# Patient Record
Sex: Female | Born: 1950 | Race: White | Hispanic: No | Marital: Single | Smoking: Former smoker
Health system: Southern US, Community
[De-identification: ages and names within clinical notes are randomized; demographics above are authoritative.]

## PROBLEM LIST (undated history)

## (undated) DIAGNOSIS — R413 Other amnesia: Secondary | ICD-10-CM

## (undated) DIAGNOSIS — I671 Cerebral aneurysm, nonruptured: Secondary | ICD-10-CM

## (undated) DIAGNOSIS — I639 Cerebral infarction, unspecified: Secondary | ICD-10-CM

## (undated) DIAGNOSIS — R443 Hallucinations, unspecified: Secondary | ICD-10-CM

## (undated) DIAGNOSIS — N289 Disorder of kidney and ureter, unspecified: Secondary | ICD-10-CM

## (undated) DIAGNOSIS — E119 Type 2 diabetes mellitus without complications: Secondary | ICD-10-CM

## (undated) DIAGNOSIS — N185 Chronic kidney disease, stage 5: Secondary | ICD-10-CM

## (undated) DIAGNOSIS — N390 Urinary tract infection, site not specified: Secondary | ICD-10-CM

## (undated) HISTORY — PX: CHOLECYSTECTOMY: SHX55

## (undated) HISTORY — PX: SKIN GRAFT: SHX250

---

## 2019-07-20 ENCOUNTER — Emergency Department (HOSPITAL_COMMUNITY): Payer: Medicare Other

## 2019-07-20 ENCOUNTER — Observation Stay (HOSPITAL_COMMUNITY): Payer: Medicare Other

## 2019-07-20 ENCOUNTER — Encounter (HOSPITAL_COMMUNITY): Payer: Self-pay | Admitting: Emergency Medicine

## 2019-07-20 ENCOUNTER — Inpatient Hospital Stay (HOSPITAL_COMMUNITY)
Admission: EM | Admit: 2019-07-20 | Discharge: 2019-07-29 | DRG: 092 | Disposition: A | Discharge status: Skilled Nursing Facility | Payer: Medicare Other | Attending: Internal Medicine | Admitting: Internal Medicine

## 2019-07-20 ENCOUNTER — Other Ambulatory Visit: Payer: Self-pay

## 2019-07-20 DIAGNOSIS — S82401A Unspecified fracture of shaft of right fibula, initial encounter for closed fracture: Secondary | ICD-10-CM

## 2019-07-20 DIAGNOSIS — Z91018 Allergy to other foods: Secondary | ICD-10-CM

## 2019-07-20 DIAGNOSIS — E875 Hyperkalemia: Secondary | ICD-10-CM | POA: Diagnosis present

## 2019-07-20 DIAGNOSIS — W1830XA Fall on same level, unspecified, initial encounter: Secondary | ICD-10-CM | POA: Diagnosis present

## 2019-07-20 DIAGNOSIS — Z8673 Personal history of transient ischemic attack (TIA), and cerebral infarction without residual deficits: Secondary | ICD-10-CM

## 2019-07-20 DIAGNOSIS — S82492A Other fracture of shaft of left fibula, initial encounter for closed fracture: Secondary | ICD-10-CM

## 2019-07-20 DIAGNOSIS — W19XXXA Unspecified fall, initial encounter: Secondary | ICD-10-CM | POA: Diagnosis present

## 2019-07-20 DIAGNOSIS — F0390 Unspecified dementia without behavioral disturbance: Secondary | ICD-10-CM | POA: Diagnosis present

## 2019-07-20 DIAGNOSIS — S82402A Unspecified fracture of shaft of left fibula, initial encounter for closed fracture: Secondary | ICD-10-CM

## 2019-07-20 DIAGNOSIS — Z87891 Personal history of nicotine dependence: Secondary | ICD-10-CM

## 2019-07-20 DIAGNOSIS — Z993 Dependence on wheelchair: Secondary | ICD-10-CM

## 2019-07-20 DIAGNOSIS — N184 Chronic kidney disease, stage 4 (severe): Secondary | ICD-10-CM | POA: Diagnosis present

## 2019-07-20 DIAGNOSIS — E1122 Type 2 diabetes mellitus with diabetic chronic kidney disease: Secondary | ICD-10-CM | POA: Diagnosis present

## 2019-07-20 DIAGNOSIS — E1165 Type 2 diabetes mellitus with hyperglycemia: Secondary | ICD-10-CM | POA: Diagnosis present

## 2019-07-20 DIAGNOSIS — G92 Toxic encephalopathy: Principal | ICD-10-CM | POA: Diagnosis present

## 2019-07-20 DIAGNOSIS — R0902 Hypoxemia: Secondary | ICD-10-CM

## 2019-07-20 DIAGNOSIS — Z794 Long term (current) use of insulin: Secondary | ICD-10-CM

## 2019-07-20 DIAGNOSIS — N189 Chronic kidney disease, unspecified: Secondary | ICD-10-CM

## 2019-07-20 DIAGNOSIS — S82491A Other fracture of shaft of right fibula, initial encounter for closed fracture: Secondary | ICD-10-CM

## 2019-07-20 DIAGNOSIS — E119 Type 2 diabetes mellitus without complications: Secondary | ICD-10-CM

## 2019-07-20 DIAGNOSIS — S82831A Other fracture of upper and lower end of right fibula, initial encounter for closed fracture: Secondary | ICD-10-CM | POA: Diagnosis present

## 2019-07-20 DIAGNOSIS — I639 Cerebral infarction, unspecified: Secondary | ICD-10-CM

## 2019-07-20 DIAGNOSIS — I6930 Unspecified sequelae of cerebral infarction: Secondary | ICD-10-CM

## 2019-07-20 DIAGNOSIS — N183 Chronic kidney disease, stage 3 unspecified: Secondary | ICD-10-CM

## 2019-07-20 DIAGNOSIS — Z91012 Allergy to eggs: Secondary | ICD-10-CM

## 2019-07-20 DIAGNOSIS — N179 Acute kidney failure, unspecified: Secondary | ICD-10-CM | POA: Diagnosis present

## 2019-07-20 DIAGNOSIS — S93409A Sprain of unspecified ligament of unspecified ankle, initial encounter: Secondary | ICD-10-CM

## 2019-07-20 DIAGNOSIS — Z20828 Contact with and (suspected) exposure to other viral communicable diseases: Secondary | ICD-10-CM | POA: Diagnosis present

## 2019-07-20 DIAGNOSIS — Z91011 Allergy to milk products: Secondary | ICD-10-CM

## 2019-07-20 DIAGNOSIS — Z66 Do not resuscitate: Secondary | ICD-10-CM | POA: Diagnosis not present

## 2019-07-20 DIAGNOSIS — Z7902 Long term (current) use of antithrombotics/antiplatelets: Secondary | ICD-10-CM

## 2019-07-20 DIAGNOSIS — Z6836 Body mass index (BMI) 36.0-36.9, adult: Secondary | ICD-10-CM

## 2019-07-20 DIAGNOSIS — I671 Cerebral aneurysm, nonruptured: Secondary | ICD-10-CM

## 2019-07-20 DIAGNOSIS — E46 Unspecified protein-calorie malnutrition: Secondary | ICD-10-CM | POA: Diagnosis present

## 2019-07-20 DIAGNOSIS — Z880 Allergy status to penicillin: Secondary | ICD-10-CM

## 2019-07-20 DIAGNOSIS — Y92014 Private driveway to single-family (private) house as the place of occurrence of the external cause: Secondary | ICD-10-CM

## 2019-07-20 DIAGNOSIS — D631 Anemia in chronic kidney disease: Secondary | ICD-10-CM | POA: Diagnosis present

## 2019-07-20 DIAGNOSIS — Z833 Family history of diabetes mellitus: Secondary | ICD-10-CM

## 2019-07-20 DIAGNOSIS — S82435A Nondisplaced oblique fracture of shaft of left fibula, initial encounter for closed fracture: Secondary | ICD-10-CM | POA: Diagnosis present

## 2019-07-20 DIAGNOSIS — S92102A Unspecified fracture of left talus, initial encounter for closed fracture: Secondary | ICD-10-CM | POA: Diagnosis present

## 2019-07-20 DIAGNOSIS — Z515 Encounter for palliative care: Secondary | ICD-10-CM

## 2019-07-20 DIAGNOSIS — I69311 Memory deficit following cerebral infarction: Secondary | ICD-10-CM

## 2019-07-20 DIAGNOSIS — Z7982 Long term (current) use of aspirin: Secondary | ICD-10-CM

## 2019-07-20 DIAGNOSIS — G9340 Encephalopathy, unspecified: Secondary | ICD-10-CM | POA: Diagnosis present

## 2019-07-20 DIAGNOSIS — Z8249 Family history of ischemic heart disease and other diseases of the circulatory system: Secondary | ICD-10-CM

## 2019-07-20 DIAGNOSIS — R4182 Altered mental status, unspecified: Secondary | ICD-10-CM

## 2019-07-20 DIAGNOSIS — R443 Hallucinations, unspecified: Secondary | ICD-10-CM

## 2019-07-20 DIAGNOSIS — Z7189 Other specified counseling: Secondary | ICD-10-CM

## 2019-07-20 HISTORY — DX: Urinary tract infection, site not specified: N39.0

## 2019-07-20 HISTORY — DX: Cerebral infarction, unspecified: I63.9

## 2019-07-20 HISTORY — DX: Disorder of kidney and ureter, unspecified: N28.9

## 2019-07-20 HISTORY — DX: Type 2 diabetes mellitus without complications: E11.9

## 2019-07-20 HISTORY — DX: Hallucinations, unspecified: R44.3

## 2019-07-20 HISTORY — DX: Cerebral aneurysm, nonruptured: I67.1

## 2019-07-20 HISTORY — DX: Other amnesia: R41.3

## 2019-07-20 LAB — CBC WITH DIFFERENTIAL/PLATELET
Abs Immature Granulocytes: 0.09 10*3/uL — ABNORMAL HIGH (ref 0.00–0.07)
Basophils Absolute: 0.1 10*3/uL (ref 0.0–0.1)
Basophils Relative: 0 %
Eosinophils Absolute: 0.6 10*3/uL — ABNORMAL HIGH (ref 0.0–0.5)
Eosinophils Relative: 5 %
HCT: 33.7 % — ABNORMAL LOW (ref 36.0–46.0)
Hemoglobin: 10.5 g/dL — ABNORMAL LOW (ref 12.0–15.0)
Immature Granulocytes: 1 %
Lymphocytes Relative: 24 %
Lymphs Abs: 3.2 10*3/uL (ref 0.7–4.0)
MCH: 27.7 pg (ref 26.0–34.0)
MCHC: 31.2 g/dL (ref 30.0–36.0)
MCV: 88.9 fL (ref 80.0–100.0)
Monocytes Absolute: 1.1 10*3/uL — ABNORMAL HIGH (ref 0.1–1.0)
Monocytes Relative: 8 %
Neutro Abs: 8.3 10*3/uL — ABNORMAL HIGH (ref 1.7–7.7)
Neutrophils Relative %: 62 %
Platelets: 191 10*3/uL (ref 150–400)
RBC: 3.79 MIL/uL — ABNORMAL LOW (ref 3.87–5.11)
RDW: 14.1 % (ref 11.5–15.5)
WBC: 13.4 10*3/uL — ABNORMAL HIGH (ref 4.0–10.5)
nRBC: 0 % (ref 0.0–0.2)

## 2019-07-20 LAB — URINALYSIS, ROUTINE W REFLEX MICROSCOPIC
Bacteria, UA: NONE SEEN
Bilirubin Urine: NEGATIVE
Glucose, UA: NEGATIVE mg/dL
Ketones, ur: NEGATIVE mg/dL
Leukocytes,Ua: NEGATIVE
Nitrite: NEGATIVE
Protein, ur: NEGATIVE mg/dL
Specific Gravity, Urine: 1.009 (ref 1.005–1.030)
pH: 6 (ref 5.0–8.0)

## 2019-07-20 LAB — BRAIN NATRIURETIC PEPTIDE: B Natriuretic Peptide: 109 pg/mL — ABNORMAL HIGH (ref 0.0–100.0)

## 2019-07-20 LAB — COMPREHENSIVE METABOLIC PANEL
ALT: 19 U/L (ref 0–44)
AST: 20 U/L (ref 15–41)
Albumin: 3.3 g/dL — ABNORMAL LOW (ref 3.5–5.0)
Alkaline Phosphatase: 82 U/L (ref 38–126)
Anion gap: 10 (ref 5–15)
BUN: 63 mg/dL — ABNORMAL HIGH (ref 8–23)
CO2: 26 mmol/L (ref 22–32)
Calcium: 8.2 mg/dL — ABNORMAL LOW (ref 8.9–10.3)
Chloride: 104 mmol/L (ref 98–111)
Creatinine, Ser: 3.26 mg/dL — ABNORMAL HIGH (ref 0.44–1.00)
GFR calc Af Amer: 16 mL/min — ABNORMAL LOW (ref >60)
GFR calc non Af Amer: 14 mL/min — ABNORMAL LOW (ref >60)
Glucose, Bld: 192 mg/dL — ABNORMAL HIGH (ref 70–99)
Potassium: 6 mmol/L — ABNORMAL HIGH (ref 3.5–5.1)
Sodium: 140 mmol/L (ref 135–145)
Total Bilirubin: 0.6 mg/dL (ref 0.3–1.2)
Total Protein: 6.4 g/dL — ABNORMAL LOW (ref 6.5–8.1)

## 2019-07-20 LAB — CK: Total CK: 271 U/L — ABNORMAL HIGH (ref 38–234)

## 2019-07-20 LAB — POTASSIUM: Potassium: 5.7 mmol/L — ABNORMAL HIGH (ref 3.5–5.1)

## 2019-07-20 LAB — TROPONIN I (HIGH SENSITIVITY)
Troponin I (High Sensitivity): 4 ng/L (ref <18)
Troponin I (High Sensitivity): 4 ng/L (ref <18)

## 2019-07-20 LAB — SARS CORONAVIRUS 2 BY RT PCR (HOSPITAL ORDER, PERFORMED IN ~~LOC~~ HOSPITAL LAB): SARS Coronavirus 2: NEGATIVE

## 2019-07-20 LAB — CBG MONITORING, ED: Glucose-Capillary: 131 mg/dL — ABNORMAL HIGH (ref 70–99)

## 2019-07-20 MED ORDER — CRANBERRY-VITAMIN C-VITAMIN E 4200-20-3 MG-MG-UNIT PO CAPS: 1.0000 | ORAL_CAPSULE | Freq: Every day | ORAL | Status: DC | PRN

## 2019-07-20 MED ORDER — ACETAMINOPHEN 650 MG RE SUPP: 650.0000 mg | Freq: Four times a day (QID) | RECTAL | Status: DC | PRN

## 2019-07-20 MED ORDER — LORATADINE 10 MG PO TABS
10.0000 mg | ORAL_TABLET | Freq: Every morning | ORAL | Status: DC
  Administered 2019-07-21 – 2019-07-28 (×6): 10 mg via ORAL
  Filled 2019-07-20 (×9): qty 1

## 2019-07-20 MED ORDER — ASPIRIN EC 81 MG PO TBEC
81.0000 mg | DELAYED_RELEASE_TABLET | Freq: Every morning | ORAL | Status: DC
  Administered 2019-07-21 – 2019-07-24 (×3): 81 mg via ORAL
  Filled 2019-07-20 (×5): qty 1

## 2019-07-20 MED ORDER — POLYETHYLENE GLYCOL 3350 17 G PO PACK: 17.0000 g | PACK | Freq: Every day | ORAL | Status: DC | PRN

## 2019-07-20 MED ORDER — DEXTROSE 50 % IV SOLN
INTRAVENOUS | Status: AC
  Administered 2019-07-20: 19:00:00 via INTRAVENOUS
  Filled 2019-07-20: qty 50

## 2019-07-20 MED ORDER — DOXYCYCLINE HYCLATE 100 MG PO TABS
100.0000 mg | ORAL_TABLET | Freq: Every morning | ORAL | Status: DC
  Administered 2019-07-21 – 2019-07-28 (×6): 100 mg via ORAL
  Filled 2019-07-20 (×9): qty 1

## 2019-07-20 MED ORDER — ENOXAPARIN SODIUM 60 MG/0.6ML ~~LOC~~ SOLN
50.0000 mg | SUBCUTANEOUS | Status: DC
  Administered 2019-07-21: 50 mg via SUBCUTANEOUS
  Filled 2019-07-20: qty 0.6

## 2019-07-20 MED ORDER — METOPROLOL SUCCINATE ER 50 MG PO TB24
200.0000 mg | ORAL_TABLET | Freq: Every morning | ORAL | Status: DC
  Administered 2019-07-21 – 2019-07-28 (×6): 200 mg via ORAL
  Filled 2019-07-20 (×10): qty 4

## 2019-07-20 MED ORDER — CLONIDINE HCL 0.2 MG/24HR TD PTWK
0.2000 mg | MEDICATED_PATCH | TRANSDERMAL | Status: DC
  Filled 2019-07-20 (×2): qty 1

## 2019-07-20 MED ORDER — MORPHINE SULFATE (PF) 2 MG/ML IV SOLN
2.0000 mg | Freq: Four times a day (QID) | INTRAVENOUS | Status: DC | PRN
  Administered 2019-07-21 – 2019-07-22 (×2): 2 mg via INTRAVENOUS
  Filled 2019-07-20 (×2): qty 1

## 2019-07-20 MED ORDER — DEXTROSE 50 % IV SOLN
50.0000 mL | Freq: Once | INTRAVENOUS | Status: AC
  Administered 2019-07-20: 18:00:00 50 mL via INTRAVENOUS
  Administered 2019-07-20: 19:00:00 via INTRAVENOUS

## 2019-07-20 MED ORDER — INSULIN GLARGINE 100 UNIT/ML ~~LOC~~ SOLN
25.0000 [IU] | Freq: Every day | SUBCUTANEOUS | Status: DC
  Administered 2019-07-21: 25 [IU] via SUBCUTANEOUS
  Filled 2019-07-20 (×4): qty 0.25

## 2019-07-20 MED ORDER — CLOPIDOGREL BISULFATE 75 MG PO TABS
75.0000 mg | ORAL_TABLET | Freq: Every morning | ORAL | Status: DC
  Administered 2019-07-21 – 2019-07-24 (×3): 75 mg via ORAL
  Filled 2019-07-20 (×5): qty 1

## 2019-07-20 MED ORDER — INSULIN ASPART 100 UNIT/ML ~~LOC~~ SOLN
0.0000 [IU] | Freq: Three times a day (TID) | SUBCUTANEOUS | Status: DC
  Administered 2019-07-22 (×2): 2 [IU] via SUBCUTANEOUS
  Administered 2019-07-22: 17:00:00 5 [IU] via SUBCUTANEOUS
  Administered 2019-07-24: 10:00:00 3 [IU] via SUBCUTANEOUS
  Administered 2019-07-25: 12:00:00 11 [IU] via SUBCUTANEOUS
  Administered 2019-07-25 – 2019-07-26 (×2): 5 [IU] via SUBCUTANEOUS
  Administered 2019-07-26: 08:00:00 8 [IU] via SUBCUTANEOUS
  Administered 2019-07-26: 13:00:00 5 [IU] via SUBCUTANEOUS
  Administered 2019-07-27: 12:00:00 3 [IU] via SUBCUTANEOUS
  Administered 2019-07-27: 08:00:00 5 [IU] via SUBCUTANEOUS
  Administered 2019-07-27 – 2019-07-28 (×3): 3 [IU] via SUBCUTANEOUS
  Administered 2019-07-29 (×2): 2 [IU] via SUBCUTANEOUS

## 2019-07-20 MED ORDER — SODIUM ZIRCONIUM CYCLOSILICATE 5 G PO PACK
10.0000 g | PACK | Freq: Once | ORAL | Status: AC
  Administered 2019-07-20: 10 g via ORAL
  Filled 2019-07-20: qty 2

## 2019-07-20 MED ORDER — INSULIN ASPART 100 UNIT/ML ~~LOC~~ SOLN
10.0000 [IU] | Freq: Once | SUBCUTANEOUS | Status: AC
  Administered 2019-07-20: 19:00:00 10 [IU] via SUBCUTANEOUS
  Filled 2019-07-20: qty 1

## 2019-07-20 MED ORDER — SODIUM CHLORIDE 0.9 % IV SOLN
INTRAVENOUS | Status: AC
  Administered 2019-07-20: 19:00:00 via INTRAVENOUS

## 2019-07-20 MED ORDER — ATORVASTATIN CALCIUM 40 MG PO TABS
40.0000 mg | ORAL_TABLET | Freq: Every day | ORAL | Status: DC
  Administered 2019-07-21 – 2019-07-28 (×6): 40 mg via ORAL
  Filled 2019-07-20 (×8): qty 1

## 2019-07-20 MED ORDER — PANTOPRAZOLE SODIUM 40 MG PO TBEC
40.0000 mg | DELAYED_RELEASE_TABLET | Freq: Every day | ORAL | Status: DC
  Administered 2019-07-21 – 2019-07-28 (×6): 40 mg via ORAL
  Filled 2019-07-20 (×9): qty 1

## 2019-07-20 MED ORDER — ONDANSETRON HCL 4 MG/2ML IJ SOLN
4.0000 mg | Freq: Four times a day (QID) | INTRAMUSCULAR | Status: DC | PRN
  Administered 2019-07-25 – 2019-07-27 (×2): 4 mg via INTRAVENOUS
  Filled 2019-07-20 (×2): qty 2

## 2019-07-20 MED ORDER — ISOSORBIDE MONONITRATE ER 60 MG PO TB24
30.0000 mg | ORAL_TABLET | Freq: Every morning | ORAL | Status: DC
  Administered 2019-07-21 – 2019-07-28 (×6): 30 mg via ORAL
  Filled 2019-07-20 (×9): qty 1

## 2019-07-20 MED ORDER — LINAGLIPTIN 5 MG PO TABS
5.0000 mg | ORAL_TABLET | Freq: Every morning | ORAL | Status: DC
  Administered 2019-07-21 – 2019-07-28 (×6): 5 mg via ORAL
  Filled 2019-07-20 (×10): qty 1

## 2019-07-20 MED ORDER — ACETAMINOPHEN 325 MG PO TABS
650.0000 mg | ORAL_TABLET | Freq: Four times a day (QID) | ORAL | Status: DC | PRN
  Administered 2019-07-26: 650 mg via ORAL
  Filled 2019-07-20 (×2): qty 2

## 2019-07-20 MED ORDER — CALCIUM GLUCONATE-NACL 1-0.675 GM/50ML-% IV SOLN
1.0000 g | Freq: Once | INTRAVENOUS | Status: AC
  Administered 2019-07-20: 19:00:00 1000 mg via INTRAVENOUS
  Filled 2019-07-20: qty 50

## 2019-07-20 MED ORDER — ONDANSETRON HCL 4 MG PO TABS: 4.0000 mg | ORAL_TABLET | Freq: Four times a day (QID) | ORAL | Status: DC | PRN

## 2019-07-20 NOTE — ED Provider Notes (Signed)
[facility] Provider Note   CSN: [id number] Arrival date & time: 07/20/19  1318     History   Chief Complaint Chief Complaint  Patient presents with  . Fall    HPI Victoria Winters is a 68 y.o. female.     The history is provided by the patient and a relative. No language interpreter was used.  Fall This is a new problem. The current episode started yesterday. The problem occurs constantly. The problem has been gradually worsening. Pertinent negatives include no headaches. Nothing aggravates the symptoms. Nothing relieves the symptoms. She has tried nothing for the symptoms. The treatment provided no relief.  Pt's sister is caregiver.  Pt has had a stroke and has short term memory loss. Pt fell yesterday.  Pt has been more confused,  Talking less since fall,   Past Medical History:  Diagnosis Date  . Brain aneurysm   . Diabetes mellitus without complication ([place])   . Hallucinations    visual and auditory  . Memory loss   . Recurrent UTI   . Renal disorder   . Stroke [facility])     There are no active problems to display for this patient.   Past Surgical History:  Procedure Laterality Date  . CHOLECYSTECTOMY    . SKIN GRAFT       OB History    Gravida  2   Para  2   Term  2   Preterm      AB      Living        SAB      TAB      Ectopic      Multiple      Live Births               Home Medications    Prior to Admission medications   Not on File    Family History Family History  Problem Relation Age of Onset  . Heart disease Mother   . Diabetes Mellitus II Mother   . Heart disease Father   . Diabetes Father   . Heart disease Brother   . Cancer Other     Social History Social History   Tobacco Use  . Smoking status: Former Research scientist (life sciences)  . Smokeless tobacco: Never Used  Substance Use Topics  . Alcohol use: Yes    Frequency: Never    Comment: rarely  . Drug use: Never     Allergies   Penicillins, Chicken  allergy, Eggs or egg-derived products, Milk-related compounds, Other, and Wheat bran   Review of Systems Review of Systems  Musculoskeletal: Positive for joint swelling and myalgias.  Neurological: Negative for headaches.  All other systems reviewed and are negative.    Physical Exam Updated Vital Signs BP (!) 112/51 (BP Location: Right Arm)   Pulse 66   Temp 98 F (36.7 C) (Oral)   Resp 18   SpO2 90%   Physical Exam Vitals signs and nursing note reviewed.  HENT:     Head: Normocephalic.     Right Ear: Tympanic membrane normal.     Left Ear: Tympanic membrane normal.     Mouth/Throat:     Mouth: Mucous membranes are moist.  Eyes:     Pupils: Pupils are equal, round, and reactive to light.  Cardiovascular:     Rate and Rhythm: Normal rate.     Pulses: Normal pulses.  Pulmonary:     Effort: Pulmonary effort is normal.  Abdominal:  General: Abdomen is flat.  Musculoskeletal:        General: Swelling and tenderness present.     Comments: Tender bilat knees, pain with movement   Skin:    General: Skin is warm.  Neurological:     General: No focal deficit present.     Mental Status: She is alert.  Psychiatric:        Mood and Affect: Mood normal.      ED Treatments / Results  Labs (all labs ordered are listed, but only abnormal results are displayed) Labs Reviewed  CBC WITH DIFFERENTIAL/PLATELET - Abnormal; Notable for the following components:      Result Value   WBC 13.4 (*)    RBC 3.79 (*)    Hemoglobin 10.5 (*)    HCT 33.7 (*)    Neutro Abs 8.3 (*)    Monocytes Absolute 1.1 (*)    Eosinophils Absolute 0.6 (*)    Abs Immature Granulocytes 0.09 (*)    All other components within normal limits  COMPREHENSIVE METABOLIC PANEL - Abnormal; Notable for the following components:   Potassium 6.0 (*)    Glucose, Bld 192 (*)    BUN 63 (*)    Creatinine, Ser 3.26 (*)    Calcium 8.2 (*)    Total Protein 6.4 (*)    Albumin 3.3 (*)    GFR calc non Af Amer 14  (*)    GFR calc Af Amer 16 (*)    All other components within normal limits  URINALYSIS, ROUTINE W REFLEX MICROSCOPIC  TROPONIN I (HIGH SENSITIVITY)    EKG None  Radiology No results found.  Procedures Procedures (including critical care time)  Medications Ordered in ED Medications - No data to display   Initial Impression / Assessment and Plan / ED Course  I have reviewed the triage vital signs and the nursing notes.  Pertinent labs & imaging results that were available during my care of the patient were reviewed by me and considered in my medical decision making (see chart for details).        MDM  I will xray pt knee and obtain ct scan.  Labs ordered. Pt has bilat fibula fractures.  BUN, Creat and potassium are elevated.   Ct shows chronic changes, no changes Right fibula neck fracture  Left fibula shaft fracture. I counseled sister on results.  She reports pt seems even more spacey  Sister reports she can not lift pt at home.  I spoke to Dr. Denton Brick who will see for admissionn  Final Clinical Impressions(s) / ED Diagnoses   Final diagnoses:  Hyperkalemia  AKI (acute kidney injury) ([place])  Other closed fracture of shaft of right fibula, initial encounter  Other closed fracture of shaft of left fibula, initial encounter  Altered mental status, unspecified altered mental status type    ED Discharge Orders    None       Fransico Meadow, [place] 07/20/19 1749    Lucrezia Starch, MD 07/21/19 [phone]

## 2019-07-20 NOTE — H&P (Addendum)
History and Physical    Victoria Winters [id number] DOB: 10-Jan-1951 DOA: 07/20/2019  PCP: [facility]   Patient coming from: Home  I have personally briefly reviewed patient's old medical records in [place]  Chief Complaint: Fall, altered mental status  HPI: Victoria Winters is a 68 y.o. female with medical history significant for CVA with residual deficits, diabetes mellitus, brain aneurysms, hallucinations, frequent UTIs, CKD 4-5.  History is obtained with the help of sister-Victoria Winters who is healthcare power of attorney.  The patient is awake and alert, has short-term memory loss due to her stroke and is unable to give me history, but answers simple questions.  Patient lives with her sister. Over the past 4 days, patient sister reports increased shakiness, patient sometimes not responding to questions, not as lucid as previously or as talkative.  Patient also reported blurred pain with urination 4 days ago.   Yesterday while patient was using her walker and trying to go up the ramp into the house, patient became tremulous and fell.  Since fall patient has not been able to ambulate.  At baseline patient ambulates with walker and sometimes wheelchair. No vomiting, no loose stools, patient admitted good p.o. intake, no difficulty breathing, no cough no chest pain, no fever no chills..  Sister reports wound to patient's buttock area.  Patient sister also reports patient has an appointment (1st) to see a nephrologist but because of [employer], her  appointment date and change several times and is now in September.  ED Course: 89 to 90% on room air.  Creatinine 3.26, potassium 6.  WBC 13.4.  Right knee xray-slight impacted fracture fibular neck, left knee x-ray proximal shaft fibular fracture..  Head CT shows remote infarct with volume loss and chronic white matter changes.  EKG showed sinus rhythm.  Hospitalist was called to admit for falls and hyperkalemia.  Review of  Systems: As per HPI all other systems reviewed and negative.  Past Medical History:  Diagnosis Date   Brain aneurysm    Diabetes mellitus without complication ([place])    Hallucinations    visual and auditory   Memory loss    Recurrent UTI    Renal disorder    Stroke [facility])     Past Surgical History:  Procedure Laterality Date   CHOLECYSTECTOMY     SKIN GRAFT       reports that she has quit smoking. She has never used smokeless tobacco. She reports current alcohol use. She reports that she does not use drugs.  Allergies  Allergen Reactions   Penicillins Anaphylaxis    Did it involve swelling of the face/tongue/throat, SOB, or low BP? Yes Did it involve sudden or severe rash/hives, skin peeling, or any reaction on the inside of your mouth or nose? Unknown Did you need to seek medical attention at a hospital or doctor's office? Unknown When did it last happen?Within past 10 years If all above answers are "NO", may proceed with cephalosporin use.    Chicken Allergy    Eggs Or Egg-Derived Products Swelling   Milk-Related Compounds    Other     Black beans, cats, dogs, grass clippings   Wheat Bran     Family History  Problem Relation Age of Onset   Heart disease Mother    Diabetes Mellitus II Mother    Heart disease Father    Diabetes Father    Heart disease Brother    Cancer Other     Prior  to Admission medications   Medication Sig Start Date End Date Taking? Authorizing Provider  acetaminophen-codeine (TYLENOL #4) 300-60 MG tablet Take 1 tablet by mouth every 4 (four) hours as needed for moderate pain.   Yes [provider]  aspirin EC 81 MG tablet Take 81 mg by mouth every morning.   Yes [provider]  atorvastatin (LIPITOR) 40 MG tablet Take 40 mg by mouth at bedtime.   Yes [provider]  cloNIDine (CATAPRES - DOSED IN MG/24 HR) 0.2 mg/24hr patch Place 0.2 mg onto the skin every Sunday.   Yes [provider]  clopidogrel (PLAVIX) 75 MG tablet Take 75 mg by mouth every morning.   Yes [provider]  Cranberry-Vitamin C-Vitamin E (CRANBERRY PLUS VITAMIN C) 4200-20-3 MG-MG-UNIT CAPS Take 1 capsule by mouth daily as needed (urinary tract health).   Yes [provider]  doxycycline (VIBRA-TABS) 100 MG tablet Take 100 mg by mouth every morning.   Yes [provider]  escitalopram (LEXAPRO) 10 MG tablet Take 10 mg by mouth every morning.   Yes [provider]  furosemide (LASIX) 40 MG tablet Take 40 mg by mouth every morning.   Yes [provider]  hydrALAZINE (APRESOLINE) 25 MG tablet Take 25 mg by mouth 3 (three) times daily.   Yes [provider]  insulin glargine (LANTUS) 100 UNIT/ML injection Inject 30 Units into the skin at bedtime.   Yes [provider]  insulin lispro (HUMALOG) 100 UNIT/ML injection Inject 1-17 Units into the skin 3 (three) times daily before meals. Sliding scale starting at; 180-220= 4 units 221-260= 6 units 261-300= 8 units 301-340=10units 341-380=12units   Yes [provider]  isosorbide mononitrate (IMDUR) 30 MG 24 hr tablet Take 30 mg by mouth every morning.   Yes [provider]  linagliptin (TRADJENTA) 5 MG TABS tablet Take 5 mg by mouth every morning.   Yes [provider]  loratadine (CLARITIN) 10 MG tablet Take 10 mg by mouth every morning.   Yes [provider]  Melatonin 1 MG TABS Take 1 mg by mouth at bedtime.   Yes [provider]  metoprolol (TOPROL-XL) 200 MG 24 hr tablet Take 200 mg by mouth every morning.   Yes [provider]  Multiple Vitamin (MULTIVITAMIN WITH MINERALS) TABS tablet Take 1 tablet by mouth daily.   Yes [provider]  omeprazole (PRILOSEC) 20 MG capsule Take 20 mg by mouth every morning.   Yes [provider]  pregabalin (LYRICA) 75 MG capsule Take 75 mg by mouth 2 (two) times daily.   Yes [provider]    Physical Exam: Vitals:   07/20/19 1330 07/20/19 1331  BP:  (!) 112/51  Pulse:  66  Resp:  18  Temp:  98 F (36.7 C)  TempSrc:  Oral  SpO2: (!) 89% 90%    Constitutional: NAD, calm, comfortable Vitals:   07/20/19 1330 07/20/19 1331  BP:  (!) 112/51  Pulse:  66  Resp:  18  Temp:  98 F (36.7 C)  TempSrc:  Oral  SpO2: (!) 89% 90%   Eyes: PERRL, lids and conjunctivae normal ENMT: Mucous membranes are moist. Posterior pharynx clear of any exudate or lesions.  Neck: normal, supple, no masses, no thyromegaly Respiratory: clear to auscultation bilaterally, no wheezing, no crackles. Normal respiratory effort. No accessory muscle use.  Cardiovascular: Regular rate and rhythm, no murmurs / rubs / gallops. No extremity edema. 2+ pedal pulses.  Abdomen: Protuberant, but nontender, per sister this is her baseline, no masses palpated. No hepatosplenomegaly. Bowel sounds positive.  On my limited examination, minimal erythema on right and left buttock. Musculoskeletal: no clubbing / cyanosis. No joint deformity upper and lower extremities. Good ROM, no contractures. Normal muscle tone.  Skin: Abrasion to right knee, no rashes, lesions, ulcers. No induration Neurologic: CN 2-12 grossly intact. Strength 5/5 lateral upper extremity, lower extremity testing limited by bilateral fibular fractures.  But able to move her foot slightly bilaterally Psychiatric: Normal judgment and insight. Alert and oriented x 3. Normal mood.   Labs on Admission: I have personally reviewed following labs and imaging studies  CBC: Recent Labs  Lab 07/20/19 1353  WBC 13.4*  NEUTROABS 8.3*  HGB 10.5*  HCT 33.7*  MCV 88.9  PLT 99991111   Basic Metabolic Panel: Recent Labs  Lab 07/20/19 1353  NA 140  K 6.0*  CL 104  CO2 26  GLUCOSE 192*  BUN 63*  CREATININE 3.26*  CALCIUM 8.2*   Liver Function Tests: Recent Labs  Lab 07/20/19 1353  AST 20  ALT 19  ALKPHOS 82  BILITOT 0.6  PROT  6.4*  ALBUMIN 3.3*   Urine analysis:    Component Value Date/Time   COLORURINE YELLOW 07/20/2019 1353   APPEARANCEUR HAZY (A) 07/20/2019 1353   LABSPEC 1.009 07/20/2019 1353   PHURINE 6.0 07/20/2019 1353   GLUCOSEU NEGATIVE 07/20/2019 1353   HGBUR MODERATE (A) 07/20/2019 1353   BILIRUBINUR NEGATIVE 07/20/2019 1353   KETONESUR NEGATIVE 07/20/2019 1353   PROTEINUR NEGATIVE 07/20/2019 1353   NITRITE NEGATIVE 07/20/2019 1353   LEUKOCYTESUR NEGATIVE 07/20/2019 1353    Radiological Exams on Admission: Ct Head Wo Contrast  Result Date: 07/20/2019 CLINICAL DATA:  Ataxia, fall, history of strokes EXAM: CT HEAD WITHOUT CONTRAST TECHNIQUE: Contiguous axial images were obtained from the base of the skull through the vertex without intravenous contrast. COMPARISON:  None. FINDINGS: Brain: Focus of gliosis in the left cerebellar hemisphere likely reflecting remote infarct no evidence of acute infarction, hemorrhage, hydrocephalus, extra-axial collection or mass lesion/mass effect. Symmetric prominence of the ventricles, cisterns and sulci compatible with parenchymal volume loss. Patchy areas of white matter hypoattenuation are most compatible with chronic microvascular angiopathy. Vascular: Atherosclerotic calcification of the carotid siphons. Skull: Hyperostosis frontalis interna. No calvarial fracture or suspicious osseous lesion. No scalp swelling or hematoma. Sinuses/Orbits: Mural thickening in the right frontal sinus and anterior right ethmoid air cells. Orbital structures are unremarkable aside from prior lens extractions. Other: None. IMPRESSION: No acute intracranial abnormality. Region of gliosis in the left cerebellar hemisphere, likely remote infarct. Volume loss and chronic white matter changes. Electronically Signed   By: Lovena Le M.D.   On: 07/20/2019 16:31   Dg Knee Complete 4 Views Left  Result Date: 07/20/2019 CLINICAL DATA:  Fall with knee pain EXAM: LEFT KNEE - COMPLETE 4+ VIEW  COMPARISON:  None. FINDINGS: Mild degenerative change of the medial joint space. Mild patellofemoral degenerative change. Small knee effusion. Acute minimally displaced fracture involving the proximal shaft of the fibula. IMPRESSION: 1. Acute minimally displaced fracture proximal shaft of fibula 2. Mild degenerative changes of the knee.  Small knee effusion Electronically Signed   By: Donavan Foil M.D.   On: 07/20/2019 15:23   Dg Knee Complete 4 Views Right  Result Date: 07/20/2019 CLINICAL DATA:  Fall EXAM: RIGHT KNEE - COMPLETE 4+ VIEW COMPARISON:  None. FINDINGS: Possible acute, subtle slightly impacted fracture at the fibular neck. No  dislocation. Mild patellofemoral and lateral joint space degenerative change. Trace knee effusion IMPRESSION: 1. Findings questionable for slightly impacted fracture at the fibular neck 2. Mild degenerative changes with trace knee effusion Electronically Signed   By: Donavan Foil M.D.   On: 07/20/2019 15:22    EKG: Independently reviewed.  Sinus rhythm, rate 67, QTc 453.  T wave abnormalities lead III, V2 and V3.  Assessment/Plan Active Problems:   Fall    Mechanical fall- at baseline ambulates with walker or wheelchair, residual CVA deficits.  Head CT negative for acute abnormality. -PT evaluation -Obtain pelvic x-ray-no acute fracture or dislocation.  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  Sister reports recently told patient's renal function is between 4 and 5.  Home medications Lasix 40 mg daily.  No GI blood loss history, appears euvolemic.  Denies NSAID use.  Has appointment with nephrology as outpatient. - Trial of IV fluids N/s 100cc/hr x 15hrs - Hold home Lasix - Obtain CK  - BMP a.m. - Ca gluconate x1 - NovoLog 10 units, D50 X 1 - Lokelma 10 mg x 1  Fibular fractures- s/p mechanical fall.  Left Knee xray- Acute minimally displaced proximal fibula fracture of the shaft, Right Knee xray-  questionable slightly impacted fracture at the fibular neck.  At baseline ambulates with walker or wheelchair. - Morphine 2mg  PRN - Ortho recommendations- recommends getting bilateral tibia-fibula x-rays as he cannot tell much from the knee x-rays.  Imaging, there is no acute osseous abnormality on the right.  But the left shows a nondisplaced oblique fracture of the proximal fibular diaphysis.  Called back by Dr. Lyla Glassing after imaging results- no surgical intervention. -Please consult Ortho in a.m. for discharge recommendations as per weightbearing.  Metabolic encephalopathy- Sister reports patient not as lucid, or verbal.  On my evaluation patient answers simple questions appropriately.  Head CT without acute abnormality.  Reported dysuria, but UA is not suggestive of infection.  Leukocytosis of 13.4.  She does have a mild hypoxia O2 sats 89 to 94% on room air, without respiratory symptoms. -Portable chest x-ray-cardiomegaly with vascular congestion -Check BNP -PRN morphine -Will hold off on home psychoactive medications-Lexapro and Lyrica.  Diabetes mellitus-random glucose 192. -Resume home Lantus at reduced dose 25 units daily -Resume home Tradjenta - SSI  Hx of CVA, brain aneurysm, history of hallucinations- history of brain aneurysm not amenable to surgery.  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  Intermittent visual and auditory hallucinations. -Resume home statins, aspirin and Plavix.  Hypertension-blood pressure systolic [id number] - [id number].. -Resume home Imdur, metoprolol, clonidine patch 0.2 mg. -Hold hydralazine, lasix for now.  HIV as part of routine health screening   DVT prophylaxis: SCDs Code Status: Full Family Communication: Sister Victoria Winters at bedside who is healthcare power of attorney. Disposition Plan: Per rounding team Consults called: None Admission status: Observation, telemetry   Hecker MD [facility]  07/20/2019, 9:19 PM

## 2019-07-20 NOTE — ED Triage Notes (Signed)
Reports patient has had some mental status changes over the past couple of days. Fell yesterday, injuring her legs. Abrasion to her R knee. Patient having difficulty standing, unable to ambulate. C/O bilat leg pain. Difficulty getting in w/c and getting out of bed.

## 2019-07-21 ENCOUNTER — Inpatient Hospital Stay (HOSPITAL_COMMUNITY)
Admit: 2019-07-21 | Discharge: 2019-07-21 | Disposition: A | Discharge status: Home / Self Care | Payer: Medicare Other | Attending: Internal Medicine | Admitting: Internal Medicine

## 2019-07-21 ENCOUNTER — Observation Stay (HOSPITAL_COMMUNITY): Payer: Medicare Other

## 2019-07-21 ENCOUNTER — Other Ambulatory Visit: Payer: Self-pay

## 2019-07-21 DIAGNOSIS — S82831A Other fracture of upper and lower end of right fibula, initial encounter for closed fracture: Secondary | ICD-10-CM | POA: Diagnosis present

## 2019-07-21 DIAGNOSIS — E1165 Type 2 diabetes mellitus with hyperglycemia: Secondary | ICD-10-CM | POA: Diagnosis present

## 2019-07-21 DIAGNOSIS — E875 Hyperkalemia: Secondary | ICD-10-CM | POA: Diagnosis present

## 2019-07-21 DIAGNOSIS — Z87891 Personal history of nicotine dependence: Secondary | ICD-10-CM | POA: Diagnosis not present

## 2019-07-21 DIAGNOSIS — R4182 Altered mental status, unspecified: Secondary | ICD-10-CM | POA: Diagnosis present

## 2019-07-21 DIAGNOSIS — W1830XA Fall on same level, unspecified, initial encounter: Secondary | ICD-10-CM | POA: Diagnosis present

## 2019-07-21 DIAGNOSIS — Z91011 Allergy to milk products: Secondary | ICD-10-CM | POA: Diagnosis not present

## 2019-07-21 DIAGNOSIS — Z8673 Personal history of transient ischemic attack (TIA), and cerebral infarction without residual deficits: Secondary | ICD-10-CM | POA: Diagnosis not present

## 2019-07-21 DIAGNOSIS — Z91012 Allergy to eggs: Secondary | ICD-10-CM | POA: Diagnosis not present

## 2019-07-21 DIAGNOSIS — Y92014 Private driveway to single-family (private) house as the place of occurrence of the external cause: Secondary | ICD-10-CM | POA: Diagnosis not present

## 2019-07-21 DIAGNOSIS — G9340 Encephalopathy, unspecified: Secondary | ICD-10-CM | POA: Diagnosis not present

## 2019-07-21 DIAGNOSIS — I6930 Unspecified sequelae of cerebral infarction: Secondary | ICD-10-CM | POA: Diagnosis not present

## 2019-07-21 DIAGNOSIS — Z515 Encounter for palliative care: Secondary | ICD-10-CM | POA: Diagnosis not present

## 2019-07-21 DIAGNOSIS — Z8249 Family history of ischemic heart disease and other diseases of the circulatory system: Secondary | ICD-10-CM | POA: Diagnosis not present

## 2019-07-21 DIAGNOSIS — Z91018 Allergy to other foods: Secondary | ICD-10-CM | POA: Diagnosis not present

## 2019-07-21 DIAGNOSIS — E1122 Type 2 diabetes mellitus with diabetic chronic kidney disease: Secondary | ICD-10-CM | POA: Diagnosis present

## 2019-07-21 DIAGNOSIS — Z7902 Long term (current) use of antithrombotics/antiplatelets: Secondary | ICD-10-CM | POA: Diagnosis not present

## 2019-07-21 DIAGNOSIS — S82435A Nondisplaced oblique fracture of shaft of left fibula, initial encounter for closed fracture: Secondary | ICD-10-CM | POA: Diagnosis present

## 2019-07-21 DIAGNOSIS — E46 Unspecified protein-calorie malnutrition: Secondary | ICD-10-CM | POA: Diagnosis present

## 2019-07-21 DIAGNOSIS — Z7189 Other specified counseling: Secondary | ICD-10-CM | POA: Diagnosis not present

## 2019-07-21 DIAGNOSIS — I69311 Memory deficit following cerebral infarction: Secondary | ICD-10-CM | POA: Diagnosis not present

## 2019-07-21 DIAGNOSIS — Z7982 Long term (current) use of aspirin: Secondary | ICD-10-CM | POA: Diagnosis not present

## 2019-07-21 DIAGNOSIS — Z20828 Contact with and (suspected) exposure to other viral communicable diseases: Secondary | ICD-10-CM | POA: Diagnosis present

## 2019-07-21 DIAGNOSIS — N184 Chronic kidney disease, stage 4 (severe): Secondary | ICD-10-CM | POA: Diagnosis present

## 2019-07-21 DIAGNOSIS — Z880 Allergy status to penicillin: Secondary | ICD-10-CM | POA: Diagnosis not present

## 2019-07-21 DIAGNOSIS — G92 Toxic encephalopathy: Secondary | ICD-10-CM | POA: Diagnosis present

## 2019-07-21 DIAGNOSIS — N179 Acute kidney failure, unspecified: Secondary | ICD-10-CM | POA: Diagnosis present

## 2019-07-21 DIAGNOSIS — Z794 Long term (current) use of insulin: Secondary | ICD-10-CM | POA: Diagnosis not present

## 2019-07-21 DIAGNOSIS — W19XXXA Unspecified fall, initial encounter: Secondary | ICD-10-CM | POA: Diagnosis not present

## 2019-07-21 DIAGNOSIS — Z66 Do not resuscitate: Secondary | ICD-10-CM | POA: Diagnosis not present

## 2019-07-21 DIAGNOSIS — Z833 Family history of diabetes mellitus: Secondary | ICD-10-CM | POA: Diagnosis not present

## 2019-07-21 DIAGNOSIS — Z993 Dependence on wheelchair: Secondary | ICD-10-CM | POA: Diagnosis not present

## 2019-07-21 LAB — CBC
HCT: 30.1 % — ABNORMAL LOW (ref 36.0–46.0)
Hemoglobin: 9.3 g/dL — ABNORMAL LOW (ref 12.0–15.0)
MCH: 28 pg (ref 26.0–34.0)
MCHC: 30.9 g/dL (ref 30.0–36.0)
MCV: 90.7 fL (ref 80.0–100.0)
Platelets: 173 10*3/uL (ref 150–400)
RBC: 3.32 MIL/uL — ABNORMAL LOW (ref 3.87–5.11)
RDW: 14.2 % (ref 11.5–15.5)
WBC: 9.8 10*3/uL (ref 4.0–10.5)
nRBC: 0 % (ref 0.0–0.2)

## 2019-07-21 LAB — GLUCOSE, CAPILLARY
Glucose-Capillary: 95 mg/dL (ref 70–99)
Glucose-Capillary: 86 mg/dL (ref 70–99)

## 2019-07-21 LAB — TSH: TSH: 1.888 u[IU]/mL (ref 0.350–4.500)

## 2019-07-21 LAB — BASIC METABOLIC PANEL
Anion gap: 8 (ref 5–15)
BUN: 61 mg/dL — ABNORMAL HIGH (ref 8–23)
CO2: 28 mmol/L (ref 22–32)
Calcium: 8.1 mg/dL — ABNORMAL LOW (ref 8.9–10.3)
Chloride: 106 mmol/L (ref 98–111)
Creatinine, Ser: 3.38 mg/dL — ABNORMAL HIGH (ref 0.44–1.00)
GFR calc Af Amer: 15 mL/min — ABNORMAL LOW (ref >60)
GFR calc non Af Amer: 13 mL/min — ABNORMAL LOW (ref >60)
Glucose, Bld: 106 mg/dL — ABNORMAL HIGH (ref 70–99)
Potassium: 4.6 mmol/L (ref 3.5–5.1)
Sodium: 142 mmol/L (ref 135–145)

## 2019-07-21 LAB — AMMONIA: Ammonia: 31 umol/L (ref 9–35)

## 2019-07-21 LAB — BLOOD GAS, VENOUS
Acid-Base Excess: 3.9 mmol/L — ABNORMAL HIGH (ref 0.0–2.0)
Bicarbonate: 26.6 mmol/L (ref 20.0–28.0)
FIO2: 21
O2 Saturation: 75.2 %
Patient temperature: 36.8
pCO2, Ven: 54.5 mmHg (ref 44.0–60.0)
pH, Ven: 7.348 (ref 7.250–7.430)
pO2, Ven: 41.9 mmHg (ref 32.0–45.0)

## 2019-07-21 MED ORDER — ENOXAPARIN SODIUM 30 MG/0.3ML ~~LOC~~ SOLN
30.0000 mg | SUBCUTANEOUS | Status: DC
  Administered 2019-07-22 – 2019-07-28 (×5): 30 mg via SUBCUTANEOUS
  Filled 2019-07-21 (×8): qty 0.3

## 2019-07-21 MED ORDER — INSULIN GLARGINE 100 UNIT/ML ~~LOC~~ SOLN
10.0000 [IU] | Freq: Every day | SUBCUTANEOUS | Status: DC
  Administered 2019-07-21 – 2019-07-28 (×6): 10 [IU] via SUBCUTANEOUS
  Filled 2019-07-21 (×10): qty 0.1

## 2019-07-21 NOTE — Progress Notes (Signed)
EEG Completed; Results Pending  

## 2019-07-21 NOTE — Progress Notes (Signed)
PROGRESS NOTE    Victoria Winters  [id number] DOB: March 11, 1951 DOA: 07/20/2019 PCP: [facility]   Brief Narrative:  Per HPI: Victoria Winters is a 68 y.o. female with medical history significant for CVA with residual deficits, diabetes mellitus, brain aneurysms, hallucinations, frequent UTIs, CKD 4-5.  History is obtained with the help of [name] who is healthcare power of attorney.  The patient is awake and alert, has short-term memory loss due to her stroke and is unable to give me history, but answers simple questions.  Patient lives with her sister. Over the past 4 days, patient sister reports increased shakiness, patient sometimes not responding to questions, not as lucid as previously or as talkative.  Patient also reported blurred pain with urination 4 days ago.   Yesterday while patient was using her walker and trying to go up the ramp into the house, patient became tremulous and fell.  Since fall patient has not been able to ambulate.  At baseline patient ambulates with walker and sometimes wheelchair. No vomiting, no loose stools, patient admitted good p.o. intake, no difficulty breathing, no cough no chest pain, no fever no chills..  Sister reports wound to patient's buttock area.  Patient sister also reports patient has an appointment (1st) to see a nephrologist but because of [employer], her  appointment date and change several times and is now in September.  Patient was admitted with what appears to be acute on chronic metabolic encephalopathy and associated fall that has resulted in a proximal fibula fracture on her left knee.  Orthopedics recommends weightbearing and checking ankle films to ensure that there is no high ankle sprain present.  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 for evaluation if above findings are within normal limits, suggesting uremic encephalopathy and likely need for dialysis initiation soon -We will discuss further with sister regarding concerns of altered mentation  Mechanical fall with subsequent proximal fibula fracture on left side -Spoke with orthopedist Dr. Tresa Moore who recommends weightbearing as tolerated and left ankle imaging to assess for high ankle fracture -PT to further assess as patient can weight-bear  CKD stage IV-V with hyperkalemia -Patient has received some IV fluid with home Lasix held -She appears to have had 700 cc of urine output -CK level mildly elevated at 273 with no overt rhabdomyolysis noted, will follow up in a.m. -Potassium is down trended to 4.6 and will repeat labs in a.m.  Type 2 diabetes -Glucose levels are quite low and will reduce Lantus dose further to 10 units daily, as oral intake is poor -Continue home Tradjenta -Continue SSI coverage  History of CVA/brain aneurysm/hallucinations -Patient does have short-term memory loss from prior stroke as well as intermittent hallucinations -Continue prior statin, aspirin, and Plavix  Hypertension-stable -Continue home Imdur, metoprolol, and clonidine -Holding hydralazine and Lasix for now   DVT prophylaxis: SCDs Code Status: Full Family Communication: Called sister on phone and will try to visit her at bedside Disposition Plan: PT evaluation after  ankle films are obtained and may need placement on discharge.  Further evaluation of encephalopathy with possible consultation to nephrology by a.m.   Consultants:   Orthopedics on phone   Procedures:   None  Antimicrobials:   None   Subjective: Patient seen and evaluated today with no new acute complaints or concerns. No acute concerns or events noted overnight.  She appears to be lethargic and overall confused.  Objective: Vitals:   07/20/19 2015 07/20/19 2106 07/21/19 0100 07/21/19 0448  BP:  (!) 106/47  (!) 107/43  Pulse: 71 72  69  Resp: 17 17  19   Temp:  99.3 F (37.4 C)  98.3 F (36.8 C)  TempSrc:  Oral  Oral  SpO2: 93% 94%  (!) 87%  Weight:  105 kg 105 kg   Height:   5\' 7"  (1.702 m)     Intake/Output Summary (Last 24 hours) at 07/21/2019 1432 Last data filed at 07/21/2019 1104 Gross per 24 hour  Intake -  Output 700 ml  Net -700 ml   Filed Weights   07/20/19 2106 07/21/19 0100  Weight: 105 kg 105 kg    Examination:  General exam: Appears calm and comfortable, obese Respiratory system: Clear to auscultation. Respiratory effort normal, nasal cannula oxygen 1 L Cardiovascular system: S1 & S2 heard, RRR. No JVD, murmurs, rubs, gallops or clicks. No pedal edema. Gastrointestinal system: Abdomen is nondistended, soft and nontender. No organomegaly or masses felt. Normal bowel sounds heard. Central nervous system: Alert, but not oriented.  Appears very confused and cannot answer questions appropriately Extremities: No significant edema noted. Skin: No rashes, lesions or ulcers, scrapes to left knee and right side as well noted Psychiatry: Cannot be appropriately assessed.    Data Reviewed: I have personally reviewed following labs and imaging studies  CBC: Recent Labs  Lab 07/20/19 1353 07/21/19 0557  WBC 13.4* 9.8  NEUTROABS 8.3*  --   HGB 10.5* 9.3*  HCT 33.7* 30.1*  MCV 88.9 90.7  PLT 191 [id number]   Basic Metabolic Panel: Recent Labs  Lab 07/20/19 1353 07/20/19 1631 07/21/19 0557  NA 140  --  142  K 6.0* 5.7* 4.6  CL 104  --  106  CO2 26  --  28  GLUCOSE 192*  --  106*  BUN 63*  --  61*  CREATININE 3.26*  --  3.38*  CALCIUM  8.2*  --  8.1*   GFR: Estimated Creatinine Clearance: 19.9 mL/min (A) (by C-G formula based on SCr of 3.38 mg/dL (H)). Liver Function Tests: Recent Labs  Lab 07/20/19 1353  AST 20  ALT 19  ALKPHOS 82  BILITOT 0.6  PROT 6.4*  ALBUMIN 3.3*   No results for input(s): LIPASE, AMYLASE in the last 168 hours. Recent Labs  Lab 07/21/19 1351  AMMONIA 31   Coagulation Profile: No results for input(s): INR, PROTIME in the last 168 hours. Cardiac Enzymes: Recent Labs  Lab 07/20/19 1353  CKTOTAL 271*   BNP (last 3 results) No results for input(s): PROBNP in the last 8760 hours. HbA1C: No results for input(s): HGBA1C in the last 72 hours. CBG: Recent Labs  Lab 07/20/19 1945 07/21/19 0755  GLUCAP 131* 95   Lipid Profile: No results for input(s): CHOL, HDL, LDLCALC, TRIG, CHOLHDL, LDLDIRECT in the last 72 hours. Thyroid Function Tests: No results for input(s): TSH, T4TOTAL, FREET4, T3FREE, THYROIDAB in the last 72 hours. Anemia Panel: No results for input(s): VITAMINB12, FOLATE, FERRITIN, TIBC, IRON, RETICCTPCT in the  last 72 hours. Sepsis Labs: No results for input(s): PROCALCITON, LATICACIDVEN in the last 168 hours.  Recent Results (from the past 240 hour(s))  SARS Coronavirus 2 [facility] order, Performed in [facility] hospital lab) Nasopharyngeal Nasopharyngeal Swab     Status: None   Collection Time: 07/20/19  5:53 PM   Specimen: Nasopharyngeal Swab  Result Value Ref Range Status   SARS Coronavirus 2 NEGATIVE NEGATIVE Final    Comment: (NOTE) If result is NEGATIVE SARS-CoV-2 target nucleic acids are NOT DETECTED. The SARS-CoV-2 RNA is generally detectable in upper and lower  respiratory specimens during the acute phase of infection. The lowest  concentration of SARS-CoV-2 viral copies this assay can detect is 250  copies / mL. A negative result does not preclude SARS-CoV-2 infection  and should not be used as the sole basis for treatment or other  patient  management decisions.  A negative result may occur with  improper specimen collection / handling, submission of specimen other  than nasopharyngeal swab, presence of viral mutation(s) within the  areas targeted by this assay, and inadequate number of viral copies  (<250 copies / mL). A negative result must be combined with clinical  observations, patient history, and epidemiological information. If result is POSITIVE SARS-CoV-2 target nucleic acids are DETECTED. The SARS-CoV-2 RNA is generally detectable in upper and lower  respiratory specimens dur ing the acute phase of infection.  Positive  results are indicative of active infection with SARS-CoV-2.  Clinical  correlation with patient history and other diagnostic information is  necessary to determine patient infection status.  Positive results do  not rule out bacterial infection or co-infection with other viruses. If result is PRESUMPTIVE POSTIVE SARS-CoV-2 nucleic acids MAY BE PRESENT.   A presumptive positive result was obtained on the submitted specimen  and confirmed on repeat testing.  While 2019 novel coronavirus  (SARS-CoV-2) nucleic acids may be present in the submitted sample  additional confirmatory testing may be necessary for epidemiological  and / or clinical management purposes  to differentiate between  SARS-CoV-2 and other Sarbecovirus currently known to infect humans.  If clinically indicated additional testing with an alternate test  methodology [phone]) is advised. The SARS-CoV-2 RNA is generally  detectable in upper and lower respiratory sp ecimens during the acute  phase of infection. The expected result is Negative. Fact Sheet for Patients:  StrictlyIdeas.no Fact Sheet for Healthcare Providers: BankingDealers.co.za This test is not yet approved or cleared by the [place] FDA and has been authorized for detection and/or diagnosis of SARS-CoV-2 by FDA under  an Emergency Use Authorization (EUA).  This EUA will remain in effect (meaning this test can be used) for the duration of the COVID-19 declaration under Section 564(b)(1) of the Act, 21 U.S.C. section 360bbb-3(b)(1), unless the authorization is terminated or revoked sooner. Performed at [facility], [address] [place], [place] [address]          Radiology Studies: Dg Pelvis 1-2 Views  Result Date: 07/20/2019 CLINICAL DATA:  68 year old female status post fall with pain. EXAM: PELVIS - 1-2 VIEW COMPARISON:  None. FINDINGS: Femoral heads are normally located. Mild asymmetric right hip joint space loss. Grossly intact proximal femurs. Upper pelvis bone detail degraded by soft tissue artifact. No pelvis fracture identified. No acute osseous abnormality identified. Visible bowel gas pattern is within normal limits. IMPRESSION: No acute fracture or dislocation identified about the pelvis. Electronically Signed   By: Genevie Ann M.D.   On: 07/20/2019 19:11  Dg Tibia/fibula Left  Result Date: 07/20/2019 CLINICAL DATA:  68 year old female with fall and trauma to the left lower extremity. EXAM: LEFT TIBIA AND FIBULA - 2 VIEW COMPARISON:  Left knee radiograph dated 07/20/2019 FINDINGS: There is a nondisplaced oblique fracture of the proximal fibular diaphysis. No other acute fracture identified. The bones are osteopenic. There is no dislocation. The ankle mortise is intact. There is diffuse subcutaneous edema. IMPRESSION: Nondisplaced oblique fracture of the proximal fibular diaphysis. Electronically Signed   By: Anner Crete M.D.   On: 07/20/2019 20:52   Dg Tibia/fibula Right  Result Date: 07/20/2019 CLINICAL DATA:  Fall with fracture in tibia/fibula. EXAM: RIGHT TIBIA AND FIBULA - 2 VIEW COMPARISON:  Knee films of earlier. FINDINGS: Degenerative changes about the lateral aspect of the knee are relatively mild. The irregularity of the fibular neck is less apparent than on dedicated knee  radiographs. IMPRESSION: No definite acute osseous abnormality. Correlate with point tenderness, given appearance of the fibular neck on dedicated knee films. Electronically Signed   By: Abigail Miyamoto M.D.   On: 07/20/2019 20:53   Ct Head Wo Contrast  Result Date: 07/20/2019 CLINICAL DATA:  Ataxia, fall, history of strokes EXAM: CT HEAD WITHOUT CONTRAST TECHNIQUE: Contiguous axial images were obtained from the base of the skull through the vertex without intravenous contrast. COMPARISON:  None. FINDINGS: Brain: Focus of gliosis in the left cerebellar hemisphere likely reflecting remote infarct no evidence of acute infarction, hemorrhage, hydrocephalus, extra-axial collection or mass lesion/mass effect. Symmetric prominence of the ventricles, cisterns and sulci compatible with parenchymal volume loss. Patchy areas of white matter hypoattenuation are most compatible with chronic microvascular angiopathy. Vascular: Atherosclerotic calcification of the carotid siphons. Skull: Hyperostosis frontalis interna. No calvarial fracture or suspicious osseous lesion. No scalp swelling or hematoma. Sinuses/Orbits: Mural thickening in the right frontal sinus and anterior right ethmoid air cells. Orbital structures are unremarkable aside from prior lens extractions. Other: None. IMPRESSION: No acute intracranial abnormality. Region of gliosis in the left cerebellar hemisphere, likely remote infarct. Volume loss and chronic white matter changes. Electronically Signed   By: Lovena Le M.D.   On: 07/20/2019 16:31   Mr Brain Wo Contrast  Result Date: 07/21/2019 CLINICAL DATA:  Altered level of consciousness. History of brain aneurysm. Memory loss. EXAM: MRI HEAD WITHOUT CONTRAST TECHNIQUE: Multiplanar, multiecho pulse sequences of the brain and surrounding structures were obtained without intravenous contrast. COMPARISON:  Head CT yesterday. FINDINGS: Brain: Diffusion imaging does not show any acute or subacute infarction.  There are old ischemic changes of the pons. No cerebellar abnormality. Cerebral hemispheres show old infarction in the left basal ganglia and internal capsule and in the right basal ganglia external capsule. Chronic small-vessel changes affect the thalami in the hemispheric white matter. No large vessel territory infarction. Hemosiderin deposition associated with the old basal ganglia infarctions. No sign of recent hemorrhage, hydrocephalus or extra-axial collection. Vascular: Major vessels at the base of the brain show flow. Skull and upper cervical spine: Negative Sinuses/Orbits: Inflammatory changes of the right frontal and ethmoid sinuses. Other sinuses clear. Orbits negative. Other: None IMPRESSION: No acute or reversible finding. Old ischemic changes affecting the pons. Old infarctions in the basal ganglia and radiating white matter tracts, with hemosiderin deposition. Electronically Signed   By: Nelson Chimes M.D.   On: 07/21/2019 12:31   Dg Chest Port 1 View  Result Date: 07/20/2019 CLINICAL DATA:  Diabetes, smoker EXAM: PORTABLE CHEST 1 VIEW COMPARISON:  None. FINDINGS: Cardiomegaly with vascular congestion.  No focal consolidation or effusion. No pneumothorax. Patchy sclerosis proximal left humerus, possible infarct or chondral lesion. IMPRESSION: Cardiomegaly with vascular congestion Electronically Signed   By: Donavan Foil M.D.   On: 07/20/2019 19:54   Dg Knee Complete 4 Views Left  Result Date: 07/20/2019 CLINICAL DATA:  Fall with knee pain EXAM: LEFT KNEE - COMPLETE 4+ VIEW COMPARISON:  None. FINDINGS: Mild degenerative change of the medial joint space. Mild patellofemoral degenerative change. Small knee effusion. Acute minimally displaced fracture involving the proximal shaft of the fibula. IMPRESSION: 1. Acute minimally displaced fracture proximal shaft of fibula 2. Mild degenerative changes of the knee.  Small knee effusion Electronically Signed   By: Donavan Foil M.D.   On: 07/20/2019 15:23    Dg Knee Complete 4 Views Right  Result Date: 07/20/2019 CLINICAL DATA:  Fall EXAM: RIGHT KNEE - COMPLETE 4+ VIEW COMPARISON:  None. FINDINGS: Possible acute, subtle slightly impacted fracture at the fibular neck. No dislocation. Mild patellofemoral and lateral joint space degenerative change. Trace knee effusion IMPRESSION: 1. Findings questionable for slightly impacted fracture at the fibular neck 2. Mild degenerative changes with trace knee effusion Electronically Signed   By: Donavan Foil M.D.   On: 07/20/2019 15:22        Scheduled Meds: . aspirin EC  81 mg Oral q morning - 10a  . atorvastatin  40 mg Oral QHS  . [START ON 07/23/2019] cloNIDine  0.2 mg Transdermal Q Sun  . clopidogrel  75 mg Oral q morning - 10a  . doxycycline  100 mg Oral q morning - 10a  . [START ON 07/22/2019] enoxaparin (LOVENOX) injection  30 mg Subcutaneous Q24H  . insulin aspart  0-15 Units Subcutaneous TID WC  . insulin glargine  10 Units Subcutaneous QHS  . isosorbide mononitrate  30 mg Oral q morning - 10a  . linagliptin  5 mg Oral q morning - 10a  . loratadine  10 mg Oral q morning - 10a  . metoprolol  200 mg Oral q morning - 10a  . pantoprazole  40 mg Oral Daily   Continuous Infusions:   LOS: 0 days    Time spent: 30 minutes    Marialy Urbanczyk Darleen Crocker, DO [facility] Pager [phone]  If 7PM-7AM, please contact night-coverage [email or web address] Password [facility] 07/21/2019, 2:32 PM

## 2019-07-21 NOTE — Procedures (Addendum)
Patient Name: Victoria Winters  MRN: [id number]  Epilepsy Attending: Lora Havens  Referring Physician/Provider: Dr Heath Lark Date: 07/21/2019 Duration: 24.30 mins  Patient history: 68 yo f with altered mental status. EEG to evaluate for seizures  Level of alertness: letahrgic  Technical aspects: This EEG study was done with scalp electrodes positioned according to the 10-20 International system of electrode placement. Electrical activity was acquired at a sampling rate of 500Hz  and reviewed with a high frequency filter of 70Hz  and a low frequency filter of 1Hz . EEG data were recorded continuously and digitally stored.   DESCRIPTION: EEG showed continuous generalized 3-6hz  theta delta slowing as well as 1-1.5Hz  triphasic waves, max bifrontal. Hyperventilation and photic stimulation were not performed.  IMPRESSION: This study is suggestive of moderate diffuse encephalopathy likely secondary to metabolic etiology. No seizures or clear epileptiform discharges were seen throughout the recording.      Evelin Cake Barbra Sarks

## 2019-07-21 NOTE — Consult Note (Signed)
Have reviewed the x-rays which demonstrate left spiral proximal fibula fracture without ankle subluxation or other abnormality.   If ankle films do not show syndesmotic widening then patient may WBAT without bracing, rather just an ACE wrap, ice, and intermittent elevation. Assistive walking devices may be helpful.  Happy to see in follow up in office in 2 weeks for new films and assessment.  Altames[employer], MD [facility], [facility] [phone]

## 2019-07-21 NOTE — Progress Notes (Signed)
Pharmacy Renal Dosing Adjustment  Drug:  Lovenox  Original dose:  Lovenox 50mg  sub-q daily SCr:  3.38 mg/dL CrClest ~20 mL/min New dose:  Lovenox 30mg  sub-q  daily  Thank You, Despina Pole, Pharm. D. Clinical Pharmacist 07/21/2019 10:52 AM

## 2019-07-21 NOTE — Progress Notes (Signed)
Held AM meds because patient was sleeping and lethargic.

## 2019-07-21 NOTE — NC FL2 (Signed)
[place] LEVEL OF CARE SCREENING TOOL     IDENTIFICATION  Patient Name: Victoria Winters Birthdate: Dec 07, 1951 Sex: female Admission Date (Current Location): 07/20/2019  [facility] and [place] Number:  [employer] and Address:  [place] [address], [place]      Provider Number: [id number]  Attending Physician Name and Address:  Rodena Goldmann, DO  Relative Name and Phone Number:       Current Level of Care: Hospital Recommended Level of Care: [place] Prior Approval Number:    Date Approved/Denied:   PASRR Number: [id number]  Discharge Plan: SNF    Current Diagnoses: Patient Active Problem List   Diagnosis Date Noted  . Acute encephalopathy 07/21/2019  . Fall 07/20/2019  . 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 Brain aneurysm 07/20/2019  . DM (diabetes mellitus) ([place]) 07/20/2019  . Bilateral fibular fractures 07/20/2019  . CVA (cerebral vascular accident) ([place]) 07/20/2019  . Hallucination 07/20/2019    Orientation RESPIRATION BLADDER Height & Weight     Self, Place  Normal Continent Weight: 231 lb 7.7 oz (105 kg) Height:  5\' 7"  (170.2 cm)  BEHAVIORAL SYMPTOMS/MOOD NEUROLOGICAL BOWEL NUTRITION STATUS      Continent Diet(see dc summary)  AMBULATORY STATUS COMMUNICATION OF NEEDS Skin   Extensive Assist Verbally PU Stage and Appropriate Care(buttock wound)                       Personal Care Assistance Level of Assistance  Bathing, Feeding, Dressing Bathing Assistance: Limited assistance Feeding assistance: Limited assistance Dressing Assistance: Limited assistance     Functional Limitations Info  Sight, Hearing, Speech Sight Info: Adequate Hearing Info: Adequate Speech Info: Adequate    SPECIAL CARE FACTORS FREQUENCY  PT (By licensed PT), OT (By licensed OT)     PT Frequency: 5 times week OT Frequency: 3 times week            Contractures Contractures Info: Not  present    Additional Factors Info  Code Status, Allergies, Psychotropic, Insulin Sliding Scale Code Status Info: full Allergies Info: Penicillins, Chicken, eggs, milk-related compounds, wheat bran Psychotropic Info: Lexapro Insulin Sliding Scale Info: see dc summary       Current Medications (07/21/2019):  This is the current hospital active medication list Current Facility-Administered Medications  Medication Dose Route Frequency Provider Last Rate Last Dose  . acetaminophen (TYLENOL) tablet 650 mg  650 mg Oral Q6H PRN Emokpae, Ejiroghene E, MD       Or  . acetaminophen (TYLENOL) suppository 650 mg  650 mg Rectal Q6H PRN Emokpae, Ejiroghene E, MD      . aspirin EC tablet 81 mg  81 mg Oral q morning - 10a Emokpae, Ejiroghene E, MD      . atorvastatin (LIPITOR) tablet 40 mg  40 mg Oral QHS Emokpae, Ejiroghene E, MD   40 mg at 07/21/19 0005  . [START ON 07/23/2019] cloNIDine (CATAPRES - Dosed in mg/24 hr) patch 0.2 mg  0.2 mg Transdermal Q Sun Emokpae, Ejiroghene E, MD      . clopidogrel (PLAVIX) tablet 75 mg  75 mg Oral q morning - 10a Emokpae, Ejiroghene E, MD      . doxycycline (VIBRA-TABS) tablet 100 mg  100 mg Oral q morning - 10a Emokpae, Ejiroghene E, MD      . Derrill Memo ON 07/22/2019] enoxaparin (LOVENOX) injection 30 mg  30 mg Subcutaneous Q24H Manuella Ghazi, Pratik D,  DO      . insulin aspart (novoLOG) injection 0-15 Units  0-15 Units Subcutaneous TID WC Emokpae, Ejiroghene E, MD      . insulin glargine (LANTUS) injection 10 Units  10 Units Subcutaneous QHS Shah, Pratik D, DO      . isosorbide mononitrate (IMDUR) 24 hr tablet 30 mg  30 mg Oral q morning - 10a Emokpae, Ejiroghene E, MD      . linagliptin (TRADJENTA) tablet 5 mg  5 mg Oral q morning - 10a Emokpae, Ejiroghene E, MD      . loratadine (CLARITIN) tablet 10 mg  10 mg Oral q morning - 10a Emokpae, Ejiroghene E, MD      . metoprolol succinate (TOPROL-XL) 24 hr tablet 200 mg  200 mg Oral q morning - 10a Emokpae, Ejiroghene E, MD      .  morphine 2 MG/ML injection 2 mg  2 mg Intravenous Q6H PRN Emokpae, Ejiroghene E, MD   2 mg at 07/21/19 0003  . ondansetron (ZOFRAN) tablet 4 mg  4 mg Oral Q6H PRN Emokpae, Ejiroghene E, MD       Or  . ondansetron (ZOFRAN) injection 4 mg  4 mg Intravenous Q6H PRN Emokpae, Ejiroghene E, MD      . pantoprazole (PROTONIX) EC tablet 40 mg  40 mg Oral Daily Emokpae, Ejiroghene E, MD      . polyethylene glycol (MIRALAX / GLYCOLAX) packet 17 g  17 g Oral Daily PRN Emokpae, Ejiroghene E, MD         Discharge Medications: Please see discharge summary for a list of discharge medications.  Relevant Imaging Results:  Relevant Lab Results:   Additional Information SSN: [place], [place]

## 2019-07-21 NOTE — Plan of Care (Signed)
  Problem: Education: Goal: Knowledge of General Education information will improve Description: Including pain rating scale, medication(s)/side effects and non-pharmacologic comfort measures Outcome: Progressing   Problem: Clinical Measurements: Goal: Ability to maintain clinical measurements within normal limits will improve Outcome: Progressing   

## 2019-07-21 NOTE — TOC Initial Note (Signed)
Transition of Care [facility]) - Initial/Assessment Note    Patient Details  Name: Victoria Winters MRN: [id number] Date of Birth: [birth year]  [facility]) CM/SW Contact:    Shade Flood, LCSW Phone Number: 07/21/2019, 3:10 PM  Clinical Narrative:                  Pt admitted from home. She lives with her sister who is her POA and caregiver. 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 Spoke with pt's sister, Victoria Winters, today to assess. Pt has walker and wheelchair at home. PT has not assessed pt yet but MD is anticipating pt will need SNF rehab at dc. Discussed with Victoria Winters who is in agreement. She states that she plans to take pt home once she has completed rehab. Will refer to [facility] request.   Updated MD. Will follow up Monday.  Expected Discharge Plan: Skilled Nursing Facility Barriers to Discharge: Continued Medical Work up   Patient Goals and CMS Choice   CMS Medicare.gov Compare Post Acute Care list provided to:: Patient Represenative (must comment) Choice offered to / list presented to : Sibling  Expected Discharge Plan and Services Expected Discharge Plan: [place] In-house Referral: Clinical Social Work   Post Acute Care Choice: [place] Living arrangements for the past 2 months: [place] Expected Discharge Date: 07/23/19                                    Prior Living Arrangements/Services Living arrangements for the past 2 months: Single Family Home Lives with:: Siblings Patient language and need for interpreter reviewed:: Yes Do you feel safe going back to the place where you live?: Yes      Need for Family Participation in Patient Care: Yes (Comment) Care giver support system in place?: Yes (comment) Current home services: DME Criminal Activity/Legal Involvement Pertinent to Current Situation/Hospitalization: No - Comment as needed  Activities of Daily Living Home Assistive  Devices/Equipment: Wheelchair, Environmental consultant (specify type), Other (Comment) ADL Screening (condition at time of admission) Patient's cognitive ability adequate to safely complete daily activities?: No Is the patient deaf or have difficulty hearing?: No Does the patient have difficulty seeing, even when wearing glasses/contacts?: No Does the patient have difficulty concentrating, remembering, or making decisions?: Yes Patient able to express need for assistance with ADLs?: Yes Does the patient have difficulty dressing or bathing?: Yes Independently performs ADLs?: No Communication: Needs assistance Is this a change from baseline?: Pre-admission baseline Dressing (OT): Needs assistance Is this a change from baseline?: Pre-admission baseline Grooming: Needs assistance Is this a change from baseline?: Pre-admission baseline Feeding: Needs assistance Is this a change from baseline?: Pre-admission baseline Bathing: Needs assistance Is this a change from baseline?: Pre-admission baseline Toileting: Needs assistance Is this a change from baseline?: Pre-admission baseline In/Out Bed: Dependent Walks in Home: Needs assistance Is this a change from baseline?: Pre-admission baseline Does the patient have difficulty walking or climbing stairs?: Yes Weakness of Legs: Both Weakness of Arms/Hands: Both  Permission Sought/Granted Permission sought to share information with : Facility Art therapist granted to share information with : Yes, Verbal Permission Granted     Permission granted to share info w AGENCY: [facility]        Emotional Assessment Appearance:: Appears stated age Attitude/Demeanor/Rapport: Unable to Assess Affect (typically observed): Unable to Assess Orientation: : Oriented to Self, Oriented to Place  Alcohol / Substance Use: Not Applicable Psych Involvement: No (comment)  Admission diagnosis:  Hyperkalemia [E87.5] Fall [W19.XXXA] Hypoxia [R09.02] AKI (acute  kidney injury) ([place]) [N17.9] Altered mental status, unspecified altered mental status type [R41.82] Other closed fracture of shaft of right fibula, initial encounter [S82.491A] Other closed fracture of shaft of left fibula, initial encounter [S82.492A] Patient Active Problem List   Diagnosis Date Noted  . Acute encephalopathy 07/21/2019  . Fall 07/20/2019  . 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 Brain aneurysm 07/20/2019  . DM (diabetes mellitus) ([place]) 07/20/2019  . Bilateral fibular fractures 07/20/2019  . CVA (cerebral vascular accident) ([place]) 07/20/2019  . Hallucination 07/20/2019   PCP:  [facility]:   [place], [place] [address] [place] [address] Phone: [phone] Fax: [phone]     Social Determinants of Health (SDOH) Interventions    Readmission Risk Interventions Readmission Risk Prevention Plan 07/21/2019  Transportation Screening Complete  Medication Review (RN CM) Complete

## 2019-07-22 LAB — BASIC METABOLIC PANEL
Anion gap: 7 (ref 5–15)
BUN: 51 mg/dL — ABNORMAL HIGH (ref 8–23)
CO2: 28 mmol/L (ref 22–32)
Calcium: 8.2 mg/dL — ABNORMAL LOW (ref 8.9–10.3)
Chloride: 109 mmol/L (ref 98–111)
Creatinine, Ser: 2.91 mg/dL — ABNORMAL HIGH (ref 0.44–1.00)
GFR calc Af Amer: 18 mL/min — ABNORMAL LOW (ref >60)
GFR calc non Af Amer: 16 mL/min — ABNORMAL LOW (ref >60)
Glucose, Bld: 163 mg/dL — ABNORMAL HIGH (ref 70–99)
Potassium: 4.5 mmol/L (ref 3.5–5.1)
Sodium: 144 mmol/L (ref 135–145)

## 2019-07-22 LAB — CBC
HCT: 31.9 % — ABNORMAL LOW (ref 36.0–46.0)
Hemoglobin: 9.8 g/dL — ABNORMAL LOW (ref 12.0–15.0)
MCH: 27 pg (ref 26.0–34.0)
MCHC: 30.7 g/dL (ref 30.0–36.0)
MCV: 87.9 fL (ref 80.0–100.0)
Platelets: 167 10*3/uL (ref 150–400)
RBC: 3.63 MIL/uL — ABNORMAL LOW (ref 3.87–5.11)
RDW: 13.6 % (ref 11.5–15.5)
WBC: 9.8 10*3/uL (ref 4.0–10.5)
nRBC: 0 % (ref 0.0–0.2)

## 2019-07-22 LAB — GLUCOSE, CAPILLARY
Glucose-Capillary: 128 mg/dL — ABNORMAL HIGH (ref 70–99)
Glucose-Capillary: 138 mg/dL — ABNORMAL HIGH (ref 70–99)
Glucose-Capillary: 202 mg/dL — ABNORMAL HIGH (ref 70–99)
Glucose-Capillary: 109 mg/dL — ABNORMAL HIGH (ref 70–99)

## 2019-07-22 LAB — HIV ANTIBODY (ROUTINE TESTING W REFLEX): HIV Screen 4th Generation wRfx: NONREACTIVE

## 2019-07-22 NOTE — Progress Notes (Signed)
PROGRESS NOTE    Victoria Winters Dec  [id number] DOB: [birth year] DOA: 07/20/2019 PCP: [facility]   Brief Narrative:  Per HPI: Victoria Winters a 68 y.o.femalewith medical history significant forCVA with residual deficits, diabetes mellitus, brain aneurysms, hallucinations, frequent UTIs, CKD 4-5. History is obtained with the help of [name] who is healthcare power of attorney.The patient is awake and alert, has short-term memory loss due to her stroke and is unable to give me history,but answers simple questions.Patient lives with her sister. Over the past 4 days,patient sister reports increased shakiness, patient sometimes not respondingto questions, not as lucid as previously or as talkative.Patient also reported blurred pain with urination 4 days ago.  Yesterday while patient was using her walker and trying to go up the ramp into the house, patient became tremulous and fell. Since fall patient has not been able to ambulate. At baseline patient ambulates with walker and sometimes wheelchair. No vomiting, no loose stools, patient admitted good p.o. intake,no difficulty breathing, no cough no chest pain,no fever no chills.. Sister reports wound to patient's buttock area.  Patient sister also reports patient has an appointment(1st)to see a nephrologist but because ofCoVID, herappointment dateand change several times and is now in September.  Patient was admitted with what appears to be acute on chronic metabolic encephalopathy and associated fall that has resulted in a proximal fibula fracture on her left knee.  Orthopedics recommends weightbearing and checking ankle films to ensure that there is no high ankle sprain present.  8/15: Checked ankle film with nondisplaced dorsal talus fracture noted on lateral view on the left side.  Discussed with Dr. Stann Mainland with orthopedics on-call who states that this is simply a result of her ankle sprain and  should be treated as such and she should be allowed to weight-bear.  Assessed by PT with recommendations for SNF.  EEG with no signs of seizure activity noted.  Appreciate nephrology evaluation for possible uremic encephalopathy although, this is likely not the case.  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   Brain aneurysm   DM (diabetes mellitus) ([facility])   Bilateral fibular fractures   CVA (cerebral vascular accident) ([place])   Hallucination   Acute encephalopathy   Acute on chronic metabolic encephalopathy- unknown etiology; improved -CT head without any acute abnormalities and brain MRI with no acute abnormalities noted -UA not suggestive of infection -TSH noted to be 1.88 and ammonia 31 -Appreciate nephrology evaluation with plans for 24-hour creatinine clearance to be obtained -EEG with no seizure activity noted  Mechanical fall with subsequent proximal fibula fracture on left side -Spoke with orthopedist Dr. Tresa Moore who recommends weightbearing as tolerated and left ankle imaging to assess for high ankle fracture on 8/14 -Discussed again on 8/15 with Dr. Stann Mainland regarding nondisplaced dorsal talus fracture on ankle film.  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 -Potassium is down trended to 4.5 and will repeat labs in a.m. -Appreciate nephrology evaluation with 24-hour creatinine clearance  Type 2 diabetes -Glucose levels are improved and will maintain on Lantus 10  units daily for now and increase as needed. -Continue home Tradjenta -Continue  SSI coverage  History of CVA/brain aneurysm/hallucinations -Patient does have short-term memory loss from prior stroke as well as intermittent hallucinations -Continue prior statin, aspirin, and Plavix  Hypertension-stable -Continue home Imdur, metoprolol, and clonidine -Holding hydralazine and Lasix for now   DVT prophylaxis: SCDs Code Status: Full Family Communication:  Discussed with sister at bedside on 8/15 Disposition Plan:  Appreciate PT evaluation with recommendations for SNF on discharge.  Appreciate nephrology evaluation with 24-hour creatinine clearance plan today.   Consultants:   Orthopedics on phone  Nephrology  Procedures:   As above with EEG on 8/14 with no seizure activity  Antimicrobials:   None  Subjective: Patient seen and evaluated today with no new acute complaints or concerns. No acute concerns or events noted overnight.  She appears to be more awake and alert today.  Objective: Vitals:   07/21/19 0448 07/21/19 1400 07/21/19 2144 07/22/19 0632  BP: (!) 107/43 (!) 144/54 (!) 148/56 (!) 147/56  Pulse: 69 62 64 66  Resp: 19 14 18 18   Temp: 98.3 F (36.8 C) 98.4 F (36.9 C) 98 F (36.7 C) 99.8 F (37.7 C)  TempSrc: Oral Oral Oral Oral  SpO2: (!) 87% 98% 96% 92%  Weight:      Height:        Intake/Output Summary (Last 24 hours) at 07/22/2019 1415 Last data filed at 07/22/2019 1300 Gross per 24 hour  Intake 360 ml  Output 1000 ml  Net -640 ml   Filed Weights   07/20/19 2106 07/21/19 0100  Weight: 105 kg 105 kg    Examination:  General exam: Appears calm and comfortable, appears more lucid today, obese Respiratory system: Clear to auscultation. Respiratory effort normal. Cardiovascular system: S1 & S2 heard, RRR. No JVD, murmurs, rubs, gallops or clicks. No pedal edema. Gastrointestinal system: Abdomen is nondistended, soft and nontender. No organomegaly or masses felt. Normal bowel sounds heard. Central nervous system: Alert  and awake Extremities: No significant edema Skin: No rashes, lesions or ulcers Psychiatry: Difficult to assess.    Data Reviewed: I have personally reviewed following labs and imaging studies  CBC: Recent Labs  Lab 07/20/19 1353 07/21/19 0557 07/22/19 1038  WBC 13.4* 9.8 9.8  NEUTROABS 8.3*  --   --   HGB 10.5* 9.3* 9.8*  HCT 33.7* 30.1* 31.9*  MCV 88.9 90.7 87.9  PLT 191 173 [id number]   Basic Metabolic Panel: Recent Labs  Lab 07/20/19 1353 07/20/19 1631 07/21/19 0557 07/22/19 1038  NA 140  --  142 144  K 6.0* 5.7* 4.6 4.5  CL 104  --  106 109  CO2 26  --  28 28  GLUCOSE 192*  --  106* 163*  BUN 63*  --  61* 51*  CREATININE 3.26*  --  3.38* 2.91*  CALCIUM 8.2*  --  8.1* 8.2*   GFR: Estimated Creatinine Clearance: 23.1 mL/min (A) (by C-G formula based on SCr of 2.91 mg/dL (H)). Liver Function Tests: Recent Labs  Lab 07/20/19 1353  AST 20  ALT 19  ALKPHOS 82  BILITOT 0.6  PROT 6.4*  ALBUMIN 3.3*   No results for input(s): LIPASE, AMYLASE in the last 168 hours. Recent Labs  Lab 07/21/19 1351  AMMONIA 31   Coagulation Profile: No results for input(s): INR, PROTIME in the last 168 hours. Cardiac Enzymes: Recent Labs  Lab 07/20/19 1353  CKTOTAL 271*   BNP (last 3 results) No  results for input(s): PROBNP in the last 8760 hours. HbA1C: No results for input(s): HGBA1C in the last 72 hours. CBG: Recent Labs  Lab 07/20/19 1945 07/21/19 0755 07/21/19 1612 07/22/19 0725 07/22/19 1116  GLUCAP 131* 95 86 128* 138*   Lipid Profile: No results for input(s): CHOL, HDL, LDLCALC, TRIG, CHOLHDL, LDLDIRECT in the last 72 hours. Thyroid Function Tests: Recent Labs    07/21/19 1351  TSH 1.888   Anemia Panel: No results for input(s): VITAMINB12, FOLATE, FERRITIN, TIBC, IRON, RETICCTPCT in the last 72 hours. Sepsis Labs: No results for input(s): PROCALCITON, LATICACIDVEN in the last 168 hours.  Recent Results (from the past 240 hour(s))  SARS Coronavirus 2  [facility] order, Performed in [facility] hospital lab) Nasopharyngeal Nasopharyngeal Swab     Status: None   Collection Time: 07/20/19  5:53 PM   Specimen: Nasopharyngeal Swab  Result Value Ref Range Status   SARS Coronavirus 2 NEGATIVE NEGATIVE Final    Comment: (NOTE) If result is NEGATIVE SARS-CoV-2 target nucleic acids are NOT DETECTED. The SARS-CoV-2 RNA is generally detectable in upper and lower  respiratory specimens during the acute phase of infection. The lowest  concentration of SARS-CoV-2 viral copies this assay can detect is 250  copies / mL. A negative result does not preclude SARS-CoV-2 infection  and should not be used as the sole basis for treatment or other  patient management decisions.  A negative result may occur with  improper specimen collection / handling, submission of specimen other  than nasopharyngeal swab, presence of viral mutation(s) within the  areas targeted by this assay, and inadequate number of viral copies  (<250 copies / mL). A negative result must be combined with clinical  observations, patient history, and epidemiological information. If result is POSITIVE SARS-CoV-2 target nucleic acids are DETECTED. The SARS-CoV-2 RNA is generally detectable in upper and lower  respiratory specimens dur ing the acute phase of infection.  Positive  results are indicative of active infection with SARS-CoV-2.  Clinical  correlation with patient history and other diagnostic information is  necessary to determine patient infection status.  Positive results do  not rule out bacterial infection or co-infection with other viruses. If result is PRESUMPTIVE POSTIVE SARS-CoV-2 nucleic acids MAY BE PRESENT.   A presumptive positive result was obtained on the submitted specimen  and confirmed on repeat testing.  While 2019 novel coronavirus  (SARS-CoV-2) nucleic acids may be present in the submitted sample  additional confirmatory testing may be necessary for  epidemiological  and / or clinical management purposes  to differentiate between  SARS-CoV-2 and other Sarbecovirus currently known to infect humans.  If clinically indicated additional testing with an alternate test  methodology [phone]) is advised. The SARS-CoV-2 RNA is generally  detectable in upper and lower respiratory sp ecimens during the acute  phase of infection. The expected result is Negative. Fact Sheet for Patients:  StrictlyIdeas.no Fact Sheet for Healthcare Providers: BankingDealers.co.za This test is not yet approved or cleared by the [place] FDA and has been authorized for detection and/or diagnosis of SARS-CoV-2 by FDA under an Emergency Use Authorization (EUA).  This EUA will remain in effect (meaning this test can be used) for the duration of the COVID-19 declaration under Section 564(b)(1) of the Act, 21 U.S.C. section 360bbb-3(b)(1), unless the authorization is terminated or revoked sooner. Performed at [facility], [address] [place], [place] [address]          Radiology Studies: Dg Pelvis 1-2 Views  Result Date: 07/20/2019 CLINICAL DATA:  68 year old female status post fall with pain. EXAM: PELVIS - 1-2 VIEW COMPARISON:  None. FINDINGS: Femoral heads are normally located. Mild asymmetric right hip joint space loss. Grossly intact proximal femurs. Upper pelvis bone detail degraded by soft tissue artifact. No pelvis fracture identified. No acute osseous abnormality identified. Visible bowel gas pattern is within normal limits. IMPRESSION: No acute fracture or dislocation identified about the pelvis. Electronically Signed   By: Genevie Ann M.D.   On: 07/20/2019 19:11   Dg Tibia/fibula Left  Result Date: 07/20/2019 CLINICAL DATA:  68 year old female with fall and trauma to the left lower extremity. EXAM: LEFT TIBIA AND FIBULA - 2 VIEW COMPARISON:  Left knee radiograph dated 07/20/2019 FINDINGS: There is a  nondisplaced oblique fracture of the proximal fibular diaphysis. No other acute fracture identified. The bones are osteopenic. There is no dislocation. The ankle mortise is intact. There is diffuse subcutaneous edema. IMPRESSION: Nondisplaced oblique fracture of the proximal fibular diaphysis. Electronically Signed   By: Anner Crete M.D.   On: 07/20/2019 20:52   Dg Tibia/fibula Right  Result Date: 07/20/2019 CLINICAL DATA:  Fall with fracture in tibia/fibula. EXAM: RIGHT TIBIA AND FIBULA - 2 VIEW COMPARISON:  Knee films of earlier. FINDINGS: Degenerative changes about the lateral aspect of the knee are relatively mild. The irregularity of the fibular neck is less apparent than on dedicated knee radiographs. IMPRESSION: No definite acute osseous abnormality. Correlate with point tenderness, given appearance of the fibular neck on dedicated knee films. Electronically Signed   By: Abigail Miyamoto M.D.   On: 07/20/2019 20:53   Dg Ankle Complete Left  Result Date: 07/21/2019 CLINICAL DATA:  Recent fall, pain and swelling EXAM: LEFT ANKLE COMPLETE - 3+ VIEW COMPARISON:  07/21/2019 FINDINGS: Bones are osteopenic. Mild diffuse soft tissue swelling. Distal tibia and fibula appear intact. On the lateral view, there is cortical irregularity and fragmentation of the dorsal talus cortical surface suspicious for fracture. No ankle malalignment. IMPRESSION: Findings suspicious for nondisplaced dorsal talus fracture on the lateral view. Osteopenia Diffuse soft tissue swelling No malalignment Electronically Signed   By: Jerilynn Mages.  Shick M.D.   On: 07/21/2019 14:42   Ct Head Wo Contrast  Result Date: 07/20/2019 CLINICAL DATA:  Ataxia, fall, history of strokes EXAM: CT HEAD WITHOUT CONTRAST TECHNIQUE: Contiguous axial images were obtained from the base of the skull through the vertex without intravenous contrast. COMPARISON:  None. FINDINGS: Brain: Focus of gliosis in the left cerebellar hemisphere likely reflecting remote  infarct no evidence of acute infarction, hemorrhage, hydrocephalus, extra-axial collection or mass lesion/mass effect. Symmetric prominence of the ventricles, cisterns and sulci compatible with parenchymal volume loss. Patchy areas of white matter hypoattenuation are most compatible with chronic microvascular angiopathy. Vascular: Atherosclerotic calcification of the carotid siphons. Skull: Hyperostosis frontalis interna. No calvarial fracture or suspicious osseous lesion. No scalp swelling or hematoma. Sinuses/Orbits: Mural thickening in the right frontal sinus and anterior right ethmoid air cells. Orbital structures are unremarkable aside from prior lens extractions. Other: None. IMPRESSION: No acute intracranial abnormality. Region of gliosis in the left cerebellar hemisphere, likely remote infarct. Volume loss and chronic white matter changes. Electronically Signed   By: Lovena Le M.D.   On: 07/20/2019 16:31   Mr Brain Wo Contrast  Result Date: 07/21/2019 CLINICAL DATA:  Altered level of consciousness. History of brain aneurysm. Memory loss. EXAM: MRI HEAD WITHOUT CONTRAST TECHNIQUE: Multiplanar, multiecho pulse sequences of the brain and surrounding structures were obtained without  intravenous contrast. COMPARISON:  Head CT yesterday. FINDINGS: Brain: Diffusion imaging does not show any acute or subacute infarction. There are old ischemic changes of the pons. No cerebellar abnormality. Cerebral hemispheres show old infarction in the left basal ganglia and internal capsule and in the right basal ganglia external capsule. Chronic small-vessel changes affect the thalami in the hemispheric white matter. No large vessel territory infarction. Hemosiderin deposition associated with the old basal ganglia infarctions. No sign of recent hemorrhage, hydrocephalus or extra-axial collection. Vascular: Major vessels at the base of the brain show flow. Skull and upper cervical spine: Negative Sinuses/Orbits:  Inflammatory changes of the right frontal and ethmoid sinuses. Other sinuses clear. Orbits negative. Other: None IMPRESSION: No acute or reversible finding. Old ischemic changes affecting the pons. Old infarctions in the basal ganglia and radiating white matter tracts, with hemosiderin deposition. Electronically Signed   By: Nelson Chimes M.D.   On: 07/21/2019 12:31   Dg Chest Port 1 View  Result Date: 07/20/2019 CLINICAL DATA:  Diabetes, smoker EXAM: PORTABLE CHEST 1 VIEW COMPARISON:  None. FINDINGS: Cardiomegaly with vascular congestion. No focal consolidation or effusion. No pneumothorax. Patchy sclerosis proximal left humerus, possible infarct or chondral lesion. IMPRESSION: Cardiomegaly with vascular congestion Electronically Signed   By: Donavan Foil M.D.   On: 07/20/2019 19:54   Dg Knee Complete 4 Views Left  Result Date: 07/20/2019 CLINICAL DATA:  Fall with knee pain EXAM: LEFT KNEE - COMPLETE 4+ VIEW COMPARISON:  None. FINDINGS: Mild degenerative change of the medial joint space. Mild patellofemoral degenerative change. Small knee effusion. Acute minimally displaced fracture involving the proximal shaft of the fibula. IMPRESSION: 1. Acute minimally displaced fracture proximal shaft of fibula 2. Mild degenerative changes of the knee.  Small knee effusion Electronically Signed   By: Donavan Foil M.D.   On: 07/20/2019 15:23   Dg Knee Complete 4 Views Right  Result Date: 07/20/2019 CLINICAL DATA:  Fall EXAM: RIGHT KNEE - COMPLETE 4+ VIEW COMPARISON:  None. FINDINGS: Possible acute, subtle slightly impacted fracture at the fibular neck. No dislocation. Mild patellofemoral and lateral joint space degenerative change. Trace knee effusion IMPRESSION: 1. Findings questionable for slightly impacted fracture at the fibular neck 2. Mild degenerative changes with trace knee effusion Electronically Signed   By: Donavan Foil M.D.   On: 07/20/2019 15:22        Scheduled Meds: . aspirin EC  81 mg Oral  q morning - 10a  . atorvastatin  40 mg Oral QHS  . [START ON 07/23/2019] cloNIDine  0.2 mg Transdermal Q Sun  . clopidogrel  75 mg Oral q morning - 10a  . doxycycline  100 mg Oral q morning - 10a  . enoxaparin (LOVENOX) injection  30 mg Subcutaneous Q24H  . insulin aspart  0-15 Units Subcutaneous TID WC  . insulin glargine  10 Units Subcutaneous QHS  . isosorbide mononitrate  30 mg Oral q morning - 10a  . linagliptin  5 mg Oral q morning - 10a  . loratadine  10 mg Oral q morning - 10a  . metoprolol  200 mg Oral q morning - 10a  . pantoprazole  40 mg Oral Daily   Continuous Infusions:   LOS: 1 day    Time spent: 30 minutes    Loyola Santino Darleen Crocker, DO [facility] Pager [phone]  If 7PM-7AM, please contact night-coverage [email or web address] Password [email or web address] 07/22/2019, 2:15 PM

## 2019-07-22 NOTE — Evaluation (Addendum)
Physical Therapy Evaluation Patient Details Name: Victoria Winters MRN: [id number] DOB: 1951/09/07 Today's Date: 07/22/2019   History of Present Illness  Patient is a 68 year old female admitted after a fall at home. Was found to have a nondisplaced left ankle fracture of the dorsal talus, proximal left fibular fracture and moderate diffuse encephalopathy with altered mental status.  PMH: CKD 4-5, brain aneurysm, DM, CVA, hallucinations, short term memory loss.    Clinical Impression  Pt admitted with above diagnosis. Patient had a very difficult time with expressive language initially but by the end of evaluation was able to string 4-5 sentences together to express her needs. Patient was given IV morphine by nursing prior to mobility. Patient did complain of pain in left wrist with supination and patient would not allow therapist to touch her left hand during bed mobility. Patient required maximal assistance to move both lower extremities closer to the edge of the bed and maximal assistance to sit with HOB elevated and significant complaints of increased pain. Upon therapist leaving the room, patient did report her pain was better than during movement. Patient appeared more comfortable.   Pt currently with functional limitations due to the deficits listed below (see PT Problem List). Pt will benefit from skilled PT to increase their independence and safety with mobility to allow discharge to the venue listed below.         Follow Up Recommendations SNF;Supervision/Assistance - 24 hour    Equipment Recommendations  None recommended by PT    Recommendations for Other Services       Precautions / Restrictions Precautions Precautions: Fall Restrictions Weight Bearing Restrictions: Yes LLE Weight Bearing: Weight bearing as tolerated      Mobility  Bed Mobility Overal bed mobility: Needs Assistance Bed Mobility: Rolling;Sidelying to Sit Rolling: Max assist Sidelying to sit: Max assist           Transfers                 General transfer comment: unable to perform due to pain complaints  Ambulation/Gait             General Gait Details: unable to perform due to pain complaints  Stairs            Wheelchair Mobility    Modified Rankin (Stroke Patients Only)       Balance                                             Pertinent Vitals/Pain Pain Assessment: 0-10(flat affect, patient acknowledges pain by nodding when asking but unable to clarify further; refused pain medicines.) Pain Intervention(s): Limited activity within patient's tolerance;Premedicated before session;Monitored during session    Home Living Family/patient expects to be discharged to:: Private residence Living Arrangements: Other relatives;Other (Comment)(Lives with sister) Available Help at Discharge: Family Type of Home: House Home Access: Ramped entrance   Entrance Stairs-Number of Steps: ramp from garage into house; too steep Home Layout: Multi-level;Able to live on main level with bedroom/bathroom Home Equipment: Shower seat;Hand held shower head;Grab bars - tub/shower;Grab bars - toilet;Wheelchair - [employer] - 2 wheels Additional Comments: chair lift    Prior Function Level of Independence: Needs assistance   Gait / Transfers Assistance Needed: household distances with 4 wheeled walker (broken now) or RW; legs would periodically give out and would need help to get up  from floor.  ADL's / Homemaking Assistance Needed: sometimes needs help        Hand Dominance   Dominant Hand: Left    Extremity/Trunk Assessment   Upper Extremity Assessment Upper Extremity Assessment: LUE deficits/detail;Generalized weakness LUE Deficits / Details: c/o left wrist pain with supination    Lower Extremity Assessment Lower Extremity Assessment: Generalized weakness LLE Deficits / Details: left lower extremity WBAT, painful with movement/palpation LLE:  Unable to fully assess due to pain;Unable to fully assess due to immobilization       Communication   Communication: Expressive difficulties  Cognition Arousal/Alertness: Awake/alert Behavior During Therapy: Flat affect Overall Cognitive Status: Impaired/Different from baseline Area of Impairment: Problem solving                             Problem Solving: Slow processing        General Comments      Exercises     Assessment/Plan    PT Assessment Patient needs continued PT services  PT Problem List Decreased strength;Decreased mobility;Decreased activity tolerance;Pain       PT Treatment Interventions DME instruction;Therapeutic activities;Gait training;Therapeutic exercise;Patient/family education;Functional mobility training    PT Goals (Current goals can be found in the Care Plan section)  Acute Rehab PT Goals Patient Stated Goal: less pain, get more mobile before going home, help with proper ramp in garage. PT Goal Formulation: With patient/family Time For Goal Achievement: 08/05/19    Frequency Min 3X/week   Barriers to discharge        Co-evaluation               AM-PAC PT "6 Clicks" Mobility  Outcome Measure Help needed turning from your back to your side while in a flat bed without using bedrails?: Total Help needed moving from lying on your back to sitting on the side of a flat bed without using bedrails?: Total Help needed moving to and from a bed to a chair (including a wheelchair)?: Total Help needed standing up from a chair using your arms (e.g., wheelchair or bedside chair)?: Total Help needed to walk in hospital room?: Total Help needed climbing 3-5 steps with a railing? : Total 6 Click Score: 6    End of Session   Activity Tolerance: Patient limited by pain Patient left: in bed;with call bell/phone within reach;with bed alarm set Nurse Communication: Mobility status PT Visit Diagnosis: Other abnormalities of gait and  mobility (R26.89);Repeated falls (R29.6);Difficulty in walking, not elsewhere classified (R26.2)    Time: [id number] PT Time Calculation (min) (ACUTE ONLY): 750 min   Charges:   PT Evaluation $PT Eval Moderate Complexity: 1 Mod PT Treatments $Therapeutic Activity: 8-22 mins        Floria Raveling. Hartnett-Rands, MS, PT Per [place] [phone] 07/22/2019, 1:30 PM

## 2019-07-22 NOTE — Consult Note (Signed)
[facility]  INPATIENT CONSULTATION  Reason for Consultation: AKI/CKD, concern for uremia Requesting Provider: Dr. Manuella Ghazi  HPI: Victoria Winters is an 68 y.o. female with HTN, DM, h/o CVA, h/o brain anuerysm, h/o frequent UTIs who is currently admitted for falls and AMS.  Patient is seen in consultation by nephrology for evaluation and management of CKD with concern for uremia contributing to AMS.    She presented to [facility] 8/13 with falls and several day history of decreasing mental status .  At baseline she uses RW and sometimes WC but was unable to ambulate prior to admission and actually sustained a fall prompting the ED visit.  She lives with her Winters who is her 10.   She was found to have possible R fibular fracture and L proximal fibular fracture.  Cr 3.26 with K 6.  CT head with no acute issue but old infarct L cerebellar hemisphere, volume loss and chronic white matter changes.  MRI brain with old pons ischemic changes, old infarcts of basal ganglia; chronic white matter changes.   VBG 7.35 / 54, TSH normal, ammonia normal, HIV NR CK 271 UA hazy, mod blood but no blood on microscopy, neg LE and nit.  COVID neg. EEG done - no seizure, mod diffuse encephalopathy secondary to metabolic etiology.   Patient unable to provide any history for me today.  She is unable to state her name despite numerous attempts, repeating over and over "my name is...."  Spoke with patient's Winters via telephone today.  Pt moved to her care several months back.  Was previously living with her daughter Victoria Winters where she had 'many specialists' but her Winters isn't sure which types.  She thinks she used to see a neurologist.  She relates that on most days her Winters knows her name and DOB, but cannot even recall eating a meal 30min prior.  They generally can watch movies together.  She usually uses RW, sometimes WC and sometimes no assistance.  Her short term memory loss is believed to be due to h/o  CVA.   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  Winters says since yesterday she hasn't been eating much and had 'itchy eyebrows.'    She generally maintains hydration, does not take any NSAIDs.    She was supposed to see a nephrologist who staffs at [facility] but due to covid this has been delayed numerous times and is supposed to be in 08/2019 now.   8/13 Cr 3.26 (UOP 500), K 6 > 8/14 3.38 (UOP 1200) > 8/15 2.91.   PMH: Past Medical History:  Diagnosis Date  . Brain aneurysm   . Diabetes mellitus without complication ([place])   . Hallucinations    visual and auditory  . Memory loss   . Recurrent UTI   . Renal disorder   . Stroke [facility])    PSH: Past Surgical History:  Procedure Laterality Date  . CHOLECYSTECTOMY    . SKIN GRAFT      Past Medical History:  Diagnosis Date  . Brain aneurysm   . Diabetes mellitus without complication ([place])   . Hallucinations    visual and auditory  . Memory loss   . Recurrent UTI   . Renal disorder   . Stroke [facility])     Medications:  I have reviewed the patient's current medications.  Medications Prior to Admission  Medication Sig Dispense Refill  . acetaminophen-codeine (TYLENOL #4) 300-60 MG tablet Take 1 tablet by mouth every 4 (four) hours as needed for moderate pain.    Marland Kitchen aspirin EC 81 MG tablet Take 81 mg by mouth every morning.    Marland Kitchen atorvastatin (LIPITOR) 40 MG tablet Take 40 mg by mouth at bedtime.    . cloNIDine (CATAPRES - DOSED IN MG/24 HR) 0.2 mg/24hr patch Place 0.2 mg onto the skin every Sunday.    . clopidogrel (PLAVIX) 75 MG tablet Take 75 mg by mouth every morning.    . Cranberry-Vitamin C-Vitamin E (CRANBERRY PLUS VITAMIN C) 4200-20-3 MG-MG-UNIT CAPS Take 1 capsule by mouth daily as needed (urinary tract health).    . doxycycline (VIBRA-TABS) 100  MG tablet Take 100 mg by mouth every morning.    . escitalopram (LEXAPRO) 10 MG tablet Take 10 mg by mouth every morning.    . furosemide (LASIX) 40 MG tablet Take 40 mg by mouth every morning.    . hydrALAZINE (APRESOLINE) 25 MG tablet Take 25 mg by mouth 3 (three) times daily.    . insulin glargine (LANTUS) 100 UNIT/ML injection Inject 30 Units into the skin at bedtime.    . insulin lispro (HUMALOG) 100 UNIT/ML injection Inject 1-17 Units into the skin 3 (three) times daily before meals. Sliding scale starting at; 180-220= 4 units 221-260= 6 units 261-300= 8 units 301-340=10units 341-380=12units    . isosorbide mononitrate (IMDUR) 30 MG 24 hr tablet Take 30 mg by mouth every morning.    . linagliptin (TRADJENTA) 5 MG TABS tablet Take 5 mg by mouth every morning.    . loratadine (CLARITIN) 10 MG tablet Take 10 mg by mouth every morning.    . Melatonin 1 MG TABS Take 1 mg by mouth at bedtime.    . metoprolol (TOPROL-XL) 200 MG 24 hr tablet Take 200 mg by mouth every morning.    . Multiple Vitamin (MULTIVITAMIN WITH MINERALS) TABS tablet Take 1 tablet by mouth daily.    Marland Kitchen omeprazole (PRILOSEC) 20 MG capsule Take 20 mg by mouth every morning.    . pregabalin (LYRICA) 75 MG capsule Take 75 mg by mouth 2 (two) times daily.      ALLERGIES:   Allergies  Allergen Reactions  . Penicillins Anaphylaxis    Did it involve swelling of the face/tongue/throat, SOB, or low BP? Yes Did it involve sudden or severe rash/hives, skin peeling, or any reaction on the inside of your mouth or nose? Unknown Did you need to seek medical attention at a hospital or doctor's office? Unknown When did it last happen?Within past 10 years If all above answers are "NO", may proceed with cephalosporin use.   . Chicken Allergy   . Eggs Or Egg-Derived Products Swelling  . Milk-Related Compounds   . Other     Black beans, cats, dogs, grass clippings  . Wheat Bran     FAM HX: Family History  Problem Relation Age  of Onset  . Heart disease Mother   . Diabetes Mellitus II Mother   . Heart disease Father   . Diabetes Father   . Heart disease Brother   . Cancer Other     Social History:   reports that she has quit smoking. She has never used smokeless tobacco. She reports current alcohol use. She reports that she does not use drugs.  ROS: unable to obtain from  patient today due to lack of ability to answer questions  Blood pressure (!) 147/56, pulse 66, temperature 99.8 F (37.7 C), temperature source Oral, resp. rate 18, height 5\' 7"  (1.702 m), weight 105 kg, SpO2 92 %. PHYSICAL EXAM: Gen: comfortably sitting in bed  Eyes: anicteric, EOMI ENT: MMM Neck: supple, no JVD CV: RRR, no rub Abd:  Soft, nontender Lungs: normal WOB, no rales Extr: no edema Neuro: unable to check for asterixis as pt unable to cooperate with exam.  Unable to check for ankle clonus due to LE pain (presumably with fractures) Skin: warm and dry, no rashes or lesions   Results for orders placed or performed during the hospital encounter of 07/20/19 (from the past 48 hour(s))  CBC with Differential     Status: Abnormal   Collection Time: 07/20/19  1:53 PM  Result Value Ref Range   WBC 13.4 (H) 4.0 - 10.5 K/uL   RBC 3.79 (L) 3.87 - 5.11 MIL/uL   Hemoglobin 10.5 (L) 12.0 - 15.0 g/dL   HCT 33.7 (L) 36.0 - 46.0 %   MCV 88.9 80.0 - 100.0 fL   MCH 27.7 26.0 - 34.0 pg   MCHC 31.2 30.0 - 36.0 g/dL   RDW 14.1 11.5 - 15.5 %   Platelets 191 150 - 400 K/uL   nRBC 0.0 0.0 - 0.2 %   Neutrophils Relative % 62 %   Neutro Abs 8.3 (H) 1.7 - 7.7 K/uL   Lymphocytes Relative 24 %   Lymphs Abs 3.2 0.7 - 4.0 K/uL   Monocytes Relative 8 %   Monocytes Absolute 1.1 (H) 0.1 - 1.0 K/uL   Eosinophils Relative 5 %   Eosinophils Absolute 0.6 (H) 0.0 - 0.5 K/uL   Basophils Relative 0 %   Basophils Absolute 0.1 0.0 - 0.1 K/uL   Immature Granulocytes 1 %   Abs Immature Granulocytes 0.09 (H) 0.00 - 0.07 K/uL    Comment: Performed at [facility], [address] [place], [place] [address]  Comprehensive metabolic panel     Status: Abnormal   Collection Time: 07/20/19  1:53 PM  Result Value Ref Range   Sodium 140 135 - 145 mmol/L   Potassium 6.0 (H) 3.5 - 5.1 mmol/L   Chloride 104 98 - 111 mmol/L   CO2 26 22 - 32 mmol/L   Glucose, Bld 192 (H) 70 - 99 mg/dL   BUN 63 (H) 8 - 23 mg/dL   Creatinine, Ser 3.26 (H) 0.44 - 1.00 mg/dL   Calcium 8.2 (L) 8.9 - 10.3 mg/dL   Total Protein 6.4 (L) 6.5 - 8.1 g/dL   Albumin 3.3 (L) 3.5 - 5.0 g/dL   AST 20 15 - 41 U/L   ALT 19 0 - 44 U/L   Alkaline Phosphatase 82 38 - 126 U/L   Total Bilirubin 0.6 0.3 - 1.2 mg/dL   GFR calc non Af Amer 14 (L) >60 mL/min   GFR calc Af Amer 16 (L) >60 mL/min   Anion gap 10 5 - 15    Comment: Performed at [facility], [address], [place], [place] [address]  Troponin I (High Sensitivity)     Status: None   Collection Time: 07/20/19  1:53 PM  Result Value Ref Range   Troponin I (High Sensitivity) 4 <18 ng/L    Comment: (NOTE) Elevated high sensitivity troponin I (hsTnI) values and significant  changes across serial measurements may suggest ACS but many other  chronic and acute conditions are  known to elevate hsTnI results.  Refer to the "Links" section for chest pain algorithms and additional  guidance. Performed at [facility], [address] [place], [place] [address]   Urinalysis, Routine w reflex microscopic     Status: Abnormal   Collection Time: 07/20/19  1:53 PM  Result Value Ref Range   Color, Urine YELLOW YELLOW   APPearance HAZY (A) CLEAR   Specific Gravity, Urine 1.009 1.005 - 1.030   pH 6.0 5.0 - 8.0   Glucose, UA NEGATIVE NEGATIVE mg/dL   Hgb urine dipstick MODERATE (A) NEGATIVE   Bilirubin Urine NEGATIVE NEGATIVE   Ketones, ur NEGATIVE NEGATIVE mg/dL   Protein, ur NEGATIVE NEGATIVE mg/dL   Nitrite NEGATIVE NEGATIVE   Leukocytes,Ua NEGATIVE NEGATIVE   RBC / HPF 0-5 0 - 5 RBC/hpf   WBC, UA 0-5 0 - 5 WBC/hpf   Bacteria, UA  NONE SEEN NONE SEEN   Squamous Epithelial / LPF 0-5 0 - 5    Comment: Performed at [facility], [address], [place], [place] [address]  CK     Status: Abnormal   Collection Time: 07/20/19  1:53 PM  Result Value Ref Range   Total CK 271 (H) 38 - 234 U/L    Comment: Performed at [facility], [address], [place], [place] [address]  Troponin I (High Sensitivity)     Status: None   Collection Time: 07/20/19  4:31 PM  Result Value Ref Range   Troponin I (High Sensitivity) 4 <18 ng/L    Comment: (NOTE) Elevated high sensitivity troponin I (hsTnI) values and significant  changes across serial measurements may suggest ACS but many other  chronic and acute conditions are known to elevate hsTnI results.  Refer to the "Links" section for chest pain algorithms and additional  guidance. Performed at [facility], [address] [place], [place] [address]   Brain natriuretic peptide     Status: Abnormal   Collection Time: 07/20/19  4:31 PM  Result Value Ref Range   B Natriuretic Peptide 109.0 (H) 0.0 - 100.0 pg/mL    Comment: Performed at [facility], [address], [place], [place] [address]  Potassium     Status: Abnormal   Collection Time: 07/20/19  4:31 PM  Result Value Ref Range   Potassium 5.7 (H) 3.5 - 5.1 mmol/L    Comment: Performed at [facility], [address] [place], [place] [address]  HIV antibody (Routine Testing)     Status: None   Collection Time: 07/20/19  4:31 PM  Result Value Ref Range   HIV Screen 4th Generation wRfx Non Reactive Non Reactive    Comment: (NOTE) Performed At: [facility] [address] [place], [place] [id number] Rush Farmer MD [id number]   SARS Coronavirus 2 [facility] order, Performed in [facility] hospital lab) Nasopharyngeal Nasopharyngeal Swab     Status: None   Collection Time: 07/20/19  5:53 PM   Specimen: Nasopharyngeal Swab  Result Value Ref Range   SARS Coronavirus 2 NEGATIVE NEGATIVE    Comment: (NOTE) If result  is NEGATIVE SARS-CoV-2 target nucleic acids are NOT DETECTED. The SARS-CoV-2 RNA is generally detectable in upper and lower  respiratory specimens during the acute phase of infection. The lowest  concentration of SARS-CoV-2 viral copies this assay can detect is 250  copies / mL. A negative result does not preclude SARS-CoV-2 infection  and should not be used as the sole basis for treatment or other  patient management decisions.  A negative result may occur with  improper specimen collection / handling, submission of specimen other  than nasopharyngeal swab, presence of viral mutation(s) within the  areas targeted by this assay, and inadequate number of viral copies  (<250 copies / mL). A negative result must be combined with clinical  observations, patient history, and epidemiological information. If result is POSITIVE SARS-CoV-2 target nucleic acids are DETECTED. The SARS-CoV-2 RNA is generally detectable in upper and lower  respiratory specimens dur ing the acute phase of infection.  Positive  results are indicative of active infection with SARS-CoV-2.  Clinical  correlation with patient history and other diagnostic information is  necessary to determine patient infection status.  Positive results do  not rule out bacterial infection or co-infection with other viruses. If result is PRESUMPTIVE POSTIVE SARS-CoV-2 nucleic acids MAY BE PRESENT.   A presumptive positive result was obtained on the submitted specimen  and confirmed on repeat testing.  While 2019 novel coronavirus  (SARS-CoV-2) nucleic acids may be present in the submitted sample  additional confirmatory testing may be necessary for epidemiological  and / or clinical management purposes  to differentiate between  SARS-CoV-2 and other Sarbecovirus currently known to infect humans.  If clinically indicated additional testing with an alternate test  methodology [phone]) is advised. The SARS-CoV-2 RNA is generally   detectable in upper and lower respiratory sp ecimens during the acute  phase of infection. The expected result is Negative. Fact Sheet for Patients:  StrictlyIdeas.no Fact Sheet for Healthcare Providers: BankingDealers.co.za This test is not yet approved or cleared by the [place] FDA and has been authorized for detection and/or diagnosis of SARS-CoV-2 by FDA under an Emergency Use Authorization (EUA).  This EUA will remain in effect (meaning this test can be used) for the duration of the COVID-19 declaration under Section 564(b)(1) of the Act, 21 U.S.C. section 360bbb-3(b)(1), unless the authorization is terminated or revoked sooner. Performed at [facility], [address] [place], [place] [address]   CBG monitoring, ED     Status: Abnormal   Collection Time: 07/20/19  7:45 PM  Result Value Ref Range   Glucose-Capillary 131 (H) 70 - 99 mg/dL  CBC     Status: Abnormal   Collection Time: 07/21/19  5:57 AM  Result Value Ref Range   WBC 9.8 4.0 - 10.5 K/uL   RBC 3.32 (L) 3.87 - 5.11 MIL/uL   Hemoglobin 9.3 (L) 12.0 - 15.0 g/dL   HCT 30.1 (L) 36.0 - 46.0 %   MCV 90.7 80.0 - 100.0 fL   MCH 28.0 26.0 - 34.0 pg   MCHC 30.9 30.0 - 36.0 g/dL   RDW 14.2 11.5 - 15.5 %   Platelets 173 150 - 400 K/uL   nRBC 0.0 0.0 - 0.2 %    Comment: Performed at [facility], [address], [place], [place] [id number]  Basic metabolic panel     Status: Abnormal   Collection Time: 07/21/19  5:57 AM  Result Value Ref Range   Sodium 142 135 - 145 mmol/L   Potassium 4.6 3.5 - 5.1 mmol/L    Comment: DELTA CHECK NOTED   Chloride 106 98 - 111 mmol/L   CO2 28 22 - 32 mmol/L   Glucose, Bld 106 (H) 70 - 99 mg/dL   BUN 61 (H) 8 - 23 mg/dL   Creatinine, Ser 3.38 (H) 0.44 - 1.00 mg/dL   Calcium 8.1 (L) 8.9 - 10.3 mg/dL   GFR calc non Af  Amer 13 (L) >60 mL/min   GFR calc Af Amer 15 (L) >60 mL/min   Anion gap 8 5 - 15    Comment: Performed at [facility], [address] [place], [place] [address]  Glucose, capillary     Status: None   Collection Time: 07/21/19  7:55 AM  Result Value Ref Range   Glucose-Capillary 95 70 - 99 mg/dL  TSH     Status: None   Collection Time: 07/21/19  1:51 PM  Result Value Ref Range   TSH 1.888 0.350 - 4.500 uIU/mL    Comment: Performed by a 3rd Generation assay with a functional sensitivity of <=0.01 uIU/mL. Performed at [facility], [address], [place], [place] [address]   Ammonia     Status: None   Collection Time: 07/21/19  1:51 PM  Result Value Ref Range   Ammonia 31 9 - 35 umol/L    Comment: Performed at [facility], [address] [place], [place] [address]  Blood gas, venous     Status: Abnormal   Collection Time: 07/21/19  3:52 PM  Result Value Ref Range   FIO2 21.00    pH, Ven 7.348 7.250 - 7.430   pCO2, Ven 54.5 44.0 - 60.0 mmHg   pO2, Ven 41.9 32.0 - 45.0 mmHg   Bicarbonate 26.6 20.0 - 28.0 mmol/L   Acid-Base Excess 3.9 (H) 0.0 - 2.0 mmol/L   O2 Saturation 75.2 %   Patient temperature 36.8     Comment: Performed at [facility], [address], [place], [place] [address]  Glucose, capillary     Status: None   Collection Time: 07/21/19  4:12 PM  Result Value Ref Range   Glucose-Capillary 86 70 - 99 mg/dL   Comment 1 Notify RN    Comment 2 Document in Chart   Glucose, capillary     Status: Abnormal   Collection Time: 07/22/19  7:25 AM  Result Value Ref Range   Glucose-Capillary 128 (H) 70 - 99 mg/dL   Comment 1 Notify RN   Basic metabolic panel     Status: Abnormal   Collection Time: 07/22/19 10:38 AM  Result Value Ref Range   Sodium 144 135 - 145 mmol/L   Potassium 4.5 3.5 - 5.1 mmol/L   Chloride 109 98 - 111 mmol/L   CO2 28 22 - 32 mmol/L   Glucose, Bld 163 (H) 70 - 99 mg/dL   BUN 51 (H) 8 - 23 mg/dL   Creatinine, Ser 2.91 (H) 0.44 - 1.00 mg/dL   Calcium 8.2 (L) 8.9 - 10.3 mg/dL   GFR calc non Af Amer 16 (L) >60 mL/min   GFR calc Af Amer 18 (L) >60 mL/min   Anion gap 7  5 - 15    Comment: Performed at [facility], [address], [place], [place] [address]  CBC     Status: Abnormal   Collection Time: 07/22/19 10:38 AM  Result Value Ref Range   WBC 9.8 4.0 - 10.5 K/uL   RBC 3.63 (L) 3.87 - 5.11 MIL/uL   Hemoglobin 9.8 (L) 12.0 - 15.0 g/dL   HCT 31.9 (L) 36.0 - 46.0 %   MCV 87.9 80.0 - 100.0 fL   MCH 27.0 26.0 - 34.0 pg   MCHC 30.7 30.0 - 36.0 g/dL   RDW 13.6 11.5 - 15.5 %   Platelets 167 150 - 400 K/uL   nRBC 0.0 0.0 - 0.2 %    Comment: Performed at [facility], [address], [place], [place] [address]  Glucose, capillary     Status: Abnormal   Collection Time: 07/22/19 11:16 AM  Result Value Ref Range   Glucose-Capillary 138 (H) 70 - 99 mg/dL    Dg Pelvis 1-2 Views  Result Date: 07/20/2019 CLINICAL DATA:  68 year old female status post fall with pain. EXAM: PELVIS - 1-2 VIEW COMPARISON:  None. FINDINGS: Femoral heads are normally located. Mild asymmetric right hip joint space loss. Grossly intact proximal femurs. Upper pelvis bone detail degraded by soft tissue artifact. No pelvis fracture identified. No acute osseous abnormality identified. Visible bowel gas pattern is within normal limits. IMPRESSION: No acute fracture or dislocation identified about the pelvis. Electronically Signed   By: Genevie Ann M.D.   On: 07/20/2019 19:11   Dg Tibia/fibula Left  Result Date: 07/20/2019 CLINICAL DATA:  68 year old female with fall and trauma to the left lower extremity. EXAM: LEFT TIBIA AND FIBULA - 2 VIEW COMPARISON:  Left knee radiograph dated 07/20/2019 FINDINGS: There is a nondisplaced oblique fracture of the proximal fibular diaphysis. No other acute fracture identified. The bones are osteopenic. There is no dislocation. The ankle mortise is intact. There is diffuse subcutaneous edema. IMPRESSION: Nondisplaced oblique fracture of the proximal fibular diaphysis. Electronically Signed   By: Anner Crete M.D.   On: 07/20/2019 20:52   Dg Tibia/fibula  Right  Result Date: 07/20/2019 CLINICAL DATA:  Fall with fracture in tibia/fibula. EXAM: RIGHT TIBIA AND FIBULA - 2 VIEW COMPARISON:  Knee films of earlier. FINDINGS: Degenerative changes about the lateral aspect of the knee are relatively mild. The irregularity of the fibular neck is less apparent than on dedicated knee radiographs. IMPRESSION: No definite acute osseous abnormality. Correlate with point tenderness, given appearance of the fibular neck on dedicated knee films. Electronically Signed   By: Abigail Miyamoto M.D.   On: 07/20/2019 20:53   Dg Ankle Complete Left  Result Date: 07/21/2019 CLINICAL DATA:  Recent fall, pain and swelling EXAM: LEFT ANKLE COMPLETE - 3+ VIEW COMPARISON:  07/21/2019 FINDINGS: Bones are osteopenic. Mild diffuse soft tissue swelling. Distal tibia and fibula appear intact. On the lateral view, there is cortical irregularity and fragmentation of the dorsal talus cortical surface suspicious for fracture. No ankle malalignment. IMPRESSION: Findings suspicious for nondisplaced dorsal talus fracture on the lateral view. Osteopenia Diffuse soft tissue swelling No malalignment Electronically Signed   By: Jerilynn Mages.  Shick M.D.   On: 07/21/2019 14:42   Ct Head Wo Contrast  Result Date: 07/20/2019 CLINICAL DATA:  Ataxia, fall, history of strokes EXAM: CT HEAD WITHOUT CONTRAST TECHNIQUE: Contiguous axial images were obtained from the base of the skull through the vertex without intravenous contrast. COMPARISON:  None. FINDINGS: Brain: Focus of gliosis in the left cerebellar hemisphere likely reflecting remote infarct no evidence of acute infarction, hemorrhage, hydrocephalus, extra-axial collection or mass lesion/mass effect. Symmetric prominence of the ventricles, cisterns and sulci compatible with parenchymal volume loss. Patchy areas of white matter hypoattenuation are most compatible with chronic microvascular angiopathy. Vascular: Atherosclerotic calcification of the carotid siphons.  Skull: Hyperostosis frontalis interna. No calvarial fracture or suspicious osseous lesion. No scalp swelling or hematoma. Sinuses/Orbits: Mural thickening in the right frontal sinus and anterior right ethmoid air cells. Orbital structures are unremarkable aside from prior lens extractions. Other: None. IMPRESSION: No acute intracranial abnormality. Region of gliosis in the left cerebellar hemisphere, likely remote infarct. Volume loss and chronic white matter changes. Electronically Signed   By: Elwin Sleight.D.  On: 07/20/2019 16:31   Mr Brain Wo Contrast  Result Date: 07/21/2019 CLINICAL DATA:  Altered level of consciousness. History of brain aneurysm. Memory loss. EXAM: MRI HEAD WITHOUT CONTRAST TECHNIQUE: Multiplanar, multiecho pulse sequences of the brain and surrounding structures were obtained without intravenous contrast. COMPARISON:  Head CT yesterday. FINDINGS: Brain: Diffusion imaging does not show any acute or subacute infarction. There are old ischemic changes of the pons. No cerebellar abnormality. Cerebral hemispheres show old infarction in the left basal ganglia and internal capsule and in the right basal ganglia external capsule. Chronic small-vessel changes affect the thalami in the hemispheric white matter. No large vessel territory infarction. Hemosiderin deposition associated with the old basal ganglia infarctions. No sign of recent hemorrhage, hydrocephalus or extra-axial collection. Vascular: Major vessels at the base of the brain show flow. Skull and upper cervical spine: Negative Sinuses/Orbits: Inflammatory changes of the right frontal and ethmoid sinuses. Other sinuses clear. Orbits negative. Other: None IMPRESSION: No acute or reversible finding. Old ischemic changes affecting the pons. Old infarctions in the basal ganglia and radiating white matter tracts, with hemosiderin deposition. Electronically Signed   By: Nelson Chimes M.D.   On: 07/21/2019 12:31   Dg Chest Port 1  View  Result Date: 07/20/2019 CLINICAL DATA:  Diabetes, smoker EXAM: PORTABLE CHEST 1 VIEW COMPARISON:  None. FINDINGS: Cardiomegaly with vascular congestion. No focal consolidation or effusion. No pneumothorax. Patchy sclerosis proximal left humerus, possible infarct or chondral lesion. IMPRESSION: Cardiomegaly with vascular congestion Electronically Signed   By: Donavan Foil M.D.   On: 07/20/2019 19:54   Dg Knee Complete 4 Views Left  Result Date: 07/20/2019 CLINICAL DATA:  Fall with knee pain EXAM: LEFT KNEE - COMPLETE 4+ VIEW COMPARISON:  None. FINDINGS: Mild degenerative change of the medial joint space. Mild patellofemoral degenerative change. Small knee effusion. Acute minimally displaced fracture involving the proximal shaft of the fibula. IMPRESSION: 1. Acute minimally displaced fracture proximal shaft of fibula 2. Mild degenerative changes of the knee.  Small knee effusion Electronically Signed   By: Donavan Foil M.D.   On: 07/20/2019 15:23   Dg Knee Complete 4 Views Right  Result Date: 07/20/2019 CLINICAL DATA:  Fall EXAM: RIGHT KNEE - COMPLETE 4+ VIEW COMPARISON:  None. FINDINGS: Possible acute, subtle slightly impacted fracture at the fibular neck. No dislocation. Mild patellofemoral and lateral joint space degenerative change. Trace knee effusion IMPRESSION: 1. Findings questionable for slightly impacted fracture at the fibular neck 2. Mild degenerative changes with trace knee effusion Electronically Signed   By: Donavan Foil M.D.   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  Presumably arterionephrosclerosis +/- DM. UA unrevealing.  No current indications for RRT, see below.   --Renal US --DM and BP control to optimal levels --Avoid nephrotoxins  **AMS:  Unclear etiology but has quite pronounced underlying memory loss with worsening in the days prior to  admission.  I suspect she has some hospital hypoactive delerium now as well.  Lyrica and lexapro on hold from home meds.  Do not suspect uremia cause of AMS at her level of kidney function, but will complete 24h urine collection of CrCl in case serum creatinine is overestimate of renal function.  She doesn't have any other uremic symptoms (per review with Winters) and typically uremic s/s seen with BUN > 100. I also D/Cd her morphine to help prevent confounding  issue - hopefully APAP can control her pain.    **Anemia:  Normocytic.  Check iron, B12, folate.  Hb 9.8 - not particularly symptomatic.  Consider ESA if worsening.   **BMM: corr ca ok.  Check phos and PTH.   **Nutrition: malnutrition with albumin 3.3; start prostat supplement BID here.   **DM:  Target A1c 7, per primary  **HTN:   Goa 130/80. Currently on clonidine 2 TTS, imdur 30, toprol 200.  Per H&P other home meds include hydralazine 25 TID, lasix 40 daily   Justin Mend 07/22/2019, 1:22 PM

## 2019-07-22 NOTE — Plan of Care (Signed)
  Problem: Acute Rehab PT Goals(only PT should resolve) Goal: Pt will Roll Supine to Side Outcome: Progressing Flowsheets (Taken 07/22/2019 1328) Pt will Roll Supine to Side: with mod assist Goal: Pt Will Go Supine/Side To Sit Outcome: Progressing Flowsheets (Taken 07/22/2019 1328) Pt will go Supine/Side to Sit: with moderate assist Goal: Pt Will Go Sit To Supine/Side Outcome: Progressing Flowsheets (Taken 07/22/2019 1328) Pt will go Sit to Supine/Side: with moderate assist Goal: Patient Will Perform Sitting Balance Outcome: Progressing Flowsheets (Taken 07/22/2019 1328) Patient will perform sitting balance: with moderate assist Goal: Patient Will Transfer Sit To/From Stand Outcome: Progressing Flowsheets (Taken 07/22/2019 1328) Patient will transfer sit to/from stand:  with maximum assist  with +2   Pamala Hurry D. Hartnett-Rands, MS, PT Per [place] [phone] 07/22/2019

## 2019-07-23 ENCOUNTER — Inpatient Hospital Stay (HOSPITAL_COMMUNITY): Payer: Medicare Other

## 2019-07-23 LAB — GLUCOSE, CAPILLARY: Glucose-Capillary: 178 mg/dL — ABNORMAL HIGH (ref 70–99)

## 2019-07-23 NOTE — Progress Notes (Signed)
PROGRESS NOTE    Victoria Winters  [id number] DOB: 1951/05/28 DOA: 07/20/2019 PCP: [facility]   Brief Narrative:  Per HPI: Victoria Winters a 68 y.o.femalewith medical history significant forCVA with residual deficits, diabetes mellitus, brain aneurysms, hallucinations, frequent UTIs, CKD 4-5. History is obtained with the help of [name] who is healthcare power of attorney.The patient is awake and alert, has short-term memory loss due to her stroke and is unable to give me history,but answers simple questions.Patient lives with her sister. Over the past 4 days,patient sister reports increased shakiness, patient sometimes not respondingto questions, not as lucid as previously or as talkative.Patient also reported blurred pain with urination 4 days ago.  Yesterday while patient was using her walker and trying to go up the ramp into the house, patient became tremulous and fell. Since fall patient has not been able to ambulate. At baseline patient ambulates with walker and sometimes wheelchair. No vomiting, no loose stools, patient admitted good p.o. intake,no difficulty breathing, no cough no chest pain,no fever no chills.. Sister reports wound to patient's buttock area.  Patient sister also reports patient has an appointment(1st)to see a nephrologist but because ofCoVID, herappointment dateand change several times and is now in September.  Patient was admitted with what appears to be acute on chronic metabolic encephalopathy and associated fall that has resulted in a proximal fibula fracture on her left knee. Orthopedics recommends weightbearing and checking ankle films to ensure that there is no high ankle sprain present.  8/15: Checked ankle film with nondisplaced dorsal talus fracture noted on lateral view on the left side.  Discussed with Dr. Stann Mainland with orthopedics on-call who states that this is simply a result of her ankle sprain and  should be treated as such and she should be allowed to weight-bear.  Assessed by PT with recommendations for SNF.  EEG with no signs of seizure activity noted.  Appreciate nephrology evaluation for possible uremic encephalopathy although, this is likely not the case.  8/16: Appreciate ongoing nephrology work-up.  Ongoing encephalopathy noted which does not appear to be related to uremia per nephrology.  Morphine discontinued on 8/15.  May require further neurology evaluation, but there is suspicion for delirium.  Will require SNF on discharge.   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  Will discuss further with sister to get details and consider neurology evaluation if needed.  Mechanical fall with subsequent proximal fibula fracture on left side -Spoke with orthopedist Dr. Tresa Moore who recommends weightbearing as tolerated and left ankle imaging to assess for high ankle fracture on 8/14 -Discussed again on 8/15 with Dr. Stann Mainland regarding nondisplaced dorsal talus fracture on ankle film.  He has recommended weightbearing and ambulation as tolerated as this is likely secondary to ankle sprain -PT assessment appreciated with recommendations for SNF on discharge  CKD stage IV-Vwith hyperkalemia and elevated BUN-improved -Patient has  received some IV fluid with home  Lasix held -She appears to have had 1200 cc of urine output -Repeat renal panel pending -Appreciate nephrology evaluation with 24-hour creatinine clearance  Type 2 diabetes -Glucose levels are improved and will maintain on Lantus 10 units daily for now and increase as needed. -Continue home Tradjenta -Continue SSI coverage  History of CVA/brain aneurysm/hallucinations -Patient does have short-term memory loss from prior stroke as well as intermittent hallucinations -Continue prior statin, aspirin, and Plavix  Hypertension-stable -Continue home Imdur, metoprolol, and clonidine -Holding hydralazine and Lasix for now   DVT prophylaxis:SCDs Code Status:Full Family Communication: Discussed with sister at bedside on 8/15 Disposition Plan: Appreciate PT evaluation with recommendations for SNF on discharge.  Appreciate nephrology evaluation with 24-hour creatinine clearance and other studies as ordered   Consultants:  Orthopedics on phone  Nephrology  Procedures:  As above with EEG on 8/14 with no seizure activity  Antimicrobials:   None  Subjective: Patient seen and evaluated today with no new acute complaints or concerns. No acute concerns or events noted overnight.  She appears to be refusing her medications this morning.  Objective: Vitals:   07/21/19 2144 07/22/19 0632 07/22/19 2142 07/23/19 0512  BP: (!) 148/56 (!) 147/56 (!) 126/45 (!) 130/98  Pulse: 64 66 64 66  Resp: 18 18 18 20   Temp: 98 F (36.7 C) 99.8 F (37.7 C) 98.7 F (37.1 C) 98.4 F (36.9 C)  TempSrc: Oral Oral Oral Oral  SpO2: 96% 92% 95% 94%  Weight:      Height:        Intake/Output Summary (Last 24 hours) at 07/23/2019 1018 Last data filed at 07/23/2019 0500 Gross per 24 hour  Intake 420 ml  Output 1200 ml  Net -780 ml   Filed Weights   07/20/19 2106 07/21/19 0100  Weight: 105 kg 105 kg    Examination:  General exam: Appears calm and comfortable, confused and cannot  give any meaningful history Respiratory system: Clear to auscultation. Respiratory effort normal. Cardiovascular system: S1 & S2 heard, RRR. No JVD, murmurs, rubs, gallops or clicks. No pedal edema. Gastrointestinal system: Abdomen is nondistended, soft and nontender. No organomegaly or masses felt. Normal bowel sounds heard. Central nervous system: Alert Extremities: No significant edema Skin: No rashes, lesions or ulcers Psychiatry: Cannot be assessed    Data Reviewed: I have personally reviewed following labs and imaging studies  CBC: Recent Labs  Lab 07/20/19 1353 07/21/19 0557 07/22/19 1038  WBC 13.4* 9.8 9.8  NEUTROABS 8.3*  --   --   HGB 10.5* 9.3* 9.8*  HCT 33.7* 30.1* 31.9*  MCV 88.9 90.7 87.9  PLT 191 173 [id number]   Basic Metabolic Panel: Recent Labs  Lab 07/20/19 1353 07/20/19 1631 07/21/19 0557 07/22/19 1038  NA 140  --  142 144  K 6.0* 5.7* 4.6 4.5  CL 104  --  106 109  CO2 26  --  28 28  GLUCOSE 192*  --  106* 163*  BUN 63*  --  61* 51*  CREATININE 3.26*  --  3.38* 2.91*  CALCIUM 8.2*  --  8.1* 8.2*   GFR: Estimated Creatinine Clearance: 23.1 mL/min (A) (by C-G formula based on SCr of 2.91 mg/dL (H)). Liver Function Tests: Recent Labs  Lab 07/20/19 1353  AST 20  ALT 19  ALKPHOS 82  BILITOT 0.6  PROT 6.4*  ALBUMIN 3.3*   No results for input(s): LIPASE, AMYLASE in the last 168 hours. Recent Labs  Lab 07/21/19 1351  AMMONIA 31   Coagulation Profile: No results for input(s): INR, PROTIME in the last 168 hours. Cardiac Enzymes: Recent Labs  Lab 07/20/19 1353  CKTOTAL 271*   BNP (last 3 results) No results for input(s): PROBNP in the last 8760 hours. HbA1C: No results for input(s): HGBA1C in the last 72 hours. CBG: Recent Labs  Lab 07/21/19 1612 07/22/19 0725 07/22/19 1116 07/22/19 1601 07/22/19 2141  GLUCAP 86 128* 138* 202* 109*   Lipid Profile: No results for input(s): CHOL, HDL, LDLCALC, TRIG, CHOLHDL, LDLDIRECT in the last  72 hours. Thyroid Function Tests: Recent Labs    07/21/19 1351  TSH 1.888   Anemia Panel: No results for input(s): VITAMINB12, FOLATE, FERRITIN, TIBC, IRON, RETICCTPCT in the last 72 hours. Sepsis Labs: No results for input(s): PROCALCITON, LATICACIDVEN in the last 168 hours.  Recent Results (from the past 240 hour(s))  SARS Coronavirus 2 [facility] order, Performed in [facility] hospital lab) Nasopharyngeal Nasopharyngeal Swab     Status: None   Collection Time: 07/20/19  5:53 PM   Specimen: Nasopharyngeal Swab  Result Value Ref Range Status   SARS Coronavirus 2 NEGATIVE NEGATIVE Final    Comment: (NOTE) If result is NEGATIVE SARS-CoV-2 target nucleic acids are NOT DETECTED. The SARS-CoV-2 RNA is generally detectable in upper and lower  respiratory specimens during the acute phase of infection. The lowest  concentration of SARS-CoV-2 viral copies this assay can detect is 250  copies / mL. A negative result does not preclude SARS-CoV-2 infection  and should not be used as the sole basis for treatment or other  patient management decisions.  A negative result may occur with  improper specimen collection / handling, submission of specimen other  than nasopharyngeal swab, presence of viral mutation(s) within the  areas targeted by this assay, and inadequate number of viral copies  (<250 copies / mL). A negative result must be combined with clinical  observations, patient history, and epidemiological information. If result is POSITIVE SARS-CoV-2 target nucleic acids are DETECTED. The SARS-CoV-2 RNA is generally detectable in upper and lower  respiratory specimens dur ing the acute phase of infection.  Positive  results are indicative of active infection with SARS-CoV-2.  Clinical  correlation with patient history and other diagnostic information is  necessary to determine patient infection status.  Positive results do  not rule out bacterial infection or co-infection with other  viruses. If result is PRESUMPTIVE POSTIVE SARS-CoV-2 nucleic acids MAY BE PRESENT.   A presumptive positive result was obtained on the submitted specimen  and confirmed on repeat testing.  While 2019 novel coronavirus  (SARS-CoV-2) nucleic acids may be present in the submitted sample  additional confirmatory testing may be necessary for epidemiological  and / or clinical management purposes  to differentiate between  SARS-CoV-2 and other Sarbecovirus currently known to infect humans.  If clinically indicated additional testing with an alternate test  methodology [phone]) is advised. The SARS-CoV-2 RNA is generally  detectable in upper and lower respiratory sp ecimens during the acute  phase of infection. The expected result is Negative. Fact Sheet for Patients:  StrictlyIdeas.no Fact Sheet for Healthcare Providers: BankingDealers.co.za This test is not yet approved or cleared by the [place] FDA and has been authorized for detection and/or diagnosis of SARS-CoV-2 by FDA under an Emergency Use Authorization (EUA).  This EUA will remain in effect (meaning this test can be used) for the duration of the COVID-19 declaration under Section 564(b)(1) of the  Act, 21 U.S.C. section 360bbb-3(b)(1), unless the authorization is terminated or revoked sooner. Performed at [facility], [address], [place], [place] [address]          Radiology Studies: Dg Ankle Complete Left  Result Date: 07/21/2019 CLINICAL DATA:  Recent fall, pain and swelling EXAM: LEFT ANKLE COMPLETE - 3+ VIEW COMPARISON:  07/21/2019 FINDINGS: Bones are osteopenic. Mild diffuse soft tissue swelling. Distal tibia and fibula appear intact. On the lateral view, there is cortical irregularity and fragmentation of the dorsal talus cortical surface suspicious for fracture. No ankle malalignment. IMPRESSION: Findings suspicious for nondisplaced dorsal talus fracture on the  lateral view. Osteopenia Diffuse soft tissue swelling No malalignment Electronically Signed   By: Jerilynn Mages.  Shick M.D.   On: 07/21/2019 14:42   Mr Brain Wo Contrast  Result Date: 07/21/2019 CLINICAL DATA:  Altered level of consciousness. History of brain aneurysm. Memory loss. EXAM: MRI HEAD WITHOUT CONTRAST TECHNIQUE: Multiplanar, multiecho pulse sequences of the brain and surrounding structures were obtained without intravenous contrast. COMPARISON:  Head CT yesterday. FINDINGS: Brain: Diffusion imaging does not show any acute or subacute infarction. There are old ischemic changes of the pons. No cerebellar abnormality. Cerebral hemispheres show old infarction in the left basal ganglia and internal capsule and in the right basal ganglia external capsule. Chronic small-vessel changes affect the thalami in the hemispheric white matter. No large vessel territory infarction. Hemosiderin deposition associated with the old basal ganglia infarctions. No sign of recent hemorrhage, hydrocephalus or extra-axial collection. Vascular: Major vessels at the base of the brain show flow. Skull and upper cervical spine: Negative Sinuses/Orbits: Inflammatory changes of the right frontal and ethmoid sinuses. Other sinuses clear. Orbits negative. Other: None IMPRESSION: No acute or reversible finding. Old ischemic changes affecting the pons. Old infarctions in the basal ganglia and radiating white matter tracts, with hemosiderin deposition. Electronically Signed   By: Nelson Chimes M.D.   On: 07/21/2019 12:31        Scheduled Meds:  aspirin EC  81 mg Oral q morning - 10a   atorvastatin  40 mg Oral QHS   cloNIDine  0.2 mg Transdermal Q Sun   clopidogrel  75 mg Oral q morning - 10a   doxycycline  100 mg Oral q morning - 10a   enoxaparin (LOVENOX) injection  30 mg Subcutaneous Q24H   insulin aspart  0-15 Units Subcutaneous TID WC   insulin glargine  10 Units Subcutaneous QHS   isosorbide mononitrate  30 mg Oral q  morning - 10a   linagliptin  5 mg Oral q morning - 10a   loratadine  10 mg Oral q morning - 10a   metoprolol  200 mg Oral q morning - 10a   pantoprazole  40 mg Oral Daily   Continuous Infusions:   LOS: 2 days    Time spent: 30 minutes    Gerome Kokesh Darleen Crocker, DO [facility] Pager [phone]  If 7PM-7AM, please contact night-coverage [email or web address] Password [facility] 07/23/2019, 10:18 AM

## 2019-07-23 NOTE — Progress Notes (Signed)
Patient let nurse assess lung sounds and abdomen sounds. Nurse asked patient if she would take her 2200 medicine patient nodded and said yes. Nurse scanned patient and medicine in. Nurse went to give patient her 2200 dose of Lipitor patient then refused medicine. Will continue to monitor throughout shift.

## 2019-07-23 NOTE — Progress Notes (Signed)
Patient refused to let nurse tech get 2200 CBG. Mid level made aware. Will continue to monitor throughout shift.

## 2019-07-23 NOTE — Progress Notes (Signed)
Patient refused all medication, lab draws, or to eat her meals today.  Patient refused to let staff check her am blood sugar but did allow her renal ultrasound to be done.  Patient's sister was called and came in to see if she could get patient to take medications or eat but patient continued to refuse. MD aware

## 2019-07-24 LAB — BASIC METABOLIC PANEL
Anion gap: 11 (ref 5–15)
BUN: 39 mg/dL — ABNORMAL HIGH (ref 8–23)
CO2: 24 mmol/L (ref 22–32)
Calcium: 8.7 mg/dL — ABNORMAL LOW (ref 8.9–10.3)
Chloride: 109 mmol/L (ref 98–111)
Creatinine, Ser: 2.4 mg/dL — ABNORMAL HIGH (ref 0.44–1.00)
GFR calc Af Amer: 23 mL/min — ABNORMAL LOW (ref >60)
GFR calc non Af Amer: 20 mL/min — ABNORMAL LOW (ref >60)
Glucose, Bld: 194 mg/dL — ABNORMAL HIGH (ref 70–99)
Potassium: 4.2 mmol/L (ref 3.5–5.1)
Sodium: 144 mmol/L (ref 135–145)

## 2019-07-24 LAB — GLUCOSE, CAPILLARY
Glucose-Capillary: 197 mg/dL — ABNORMAL HIGH (ref 70–99)
Glucose-Capillary: 204 mg/dL — ABNORMAL HIGH (ref 70–99)

## 2019-07-24 LAB — IRON AND TIBC
Iron: 24 ug/dL — ABNORMAL LOW (ref 28–170)
Saturation Ratios: 10 % — ABNORMAL LOW (ref 10.4–31.8)
TIBC: 234 ug/dL — ABNORMAL LOW (ref 250–450)
UIBC: 210 ug/dL

## 2019-07-24 LAB — FERRITIN: Ferritin: 96 ng/mL (ref 11–307)

## 2019-07-24 MED ORDER — NYSTATIN 100000 UNIT/GM EX POWD
Freq: Three times a day (TID) | CUTANEOUS | Status: DC | PRN
  Filled 2019-07-24: qty 15

## 2019-07-24 NOTE — Progress Notes (Signed)
Patient refused CBG stick. Her sister was present and we talked to her daughter on the phone and patient still refused.

## 2019-07-24 NOTE — Progress Notes (Signed)
Patient refused to let me draw her CBG.

## 2019-07-24 NOTE — Progress Notes (Signed)
PROGRESS NOTE    Victoria Winters  [id number] DOB: 1951/10/16 DOA: 07/20/2019 PCP: [facility]   Brief Narrative:  Per HPI: Victoria Winters a 68 y.o.femalewith medical history significant forCVA with residual deficits, diabetes mellitus, brain aneurysms, hallucinations, frequent UTIs, CKD 4-5. History is obtained with the help of sister-Victoria Winters who is healthcare power of attorney.The patient is awake and alert, has short-term memory loss due to her stroke and is unable to give me history,but answers simple questions.Patient lives with her sister. Over the past 4 days,patient sister reports increased shakiness, patient sometimes not respondingto questions, not as lucid as previously or as talkative.Patient also reported blurred pain with urination 4 days ago.  Yesterday while patient was using her walker and trying to go up the ramp into the house, patient became tremulous and fell. Since fall patient has not been able to ambulate. At baseline patient ambulates with walker and sometimes wheelchair. No vomiting, no loose stools, patient admitted good p.o. intake,no difficulty breathing, no cough no chest pain,no fever no chills.. Sister reports wound to patient's buttock area.  Patient sister also reports patient has an appointment(1st)to see a nephrologist but because ofCoVID, herappointment dateand change several times and is now in September.  Patient was admitted with what appears to be acute on chronic metabolic encephalopathy and associated fall that has resulted in a proximal fibula fracture on her left knee. Orthopedics recommends weightbearing and checking ankle films to ensure that there is no high ankle sprain present.  8/15:Checked ankle film with nondisplaced dorsal talus fracture noted on lateral view on the left side. Discussed with Dr. Stann Mainland with orthopedics on-call who states that this is simply a result of her ankle sprain and  should be treated as such and she should be allowed to weight-bear. Assessed by PT with recommendations for SNF. EEG with no signs of seizure activity noted. Appreciate nephrology evaluation for possible uremic encephalopathy although, this is likely not the case.  8/16: Appreciate ongoing nephrology work-up.  Ongoing encephalopathy noted which does not appear to be related to uremia per nephrology.  Morphine discontinued on 8/15.  May require further neurology evaluation, but there is suspicion for delirium.  Will require SNF on discharge.   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   Brain aneurysm   DM (diabetes mellitus) (HCC)   Bilateral fibular fractures   CVA (cerebral vascular accident) ([place])   Hallucination   Acute encephalopathy   Acute on chronic metabolic encephalopathy-unknown etiology -CT head without any acute abnormalities and brain MRI with no acute abnormalities noted -UA not suggestive of infection -TSH noted to be 1.88 and ammonia 31 -Appreciate ongoing nephrology evaluation with creatinine clearance pending, but no current suspicion for uremic encephalopathy-not a candidate for hemodialysis -EEG with no seizure activity noted -Possible delirium component on top of prior memory loss from CVA.    Appreciate neurology evaluation to assess for any possible organic etiology. -Appreciate palliative care evaluation for possible need for hospice soon as it is uncertain how much patient will participate in rehab.  Mechanical fall with subsequent proximal fibula fracture on left side -Spoke with orthopedist Dr. Tresa Moore who recommends weightbearing as tolerated and left ankle imaging to assess for high ankle fractureon 8/14 -Discussed again on 8/15 with Dr. Stann Mainland regarding nondisplaced dorsal talus fracture on ankle film. He has recommended weightbearing and ambulation as tolerated as this is likely secondary to  ankle  sprain -PTassessment appreciated with recommendations for SNF on discharge  CKD stage IV-Vwith hyperkalemiaand elevated BUN-improved -Patient has received some IV fluid with home Lasix held -She appears to have had 1200 cc of urine output -Repeat renal panel pending -Appreciate nephrology evaluation with 24-hour creatinine clearance  Type 2 diabetes -Glucose levelsare improved and will maintain on Lantus 10 units daily for now and increase as needed. -Continue home Tradjenta -Continue SSI coverage  History of CVA/brain aneurysm/hallucinations -Patient does have short-term memory loss from prior stroke as well as intermittent hallucinations -Continue prior statin, aspirin, and Plavix -Appreciate neurology evaluation of any organic etiology  Hypertension- mildly elevated -Continue home Imdur, metoprolol, and clonidine -Unfortunately, patient will not take her current medications   DVT prophylaxis:SCDs Code Status:Full Family Communication:Discussed with sister at bedside on 8/15 Disposition Plan:Appreciate PT evaluation with recommendations for SNF on discharge. Appreciate nephrology evaluation with 24-hour creatinine clearance and other studies as ordered currently pending.  No signs of uremic encephalopathy noted, and uncertain about confusion.  Will consult neurology to further evaluate prior to discharge.   Consultants:  Orthopedics on phone  Nephrology  Neurology  Palliative care  Procedures:  As above with EEG on 8/14 with no seizure activity  Antimicrobials:   None  Subjective: Patient seen and evaluated today with ongoing confusion.  She is refusing her medications.  No other acute events noted overnight.  Objective: Vitals:   07/23/19 0512 07/23/19 1426 07/23/19 2132 07/24/19 0534  BP: (!) 130/98 (!) 173/78 (!) 173/83 (!) 160/81  Pulse: 66 77 74 82  Resp: 20 18 20 20   Temp: 98.4 F (36.9 C) 98.6 F (37 C) 98.9 F (37.2 C)  98.9 F (37.2 C)  TempSrc: Oral Oral Oral Oral  SpO2: 94% 95% 93% 97%  Weight:      Height:        Intake/Output Summary (Last 24 hours) at 07/24/2019 1500 Last data filed at 07/24/2019 0500 Gross per 24 hour  Intake 120 ml  Output 1900 ml  Net -1780 ml   Filed Weights   07/20/19 2106 07/21/19 0100  Weight: 105 kg 105 kg    Examination:  General exam: Appears fused Respiratory system: Clear to auscultation. Respiratory effort normal. Cardiovascular system: S1 & S2 heard, RRR. No JVD, murmurs, rubs, gallops or clicks. No pedal edema. Gastrointestinal system: Abdomen is nondistended, soft and nontender. No organomegaly or masses felt. Normal bowel sounds heard. Central nervous system: Awake Extremities: No significant edema Skin: No rashes, lesions or ulcers Psychiatry: Difficult to assess    Data Reviewed: I have personally reviewed following labs and imaging studies  CBC: Recent Labs  Lab 07/20/19 1353 07/21/19 0557 07/22/19 1038  WBC 13.4* 9.8 9.8  NEUTROABS 8.3*  --   --   HGB 10.5* 9.3* 9.8*  HCT 33.7* 30.1* 31.9*  MCV 88.9 90.7 87.9  PLT 191 173 [id number]   Basic Metabolic Panel: Recent Labs  Lab 07/20/19 1353 07/20/19 1631 07/21/19 0557 07/22/19 1038 07/24/19 0559  NA 140  --  142 144 144  K 6.0* 5.7* 4.6 4.5 4.2  CL 104  --  106 109 109  CO2 26  --  28 28 24   GLUCOSE 192*  --  106* 163* 194*  BUN 63*  --  61* 51* 39*  CREATININE 3.26*  --  3.38* 2.91* 2.40*  CALCIUM 8.2*  --  8.1* 8.2* 8.7*   GFR: Estimated Creatinine Clearance: 28 mL/min (A) (by C-G formula based on SCr of 2.4  mg/dL (H)). Liver Function Tests: Recent Labs  Lab 07/20/19 1353  AST 20  ALT 19  ALKPHOS 82  BILITOT 0.6  PROT 6.4*  ALBUMIN 3.3*   No results for input(s): LIPASE, AMYLASE in the last 168 hours. Recent Labs  Lab 07/21/19 1351  AMMONIA 31   Coagulation Profile: No results for input(s): INR, PROTIME in the last 168 hours. Cardiac Enzymes: Recent Labs  Lab  07/20/19 1353  CKTOTAL 271*   BNP (last 3 results) No results for input(s): PROBNP in the last 8760 hours. HbA1C: No results for input(s): HGBA1C in the last 72 hours. CBG: Recent Labs  Lab 07/22/19 1116 07/22/19 1601 07/22/19 2141 07/23/19 1117 07/24/19 0809  GLUCAP 138* 202* 109* 178* 197*   Lipid Profile: No results for input(s): CHOL, HDL, LDLCALC, TRIG, CHOLHDL, LDLDIRECT in the last 72 hours. Thyroid Function Tests: No results for input(s): TSH, T4TOTAL, FREET4, T3FREE, THYROIDAB in the last 72 hours. Anemia Panel: Recent Labs    07/22/19 1038  FERRITIN 96  TIBC 234*  IRON 24*   Sepsis Labs: No results for input(s): PROCALCITON, LATICACIDVEN in the last 168 hours.  Recent Results (from the past 240 hour(s))  SARS Coronavirus 2 [facility] order, Performed in [facility] hospital lab) Nasopharyngeal Nasopharyngeal Swab     Status: None   Collection Time: 07/20/19  5:53 PM   Specimen: Nasopharyngeal Swab  Result Value Ref Range Status   SARS Coronavirus 2 NEGATIVE NEGATIVE Final    Comment: (NOTE) If result is NEGATIVE SARS-CoV-2 target nucleic acids are NOT DETECTED. The SARS-CoV-2 RNA is generally detectable in upper and lower  respiratory specimens during the acute phase of infection. The lowest  concentration of SARS-CoV-2 viral copies this assay can detect is 250  copies / mL. A negative result does not preclude SARS-CoV-2 infection  and should not be used as the sole basis for treatment or other  patient management decisions.  A negative result may occur with  improper specimen collection / handling, submission of specimen other  than nasopharyngeal swab, presence of viral mutation(s) within the  areas targeted by this assay, and inadequate number of viral copies  (<250 copies / mL). A negative result must be combined with clinical  observations, patient history, and epidemiological information. If result is POSITIVE SARS-CoV-2 target nucleic acids are  DETECTED. The SARS-CoV-2 RNA is generally detectable in upper and lower  respiratory specimens dur ing the acute phase of infection.  Positive  results are indicative of active infection with SARS-CoV-2.  Clinical  correlation with patient history and other diagnostic information is  necessary to determine patient infection status.  Positive results do  not rule out bacterial infection or co-infection with other viruses. If result is PRESUMPTIVE POSTIVE SARS-CoV-2 nucleic acids MAY BE PRESENT.   A presumptive positive result was obtained on the submitted specimen  and confirmed on repeat testing.  While 2019 novel coronavirus  (SARS-CoV-2) nucleic acids may be present in the submitted sample  additional confirmatory testing may be necessary for epidemiological  and / or clinical management purposes  to differentiate between  SARS-CoV-2 and other Sarbecovirus currently known to infect humans.  If clinically indicated additional testing with an alternate test  methodology [phone]) is advised. The SARS-CoV-2 RNA is generally  detectable in upper and lower respiratory sp ecimens during the acute  phase of infection. The expected result is Negative. Fact Sheet for Patients:  StrictlyIdeas.no Fact Sheet for Healthcare Providers: BankingDealers.co.za This test is not yet approved  or cleared by the [place] and has been authorized for detection and/or diagnosis of SARS-CoV-2 by FDA under an Emergency Use Authorization (EUA).  This EUA will remain in effect (meaning this test can be used) for the duration of the COVID-19 declaration under Section 564(b)(1) of the Act, 21 U.S.C. section 360bbb-3(b)(1), unless the authorization is terminated or revoked sooner. Performed at [facility], [address], [place], [place] [address]          Radiology Studies: US Renal  Result Date: 07/23/2019 CLINICAL DATA:  Chronic kidney disease.  Elevated creatinine for 2 days. EXAM: RENAL / URINARY TRACT ULTRASOUND COMPLETE COMPARISON:  None. FINDINGS: Right Kidney: Renal measurements: 10.9 x 5.2 x 6.0 cm = volume: 178 mL . Echogenicity within normal limits. No mass or hydronephrosis visualized. Left Kidney: Renal measurements: 12.3 x 6.5 x 5.9 cm. = volume: 244 mL. Echogenicity within normal limits. No mass or hydronephrosis visualized. Bladder: Appears normal for degree of bladder distention. Ureteral jets not visualized. IMPRESSION: 1. No mass or hydronephrosis identified. Electronically Signed   By: Kerby Moors M.D.   On: 07/23/2019 10:53        Scheduled Meds: . aspirin EC  81 mg Oral q morning - 10a  . atorvastatin  40 mg Oral QHS  . cloNIDine  0.2 mg Transdermal Q Sun  . clopidogrel  75 mg Oral q morning - 10a  . doxycycline  100 mg Oral q morning - 10a  . enoxaparin (LOVENOX) injection  30 mg Subcutaneous Q24H  . insulin aspart  0-15 Units Subcutaneous TID WC  . insulin glargine  10 Units Subcutaneous QHS  . isosorbide mononitrate  30 mg Oral q morning - 10a  . linagliptin  5 mg Oral q morning - 10a  . loratadine  10 mg Oral q morning - 10a  . metoprolol  200 mg Oral q morning - 10a  . pantoprazole  40 mg Oral Daily   Continuous Infusions:   LOS: 3 days    Time spent: 30 minutes    Tiki Tucciarone Darleen Crocker, DO [facility] Pager [phone]  If 7PM-7AM, please contact night-coverage [email or web address] Password [email or web address] 07/24/2019, 3:00 PM

## 2019-07-24 NOTE — Plan of Care (Signed)

## 2019-07-24 NOTE — Progress Notes (Signed)
Patient ID: Victoria Winters, female   DOB: 02/04/1951, 68 y.o.   MRN: [id number] [facility] Progress Note   Assessment/ Plan:   1. Acute kidney Injury on chronic kidney disease stage IV: Suspect underlying chronic kidney disease from hypertension/diabetes with recent acute kidney injury possibly hemodynamically mediated.  She is nonoliguric and has improving BUN/creatinine with supportive management.  Renal ultrasound did not show any hydronephrosis and surprisingly shows normal echogenicity.  Urinalysis unremarkable and awaiting 24-hour urine for creatinine clearance.  Whereas I believe she would benefit from outpatient nephrology follow-up after her discharge, I am not sure she would fare well with chronic hemodialysis going by her present mental status and refusal of therapies.  Conservative management including palliative care of end-stage renal disease may be better suited for her and this discussion should be continued with her (if mental status allows) or her sister-HC POA. 2.  Altered mental status: With delirium of unclear etiology in a patient with prior history of CVA.  No indication that this is uremic encephalopathy.  Additional evaluation/management per primary service and possibly will need skilled nursing facility upon discharge. 3.  Anemia of chronic disease: Without overt losses, will check iron studies (patient permitting) and decide on replacement versus ESA. 4.  Hypertension: On oral antihypertensive therapy but with challenges posed by patient's refusal of medications.  Subjective:   Overnight, noted to be refusing medications/lab draws.  No verbal response this morning.   Objective:   BP (!) 160/81 (BP Location: Right Arm)   Pulse 82   Temp 98.9 F (37.2 C) (Oral)   Resp 20   Ht 5\' 7"  (1.702 m)   Wt 105 kg   SpO2 97%   BMI 36.26 kg/m   Intake/Output Summary (Last 24 hours) at 07/24/2019 0802 Last data filed at 07/24/2019 0500 Gross per 24 hour  Intake 180 ml   Output 2400 ml  Net -2220 ml   Weight change:   Physical Exam: Gen: Resting comfortably in bed, no verbal responses. CVS: Pulse regular rhythm, normal rate, S1 and S2 normal Resp: Clear to auscultation, no distinct rales or rhonchi Abd: Soft, obese, nontender Ext: Trace left ankle edema, no right lower extremity edema.  Imaging: US Renal  Result Date: 07/23/2019 CLINICAL DATA:  Chronic kidney disease. Elevated creatinine for 2 days. EXAM: RENAL / URINARY TRACT ULTRASOUND COMPLETE COMPARISON:  None. FINDINGS: Right Kidney: Renal measurements: 10.9 x 5.2 x 6.0 cm = volume: 178 mL . Echogenicity within normal limits. No mass or hydronephrosis visualized. Left Kidney: Renal measurements: 12.3 x 6.5 x 5.9 cm. = volume: 244 mL. Echogenicity within normal limits. No mass or hydronephrosis visualized. Bladder: Appears normal for degree of bladder distention. Ureteral jets not visualized. IMPRESSION: 1. No mass or hydronephrosis identified. Electronically Signed   By: Kerby Moors M.D.   On: 07/23/2019 10:53    Labs: BMET Recent Labs  Lab 07/20/19 1353 07/20/19 1631 07/21/19 0557 07/22/19 1038 07/24/19 0559  NA 140  --  142 144 144  K 6.0* 5.7* 4.6 4.5 4.2  CL 104  --  106 109 109  CO2 26  --  28 28 24   GLUCOSE 192*  --  106* 163* 194*  BUN 63*  --  61* 51* 39*  CREATININE 3.26*  --  3.38* 2.91* 2.40*  CALCIUM 8.2*  --  8.1* 8.2* 8.7*   CBC Recent Labs  Lab 07/20/19 1353 07/21/19 0557 07/22/19 1038  WBC 13.4* 9.8 9.8  NEUTROABS 8.3*  --   --  HGB 10.5* 9.3* 9.8*  HCT 33.7* 30.1* 31.9*  MCV 88.9 90.7 87.9  PLT 191 173 167    Medications:    . aspirin EC  81 mg Oral q morning - 10a  . atorvastatin  40 mg Oral QHS  . cloNIDine  0.2 mg Transdermal Q Sun  . clopidogrel  75 mg Oral q morning - 10a  . doxycycline  100 mg Oral q morning - 10a  . enoxaparin (LOVENOX) injection  30 mg Subcutaneous Q24H  . insulin aspart  0-15 Units Subcutaneous TID WC  . insulin glargine   10 Units Subcutaneous QHS  . isosorbide mononitrate  30 mg Oral q morning - 10a  . linagliptin  5 mg Oral q morning - 10a  . loratadine  10 mg Oral q morning - 10a  . metoprolol  200 mg Oral q morning - 10a  . pantoprazole  40 mg Oral Daily    Elmarie Shiley, MD 07/24/2019, 8:02 AM

## 2019-07-24 NOTE — Progress Notes (Signed)
Physical Therapy Treatment Patient Details Name: Victoria Winters MRN: [id number] DOB: [birth year] Today's Date: 07/24/2019    History of Present Illness Patient is a 68 year old female admitted after a fall at home. Was found to have a nondisplaced left ankle fracture of the dorsal talus, proximal left fibular fracture and moderate diffuse encephalopathy with altered mental status.  PMH: CKD 4-5, brain aneurysm, DM, CVA, hallucinations, short term memory loss.    PT Comments    Patient required less assistance for sitting up at bedside and demonstrated good trunk control without loss of sitting balance while seated at bedside with feet unsupported.  Patient required much verbal/tactile cueing and demonstration to complete BLE exercises mostly due to not wanting to participate.  Patient apprehensive to attempt sit to stands or transfer to chair even after encouragement and requested to stay sitting up at bedside after therapy.  Patient informed she is not safe to sit up at bedside by herself due to fall risk and required 2 person assist to put back in bed.  Patient will benefit from continued physical therapy in hospital and recommended venue below to increase strength, balance, endurance for safe ADLs and gait.   Follow Up Recommendations  SNF;Supervision/Assistance - 24 hour     Equipment Recommendations  None recommended by PT    Recommendations for Other Services       Precautions / Restrictions Precautions Precautions: Fall Restrictions Weight Bearing Restrictions: Yes LLE Weight Bearing: Weight bearing as tolerated    Mobility  Bed Mobility Overal bed mobility: Needs Assistance Bed Mobility: Supine to Sit;Sit to Supine     Supine to sit: Min assist Sit to supine: Mod assist   General bed mobility comments: slow labored movement  Transfers                    Ambulation/Gait                 Stairs             Wheelchair Mobility    Modified  Rankin (Stroke Patients Only)       Balance Overall balance assessment: Needs assistance Sitting-balance support: Feet unsupported;No upper extremity supported Sitting balance-Leahy Scale: Fair Sitting balance - Comments: fair/good seated at bedside       Standing balance comment: patient refused to stand                            Cognition Arousal/Alertness: Awake/alert Behavior During Therapy: Flat affect Overall Cognitive Status: No family/caregiver present to determine baseline cognitive functioning                                 General Comments: Patient very apprehensive      Exercises General Exercises - Lower Extremity Ankle Circles/Pumps: Seated;AROM;Strengthening;Both;15 reps Long Arc Quad: Seated;AROM;AAROM;Strengthening;Both;10 reps    General Comments        Pertinent Vitals/Pain Pain Assessment: Faces Faces Pain Scale: Hurts even more Pain Location: left ankle with pressure/weightbearing Pain Descriptors / Indicators: Grimacing;Guarding Pain Intervention(s): Limited activity within patient's tolerance;Monitored during session    Home Living                      Prior Function            PT Goals (current goals can now be found in the care plan  section) Acute Rehab PT Goals Patient Stated Goal: less pain, get more mobile before going home, help with proper ramp in garage. PT Goal Formulation: With patient Time For Goal Achievement: 08/05/19 Progress towards PT goals: Progressing toward goals    Frequency    Min 3X/week      PT Plan Current plan remains appropriate    Co-evaluation              AM-PAC PT "6 Clicks" Mobility   Outcome Measure  Help needed turning from your back to your side while in a flat bed without using bedrails?: A Lot Help needed moving from lying on your back to sitting on the side of a flat bed without using bedrails?: A Lot Help needed moving to and from a bed to a  chair (including a wheelchair)?: Total Help needed standing up from a chair using your arms (e.g., wheelchair or bedside chair)?: Total Help needed to walk in hospital room?: Total Help needed climbing 3-5 steps with a railing? : Total 6 Click Score: 8    End of Session   Activity Tolerance: Patient tolerated treatment well;Patient limited by pain;Patient limited by fatigue Patient left: in bed;with call bell/phone within reach Nurse Communication: Mobility status PT Visit Diagnosis: Unsteadiness on feet (R26.81);Other abnormalities of gait and mobility (R26.89);Muscle weakness (generalized) (M62.81)     Time: [id number] PT Time Calculation (min) (ACUTE ONLY): 31 min  Charges:  $Therapeutic Exercise: 8-22 mins $Therapeutic Activity: 8-22 mins                     11:32 AM, 07/24/19 Lonell Grandchild, MPT Physical Therapist with [facility] [phone] office [phone] mobile phone

## 2019-07-24 NOTE — Consult Note (Signed)
Victoria A. Merlene Laughter, MD     [email or web address]          Victoria Winters is an 68 y.o. female.   ASSESSMENT/PLAN: 1.  Acute encephalopathy: The clinical picture and work-up is most consistent with the multifactorial toxic metabolic encephalopathy.  The patient should improve which she apparently is doing.  There appears to be some mild cognitive impairment at baseline.  Additional dementia labs will be obtained.  2.  Acute gait impairment due to #1.  3.  Significant apathy/abulia suggestive of significant depression or developing underlying dementia.  4.  Multiple bilateral remote lacunar infarcts.  This increases the long-term risk for cognitive impairment and vascular dementia.  A single antiplatelet agent is recommended.  I recommend we increase aspirin to 325 mg and discontinue the Plavix.  Patient is 68 year old white female who presented to the hospital with altered mental status and multiple metabolic derangements.  She has been worked up with imaging and EEG in addition which were essentially unrevealing.  She does have baseline history of hallucination and memory impairment.  It appears the patient has improved but still has significant cognitive impairment and hence a neurological consultation.  She has limited insight but does not have complaints at this time.  The review of systems is quite limited given the encephalopathy.    GENERAL: This is a pleasant obese female who is doing fair.  She apparently does completed her dinner.  HEENT: No trauma appreciated neck is supple.  ABDOMEN: soft  EXTREMITIES: No edema   BACK: Normal  SKIN: Normal by inspection.    MENTAL STATUS: She is awake and alert.  She is not oriented to time or place.  She thinks that she is in [facility].  She is unable to state why she is in the hospital.  She cannot provide any medical history.  She does have significant restricted affect and significant apathy.   CRANIAL NERVES: Pupils are equal, round and reactive to light; extra ocular movements are full, there is no significant nystagmus; visual fields are full; upper and lower facial muscles are normal in strength and symmetric, there is no flattening of the nasolabial folds; tongue is midline; uvula is midline; shoulder elevation is normal.  MOTOR: Normal tone, bulk and strength; no pronator drift.  COORDINATION: Left finger to nose is normal, right finger to nose is normal, No rest tremor; no intention tremor; no postural tremor; there is mild to moderate global psychomotor slowing and bradykinesia.  REFLEXES: Deep tendon reflexes are symmetrical and normal.   SENSATION: Normal to light pain.       Blood pressure (!) 160/81, pulse 82, temperature 98.9 F (37.2 C), temperature source Oral, resp. rate 20, height 5\' 7"  (1.702 m), weight 105 kg, SpO2 97 %.  Past Medical History:  Diagnosis Date  . Brain aneurysm   . Diabetes mellitus without complication ([place])   . Hallucinations    visual and auditory  . Memory loss   . Recurrent UTI   . Renal disorder   . Stroke [facility])     Past Surgical History:  Procedure Laterality Date  . CHOLECYSTECTOMY    . SKIN GRAFT      Family History  Problem Relation Age of Onset  . Heart disease Mother   . Diabetes Mellitus II Mother   . Heart disease Father   . Diabetes Father   . Heart disease Brother   . Cancer Other     Social History:  reports  that she has quit smoking. She has never used smokeless tobacco. She reports current alcohol use. She reports that she does not use drugs.  Allergies:  Allergies  Allergen Reactions  . Penicillins Anaphylaxis    Did it involve swelling of the face/tongue/throat, SOB, or low BP? Yes Did it involve sudden or severe rash/hives, skin peeling, or any reaction on the inside of your mouth or nose? Unknown Did you need to seek medical attention at a hospital or doctor's office? Unknown When did it last  happen?Within past 10 years If all above answers are "NO", may proceed with cephalosporin use.   . Chicken Allergy   . Eggs Or Egg-Derived Products Swelling  . Milk-Related Compounds   . Other     Black beans, cats, dogs, grass clippings  . Wheat Bran     Medications: Prior to Admission medications   Medication Sig Start Date End Date Taking? Authorizing Provider  acetaminophen-codeine (TYLENOL #4) 300-60 MG tablet Take 1 tablet by mouth every 4 (four) hours as needed for moderate pain.   Yes [provider]  aspirin EC 81 MG tablet Take 81 mg by mouth every morning.   Yes [provider]  atorvastatin (LIPITOR) 40 MG tablet Take 40 mg by mouth at bedtime.   Yes [provider]  cloNIDine (CATAPRES - DOSED IN MG/24 HR) 0.2 mg/24hr patch Place 0.2 mg onto the skin every Sunday.   Yes [provider]  clopidogrel (PLAVIX) 75 MG tablet Take 75 mg by mouth every morning.   Yes [provider]  Cranberry-Vitamin C-Vitamin E (CRANBERRY PLUS VITAMIN C) 4200-20-3 MG-MG-UNIT CAPS Take 1 capsule by mouth daily as needed (urinary tract health).   Yes [provider]  doxycycline (VIBRA-TABS) 100 MG tablet Take 100 mg by mouth every morning.   Yes [provider]  escitalopram (LEXAPRO) 10 MG tablet Take 10 mg by mouth every morning.   Yes [provider]  furosemide (LASIX) 40 MG tablet Take 40 mg by mouth every morning.   Yes [provider]  hydrALAZINE (APRESOLINE) 25 MG tablet Take 25 mg by mouth 3 (three) times daily.   Yes [provider]  insulin glargine (LANTUS) 100 UNIT/ML injection Inject 30 Units into the skin at bedtime.   Yes [provider]  insulin lispro (HUMALOG) 100 UNIT/ML injection Inject 1-17 Units into the skin 3 (three) times daily before meals. Sliding scale starting at; 180-220= 4 units 221-260= 6 units 261-300= 8 units 301-340=10units 341-380=12units   Yes [provider]  isosorbide mononitrate (IMDUR) 30 MG 24 hr tablet Take 30 mg by mouth every morning.   Yes [provider]  linagliptin (TRADJENTA) 5 MG TABS tablet Take 5 mg by mouth every morning.   Yes [provider]  loratadine (CLARITIN) 10 MG tablet Take 10 mg by mouth every morning.   Yes [provider]  Melatonin 1 MG TABS Take 1 mg by mouth at bedtime.   Yes [provider]  metoprolol (TOPROL-XL) 200 MG 24 hr tablet Take 200 mg by mouth every morning.   Yes [provider]  Multiple Vitamin (MULTIVITAMIN WITH MINERALS) TABS tablet Take 1 tablet by mouth daily.   Yes [provider]  omeprazole (PRILOSEC) 20 MG capsule Take 20 mg by mouth every morning.   Yes [provider]  pregabalin (LYRICA) 75 MG capsule Take 75 mg by mouth 2 (two) times daily.   Yes [provider]  Scheduled Meds: . aspirin EC  81 mg Oral q morning - 10a  . atorvastatin  40 mg Oral QHS  . cloNIDine  0.2 mg Transdermal Q Sun  . clopidogrel  75 mg Oral q morning - 10a  . doxycycline  100 mg Oral q morning - 10a  . enoxaparin (LOVENOX) injection  30 mg Subcutaneous Q24H  . insulin aspart  0-15 Units Subcutaneous TID WC  . insulin glargine  10 Units Subcutaneous QHS  . isosorbide mononitrate  30 mg Oral q morning - 10a  . linagliptin  5 mg Oral q morning - 10a  . loratadine  10 mg Oral q morning - 10a  . metoprolol  200 mg Oral q morning - 10a  . pantoprazole  40 mg Oral Daily   Continuous Infusions: PRN Meds:.acetaminophen **OR** acetaminophen, nystatin, ondansetron **OR** ondansetron (ZOFRAN) IV, polyethylene glycol     Results for orders placed or performed during the hospital encounter of 07/20/19 (from the past 48 hour(s))  Glucose, capillary     Status: Abnormal   Collection Time: 07/22/19  9:41 PM  Result Value Ref Range   Glucose-Capillary 109 (H) 70 - 99 mg/dL   Comment 1 Notify RN    Comment 2 Document in Chart    Glucose, capillary     Status: Abnormal   Collection Time: 07/23/19 11:17 AM  Result Value Ref Range   Glucose-Capillary 178 (H) 70 - 99 mg/dL  Basic metabolic panel     Status: Abnormal   Collection Time: 07/24/19  5:59 AM  Result Value Ref Range   Sodium 144 135 - 145 mmol/L   Potassium 4.2 3.5 - 5.1 mmol/L   Chloride 109 98 - 111 mmol/L   CO2 24 22 - 32 mmol/L   Glucose, Bld 194 (H) 70 - 99 mg/dL   BUN 39 (H) 8 - 23 mg/dL   Creatinine, Ser 2.40 (H) 0.44 - 1.00 mg/dL   Calcium 8.7 (L) 8.9 - 10.3 mg/dL   GFR calc non Af Amer 20 (L) >60 mL/min   GFR calc Af Amer 23 (L) >60 mL/min   Anion gap 11 5 - 15    Comment: Performed at [facility], [address], [place], [place] [address]  Glucose, capillary     Status: Abnormal   Collection Time: 07/24/19  8:09 AM  Result Value Ref Range   Glucose-Capillary 197 (H) 70 - 99 mg/dL    Studies/Results:    BRAIN MRI FINDINGS: Brain: Diffusion imaging does not show any acute or subacute infarction. There are old ischemic changes of the pons. No cerebellar abnormality. Cerebral hemispheres show old infarction in the left basal ganglia and internal capsule and in the right basal ganglia external capsule. Chronic small-vessel changes affect the thalami in the hemispheric white matter. No large vessel territory infarction. Hemosiderin deposition associated with the old basal ganglia infarctions. No sign of recent hemorrhage, hydrocephalus or extra-axial collection.  Vascular: Major vessels at the base of the brain show flow.  Skull and upper cervical spine: Negative  Sinuses/Orbits: Inflammatory changes of the right frontal and ethmoid sinuses. Other sinuses clear. Orbits negative.  Other: None  IMPRESSION: No acute or reversible finding. Old ischemic changes affecting the pons. Old infarctions in the basal ganglia and radiating white matter tracts, with hemosiderin deposition.  Brain MRI is reviewed in person.  No acute  changes are noted on DWI.  No hemorrhages appreciated.  There is mild the deep white matter leukoencephalopathy appropriate for age.  There are areas of small encephalomalacia involving the right pontine region, right centrum semiovale and the basal ganglia bilaterally.  These are most consistent with acute infarcts.   EEG DESCRIPTION: EEG showed continuous generalized 3-6hz  theta delta slowing as well as 1-1.5Hz  triphasic waves, max bifrontal. Hyperventilation and photic stimulation were not performed.  IMPRESSION: This study is suggestive of moderate diffuse encephalopathy likely secondary to metabolic etiology. No seizures or clear epileptiform discharges were seen throughout the recording.   Rahman Ferrall A. Merlene Winters, M.D.  Diplomate, Tax adviser of Psychiatry and Neurology ( Neurology). 07/24/2019, 7:11 PM

## 2019-07-24 NOTE — Progress Notes (Addendum)
Palliative Medicine Team  Due to high volume of referrals, there is a delay seeing this patient. PMT provider will discuss [place] with family tomorrow, 8/18. Thank you.  NO CHARGE  Ihor Dow, [place], FNP-C Palliative Medicine Team  Phone: [phone] Fax: [phone]

## 2019-07-25 DIAGNOSIS — Z8673 Personal history of transient ischemic attack (TIA), and cerebral infarction without residual deficits: Secondary | ICD-10-CM

## 2019-07-25 DIAGNOSIS — I671 Cerebral aneurysm, nonruptured: Secondary | ICD-10-CM

## 2019-07-25 DIAGNOSIS — R443 Hallucinations, unspecified: Secondary | ICD-10-CM

## 2019-07-25 DIAGNOSIS — Z515 Encounter for palliative care: Secondary | ICD-10-CM

## 2019-07-25 DIAGNOSIS — Z7189 Other specified counseling: Secondary | ICD-10-CM

## 2019-07-25 LAB — BASIC METABOLIC PANEL
Anion gap: 11 (ref 5–15)
BUN: 43 mg/dL — ABNORMAL HIGH (ref 8–23)
CO2: 23 mmol/L (ref 22–32)
Calcium: 8.8 mg/dL — ABNORMAL LOW (ref 8.9–10.3)
Chloride: 112 mmol/L — ABNORMAL HIGH (ref 98–111)
Creatinine, Ser: 2.23 mg/dL — ABNORMAL HIGH (ref 0.44–1.00)
GFR calc Af Amer: 25 mL/min — ABNORMAL LOW (ref >60)
GFR calc non Af Amer: 22 mL/min — ABNORMAL LOW (ref >60)
Glucose, Bld: 237 mg/dL — ABNORMAL HIGH (ref 70–99)
Potassium: 3.9 mmol/L (ref 3.5–5.1)
Sodium: 146 mmol/L — ABNORMAL HIGH (ref 135–145)

## 2019-07-25 LAB — CBC
HCT: 35.2 % — ABNORMAL LOW (ref 36.0–46.0)
Hemoglobin: 11 g/dL — ABNORMAL LOW (ref 12.0–15.0)
MCH: 27.2 pg (ref 26.0–34.0)
MCHC: 31.3 g/dL (ref 30.0–36.0)
MCV: 87.1 fL (ref 80.0–100.0)
Platelets: 225 10*3/uL (ref 150–400)
RBC: 4.04 MIL/uL (ref 3.87–5.11)
RDW: 13.4 % (ref 11.5–15.5)
WBC: 13.5 10*3/uL — ABNORMAL HIGH (ref 4.0–10.5)
nRBC: 0 % (ref 0.0–0.2)

## 2019-07-25 LAB — GLUCOSE, CAPILLARY
Glucose-Capillary: 228 mg/dL — ABNORMAL HIGH (ref 70–99)
Glucose-Capillary: 301 mg/dL — ABNORMAL HIGH (ref 70–99)
Glucose-Capillary: 230 mg/dL — ABNORMAL HIGH (ref 70–99)
Glucose-Capillary: 275 mg/dL — ABNORMAL HIGH (ref 70–99)

## 2019-07-25 MED ORDER — HYDRALAZINE HCL 20 MG/ML IJ SOLN
10.0000 mg | INTRAMUSCULAR | Status: DC | PRN
  Administered 2019-07-25 – 2019-07-26 (×3): 10 mg via INTRAVENOUS
  Filled 2019-07-25 (×3): qty 1

## 2019-07-25 MED ORDER — ASPIRIN EC 325 MG PO TBEC
325.0000 mg | DELAYED_RELEASE_TABLET | Freq: Every morning | ORAL | Status: DC
  Administered 2019-07-26 – 2019-07-28 (×3): 325 mg via ORAL
  Filled 2019-07-25 (×4): qty 1

## 2019-07-25 MED ORDER — QUETIAPINE FUMARATE 25 MG PO TABS
25.0000 mg | ORAL_TABLET | Freq: Every day | ORAL | Status: DC
  Administered 2019-07-26 – 2019-07-28 (×3): 25 mg via ORAL
  Filled 2019-07-25 (×4): qty 1

## 2019-07-25 NOTE — Progress Notes (Signed)
VM left for sister, [name] to arrange time to discuss [place].   NO CHARGE  Ihor Dow, FNP-C Palliative Medicine Team  Phone: [phone] Fax: [phone]

## 2019-07-25 NOTE — Progress Notes (Signed)
Refused lab draws, lab tech instructed to come back at a later time. MD in room. Patient became nauseated and vomited approx. 144mL bile-colored vomitus. Also voided. Patient cleaned and pericare provided. Remains in chair. Will continue to monitor.

## 2019-07-25 NOTE — Progress Notes (Signed)
Patient ID: Victoria Winters, female   DOB: [birth year], 68 y.o.   MRN: [id number] S: Pt seen soon after she had an episode of emesis and then urinary incontinence. She was refusing any blood draws  O:BP (!) 158/92 (BP Location: Right Arm)   Pulse 80   Temp 98.8 F (37.1 C) (Oral)   Resp 20   Ht 5\' 7"  (1.702 m)   Wt 105 kg   SpO2 100%   BMI 36.26 kg/m   Intake/Output Summary (Last 24 hours) at 07/25/2019 1048 Last data filed at 07/25/2019 [id number] Gross per 24 hour  Intake 480 ml  Output 1700 ml  Net -1220 ml   Intake/Output: I/O last 3 completed shifts: In: 480 [P.O.:480] Out: 2400 [Urine:2400]  Intake/Output this shift:  No intake/output data recorded. Weight change:  Gen: elderly WF in mild distress CVS: no rub Resp: cta Abd: benign Ext: trace ankle edema  Recent Labs  Lab 07/20/19 1353 07/20/19 1631 07/21/19 0557 07/22/19 1038 07/24/19 0559 07/25/19 0542  NA 140  --  142 144 144 146*  K 6.0* 5.7* 4.6 4.5 4.2 3.9  CL 104  --  106 109 109 112*  CO2 26  --  28 28 24 23   GLUCOSE 192*  --  106* 163* 194* 237*  BUN 63*  --  61* 51* 39* 43*  CREATININE 3.26*  --  3.38* 2.91* 2.40* 2.23*  ALBUMIN 3.3*  --   --   --   --   --   CALCIUM 8.2*  --  8.1* 8.2* 8.7* 8.8*  AST 20  --   --   --   --   --   ALT 19  --   --   --   --   --    Liver Function Tests: Recent Labs  Lab 07/20/19 1353  AST 20  ALT 19  ALKPHOS 82  BILITOT 0.6  PROT 6.4*  ALBUMIN 3.3*   No results for input(s): LIPASE, AMYLASE in the last 168 hours. Recent Labs  Lab 07/21/19 1351  AMMONIA 31   CBC: Recent Labs  Lab 07/20/19 1353 07/21/19 0557 07/22/19 1038 07/25/19 0542  WBC 13.4* 9.8 9.8 13.5*  NEUTROABS 8.3*  --   --   --   HGB 10.5* 9.3* 9.8* 11.0*  HCT 33.7* 30.1* 31.9* 35.2*  MCV 88.9 90.7 87.9 87.1  PLT 191 173 167 225   Cardiac Enzymes: Recent Labs  Lab 07/20/19 1353  CKTOTAL 271*   CBG: Recent Labs  Lab 07/22/19 2141 07/23/19 1117 07/24/19 0809 07/24/19 2104  07/25/19 0757  GLUCAP 109* 178* 197* 204* 228*    Iron Studies: No results for input(s): IRON, TIBC, TRANSFERRIN, FERRITIN in the last 72 hours. Studies/Results: No results found. Derrill Memo ON 07/26/2019] aspirin EC  325 mg Oral q morning - 10a  . atorvastatin  40 mg Oral QHS  . cloNIDine  0.2 mg Transdermal Q Sun  . doxycycline  100 mg Oral q morning - 10a  . enoxaparin (LOVENOX) injection  30 mg Subcutaneous Q24H  . insulin aspart  0-15 Units Subcutaneous TID WC  . insulin glargine  10 Units Subcutaneous QHS  . isosorbide mononitrate  30 mg Oral q morning - 10a  . linagliptin  5 mg Oral q morning - 10a  . loratadine  10 mg Oral q morning - 10a  . metoprolol  200 mg Oral q morning - 10a  . pantoprazole  40 mg Oral Daily  BMET    Component Value Date/Time   NA 146 (H) 07/25/2019 0542   K 3.9 07/25/2019 0542   CL 112 (H) 07/25/2019 0542   CO2 23 07/25/2019 0542   GLUCOSE 237 (H) 07/25/2019 0542   BUN 43 (H) 07/25/2019 0542   CREATININE 2.23 (H) 07/25/2019 0542   CALCIUM 8.8 (L) 07/25/2019 0542   GFRNONAA 22 (L) 07/25/2019 0542   GFRAA 25 (L) 07/25/2019 0542   CBC    Component Value Date/Time   WBC 13.5 (H) 07/25/2019 0542   RBC 4.04 07/25/2019 0542   HGB 11.0 (L) 07/25/2019 0542   HCT 35.2 (L) 07/25/2019 0542   PLT 225 07/25/2019 0542   MCV 87.1 07/25/2019 0542   MCH 27.2 07/25/2019 0542   MCHC 31.3 07/25/2019 0542   RDW 13.4 07/25/2019 0542   LYMPHSABS 3.2 07/20/2019 1353   MONOABS 1.1 (H) 07/20/2019 1353   EOSABS 0.6 (H) 07/20/2019 1353   BASOSABS 0.1 07/20/2019 1353     Assessment/Plan:  1. AKI on presumed CKD of unclear stage (from DM/HTN) presumably from hemodynamic alterations which have improved since admission.  Awaiting 24 hour creatinine clearance.  Unknown baseline Cr as she has received care in [place] and will need to contact her daughter for more information.  She will require outpatient follow up with Nephrology if she agrees as she has been  refusing care during this hospitalization.  Renal US without hydro or masses.  Continue to follow UOP and Cr. 2. AMS- CT with old strokes, nothing acute, same with MRI.  Neurology following.  Negative EEG.  Further workup underway. 3. Anemia of CKD- no indication for ESA at this time 4. HTN- continue with meds 5. Disposition- difficult to assess her competence due to her encephalopathy and underlying cognitive impairment.  Agree with Palliative care consult to help set goals/limits of care.    Donetta Potts, MD [employer] [phone]

## 2019-07-25 NOTE — Progress Notes (Signed)
Patient c/o pain to bil legs. This nurse offered tylenol, patient agreeable. When tylenol opened and pill cup offered to patient, patient stated "I'm not taking it." This nurse explained to patient it may relieve pain in legs, patient stated "my legs don't hurt." Patient refused all PO meds, Lovenox and insulin. Refused breakfast. Bed alarm remains in place, will continue to monitor.

## 2019-07-25 NOTE — Progress Notes (Addendum)
Patient vomited again, bile-colored emesis. Patient offered prn zofran again and patient was agreeable. PRN zofran given. Remains in chair, offered food and drink, patient refused. Patient allowed staff to obtain VS. Refused lab draw, explained purpose for labs, patient continues to refuse.

## 2019-07-25 NOTE — Consult Note (Signed)
Consultation Note Date: 07/25/2019   Patient Name: Victoria Winters  DOB: 09-15-1951  MRN: [id number]  Age / Sex: 68 y.o., female  PCP: [facility] Referring Physician: Rodena Goldmann, DO  Reason for Consultation: Establishing goals of care  HPI/Patient Profile: 68 y.o. female  with past medical history of CVA with residual deficits, DM, brain aneurysms, hallucinations, frequent UTI's, CKD 4-5  admitted on 07/20/2019 with fall and altered mental status. Hospital admission for acute on chronic metabolic encephalopathy and associated fall resulting in proximal fibula fracture. Ortho recommending weightbearing as tolerated. PT recommending SNF for rehab. Nephrology following. Patient is a poor candidate for dialysis with encephalopathy and refusal of medications/labs. Neurology following for ongoing encephalopathy. CT/MRI/EEG negative for acute findings. Per neurology, acute encephalopathy multifactorial with baseline cognitive impairment, significant depression or developing dementia, and multiple bilateral remote lacunar infarcts. Palliative medicine consultation for goals of care.   Clinical Assessment and Goals of Care:  I have reviewed medical records including, discussed with Dr. Manuella Ghazi, and assessed patient. She wakes to voice but does not engage in conversation. She continues to refuse medications and lab work. Per sister, she has become very paranoid this admission. She does not appear to be in pain or discomfort.    Met with sister, Willeen Cass in family waiting area to discuss [place]. Victoria Winters is documented HCPOA/durable POA for [employer]. HCPOA copy has been placed in chart.   Introduced Palliative Medicine as specialized medical care for people living with serious illness. It focuses on providing relief from the symptoms and stress of a serious illness. The goal is to improve quality of life  for both the patient and the family.  We discussed a brief life review of the patient. [name] suffered a stroke in 2016, which has left her with short-term memory loss. After the stroke, living with daughter in [place]. Family moved her back to [facility] in Fall 2019 as Victoria Winters was willing to serve as primary caregiver.   Prior to admit, patient with baseline significant short-term memory loss (often does not remember what Victoria Winters tells her within 10 minutes). She requires assist and encouragement with ADL's. She intermittent will ambulate with walker but sometimes needs Victoria Winters to assist with wheelchair. Appetite was great prior to admit.  Victoria Winters confirms ongoing CKD and plan to f/u with nephrologist this month. Victoria Winters has suspected Sherece would have to start dialysis soon but was preparing to take her to all appointments and dialysis sessions.   Discussed events leading up to admission and course of hospitalization including diagnoses, interventions, and plan of care.   Victoria Winters reports that the paranoia is NEW since admission. Galit is not even trusting Victoria Winters and believes everyone is out to 'get her.' Reviewed recommendations from attending and neurology. Victoria Winters shares that Deborrah has taken Ativan and Xanax in the past, and this has helped her nerves. Explained recommendation to start Seroquel for hospital delirium and behavior disturbance. Victoria Winters agrees with this plan.   [name]'s ultimate goal is to bring Victoria Winters back  home but she cannot bring Victoria Winters back home until she is able to stand on her own and at least be able to take a few steps to wheelchair. Victoria Winters wishes to pursue SNF placement to attempt rehab. We discussed that delirium, paranoia, and behavioral disturbances may not improve in another environment that is unfamiliar to [place]. Explained challenges with getting approved for SNF with paranoia/refusal of interventions.   I attempted to elicit values and goals of care important to the  [place] for her sister. Advanced directives, concepts specific to code status, artifical feeding and hydration, and rehospitalization were considered and discussed. Discussed in detail and completed electronic MOST form with Victoria Winters. Understanding irreversible conditions, Victoria Winters does not wish to put Victoria Winters through heroic interventions if nearing EOL. "She has been through enough." Decision made for DNR/DNI, limited interventions including re-hospitalization, IVF/ABX if indicated, and short-term feeding tube if indicated. MOST form and Durable DNR copies made for chart and sister.   Victoria Winters and I did discuss poor candidacy for hemodialysis with progressive kidney disease and high risk for further kidney damage leading to failure. Victoria Winters does seem to understand that with ongoing paranoia and cognitive decline, it would not be medically indicated to force [place] to do dialysis.   Victoria Winters remains hopeful that Marcene will "snap out" of the paranoia, start eating/drinking for her, and work with therapy to at least be able to stand on her own and return home.    Questions and concerns were addressed. PMT contact information given.     SUMMARY OF RECOMMENDATIONS    GOC discussed with patient's sister/HCPOA, Willeen Cass. HCPOA paperwork copy in chart.   Electronic MOST form completed. DNR/DNI, limited interventions including re-hospitalization, IVF/ABX if indicated, and short-term feeding tube if indicated. Copies of MOST and Durable DNR made for sister and chart.   Attending to start Seroquel tonight.   Sister hopeful Kelee will "snap out" of the paranoia (which is new since admission), begin eating/drinking again, and work with therapy. Sister requesting SNF to attempt rehab. Sister cannot take Victoria Winters home until she can stand and take a few steps on her own. Sister hopeful for [name] to return home after SNF.   Sister does seem to understand that Victoria Winters is a poor candidate for hemodialysis with  underlying cognitive impairment and paranoia.   May benefit from outpatient palliative referral if SNF will accept her under rehab.   PMT will follow inpatient.   Code Status/Advance Care Planning:  DNR  Symptom Management:   Attending to initiate Seroquel tonight  Palliative Prophylaxis:   Aspiration, Bowel Regimen, Delirium Protocol, Frequent Pain Assessment, Oral Care and Turn Reposition  Psycho-social/Spiritual:   Desire for further Chaplaincy support:yes  Additional Recommendations: Caregiving  Support/Resources, Compassionate Wean Education and Education on Hospice  Prognosis:   Unable to determine: guarded to poor pending improvement of paranoia and behavioral disturbance.  Discharge Planning: To Be Determined      Primary Diagnoses: Present on Admission: . Fall . Acute encephalopathy   I have reviewed the medical record, interviewed the patient and family, and examined the patient. The following aspects are pertinent.  Past Medical History:  Diagnosis Date  . Brain aneurysm   . Diabetes mellitus without complication ([place])   . Hallucinations    visual and auditory  . Memory loss   . Recurrent UTI   . Renal disorder   . Stroke [facility])    Social History   Socioeconomic History  . Marital status: Single    Spouse  name: Not on file  . Number of children: Not on file  . Years of education: Not on file  . Highest education level: Not on file  Occupational History  . Not on file  Social Needs  . Financial resource strain: Not on file  . Food insecurity    Worry: Not on file    Inability: Not on file  . Transportation needs    Medical: Not on file    Non-medical: Not on file  Tobacco Use  . Smoking status: Former Research scientist (life sciences)  . Smokeless tobacco: Never Used  Substance and Sexual Activity  . Alcohol use: Yes    Frequency: Never    Comment: rarely  . Drug use: Never  . Sexual activity: Not on file  Lifestyle  . Physical activity    Days per week:  Not on file    Minutes per session: Not on file  . Stress: Not on file  Relationships  . Social Herbalist on phone: Not on file    Gets together: Not on file    Attends religious service: Not on file    Active member of club or organization: Not on file    Attends meetings of clubs or organizations: Not on file    Relationship status: Not on file  Other Topics Concern  . Not on file  Social History Narrative  . Not on file   Family History  Problem Relation Age of Onset  . Heart disease Mother   . Diabetes Mellitus II Mother   . Heart disease Father   . Diabetes Father   . Heart disease Brother   . Cancer Other    Scheduled Meds: . [START ON 07/26/2019] aspirin EC  325 mg Oral q morning - 10a  . atorvastatin  40 mg Oral QHS  . cloNIDine  0.2 mg Transdermal Q Sun  . doxycycline  100 mg Oral q morning - 10a  . enoxaparin (LOVENOX) injection  30 mg Subcutaneous Q24H  . insulin aspart  0-15 Units Subcutaneous TID WC  . insulin glargine  10 Units Subcutaneous QHS  . isosorbide mononitrate  30 mg Oral q morning - 10a  . linagliptin  5 mg Oral q morning - 10a  . loratadine  10 mg Oral q morning - 10a  . metoprolol  200 mg Oral q morning - 10a  . pantoprazole  40 mg Oral Daily  . QUEtiapine  25 mg Oral QHS   Continuous Infusions: PRN Meds:.acetaminophen **OR** acetaminophen, hydrALAZINE, nystatin, ondansetron **OR** ondansetron (ZOFRAN) IV, polyethylene glycol Medications Prior to Admission:  Prior to Admission medications   Medication Sig Start Date End Date Taking? Authorizing Provider  acetaminophen-codeine (TYLENOL #4) 300-60 MG tablet Take 1 tablet by mouth every 4 (four) hours as needed for moderate pain.   Yes [provider]  aspirin EC 81 MG tablet Take 81 mg by mouth every morning.   Yes [provider]  atorvastatin (LIPITOR) 40 MG tablet Take 40 mg by mouth at bedtime.   Yes [provider]  cloNIDine (CATAPRES - DOSED IN MG/24  HR) 0.2 mg/24hr patch Place 0.2 mg onto the skin every Sunday.   Yes [provider]  clopidogrel (PLAVIX) 75 MG tablet Take 75 mg by mouth every morning.   Yes [provider]  Cranberry-Vitamin C-Vitamin E (CRANBERRY PLUS VITAMIN C) 4200-20-3 MG-MG-UNIT CAPS Take 1 capsule by mouth daily as needed (urinary tract health).   Yes [provider]  doxycycline (VIBRA-TABS) 100 MG tablet Take 100 mg by mouth every morning.   Yes [provider]  escitalopram (LEXAPRO) 10 MG tablet Take 10 mg by mouth every morning.   Yes [provider]  furosemide (LASIX) 40 MG tablet Take 40 mg by mouth every morning.   Yes [provider]  hydrALAZINE (APRESOLINE) 25 MG tablet Take 25 mg by mouth 3 (three) times daily.   Yes [provider]  insulin glargine (LANTUS) 100 UNIT/ML injection Inject 30 Units into the skin at bedtime.   Yes [provider]  insulin lispro (HUMALOG) 100 UNIT/ML injection Inject 1-17 Units into the skin 3 (three) times daily before meals. Sliding scale starting at; 180-220= 4 units 221-260= 6 units 261-300= 8 units 301-340=10units 341-380=12units   Yes [provider]  isosorbide mononitrate (IMDUR) 30 MG 24 hr tablet Take 30 mg by mouth every morning.   Yes [provider]  linagliptin (TRADJENTA) 5 MG TABS tablet Take 5 mg by mouth every morning.   Yes [provider]  loratadine (CLARITIN) 10 MG tablet Take 10 mg by mouth every morning.   Yes [provider]  Melatonin 1 MG TABS Take 1 mg by mouth at bedtime.   Yes [provider]  metoprolol (TOPROL-XL) 200 MG 24 hr tablet Take 200 mg by mouth every morning.   Yes [provider]  Multiple Vitamin (MULTIVITAMIN WITH MINERALS) TABS tablet Take 1 tablet by mouth daily.   Yes [provider]  omeprazole (PRILOSEC) 20 MG capsule Take 20 mg by mouth every morning.   Yes [provider]   pregabalin (LYRICA) 75 MG capsule Take 75 mg by mouth 2 (two) times daily.   Yes [provider]   Allergies  Allergen Reactions  . Penicillins Anaphylaxis    Did it involve swelling of the face/tongue/throat, SOB, or low BP? Yes Did it involve sudden or severe rash/hives, skin peeling, or any reaction on the inside of your mouth or nose? Unknown Did you need to seek medical attention at a hospital or doctor's office? Unknown When did it last happen?Within past 10 years If all above answers are "NO", may proceed with cephalosporin use.   . Chicken Allergy   . Eggs Or Egg-Derived Products Swelling  . Milk-Related Compounds   . Other     Black beans, cats, dogs, grass clippings  . Wheat Bran    Review of Systems  Unable to perform ROS: Mental status change   Physical Exam Vitals signs and nursing note reviewed.  Constitutional:      Appearance: She is ill-appearing.  HENT:     Head: Normocephalic and atraumatic.  Pulmonary:     Effort: No tachypnea, accessory muscle usage or respiratory distress.  Skin:    General: Skin is warm and dry.  Neurological:     Mental Status: She is easily aroused.     Comments: Wakes to voice. Oriented to self. Cognitive delay and paranoia.   Psychiatric:        Mood and Affect: Affect is flat.        Speech: Speech is delayed.        Thought Content: Thought content is paranoid.    Vital Signs: BP (!) 194/82 (BP Location: Right Arm) Comment: MD aware  Pulse 81   Temp 98.4 F (36.9 C) (Oral)   Resp 16   Ht '5\' 7"'$  (1.702 m)   Wt 105 kg   SpO2 99%   BMI  36.26 kg/m  Pain Scale: Faces   Pain Score: 0-No pain   SpO2: SpO2: 99 % O2 Device:SpO2: 99 % O2 Flow Rate: .   IO: Intake/output summary:   Intake/Output Summary (Last 24 hours) at 07/25/2019 1548 Last data filed at 07/25/2019 1200 Gross per 24 hour  Intake 240 ml  Output 1700 ml  Net -1460 ml    LBM: Last BM Date: 07/25/19 Baseline Weight: Weight: 105 kg Most  recent weight: Weight: 105 kg     Palliative Assessment/Data: PPS 40%   Flowsheet Rows     Most Recent Value  Intake Tab  Referral Department  Hospitalist  Unit at Time of Referral  Med/Surg Unit  Palliative Care Primary Diagnosis  Neurology  Palliative Care Type  New Palliative care  Reason for referral  Clarify Goals of Care  Date first seen by Palliative Care  07/25/19  Clinical Assessment  Palliative Performance Scale Score  40%  Psychosocial & Spiritual Assessment  Palliative Care Outcomes  Patient/Family meeting held?  Yes  Who was at the meeting?  sister/HCPOA  Palliative Care Outcomes  Clarified goals of care, Counseled regarding hospice, Provided end of life care assistance, Provided advance care planning, Provided psychosocial or spiritual support, Changed CPR status, Completed durable DNR, ACP counseling assistance, Linked to palliative care logitudinal support      Time In: 1300 Time Out: 1430 Time Total: 90 Greater than 50%  of this time was spent counseling and coordinating care related to the above assessment and plan.  Signed by:  Ihor Dow, DNP, FNP-C Palliative Medicine Team  Phone: [phone] Fax: [phone]   Please contact Palliative Medicine Team phone at [phone] for questions and concerns.  For individual provider: See Shea Evans

## 2019-07-25 NOTE — Progress Notes (Signed)
Physical Therapy Treatment Patient Details Name: Victoria Winters MRN: [id number] DOB: 1951/06/16 Today's Date: 07/25/2019    History of Present Illness Patient is a 68 year old female admitted after a fall at home. Was found to have a nondisplaced left ankle fracture of the dorsal talus, proximal left fibular fracture and moderate diffuse encephalopathy with altered mental status.  PMH: CKD 4-5, brain aneurysm, DM, CVA, hallucinations, short term memory loss.    PT Comments    Patient more cooperative today and demonstrates increased endurance/distance for ambulation in room and hallway without loss of balance, able to transfer to commode in bathroom to have a bowel movement prior to gait training, and requires frequent verbal/tactile cueing for redirection when attempting functional tasks.   Patient easily agitated once fatigued and required much encouragement to return to her room after sitting in chair in hallway, had to roll patient in chair to put back in her room - RN notified.  Patient tolerated sitting up in chair with feet elevated after therapy - nursing staff notified.  Patient will benefit from continued physical therapy in hospital and recommended venue below to increase strength, balance, endurance for safe ADLs and gait.   Follow Up Recommendations  SNF;Supervision/Assistance - 24 hour;Supervision for mobility/OOB     Equipment Recommendations  None recommended by PT    Recommendations for Other Services       Precautions / Restrictions Precautions Precautions: Fall Restrictions Weight Bearing Restrictions: Yes LLE Weight Bearing: Weight bearing as tolerated    Mobility  Bed Mobility Overal bed mobility: Needs Assistance Bed Mobility: Supine to Sit       Sit to supine: Min assist   General bed mobility comments: slightly labored movement, increased time  Transfers Overall transfer level: Needs assistance Equipment used: Rolling walker (2 wheeled) Transfers: Sit  to/from [facility] Sit to Stand: Min assist Stand pivot transfers: Min assist       General transfer comment: requires frequent verbal/tactile cueing for redirection to tasks  Ambulation/Gait Ambulation/Gait assistance: Min assist Gait Distance (Feet): 40 Feet Assistive device: Rolling walker (2 wheeled) Gait Pattern/deviations: Decreased step length - right;Decreased step length - left;Decreased stride length Gait velocity: decreased   General Gait Details: slightly labored cadence limited mostly due to c/o fatigue and mild difficulty breathing when wearing mask   Stairs             Wheelchair Mobility    Modified Rankin (Stroke Patients Only)       Balance Overall balance assessment: Needs assistance Sitting-balance support: Feet supported;No upper extremity supported Sitting balance-Leahy Scale: Fair Sitting balance - Comments: fair/good seated at bedside   Standing balance support: During functional activity;Bilateral upper extremity supported Standing balance-Leahy Scale: Fair Standing balance comment: using RW                            Cognition Arousal/Alertness: Awake/alert Behavior During Therapy: Flat affect Overall Cognitive Status: No family/caregiver present to determine baseline cognitive functioning Area of Impairment: Problem solving                             Problem Solving: Slow processing General Comments: Patient very apprehensive      Exercises      General Comments        Pertinent Vitals/Pain Pain Assessment: Faces Faces Pain Scale: Hurts a little bit Pain Location: left ankle with pressure/weightbearing Pain  Descriptors / Indicators: Discomfort;Guarding Pain Intervention(s): Limited activity within patient's tolerance;Monitored during session    Home Living                      Prior Function            PT Goals (current goals can now be found in the care plan  section) Acute Rehab PT Goals Patient Stated Goal: less pain, get more mobile before going home, help with proper ramp in garage. PT Goal Formulation: With patient Time For Goal Achievement: 08/05/19 Progress towards PT goals: Progressing toward goals    Frequency    Min 3X/week      PT Plan Current plan remains appropriate    Co-evaluation              AM-PAC PT "6 Clicks" Mobility   Outcome Measure  Help needed turning from your back to your side while in a flat bed without using bedrails?: A Little Help needed moving from lying on your back to sitting on the side of a flat bed without using bedrails?: A Little Help needed moving to and from a bed to a chair (including a wheelchair)?: A Lot Help needed standing up from a chair using your arms (e.g., wheelchair or bedside chair)?: A Little Help needed to walk in hospital room?: A Lot Help needed climbing 3-5 steps with a railing? : A Lot 6 Click Score: 15    End of Session   Activity Tolerance: Patient tolerated treatment well;Patient limited by fatigue;Treatment limited secondary to agitation Patient left: in chair;with call bell/phone within reach Nurse Communication: Mobility status PT Visit Diagnosis: Unsteadiness on feet (R26.81);Other abnormalities of gait and mobility (R26.89);Muscle weakness (generalized) (M62.81)     Time: [id number] PT Time Calculation (min) (ACUTE ONLY): 40 min  Charges:  $Gait Training: 8-22 mins $Therapeutic Activity: 23-37 mins                     11:33 AM, 07/25/19 Lonell Grandchild, MPT Physical Therapist with [facility] [phone] office [phone] mobile phone

## 2019-07-25 NOTE — Progress Notes (Signed)
PROGRESS NOTE    Victoria Winters  [id number] DOB: August 20, 1951 DOA: 07/20/2019 PCP: [facility]   Brief Narrative:  Per HPI: Victoria Winters a 68 y.o.femalewith medical history significant forCVA with residual deficits, diabetes mellitus, brain aneurysms, hallucinations, frequent UTIs, CKD 4-5. History is obtained with the help of [name] who is healthcare power of attorney.The patient is awake and alert, has short-term memory loss due to her stroke and is unable to give me history,but answers simple questions.Patient lives with her sister. Over the past 4 days,patient sister reports increased shakiness, patient sometimes not respondingto questions, not as lucid as previously or as talkative.Patient also reported blurred pain with urination 4 days ago.  Yesterday while patient was using her walker and trying to go up the ramp into the house, patient became tremulous and fell. Since fall patient has not been able to ambulate. At baseline patient ambulates with walker and sometimes wheelchair. No vomiting, no loose stools, patient admitted good p.o. intake,no difficulty breathing, no cough no chest pain,no fever no chills.. Sister reports wound to patient's buttock area.  Patient sister also reports patient has an appointment(1st)to see a nephrologist but because ofCoVID, herappointment dateand change several times and is now in September.  Patient was admitted with what appears to be acute on chronic metabolic encephalopathy and associated fall that has resulted in a proximal fibula fracture on her left knee. Orthopedics recommends weightbearing and checking ankle films to ensure that there is no high ankle sprain present.  8/15:Checked ankle film with nondisplaced dorsal talus fracture noted on lateral view on the left side. Discussed with Dr. Stann Mainland with orthopedics on-call who states that this is simply a result of her ankle sprain and  should be treated as such and she should be allowed to weight-bear. Assessed by PT with recommendations for SNF. EEG with no signs of seizure activity noted. Appreciate nephrology evaluation for possible uremic encephalopathy although, this is likely not the case.  8/16: Appreciate ongoing nephrology work-up. Ongoing encephalopathy noted which does not appear to be related to uremia per nephrology. Morphine discontinued on 8/15. May require further neurology evaluation, but there is suspicion for delirium. Will require SNF on discharge.  8/18: Creatinine clearance still pending.  Patient is refusing her medications and had an episode of emesis this morning.  She continues to have some paranoia and hallucinations.  Discussion had between palliative care as well as patient and sister.  Sister is hopeful that the patient will "snap out" of this.  PT continues to recommend SNF, but patient not currently a candidate given her behavior and noncompliance.  Will start Seroquel tonight.   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 -Appreciate further evaluation as ordered by neurology with labs and aspirin increased to 325 mg with discontinuation of  Plavix -Appreciate palliative  care evaluation and change in CODE STATUS  Mechanical fall with subsequent proximal fibula fracture on left side -Spoke with orthopedist Dr. Tresa Moore who recommends weightbearing as tolerated and left ankle imaging to assess for high ankle fractureon 8/14 -Discussed again on 8/15 with Dr. Stann Mainland regarding nondisplaced dorsal talus fracture on ankle film. He has recommended weightbearing and ambulation as tolerated as this is likely secondary to ankle sprain -PTassessment appreciated with recommendations for SNF on discharge  CKD stage IV-Vwith hyperkalemiaand elevated BUN-improved -Patient has received some IV fluid with home Lasix held -She appears to have had1700cc of urine output over last 24 hours -Repeat renal panel pending -Appreciate nephrology evaluation with 24-hour creatinine clearance pending -Renal ultrasound with no findings noted  Type 2 diabetes-with hyperglycemia -Patient refusing her medications -Glucose levelsare improved and will maintain on Lantus 10 units daily for now and increase as needed. -Continue home Tradjenta -Continue SSI coverage  History of CVA/brain aneurysm/hallucinations -Patient does have short-term memory loss from prior stroke as well as intermittent hallucinations -Will start Seroquel tonight to see if there is any further improvement, may consider TTS evaluation -Continue prior statin, and high-dose aspirin for now -Appreciate neurology evaluation of any organic etiology  Hypertension- mildly elevated -Continue home Imdur, metoprolol, and clonidine -Unfortunately, patient will not take her current medications -added IV hydralazine today to assist with blood pressure management   DVT prophylaxis:SCDs Code Status: DNR/DNI Family Communication:Discussed with sister at bedside on 8/15, hospice has had a discussion with sister on 8/18 Disposition Plan:Appreciate PT evaluation with recommendations for  SNF on discharge. Appreciate nephrology evaluation with 24-hour creatinine clearancestill pending.  Appreciate nephrology ongoing evaluation.  Will start Seroquel tonight to see if this will help her overall condition.  Hopefully can plan for discharge to SNF once behavior and mentation improve.   Consultants:  Orthopedics on phone  Nephrology  Neurology  Palliative care  Procedures:  As above with EEG on 8/14 with no seizure activity  Antimicrobials:   None   Subjective: Patient seen and evaluated today with no new acute complaints or concerns. No acute concerns or events noted overnight.  She has had an episode of emesis this morning and is refusing her medications.  She is noted to be somewhat paranoid.  Objective: Vitals:   07/24/19 2011 07/24/19 2230 07/25/19 0633 07/25/19 1322  BP:  (!) 164/79 (!) 158/92 (!) 194/82  Pulse:  78 80 81  Resp:   20 16  Temp:  99.2 F (37.3 C) 98.8 F (37.1 C) 98.4 F (36.9 C)  TempSrc:  Axillary Oral Oral  SpO2: 97%  100% 99%  Weight:      Height:        Intake/Output Summary (Last 24 hours) at 07/25/2019 1524 Last data filed at 07/25/2019 1200 Gross per 24 hour  Intake 240 ml  Output 1700 ml  Net -1460 ml   Filed Weights   07/20/19 2106 07/21/19 0100  Weight: 105 kg 105 kg    Examination:  General exam: Appears calm and comfortable, confused, obese Respiratory system: Clear to auscultation. Respiratory effort normal. Cardiovascular system: S1 & S2 heard, RRR. No JVD, murmurs, rubs, gallops or clicks. No pedal edema. Gastrointestinal system: Abdomen is nondistended, soft and nontender. No organomegaly or masses felt. Normal bowel sounds heard. Central nervous system: Alert and awake, but not oriented Extremities: No edema Skin: No rashes, lesions or ulcers Psychiatry: Flat affect    Data Reviewed: I have personally reviewed following labs and imaging studies  CBC: Recent Labs  Lab 07/20/19 1353  07/21/19 0557 07/22/19 1038 07/25/19 0542  WBC 13.4* 9.8 9.8 13.5*  NEUTROABS 8.3*  --   --   --   HGB 10.5* 9.3* 9.8* 11.0*  HCT 33.7* 30.1* 31.9* 35.2*  MCV 88.9 90.7 87.9 87.1  PLT 191 173 167 [id number]   Basic Metabolic Panel: Recent Labs  Lab 07/20/19 1353 07/20/19 1631 07/21/19 0557 07/22/19 1038 07/24/19 0559 07/25/19 0542  NA 140  --  142 144 144 146*  K 6.0* 5.7* 4.6 4.5 4.2 3.9  CL 104  --  106 109 109 112*  CO2 26  --  28 28 24 23   GLUCOSE 579FGE*  --  106* 163* 194* 237*  BUN 63*  --  61* 51* 39* 43*  CREATININE 3.26*  --  3.38* 2.91* 2.40* 2.23*  CALCIUM 8.2*  --  8.1* 8.2* 8.7* 8.8*   GFR: Estimated Creatinine Clearance: 30.1 mL/min (A) (by C-G formula based on SCr of 2.23 mg/dL (H)). Liver Function Tests: Recent Labs  Lab 07/20/19 1353  AST 20  ALT 19  ALKPHOS 82  BILITOT 0.6  PROT 6.4*  ALBUMIN 3.3*   No results for input(s): LIPASE, AMYLASE in the last 168 hours. Recent Labs  Lab 07/21/19 1351  AMMONIA 31   Coagulation Profile: No results for input(s): INR, PROTIME in the last 168 hours. Cardiac Enzymes: Recent Labs  Lab 07/20/19 1353  CKTOTAL 271*   BNP (last 3 results) No results for input(s): PROBNP in the last 8760 hours. HbA1C: No results for input(s): HGBA1C in the last 72 hours. CBG: Recent Labs  Lab 07/23/19 1117 07/24/19 0809 07/24/19 2104 07/25/19 0757 07/25/19 1130  GLUCAP 178* 197* 204* 228* 301*   Lipid Profile: No results for input(s): CHOL, HDL, LDLCALC, TRIG, CHOLHDL, LDLDIRECT in the last 72 hours. Thyroid Function Tests: No results for input(s): TSH, T4TOTAL, FREET4, T3FREE, THYROIDAB in the last 72 hours. Anemia Panel: No results for input(s): VITAMINB12, FOLATE, FERRITIN, TIBC, IRON, RETICCTPCT in the last 72 hours. Sepsis Labs: No results for input(s): PROCALCITON, LATICACIDVEN in the last 168 hours.  Recent Results (from the past 240 hour(s))  SARS Coronavirus 2 [facility] order, Performed in [facility]  hospital lab) Nasopharyngeal Nasopharyngeal Swab     Status: None   Collection Time: 07/20/19  5:53 PM   Specimen: Nasopharyngeal Swab  Result Value Ref Range Status   SARS Coronavirus 2 NEGATIVE NEGATIVE Final    Comment: (NOTE) If result is NEGATIVE SARS-CoV-2 target nucleic acids are NOT DETECTED. The SARS-CoV-2 RNA is generally detectable in upper and lower  respiratory specimens during the acute phase of infection. The lowest  concentration of SARS-CoV-2 viral copies this assay can detect is 250  copies / mL. A negative result does not preclude SARS-CoV-2 infection  and should not be used as the sole basis for treatment or other  patient management decisions.  A negative result may occur with  improper specimen collection / handling, submission of specimen other  than nasopharyngeal swab, presence of viral mutation(s) within the  areas targeted by this assay, and inadequate number of viral copies  (<250 copies / mL). A negative result must be combined with clinical  observations, patient history, and epidemiological information. If result is POSITIVE SARS-CoV-2 target nucleic acids are DETECTED. The SARS-CoV-2 RNA is generally detectable in upper and lower  respiratory specimens dur ing the acute phase of infection.  Positive  results are indicative of active infection with SARS-CoV-2.  Clinical  correlation with patient history and other diagnostic information is  necessary to determine patient infection status.  Positive results do  not rule out bacterial infection or co-infection with other viruses. If result is PRESUMPTIVE POSTIVE SARS-CoV-2 nucleic acids MAY BE PRESENT.   A presumptive positive result was obtained on the submitted specimen  and confirmed on repeat testing.  While 2019 novel coronavirus  (SARS-CoV-2) nucleic acids may be present in the submitted sample  additional confirmatory testing may be necessary for epidemiological  and / or clinical management  purposes  to differentiate between  SARS-CoV-2 and other Sarbecovirus currently known to infect humans.  If clinically indicated additional testing with an alternate test  methodology [phone]) is advised. The SARS-CoV-2 RNA is generally  detectable in upper and lower respiratory sp ecimens during the acute  phase of infection. The expected result is Negative. Fact Sheet for Patients:  StrictlyIdeas.no Fact Sheet for Healthcare Providers: BankingDealers.co.za This test is not yet approved or cleared by the [place] FDA and has been authorized for detection and/or diagnosis of SARS-CoV-2 by FDA under an Emergency Use Authorization (EUA).  This EUA will remain in effect (meaning this test can be used) for the duration of the COVID-19 declaration under Section 564(b)(1) of the Act, 21 U.S.C. section 360bbb-3(b)(1), unless the authorization is terminated or revoked sooner. Performed at [facility], [address] [place], [place] [address]          Radiology Studies: No results found.      Scheduled Meds:  [START ON 07/26/2019] aspirin EC  325 mg Oral q morning - 10a   atorvastatin  40 mg Oral QHS   cloNIDine  0.2 mg Transdermal Q Sun   doxycycline  100 mg Oral q morning - 10a   enoxaparin (LOVENOX) injection  30 mg Subcutaneous Q24H   insulin aspart  0-15 Units Subcutaneous TID WC   insulin glargine  10 Units Subcutaneous QHS   isosorbide mononitrate  30 mg Oral q morning - 10a   linagliptin  5 mg Oral q morning - 10a   loratadine  10 mg Oral q morning - 10a   metoprolol  200 mg Oral q morning - 10a   pantoprazole  40 mg Oral Daily   QUEtiapine  25 mg Oral QHS   Continuous Infusions:   LOS: 4 days    Time spent: 30 minutes    Kaisei Gilbo Darleen Crocker, DO [facility] Pager [phone]  If 7PM-7AM, please contact night-coverage [email or web address] Password [facility] 07/25/2019, 3:24 PM

## 2019-07-26 DIAGNOSIS — N184 Chronic kidney disease, stage 4 (severe): Secondary | ICD-10-CM

## 2019-07-26 DIAGNOSIS — E1165 Type 2 diabetes mellitus with hyperglycemia: Secondary | ICD-10-CM

## 2019-07-26 LAB — RENAL FUNCTION PANEL
Albumin: 3.4 g/dL — ABNORMAL LOW (ref 3.5–5.0)
Anion gap: 14 (ref 5–15)
BUN: 52 mg/dL — ABNORMAL HIGH (ref 8–23)
CO2: 23 mmol/L (ref 22–32)
Calcium: 9 mg/dL (ref 8.9–10.3)
Chloride: 112 mmol/L — ABNORMAL HIGH (ref 98–111)
Creatinine, Ser: 2.67 mg/dL — ABNORMAL HIGH (ref 0.44–1.00)
GFR calc Af Amer: 20 mL/min — ABNORMAL LOW (ref >60)
GFR calc non Af Amer: 18 mL/min — ABNORMAL LOW (ref >60)
Glucose, Bld: 319 mg/dL — ABNORMAL HIGH (ref 70–99)
Phosphorus: 4.1 mg/dL (ref 2.5–4.6)
Potassium: 3.9 mmol/L (ref 3.5–5.1)
Sodium: 149 mmol/L — ABNORMAL HIGH (ref 135–145)

## 2019-07-26 LAB — CBC
HCT: 35.7 % — ABNORMAL LOW (ref 36.0–46.0)
Hemoglobin: 11.3 g/dL — ABNORMAL LOW (ref 12.0–15.0)
MCH: 27.4 pg (ref 26.0–34.0)
MCHC: 31.7 g/dL (ref 30.0–36.0)
MCV: 86.4 fL (ref 80.0–100.0)
Platelets: 273 10*3/uL (ref 150–400)
RBC: 4.13 MIL/uL (ref 3.87–5.11)
RDW: 14.2 % (ref 11.5–15.5)
WBC: 19.1 10*3/uL — ABNORMAL HIGH (ref 4.0–10.5)
nRBC: 0 % (ref 0.0–0.2)

## 2019-07-26 LAB — GLUCOSE, CAPILLARY
Glucose-Capillary: 286 mg/dL — ABNORMAL HIGH (ref 70–99)
Glucose-Capillary: 237 mg/dL — ABNORMAL HIGH (ref 70–99)
Glucose-Capillary: 249 mg/dL — ABNORMAL HIGH (ref 70–99)
Glucose-Capillary: 197 mg/dL — ABNORMAL HIGH (ref 70–99)

## 2019-07-26 LAB — VITAMIN B12: Vitamin B-12: 1040 pg/mL — ABNORMAL HIGH (ref 180–914)

## 2019-07-26 NOTE — Care Management Important Message (Signed)
Important Message  Patient Details  Name: Victoria Winters MRN: [id number] Date of Birth: 1951-11-10   Medicare Important Message Given:  Yes     Tommy Medal 07/26/2019, 2:12 PM

## 2019-07-26 NOTE — Progress Notes (Signed)
PROGRESS NOTE    Victoria Winters  [id number] DOB: [birth year] DOA: 07/20/2019 PCP: [facility]   Brief Narrative:  Per HPI: Victoria Winters a 68 y.o.femalewith medical history significant forCVA with residual deficits, diabetes mellitus, brain aneurysms, hallucinations, frequent UTIs, CKD 4-5. History is obtained with the help of sister-Victoria Winters who is healthcare power of attorney.The patient is awake and alert, has short-term memory loss due to her stroke and is unable to give me history,but answers simple questions.Patient lives with her sister. Over the past 4 days,patient sister reports increased shakiness, patient sometimes not respondingto questions, not as lucid as previously or as talkative.Patient also reported blurred pain with urination 4 days ago.  Yesterday while patient was using her walker and trying to go up the ramp into the house, patient became tremulous and fell. Since fall patient has not been able to ambulate. At baseline patient ambulates with walker and sometimes wheelchair. No vomiting, no loose stools, patient admitted good p.o. intake,no difficulty breathing, no cough no chest pain,no fever no chills.. Sister reports wound to patient's buttock area.  Patient sister also reports patient has an appointment(1st)to see a nephrologist but because ofCoVID, herappointment dateand change several times and is now in September.  Patient was admitted with what appears to be acute on chronic metabolic encephalopathy and associated fall that has resulted in a proximal fibula fracture on her left knee. Orthopedics recommends weightbearing and checking ankle films to ensure that there is no high ankle sprain present.    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   Brain aneurysm   DM (diabetes mellitus) (HCC)   Bilateral fibular fractures   CVA (cerebral vascular  accident) ([place])   Hallucination   Acute encephalopathy   Palliative care by specialist   Goals of care, counseling/discussion   History of CVA (cerebrovascular accident)   Acute on chronic metabolic encephalopathy-multifactorial with mild cognitive impairment at baseline -CT head without any acute abnormalities and brain MRI with no acute abnormalities noted -UA not suggestive of infection -TSH noted to be 1.88 and ammonia 31 -Appreciateongoing nephrology evaluation with creatinine clearance pending, but no current suspicion for uremic encephalopathy-not a candidate for hemodialysis -EEG with no seizure activity noted -Possible delirium component on top of prior memory loss from CVA. -Appreciate further evaluation as ordered by neurology with labs and aspirin increased to 325 mg with discontinuation of Plavix -Appreciate palliative care evaluation and change in CODE STATUS -Overall mental status appears to be improving with Seroquel.  Mechanical fall with subsequent proximal fibula fracture on left side -Spoke with orthopedist Dr. Tresa Moore who recommends weightbearing as tolerated and left ankle imaging to assess for high ankle fractureon 8/14 -Discussed again on 8/15 with Dr. Stann Mainland regarding nondisplaced dorsal talus fracture on ankle film. He has recommended weightbearing and ambulation as tolerated as this is likely secondary to ankle sprain -PTassessment appreciated with recommendations for SNF on discharge  CKD stage IV-Vwith hyperkalemiaand elevated BUN-improved -Patient has received some IV fluid with home Lasix held -She appears to have had450cc of urine output over last 24 hours -Creatinine is had a mild bump, likely related to decreased p.o. intake.  She is now drinking water more liberally.  Repeat labs in a.m. -Appreciate nephrology evaluation with 24-hour creatinine clearance pending -Renal ultrasound with no findings noted  Type 2 diabetes-with hyperglycemia  -Patient refusing her medications -Glucose levelsare improved and will maintain on Lantus 10  units daily for now and increase as needed. -Continue home Tradjenta -Continue SSI coverage  History of CVA/brain aneurysm/hallucinations -Patient does have short-term memory loss from prior stroke as well as intermittent hallucinations -Started on Seroquel which appears to be helping her mental status.  She is now calm, pleasant and cooperative -Continue prior statin, and high-dose aspirin for now -Appreciate neurology evaluation of any organic etiology  Hypertension- mildly elevated -Continue home Imdur, metoprolol, and clonidine -Patient has just recently restarted taking her medicines.  We will continue to follow blood pressure.   DVT prophylaxis:SCDs Code Status: DNR/DNI Family Communication:Discussed with sister at bedside on 8/19 Disposition Plan:Awaiting for skilled nursing facility placement.   Consultants:  Orthopedics on phone  Nephrology  Neurology  Palliative care  Procedures:  As above with EEG on 8/14 with no seizure activity  Antimicrobials:   None   Subjective: Patient has been much more cooperative and pleasant today.  She took her medications this morning.  She is sitting up in the chair.  She is starting to eat her food.  Objective: Vitals:   07/26/19 0601 07/26/19 0828 07/26/19 0920 07/26/19 1347  BP: (!) 186/91  (!) 153/65 (!) 155/70  Pulse: 92  92 74  Resp: 16   19  Temp: 98.2 F (36.8 C)   98.2 F (36.8 C)  TempSrc: Oral   Oral  SpO2: 95% 95%  95%  Weight:      Height:        Intake/Output Summary (Last 24 hours) at 07/26/2019 1744 Last data filed at 07/26/2019 1300 Gross per 24 hour  Intake 120 ml  Output 450 ml  Net -330 ml   Filed Weights   07/20/19 2106 07/21/19 0100  Weight: 105 kg 105 kg    Examination:  General exam: Alert, awake, no distress Respiratory system: Clear to auscultation. Respiratory effort  normal. Cardiovascular system:RRR. No murmurs, rubs, gallops. Gastrointestinal system: Abdomen is nondistended, soft and nontender. No organomegaly or masses felt. Normal bowel sounds heard. Central nervous system: Alert and oriented. No focal neurological deficits. Extremities: trace edema bilaterally Skin: No rashes, lesions or ulcers Psychiatry: pleasant, engaging in conversation, answering appropriately.      Data Reviewed: I have personally reviewed following labs and imaging studies  CBC: Recent Labs  Lab 07/20/19 1353 07/21/19 0557 07/22/19 1038 07/25/19 0542 07/26/19 0556  WBC 13.4* 9.8 9.8 13.5* 19.1*  NEUTROABS 8.3*  --   --   --   --   HGB 10.5* 9.3* 9.8* 11.0* 11.3*  HCT 33.7* 30.1* 31.9* 35.2* 35.7*  MCV 88.9 90.7 87.9 87.1 86.4  PLT 191 173 167 225 [id number]   Basic Metabolic Panel: Recent Labs  Lab 07/21/19 0557 07/22/19 1038 07/24/19 0559 07/25/19 0542 07/26/19 0555  NA 142 144 144 146* 149*  K 4.6 4.5 4.2 3.9 3.9  CL 106 109 109 112* 112*  CO2 28 28 24 23 23   GLUCOSE 106* 163* 194* 237* 319*  BUN 61* 51* 39* 43* 52*  CREATININE 3.38* 2.91* 2.40* 2.23* 2.67*  CALCIUM 8.1* 8.2* 8.7* 8.8* 9.0  PHOS  --   --   --   --  4.1   GFR: Estimated Creatinine Clearance: 25.1 mL/min (A) (by C-G formula based on SCr of 2.67 mg/dL (H)). Liver Function Tests: Recent Labs  Lab 07/20/19 1353 07/26/19 0555  AST 20  --   ALT 19  --   ALKPHOS 82  --   BILITOT 0.6  --   PROT 6.4*  --  ALBUMIN 3.3* 3.4*   No results for input(s): LIPASE, AMYLASE in the last 168 hours. Recent Labs  Lab 07/21/19 1351  AMMONIA 31   Coagulation Profile: No results for input(s): INR, PROTIME in the last 168 hours. Cardiac Enzymes: Recent Labs  Lab 07/20/19 1353  CKTOTAL 271*   BNP (last 3 results) No results for input(s): PROBNP in the last 8760 hours. HbA1C: No results for input(s): HGBA1C in the last 72 hours. CBG: Recent Labs  Lab 07/25/19 1610 07/25/19 2114  07/26/19 0717 07/26/19 1101 07/26/19 1605  GLUCAP 230* 275* 286* 237* 249*   Lipid Profile: No results for input(s): CHOL, HDL, LDLCALC, TRIG, CHOLHDL, LDLDIRECT in the last 72 hours. Thyroid Function Tests: No results for input(s): TSH, T4TOTAL, FREET4, T3FREE, THYROIDAB in the last 72 hours. Anemia Panel: Recent Labs    07/26/19 0555  VITAMINB12 1,040*   Sepsis Labs: No results for input(s): PROCALCITON, LATICACIDVEN in the last 168 hours.  Recent Results (from the past 240 hour(s))  SARS Coronavirus 2 [facility] order, Performed in [facility] hospital lab) Nasopharyngeal Nasopharyngeal Swab     Status: None   Collection Time: 07/20/19  5:53 PM   Specimen: Nasopharyngeal Swab  Result Value Ref Range Status   SARS Coronavirus 2 NEGATIVE NEGATIVE Final    Comment: (NOTE) If result is NEGATIVE SARS-CoV-2 target nucleic acids are NOT DETECTED. The SARS-CoV-2 RNA is generally detectable in upper and lower  respiratory specimens during the acute phase of infection. The lowest  concentration of SARS-CoV-2 viral copies this assay can detect is 250  copies / mL. A negative result does not preclude SARS-CoV-2 infection  and should not be used as the sole basis for treatment or other  patient management decisions.  A negative result may occur with  improper specimen collection / handling, submission of specimen other  than nasopharyngeal swab, presence of viral mutation(s) within the  areas targeted by this assay, and inadequate number of viral copies  (<250 copies / mL). A negative result must be combined with clinical  observations, patient history, and epidemiological information. If result is POSITIVE SARS-CoV-2 target nucleic acids are DETECTED. The SARS-CoV-2 RNA is generally detectable in upper and lower  respiratory specimens dur ing the acute phase of infection.  Positive  results are indicative of active infection with SARS-CoV-2.  Clinical  correlation with patient  history and other diagnostic information is  necessary to determine patient infection status.  Positive results do  not rule out bacterial infection or co-infection with other viruses. If result is PRESUMPTIVE POSTIVE SARS-CoV-2 nucleic acids MAY BE PRESENT.   A presumptive positive result was obtained on the submitted specimen  and confirmed on repeat testing.  While 2019 novel coronavirus  (SARS-CoV-2) nucleic acids may be present in the submitted sample  additional confirmatory testing may be necessary for epidemiological  and / or clinical management purposes  to differentiate between  SARS-CoV-2 and other Sarbecovirus currently known to infect humans.  If clinically indicated additional testing with an alternate test  methodology [phone]) is advised. The SARS-CoV-2 RNA is generally  detectable in upper and lower respiratory sp ecimens during the acute  phase of infection. The expected result is Negative. Fact Sheet for Patients:  StrictlyIdeas.no Fact Sheet for Healthcare Providers: BankingDealers.co.za This test is not yet approved or cleared by the [place] FDA and has been authorized for detection and/or diagnosis of SARS-CoV-2 by FDA under an Emergency Use Authorization (EUA).  This EUA will remain in effect (  meaning this test can be used) for the duration of the COVID-19 declaration under Section 564(b)(1) of the Act, 21 U.S.C. section 360bbb-3(b)(1), unless the authorization is terminated or revoked sooner. Performed at [facility], [address], [place], [place] [address]          Radiology Studies: No results found.      Scheduled Meds: . aspirin EC  325 mg Oral q morning - 10a  . atorvastatin  40 mg Oral QHS  . cloNIDine  0.2 mg Transdermal Q Winters  . doxycycline  100 mg Oral q morning - 10a  . enoxaparin (LOVENOX) injection  30 mg Subcutaneous Q24H  . insulin aspart  0-15 Units Subcutaneous TID WC  .  insulin glargine  10 Units Subcutaneous QHS  . isosorbide mononitrate  30 mg Oral q morning - 10a  . linagliptin  5 mg Oral q morning - 10a  . loratadine  10 mg Oral q morning - 10a  . metoprolol  200 mg Oral q morning - 10a  . pantoprazole  40 mg Oral Daily  . QUEtiapine  25 mg Oral QHS   Continuous Infusions:   LOS: 5 days    Time spent: 30 minutes    Kathie Dike, MD [facility]   If 7PM-7AM, please contact night-coverage [email or web address]  07/26/2019, 5:44 PM

## 2019-07-26 NOTE — Progress Notes (Signed)
Patient more cooperative today compared to yesterday. Agreeable to VS, insulin and PO meds. Ambulated with therapy and cooperatively sit in chair. Chair alarm in place, call bell and hydration within reach, will continue to monitor. 

## 2019-07-26 NOTE — Progress Notes (Signed)
Physical Therapy Treatment Patient Details Name: Victoria Winters MRN: [id number] DOB: 1951/11/12 Today's Date: 07/26/2019    History of Present Illness Patient is a 68 year old female admitted after a fall at home. Was found to have a nondisplaced left ankle fracture of the dorsal talus, proximal left fibular fracture and moderate diffuse encephalopathy with altered mental status.  PMH: CKD 4-5, brain aneurysm, DM, CVA, hallucinations, short term memory loss.    PT Comments    Patient in good mood this morning and willing to participate in therapy. Able to perform seated exercises and ambulate with RW. No objections made to care or treatment by patient during session. Fatigue limited ambulation. Patient left in chair with chair alarm on. Notified nursing. Patient would continue to benefit from skilled physical therapy to improve functional strength and endurance while in hospital.   Follow Up Recommendations  SNF;Supervision/Assistance - 24 hour;Supervision for mobility/OOB     Equipment Recommendations  None recommended by PT    Recommendations for Other Services       Precautions / Restrictions Precautions Precautions: Fall Restrictions Weight Bearing Restrictions: No LLE Weight Bearing: Weight bearing as tolerated    Mobility  Bed Mobility Overal bed mobility: Needs Assistance Bed Mobility: Supine to Sit     Supine to sit: Modified independent (Device/Increase time)     General bed mobility comments: slightly labored movement, increased time  Transfers Overall transfer level: Needs assistance Equipment used: Rolling walker (2 wheeled) Transfers: Sit to/from Stand Sit to Stand: Supervision         General transfer comment: requires some verbal/tactile cueing for redirection to tasks  Ambulation/Gait Ambulation/Gait assistance: Supervision Gait Distance (Feet): 20 Feet Assistive device: Rolling walker (2 wheeled) Gait Pattern/deviations: Decreased step length -  right;Decreased step length - left;Decreased stride length Gait velocity: decreased   General Gait Details: slightly labored cadence limited mostly due to c/o fatigue and weakness   Stairs             Wheelchair Mobility    Modified Rankin (Stroke Patients Only)       Balance Overall balance assessment: Needs assistance Sitting-balance support: Feet supported;No upper extremity supported Sitting balance-Leahy Scale: Fair Sitting balance - Comments: fair/good seated at bedside   Standing balance support: During functional activity;Bilateral upper extremity supported Standing balance-Leahy Scale: Fair Standing balance comment: using RW                            Cognition Arousal/Alertness: Awake/alert Behavior During Therapy: Flat affect Overall Cognitive Status: No family/caregiver present to determine baseline cognitive functioning Area of Impairment: Problem solving                             Problem Solving: Slow processing General Comments: Improved attention and willinigness to participate noted today.      Exercises General Exercises - Lower Extremity Long Arc Quad: AROM;Seated;Strengthening;Both;15 reps Hip Flexion/Marching: AROM;Seated;Strengthening;Both;10 reps Toe Raises: AROM;Seated;Strengthening;Both;10 reps Heel Raises: AROM;Seated;Strengthening;Both;10 reps    General Comments        Pertinent Vitals/Pain Pain Assessment: No/denies pain    Home Living                      Prior Function            PT Goals (current goals can now be found in the care plan section) Acute Rehab PT Goals Patient  Stated Goal: to go home PT Goal Formulation: With patient Time For Goal Achievement: 08/05/19 Progress towards PT goals: Progressing toward goals    Frequency    Min 3X/week      PT Plan Current plan remains appropriate    Co-evaluation              AM-PAC PT "6 Clicks" Mobility   Outcome  Measure  Help needed turning from your back to your side while in a flat bed without using bedrails?: A Little Help needed moving from lying on your back to sitting on the side of a flat bed without using bedrails?: A Little Help needed moving to and from a bed to a chair (including a wheelchair)?: A Little Help needed standing up from a chair using your arms (e.g., wheelchair or bedside chair)?: A Little Help needed to walk in hospital room?: A Little Help needed climbing 3-5 steps with a railing? : A Lot 6 Click Score: 17    End of Session   Activity Tolerance: Patient tolerated treatment well;Patient limited by fatigue;Treatment limited secondary to agitation Patient left: in chair;with call bell/phone within reach;with chair alarm set Nurse Communication: Mobility status PT Visit Diagnosis: Unsteadiness on feet (R26.81);Other abnormalities of gait and mobility (R26.89);Muscle weakness (generalized) (M62.81)     Time: [id number] PT Time Calculation (min) (ACUTE ONLY): 23 min  Charges:  $Therapeutic Exercise: 8-22 mins $Therapeutic Activity: 8-22 mins                     10:40 AM, 07/26/19 Jerene Pitch, DPT Physical Therapy with [facility]  [phone] office

## 2019-07-26 NOTE — Progress Notes (Signed)
Patient ate lunch, sister was at the bedside. Patient tried to get out of chair without assistance. When staff entered room, patient stated "I just want to get back to bed." Assisted patient back to bed with walker, needs addressed. Bed alarm applied, will continue to monitor.

## 2019-07-26 NOTE — TOC Progression Note (Signed)
Transition of Care [facility]) - Progression Note    Patient Details  Name: Victoria Winters MRN: [id number] Date of Birth: 02-14-1951  Transition of [facility]) CM/SW Contact  Boneta Lucks, RN Phone Number: 07/26/2019, 3:37 PM  Clinical Narrative:  Sister had requested we only sent to [facility], they have declined, Anise Salvo - sister she agreed for CM to sent to [facility] in [place] and [facility] in [place].  TOC to follow.    Expected Discharge Plan: [place] Barriers to Discharge: Continued Medical Work up  Expected Discharge Plan and Services Expected Discharge Plan: [place] In-house Referral: Clinical Social Work   Post Acute Care Choice: [place] arrangements for the past 2 months: Single Family Home Expected Discharge Date: 07/23/19                         Readmission Risk Interventions Readmission Risk Prevention Plan 07/21/2019  Transportation Screening Complete  Medication Review (RN CM) Complete

## 2019-07-26 NOTE — Progress Notes (Signed)
Patient ID: Victoria Winters, female   DOB: 25-Jan-1951, 68 y.o.   MRN: [id number] S: more calm and alert this am O:BP (!) 153/65   Pulse 92   Temp 98.2 F (36.8 C) (Oral)   Resp 16   Ht 5\' 7"  (1.702 m)   Wt 105 kg   SpO2 95%   BMI 36.26 kg/m   Intake/Output Summary (Last 24 hours) at 07/26/2019 1020 Last data filed at 07/26/2019 0500 Gross per 24 hour  Intake 0 ml  Output 450 ml  Net -450 ml   Intake/Output: I/O last 3 completed shifts: In: 0  Out: 1150 [Urine:1150]  Intake/Output this shift:  No intake/output data recorded. Weight change:  Gen: NAD, remains delirious, "I'm in a hospital in [place]" CVS:no rub Resp: cta Abd: benign Ext: no edema  Recent Labs  Lab 07/20/19 1353 07/20/19 1631 07/21/19 0557 07/22/19 1038 07/24/19 0559 07/25/19 0542 07/26/19 0555  NA 140  --  142 144 144 146* 149*  K 6.0* 5.7* 4.6 4.5 4.2 3.9 3.9  CL 104  --  106 109 109 112* 112*  CO2 26  --  28 28 24 23 23   GLUCOSE 192*  --  106* 163* 194* 237* 319*  BUN 63*  --  61* 51* 39* 43* 52*  CREATININE 3.26*  --  3.38* 2.91* 2.40* 2.23* 2.67*  ALBUMIN 3.3*  --   --   --   --   --  3.4*  CALCIUM 8.2*  --  8.1* 8.2* 8.7* 8.8* 9.0  PHOS  --   --   --   --   --   --  4.1  AST 20  --   --   --   --   --   --   ALT 19  --   --   --   --   --   --    Liver Function Tests: Recent Labs  Lab 07/20/19 1353 07/26/19 0555  AST 20  --   ALT 19  --   ALKPHOS 82  --   BILITOT 0.6  --   PROT 6.4*  --   ALBUMIN 3.3* 3.4*   No results for input(s): LIPASE, AMYLASE in the last 168 hours. Recent Labs  Lab 07/21/19 1351  AMMONIA 31   CBC: Recent Labs  Lab 07/20/19 1353 07/21/19 0557 07/22/19 1038 07/25/19 0542 07/26/19 0556  WBC 13.4* 9.8 9.8 13.5* 19.1*  NEUTROABS 8.3*  --   --   --   --   HGB 10.5* 9.3* 9.8* 11.0* 11.3*  HCT 33.7* 30.1* 31.9* 35.2* 35.7*  MCV 88.9 90.7 87.9 87.1 86.4  PLT 191 173 167 225 273   Cardiac Enzymes: Recent Labs  Lab 07/20/19 1353  CKTOTAL 271*    CBG: Recent Labs  Lab 07/25/19 0757 07/25/19 1130 07/25/19 1610 07/25/19 2114 07/26/19 0717  GLUCAP 228* 301* 230* 275* 286*    Iron Studies: No results for input(s): IRON, TIBC, TRANSFERRIN, FERRITIN in the last 72 hours. Studies/Results: No results found. Marland Kitchen aspirin EC  325 mg Oral q morning - 10a  . atorvastatin  40 mg Oral QHS  . cloNIDine  0.2 mg Transdermal Q Sun  . doxycycline  100 mg Oral q morning - 10a  . enoxaparin (LOVENOX) injection  30 mg Subcutaneous Q24H  . insulin aspart  0-15 Units Subcutaneous TID WC  . insulin glargine  10 Units Subcutaneous QHS  . isosorbide mononitrate  30 mg Oral q morning -  10a  . linagliptin  5 mg Oral q morning - 10a  . loratadine  10 mg Oral q morning - 10a  . metoprolol  200 mg Oral q morning - 10a  . pantoprazole  40 mg Oral Daily  . QUEtiapine  25 mg Oral QHS    BMET    Component Value Date/Time   NA 149 (H) 07/26/2019 0555   K 3.9 07/26/2019 0555   CL 112 (H) 07/26/2019 0555   CO2 23 07/26/2019 0555   GLUCOSE 319 (H) 07/26/2019 0555   BUN 52 (H) 07/26/2019 0555   CREATININE 2.67 (H) 07/26/2019 0555   CALCIUM 9.0 07/26/2019 0555   GFRNONAA 18 (L) 07/26/2019 0555   GFRAA 20 (L) 07/26/2019 0555   CBC    Component Value Date/Time   WBC 19.1 (H) 07/26/2019 0556   RBC 4.13 07/26/2019 0556   HGB 11.3 (L) 07/26/2019 0556   HCT 35.7 (L) 07/26/2019 0556   PLT 273 07/26/2019 0556   MCV 86.4 07/26/2019 0556   MCH 27.4 07/26/2019 0556   MCHC 31.7 07/26/2019 0556   RDW 14.2 07/26/2019 0556   LYMPHSABS 3.2 07/20/2019 1353   MONOABS 1.1 (H) 07/20/2019 1353   EOSABS 0.6 (H) 07/20/2019 1353   BASOSABS 0.1 07/20/2019 1353     Assessment/Plan:  1. AKI on presumed CKD of unclear stage (from DM/HTN) presumably from hemodynamic alterations which have improved since admission.  Awaiting 24 hour creatinine clearance.  Unknown baseline Cr as she has received care in [place] and will need to contact her daughter for more  information.  She will require outpatient follow up with Nephrology if she agrees as she has been refusing care during this hospitalization.  Renal US without hydro or masses.   1. Bump in BUN/Cr likely due to her refusing to eat or drink as well as emesis. 2. Continue to follow UOP and Cr. 3. Encourage po intake and follow. 4. Not a candidate for dialysis. 2. AMS- CT with old strokes, nothing acute, same with MRI.  Neurology following.  Negative EEG.  Further workup underway. 3. Anemia of CKD- no indication for ESA at this time 4. HTN- continue with meds 5. Paranoia and noncompliance- difficult to care for her when refusing therapy.  Seems more amenable this am.  6. Disposition- difficult to assess her competence due to her encephalopathy and underlying cognitive impairment.  Agree with Palliative care consult to help set goals/limits of care.   Donetta Potts, MD [employer] [phone]

## 2019-07-27 LAB — CBC
HCT: 36 % (ref 36.0–46.0)
Hemoglobin: 11.6 g/dL — ABNORMAL LOW (ref 12.0–15.0)
MCH: 27.6 pg (ref 26.0–34.0)
MCHC: 32.2 g/dL (ref 30.0–36.0)
MCV: 85.5 fL (ref 80.0–100.0)
Platelets: 280 10*3/uL (ref 150–400)
RBC: 4.21 MIL/uL (ref 3.87–5.11)
RDW: 14.1 % (ref 11.5–15.5)
WBC: 21.6 10*3/uL — ABNORMAL HIGH (ref 4.0–10.5)
nRBC: 0 % (ref 0.0–0.2)

## 2019-07-27 LAB — RENAL FUNCTION PANEL
Albumin: 3.3 g/dL — ABNORMAL LOW (ref 3.5–5.0)
Anion gap: 11 (ref 5–15)
BUN: 65 mg/dL — ABNORMAL HIGH (ref 8–23)
CO2: 22 mmol/L (ref 22–32)
Calcium: 8.5 mg/dL — ABNORMAL LOW (ref 8.9–10.3)
Chloride: 108 mmol/L (ref 98–111)
Creatinine, Ser: 2.82 mg/dL — ABNORMAL HIGH (ref 0.44–1.00)
GFR calc Af Amer: 19 mL/min — ABNORMAL LOW (ref >60)
GFR calc non Af Amer: 17 mL/min — ABNORMAL LOW (ref >60)
Glucose, Bld: 216 mg/dL — ABNORMAL HIGH (ref 70–99)
Phosphorus: 3.5 mg/dL (ref 2.5–4.6)
Potassium: 3.5 mmol/L (ref 3.5–5.1)
Sodium: 141 mmol/L (ref 135–145)

## 2019-07-27 LAB — GLUCOSE, CAPILLARY
Glucose-Capillary: 204 mg/dL — ABNORMAL HIGH (ref 70–99)
Glucose-Capillary: 170 mg/dL — ABNORMAL HIGH (ref 70–99)
Glucose-Capillary: 181 mg/dL — ABNORMAL HIGH (ref 70–99)
Glucose-Capillary: 137 mg/dL — ABNORMAL HIGH (ref 70–99)

## 2019-07-27 LAB — RPR: RPR Ser Ql: NONREACTIVE

## 2019-07-27 LAB — HOMOCYSTEINE: Homocysteine: 11.7 umol/L (ref 0.0–17.2)

## 2019-07-27 MED ORDER — BISACODYL 10 MG RE SUPP
10.0000 mg | Freq: Once | RECTAL | Status: DC
  Filled 2019-07-27: qty 1

## 2019-07-27 MED ORDER — SODIUM CHLORIDE 0.9 % IV SOLN
INTRAVENOUS | Status: AC
  Administered 2019-07-27: 23:00:00 via INTRAVENOUS

## 2019-07-27 MED ORDER — POLYETHYLENE GLYCOL 3350 17 G PO PACK
17.0000 g | PACK | Freq: Every day | ORAL | Status: DC
  Administered 2019-07-27 – 2019-07-28 (×2): 17 g via ORAL
  Filled 2019-07-27 (×3): qty 1

## 2019-07-27 NOTE — Progress Notes (Signed)
[facility] Progress Note    Assessment/ Plan:   1. AKIon presumed CKD of unclear stage (from DM/HTN) presumably from hemodynamic alterations which have improved since admission. Awaiting 24 hour creatinine clearance. Unknown baseline Cr as she has received care in [place] and will need to contact her daughter for more information. She will require outpatient follow up with Nephrology if she agrees as she has been refusing care during this hospitalization. Renal US without hydro or masses.  1. Bump in BUN/Cr likely due to her refusing to eat or drink as well as emesis. 2. Continue to follow UOP and Cr 3. Encourage po intake and follow-- she is doing better with this. 4. Not a candidate for dialysis. 2. AMS- CT with old strokes, nothing acute, same with MRI. Neurology following. Negative EEG.  On seroquel. 3. Anemia of CKD- no indication for ESA at this time 4. HTN- continue with meds 5. Paranoia and noncompliance- waxes and wanes 6. Disposition- pending, appreciate pall care c/s greatly  Subjective:    Seen in room.  Na better, Cr still inching up a little.  Amenable to drinking more water.   Objective:   BP (!) 134/111   Pulse 74   Temp 98.3 F (36.8 C) (Oral)   Resp 17   Ht 5\' 7"  (1.702 m)   Wt 105 kg   SpO2 91%   BMI 36.26 kg/m   Intake/Output Summary (Last 24 hours) at 07/27/2019 0903 Last data filed at 07/26/2019 1700 Gross per 24 hour  Intake 240 ml  Output 400 ml  Net -160 ml   Weight change:   Physical Exam: Gen: NAD, sitting in bed CVS: RRR Resp:clear Abd: soft Ext: no LE edema SKIN: poor turgor with mild tenting  Imaging: No results found.  Labs: BMET Recent Labs  Lab 07/20/19 1353 07/20/19 1631 07/21/19 0557 07/22/19 1038 07/24/19 0559 07/25/19 0542 07/26/19 0555 07/27/19 0706  NA 140  --  142 144 144 146* 149* 141  K 6.0* 5.7* 4.6 4.5 4.2 3.9 3.9 3.5  CL 104  --  106 109 109 112* 112* 108  CO2 26  --  28 28 24 23 23  22   GLUCOSE 192*  --  106* 163* 194* 237* 319* 216*  BUN 63*  --  61* 51* 39* 43* 52* 65*  CREATININE 3.26*  --  3.38* 2.91* 2.40* 2.23* 2.67* 2.82*  CALCIUM 8.2*  --  8.1* 8.2* 8.7* 8.8* 9.0 8.5*  PHOS  --   --   --   --   --   --  4.1 3.5   CBC Recent Labs  Lab 07/20/19 1353  07/22/19 1038 07/25/19 0542 07/26/19 0556 07/27/19 0706  WBC 13.4*   < > 9.8 13.5* 19.1* 21.6*  NEUTROABS 8.3*  --   --   --   --   --   HGB 10.5*   < > 9.8* 11.0* 11.3* 11.6*  HCT 33.7*   < > 31.9* 35.2* 35.7* 36.0  MCV 88.9   < > 87.9 87.1 86.4 85.5  PLT 191   < > 167 225 273 280   < > = values in this interval not displayed.    Medications:    . aspirin EC  325 mg Oral q morning - 10a  . atorvastatin  40 mg Oral QHS  . cloNIDine  0.2 mg Transdermal Q Sun  . doxycycline  100 mg Oral q morning - 10a  . enoxaparin (LOVENOX) injection  30 mg Subcutaneous Q24H  . insulin aspart  0-15 Units Subcutaneous TID WC  . insulin glargine  10 Units Subcutaneous QHS  . isosorbide mononitrate  30 mg Oral q morning - 10a  . linagliptin  5 mg Oral q morning - 10a  . loratadine  10 mg Oral q morning - 10a  . metoprolol  200 mg Oral q morning - 10a  . pantoprazole  40 mg Oral Daily  . QUEtiapine  25 mg Oral QHS      Madelon Lips, MD 07/27/2019, 9:03 AM

## 2019-07-27 NOTE — Progress Notes (Signed)
PROGRESS NOTE    Victoria Winters  [id number] DOB: Mar 28, 1951 DOA: 07/20/2019 PCP: [facility]   Brief Narrative:  Per HPI: Victoria Winters a 68 y.o.femalewith medical history significant forCVA with residual deficits, diabetes mellitus, brain aneurysms, hallucinations, frequent UTIs, CKD 4-5. History is obtained with the help of [name] who is healthcare power of attorney.The patient is awake and alert, has short-term memory loss due to her stroke and is unable to give me history,but answers simple questions.Patient lives with her sister. Over the past 4 days,patient sister reports increased shakiness, patient sometimes not respondingto questions, not as lucid as previously or as talkative.Patient also reported blurred pain with urination 4 days ago.  Yesterday while patient was using her walker and trying to go up the ramp into the house, patient became tremulous and fell. Since fall patient has not been able to ambulate. At baseline patient ambulates with walker and sometimes wheelchair. No vomiting, no loose stools, patient admitted good p.o. intake,no difficulty breathing, no cough no chest pain,no fever no chills.. Sister reports wound to patient's buttock area.  Patient sister also reports patient has an appointment(1st)to see a nephrologist but because ofCoVID, herappointment dateand change several times and is now in September.  Patient was admitted with what appears to be acute on chronic metabolic encephalopathy and associated fall that has resulted in a proximal fibula fracture on her left knee. Orthopedics recommends weightbearing and checking ankle films to ensure that there is no high ankle sprain present.    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   Brain aneurysm   DM (diabetes mellitus) (HCC)   Bilateral fibular fractures   CVA (cerebral vascular  accident) ([place])   Hallucination   Acute encephalopathy   Palliative care by specialist   Goals of care, counseling/discussion   History of CVA (cerebrovascular accident)   Acute on chronic metabolic encephalopathy-multifactorial with mild cognitive impairment at baseline -CT head without any acute abnormalities and brain MRI with no acute abnormalities noted -UA not suggestive of infection -TSH noted to be 1.88 and ammonia 31 -Appreciateongoing nephrology evaluation with creatinine clearance pending, but no current suspicion for uremic encephalopathy-not a candidate for hemodialysis -EEG with no seizure activity noted -Possible delirium component on top of prior memory loss from CVA. -Appreciate further evaluation as ordered by neurology with labs and aspirin increased to 325 mg with discontinuation of Plavix -Appreciate palliative care evaluation and change in CODE STATUS -Overall mental status appears to be improving with Seroquel.  Mechanical fall with subsequent proximal fibula fracture on left side -Spoke with orthopedist Dr. Tresa Moore who recommends weightbearing as tolerated and left ankle imaging to assess for high ankle fractureon 8/14 -Discussed again on 8/15 with Dr. Stann Mainland regarding nondisplaced dorsal talus fracture on ankle film. He has recommended weightbearing and ambulation as tolerated as this is likely secondary to ankle sprain -PTassessment appreciated with recommendations for SNF on discharge  CKD stage IV-Vwith hyperkalemiaand elevated BUN-improved -Patient has received some IV fluid with home Lasix held -She appears to have had400cc of urine output over last 24 hours -Creatinine has continued to trend up, likely related to decreased p.o. intake.  She is now drinking water more liberally.  Repeat labs in a.m. -Appreciate nephrology evaluation with 24-hour creatinine clearance pending -Renal ultrasound with no findings noted -We will give a trial of gentle  hydration.  Type 2 diabetes-with hyperglycemia -Patient intermittently refusing her medications -  Glucose levelsare improved and will maintain on Lantus 10 units daily for now and increase as needed. -Continue home Tradjenta -Continue SSI coverage  History of CVA/brain aneurysm/hallucinations -Patient does have short-term memory loss from prior stroke as well as intermittent hallucinations -Started on Seroquel which appears to be helping her mental status.  She is now calm, pleasant and cooperative current treatments -Continue prior statin, and high-dose aspirin for now -Appreciate neurology evaluation of any organic etiology  Hypertension- mildly elevated -Continue home Imdur, metoprolol, and clonidine -Patient has just recently restarted taking her medicines.  We will continue to follow blood pressure.  Leukocytosis -Etiology is unclear, she does not have any signs of sepsis -It is concerning that her WBC count continues to trend up -She has been afebrile -Possibly related to hemoconcentration from dehydration -We will give a trial of IV fluids -If remains elevated, may need to consider abdominal imaging. -Check urinalysis   DVT prophylaxis:SCDs Code Status: DNR/DNI Family Communication:Discussed with sister at bedside on 8/19 Disposition Plan:Awaiting for skilled nursing facility placement.   Consultants:  Orthopedics on phone  Nephrology  Neurology  Palliative care  Procedures:  As above with EEG on 8/14 with no seizure activity  Antimicrobials:   None   Subjective: She is having some nausea and had vomiting earlier.  She denies any abdominal pain.  She is unsure whether she has had a bowel movement recently.  She does not have any shortness of breath or cough.  Objective: Vitals:   07/27/19 0555 07/27/19 0811 07/27/19 0830 07/27/19 1320  BP: (!) 167/77 (!) 134/111  (!) 159/65  Pulse: 72 74  70  Resp: 17   17  Temp: 98.3 F (36.8 C)    98.9 F (37.2 C)  TempSrc: Oral   Oral  SpO2: 92%  91% 98%  Weight:      Height:        Intake/Output Summary (Last 24 hours) at 07/27/2019 1825 Last data filed at 07/27/2019 1700 Gross per 24 hour  Intake 600 ml  Output 900 ml  Net -300 ml   Filed Weights   07/20/19 2106 07/21/19 0100  Weight: 105 kg 105 kg    Examination:  General exam: Alert, awake, no distress Respiratory system: Clear to auscultation. Respiratory effort normal. Cardiovascular system:RRR. No murmurs, rubs, gallops. Gastrointestinal system: Abdomen is distended, soft and nontender. No organomegaly or masses felt. Normal bowel sounds heard. Central nervous system:No focal neurological deficits. Extremities: No C/C/E, +pedal pulses Skin: No rashes, lesions or ulcers Psychiatry: Pleasant, calm, answering questions   Data Reviewed: I have personally reviewed following labs and imaging studies  CBC: Recent Labs  Lab 07/21/19 0557 07/22/19 1038 07/25/19 0542 07/26/19 0556 07/27/19 0706  WBC 9.8 9.8 13.5* 19.1* 21.6*  HGB 9.3* 9.8* 11.0* 11.3* 11.6*  HCT 30.1* 31.9* 35.2* 35.7* 36.0  MCV 90.7 87.9 87.1 86.4 85.5  PLT 173 167 225 273 [id number]   Basic Metabolic Panel: Recent Labs  Lab 07/22/19 1038 07/24/19 0559 07/25/19 0542 07/26/19 0555 07/27/19 0706  NA 144 144 146* 149* 141  K 4.5 4.2 3.9 3.9 3.5  CL 109 109 112* 112* 108  CO2 28 24 23 23 22   GLUCOSE 163* 194* 237* 319* 216*  BUN 51* 39* 43* 52* 65*  CREATININE 2.91* 2.40* 2.23* 2.67* 2.82*  CALCIUM 8.2* 8.7* 8.8* 9.0 8.5*  PHOS  --   --   --  4.1 3.5   GFR: Estimated Creatinine Clearance: 23.8 mL/min (A) (by C-G formula  based on SCr of 2.82 mg/dL (H)). Liver Function Tests: Recent Labs  Lab 07/26/19 0555 07/27/19 0706  ALBUMIN 3.4* 3.3*   No results for input(s): LIPASE, AMYLASE in the last 168 hours. Recent Labs  Lab 07/21/19 1351  AMMONIA 31   Coagulation Profile: No results for input(s): INR, PROTIME in the last 168 hours.  Cardiac Enzymes: No results for input(s): CKTOTAL, CKMB, CKMBINDEX, TROPONINI in the last 168 hours. BNP (last 3 results) No results for input(s): PROBNP in the last 8760 hours. HbA1C: No results for input(s): HGBA1C in the last 72 hours. CBG: Recent Labs  Lab 07/26/19 1605 07/26/19 2058 07/27/19 0729 07/27/19 1113 07/27/19 1615  GLUCAP 249* 197* 204* 170* 181*   Lipid Profile: No results for input(s): CHOL, HDL, LDLCALC, TRIG, CHOLHDL, LDLDIRECT in the last 72 hours. Thyroid Function Tests: No results for input(s): TSH, T4TOTAL, FREET4, T3FREE, THYROIDAB in the last 72 hours. Anemia Panel: Recent Labs    07/26/19 0555  VITAMINB12 1,040*   Sepsis Labs: No results for input(s): PROCALCITON, LATICACIDVEN in the last 168 hours.  Recent Results (from the past 240 hour(s))  SARS Coronavirus 2 [facility], Performed in [facility] hospital lab) Nasopharyngeal Nasopharyngeal Swab     Status: None   Collection Time: 07/20/19  5:53 PM   Specimen: Nasopharyngeal Swab  Result Value Ref Range Status   SARS Coronavirus 2 NEGATIVE NEGATIVE Final    Comment: (NOTE) If result is NEGATIVE SARS-CoV-2 target nucleic acids are NOT DETECTED. The SARS-CoV-2 RNA is generally detectable in upper and lower  respiratory specimens during the acute phase of infection. The lowest  concentration of SARS-CoV-2 viral copies this assay can detect is 250  copies / mL. A negative result does not preclude SARS-CoV-2 infection  and should not be used as the sole basis for treatment or other  patient management decisions.  A negative result may occur with  improper specimen collection / handling, submission of specimen other  than nasopharyngeal swab, presence of viral mutation(s) within the  areas targeted by this assay, and inadequate number of viral copies  (<250 copies / mL). A negative result must be combined with clinical  observations, patient history, and epidemiological information. If  result is POSITIVE SARS-CoV-2 target nucleic acids are DETECTED. The SARS-CoV-2 RNA is generally detectable in upper and lower  respiratory specimens dur ing the acute phase of infection.  Positive  results are indicative of active infection with SARS-CoV-2.  Clinical  correlation with patient history and other diagnostic information is  necessary to determine patient infection status.  Positive results do  not rule out bacterial infection or co-infection with other viruses. If result is PRESUMPTIVE POSTIVE SARS-CoV-2 nucleic acids MAY BE PRESENT.   A presumptive positive result was obtained on the submitted specimen  and confirmed on repeat testing.  While 2019 novel coronavirus  (SARS-CoV-2) nucleic acids may be present in the submitted sample  additional confirmatory testing may be necessary for epidemiological  and / or clinical management purposes  to differentiate between  SARS-CoV-2 and other Sarbecovirus currently known to infect humans.  If clinically indicated additional testing with an alternate test  methodology [phone]) is advised. The SARS-CoV-2 RNA is generally  detectable in upper and lower respiratory sp ecimens during the acute  phase of infection. The expected result is Negative. Fact Sheet for Patients:  StrictlyIdeas.no Fact Sheet for Healthcare Providers: BankingDealers.co.za This test is not yet approved or cleared by the [place] FDA and has been authorized  for detection and/or diagnosis of SARS-CoV-2 by FDA under an Emergency Use Authorization (EUA).  This EUA will remain in effect (meaning this test can be used) for the duration of the COVID-19 declaration under Section 564(b)(1) of the Act, 21 U.S.C. section 360bbb-3(b)(1), unless the authorization is terminated or revoked sooner. Performed at [facility], [address], [place], [place] [address]          Radiology Studies: No results found.       Scheduled Meds: . aspirin EC  325 mg Oral q morning - 10a  . atorvastatin  40 mg Oral QHS  . cloNIDine  0.2 mg Transdermal Q Sun  . doxycycline  100 mg Oral q morning - 10a  . enoxaparin (LOVENOX) injection  30 mg Subcutaneous Q24H  . insulin aspart  0-15 Units Subcutaneous TID WC  . insulin glargine  10 Units Subcutaneous QHS  . isosorbide mononitrate  30 mg Oral q morning - 10a  . linagliptin  5 mg Oral q morning - 10a  . loratadine  10 mg Oral q morning - 10a  . metoprolol  200 mg Oral q morning - 10a  . pantoprazole  40 mg Oral Daily  . QUEtiapine  25 mg Oral QHS   Continuous Infusions:   LOS: 6 days    Time spent: 30 minutes    Kathie Dike, MD [facility]   If 7PM-7AM, please contact night-coverage [email or web address]  07/27/2019, 6:25 PM

## 2019-07-27 NOTE — Progress Notes (Signed)
Daily Progress Note   Patient Name: Victoria Winters       Date: 07/27/2019 DOB: Dec 28, 1950  Age: 68 y.o. MRN#: [id number] Attending Physician: Kathie Dike, MD Primary Care Physician: [facility] Date: 07/20/2019  Reason for Consultation/Follow-up: Establishing goals of care  Subjective: Daneah is awake. Alert to self. Has been more cooperative the last 2 days but complaining of nausea this afternoon. Instructed RN to give prn Zofran.   F/u with sister, [name] in waiting room. Discussed diagnoses, interventions, and plan of care. Discussed RN CM/SW involvement with bed search. [name] hopeful to get Izora Gala into a SNF for rehab. She plans to bring her home following rehab. We again discussed her poor candidacy for dialysis. [name] shares that Beronica's daughter Jonelle Sidle always states "her kidneys always bounce back." I did explain ongoing medical management for kidney disease but irreversible nature of her condition and likely a cycle moving forward/high risk for kidneys to fail at some point. [name] does seem to understand this. Answered questions. [name] agreeable with outpatient palliative referral at [facility].   Length of Stay: 6  Current Medications: Scheduled Meds:  . aspirin EC  325 mg Oral q morning - 10a  . atorvastatin  40 mg Oral QHS  . cloNIDine  0.2 mg Transdermal Q Sun  . doxycycline  100 mg Oral q morning - 10a  . enoxaparin (LOVENOX) injection  30 mg Subcutaneous Q24H  . insulin aspart  0-15 Units Subcutaneous TID WC  . insulin glargine  10 Units Subcutaneous QHS  . isosorbide mononitrate  30 mg Oral q morning - 10a  . linagliptin  5 mg Oral q morning - 10a  . loratadine  10 mg Oral q morning - 10a  . metoprolol  200 mg Oral q morning - 10a  .  pantoprazole  40 mg Oral Daily  . QUEtiapine  25 mg Oral QHS    Continuous Infusions:   PRN Meds: acetaminophen **OR** acetaminophen, hydrALAZINE, nystatin, ondansetron **OR** ondansetron (ZOFRAN) IV, polyethylene glycol  Physical Exam Vitals signs and nursing note reviewed.  Constitutional:      General: She is awake.     Appearance: She is ill-appearing.  HENT:     Head: Normocephalic and atraumatic.  Pulmonary:     Effort: No tachypnea, accessory muscle usage or  respiratory distress.  Skin:    General: Skin is warm and dry.  Neurological:     Mental Status: She is alert.     Comments: Oriented to person. Baseline cognitive impairment.             Vital Signs: BP (!) 159/65 (BP Location: Left Arm)   Pulse 70   Temp 98.9 F (37.2 C) (Oral)   Resp 17   Ht 5\' 7"  (1.702 m)   Wt 105 kg   SpO2 98%   BMI 36.26 kg/m  SpO2: SpO2: 98 % O2 Device: O2 Device: Room Air O2 Flow Rate:    Intake/output summary:   Intake/Output Summary (Last 24 hours) at 07/27/2019 1415 Last data filed at 07/27/2019 1300 Gross per 24 hour  Intake 600 ml  Output 1000 ml  Net -400 ml   LBM: Last BM Date: 07/25/19 Baseline Weight: Weight: 105 kg Most recent weight: Weight: 105 kg       Palliative Assessment/Data: PPS 40%    Flowsheet Rows     Most Recent Value  Intake Tab  Referral Department  Hospitalist  Unit at Time of Referral  Med/Surg Unit  Palliative Care Primary Diagnosis  Neurology  Palliative Care Type  New Palliative care  Reason for referral  Clarify Goals of Care  Date first seen by Palliative Care  07/25/19  Clinical Assessment  Palliative Performance Scale Score  40%  Psychosocial & Spiritual Assessment  Palliative Care Outcomes  Patient/Family meeting held?  Yes  Who was at the meeting?  sister/HCPOA  Palliative Care Outcomes  Clarified goals of care, Counseled regarding hospice, Provided end of life care assistance, Provided advance care planning, Provided  psychosocial or spiritual support, Changed CPR status, Completed durable DNR, ACP counseling assistance, Linked to palliative care logitudinal support      Patient Active Problem List   Diagnosis Date Noted  . Palliative care by specialist   . Goals of care, counseling/discussion   . History of CVA (cerebrovascular accident)   . Acute encephalopathy 07/21/2019  . Fall 07/20/2019  . 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 Brain aneurysm 07/20/2019  . DM (diabetes mellitus) ([place]) 07/20/2019  . Bilateral fibular fractures 07/20/2019  . CVA (cerebral vascular accident) ([place]) 07/20/2019  . Hallucination 07/20/2019    Palliative Care Assessment & Plan   Patient Profile: 68 y.o. female  with past medical history of CVA with residual deficits, DM, brain aneurysms, hallucinations, frequent UTI's, CKD 4-5  admitted on 07/20/2019 with fall and altered mental status. Hospital admission for acute on chronic metabolic encephalopathy and associated fall resulting in proximal fibula fracture. Ortho recommending weightbearing as tolerated. PT recommending SNF for rehab. Nephrology following. Patient is a poor candidate for dialysis with encephalopathy and refusal of medications/labs. Neurology following for ongoing encephalopathy. CT/MRI/EEG negative for acute findings. Per neurology, acute encephalopathy multifactorial with baseline cognitive impairment, significant depression or developing dementia, and multiple bilateral remote lacunar infarcts. Palliative medicine consultation for goals of care.   Assessment: Acute on chronic metabolic encephalopathy Mechanical fall CKD stage IV-V Type 2 DM Hx of CVA/brain aneurysm/hallucinations  Recommendations/Plan:  GOC discussed with patient's sister/HCPOA, Willeen Cass on 8/18. HCPOA paperwork copy in chart.   Electronic MOST form completed. DNR/DNI, limited interventions including re-hospitalization, IVF/ABX if indicated, and short-term feeding  tube if indicated. Copies of MOST and Durable DNR made for sister and chart.   Sister hopeful Annia will "snap out" of the paranoia (which is new since admission), begin  eating/drinking again, and work with therapy. Sister requesting SNF to attempt rehab. Sister cannot take [name] home until she can stand and take a few steps on her own. Sister hopeful for Vincy to return home after SNF. Continue HS Seroquel.  Poor candidate for hemodialysis. Sister does seem to understand this.   Outpatient palliative referral at SNF. Sister agreeable.   Code Status: DNR/DNI   Code Status Orders  (From admission, onward)         Start     Ordered   07/25/19 1407  Do not attempt resuscitation (DNR)  Continuous    Question Answer Comment  In the event of cardiac or respiratory ARREST Do not call a "code blue"   In the event of cardiac or respiratory ARREST Do not perform Intubation, CPR, defibrillation or ACLS   In the event of cardiac or respiratory ARREST Use medication by any route, position, wound care, and other measures to relive pain and suffering. May use oxygen, suction and manual treatment of airway obstruction as needed for comfort.      07/25/19 1406        Code Status History    Date Active Date Inactive Code Status Order ID Comments User Context   07/20/2019 2211 07/25/2019 1406 Full Code [id number]  Bethena Roys, MD Inpatient   Advance Care Planning Activity    Advance Directive Documentation     Most Recent Value  Type of Advance Directive  Healthcare Power of [place], Living will  Pre-existing out of facility DNR order (yellow form or pink MOST form)  -  "MOST" Form in Place?  -       Prognosis:   Unable to determine  Discharge Planning:  [place] for rehab with Palliative care service follow-up  Care plan was discussed with RN, Dr Roderic Palau, sister  Thank you for allowing the Palliative Medicine Team to assist in the care of this patient.   Time  In: 1340 Time Out: 1400 Total Time 20 Prolonged Time Billed no      Greater than 50%  of this time was spent counseling and coordinating care related to the above assessment and plan.  Ihor Dow, DNP, FNP-C Palliative Medicine Team  Phone: [phone] Fax: [phone]  Please contact Palliative Medicine Team phone at [phone] for questions and concerns.

## 2019-07-27 NOTE — TOC Progression Note (Signed)
Transition of Care [facility]) - Progression Note    Patient Details  Name: Victoria Winters MRN: [id number] Date of Birth: 06-27-1951  Transition of Care [facility]) CM/SW Contact  Boneta Lucks, RN Phone Number: 07/27/2019, 11:38 AM  Clinical Narrative:   Tereasa Coop -sister,  [facility] and [facility] declined. Advise sister we need to send out to multiple facilities at this time to see if we can get a bed offer. Patient is doing much better. TOC to follow and update sister with any bed offers.     Expected Discharge Plan: [place] Barriers to Discharge: Continued Medical Work up  Expected Discharge Plan and Services Expected Discharge Plan: [place] In-house Referral: Clinical Social Work   Post Acute Care Choice: [place] Living arrangements for the past 2 months: Single Family Home                  Readmission Risk Interventions Readmission Risk Prevention Plan 07/21/2019  Transportation Screening Complete  Medication Review (RN CM) Complete

## 2019-07-28 LAB — GLUCOSE, CAPILLARY
Glucose-Capillary: 152 mg/dL — ABNORMAL HIGH (ref 70–99)
Glucose-Capillary: 163 mg/dL — ABNORMAL HIGH (ref 70–99)
Glucose-Capillary: 110 mg/dL — ABNORMAL HIGH (ref 70–99)
Glucose-Capillary: 140 mg/dL — ABNORMAL HIGH (ref 70–99)

## 2019-07-28 LAB — CBC
HCT: 34 % — ABNORMAL LOW (ref 36.0–46.0)
Hemoglobin: 10.9 g/dL — ABNORMAL LOW (ref 12.0–15.0)
MCH: 27.5 pg (ref 26.0–34.0)
MCHC: 32.1 g/dL (ref 30.0–36.0)
MCV: 85.6 fL (ref 80.0–100.0)
Platelets: 251 10*3/uL (ref 150–400)
RBC: 3.97 MIL/uL (ref 3.87–5.11)
RDW: 13.7 % (ref 11.5–15.5)
WBC: 15.2 10*3/uL — ABNORMAL HIGH (ref 4.0–10.5)
nRBC: 0 % (ref 0.0–0.2)

## 2019-07-28 LAB — RENAL FUNCTION PANEL
Albumin: 3.1 g/dL — ABNORMAL LOW (ref 3.5–5.0)
Anion gap: 11 (ref 5–15)
BUN: 56 mg/dL — ABNORMAL HIGH (ref 8–23)
CO2: 23 mmol/L (ref 22–32)
Calcium: 8.1 mg/dL — ABNORMAL LOW (ref 8.9–10.3)
Chloride: 109 mmol/L (ref 98–111)
Creatinine, Ser: 2.41 mg/dL — ABNORMAL HIGH (ref 0.44–1.00)
GFR calc Af Amer: 23 mL/min — ABNORMAL LOW (ref >60)
GFR calc non Af Amer: 20 mL/min — ABNORMAL LOW (ref >60)
Glucose, Bld: 154 mg/dL — ABNORMAL HIGH (ref 70–99)
Phosphorus: 3.8 mg/dL (ref 2.5–4.6)
Potassium: 3.5 mmol/L (ref 3.5–5.1)
Sodium: 143 mmol/L (ref 135–145)

## 2019-07-28 MED ORDER — SODIUM CHLORIDE 0.9 % IV SOLN: INTRAVENOUS | Status: AC

## 2019-07-28 NOTE — TOC Progression Note (Signed)
Transition of Care [facility]) - Progression Note    Patient Details  Name: Skarlet Trochez MRN: [id number] Date of Birth: [birth year]  Transition of Care [facility]) CM/SW Contact  Shade Flood, LCSW Phone Number: 07/28/2019, 3:25 PM  Clinical Narrative:     Received call from [place] at [place] stating they have received authorization from pt's insurance. Updated MD. He states pt will likely be stable for dc tomorrow. Updated Debbie and she states that she can be contacted by weekend TOC at her cell number [phone] tomorrow to confirm pt's dc. Pt's sister is her guardian and will need to be contacted as well.   Weekend TOC will follow.  Expected Discharge Plan: [place] Barriers to Discharge: Continued Medical Work up  Expected Discharge Plan and Services Expected Discharge Plan: [place] In-house Referral: Clinical Social Work   Post Acute Care Choice: [place] Living arrangements for the past 2 months: Single Family Home Expected Discharge Date: 07/23/19                                     Social Determinants of Health (SDOH) Interventions    Readmission Risk Interventions Readmission Risk Prevention Plan 07/21/2019  Transportation Screening Complete  Medication Review (RN CM) Complete

## 2019-07-28 NOTE — Progress Notes (Signed)
[facility] Progress Note    Assessment/ Plan:   1. AKIon presumed CKD of unclear stage (from DM/HTN) presumably from hemodynamic alterations which have improved since admission.Unknown baseline Cr as she has received care in [place].  Renal US without hydro or masses. Her renal function has improved following volume resuscitation.  She is not a candidate for dialysis.  I recommend that we try to optimize her renal function as best we can here in the hospital with IVFs/ PO intake as much as possible.  She will be going to a SNF for at least the short term.  If she continues to refuse food and drink as an outpatient I think initiation of comfort/hospice measures would be appropriate at any time.  I greatly appreciate palliative care involvement and them following pt as an outpatient.  2.  3. AMS- CT with old strokes, nothing acute, same with MRI. Neurology following. Negative EEG.  On seroquel. 4. Anemia of CKD- no indication for ESA at this time 5. HTN- continue with meds 6. Paranoia and noncompliance- waxes and wanes 7. Disposition- pending SNF.  I'll sign off at this point.  Please call with any questions.  Given clear GOC, pt may follow up with our office prn.  Subjective:    Sitting in chair, drinking water and eating breakfast.  Accepted IVFs yesterday.  Cr slightly better this AM as well as UOP.  Palliative care greatly appreciated.     Objective:   BP (!) 165/59   Pulse 70   Temp 97.8 F (36.6 C) (Oral)   Resp 17   Ht 5\' 7"  (1.702 m)   Wt 105 kg   SpO2 96%   BMI 36.26 kg/m   Intake/Output Summary (Last 24 hours) at 07/28/2019 [id number] Last data filed at 07/28/2019 [id number] Gross per 24 hour  Intake 815.47 ml  Output 1400 ml  Net -584.53 ml   Weight change:   Physical Exam: Gen: NAD, sitting in chair, pleasant and talkative CVS: RRR Resp:clear Abd: soft Ext: no LE edema SKIN: poor turgor with mild tenting--> slightly improved  Imaging: No results  found.  Labs: BMET Recent Labs  Lab 07/22/19 1038 07/24/19 0559 07/25/19 0542 07/26/19 0555 07/27/19 0706 07/28/19 0533  NA 144 144 146* 149* 141 143  K 4.5 4.2 3.9 3.9 3.5 3.5  CL 109 109 112* 112* 108 109  CO2 28 24 23 23 22 23   GLUCOSE 163* 194* 237* 319* 216* 154*  BUN 51* 39* 43* 52* 65* 56*  CREATININE 2.91* 2.40* 2.23* 2.67* 2.82* 2.41*  CALCIUM 8.2* 8.7* 8.8* 9.0 8.5* 8.1*  PHOS  --   --   --  4.1 3.5 3.8   CBC Recent Labs  Lab 07/25/19 0542 07/26/19 0556 07/27/19 0706 07/28/19 0533  WBC 13.5* 19.1* 21.6* 15.2*  HGB 11.0* 11.3* 11.6* 10.9*  HCT 35.2* 35.7* 36.0 34.0*  MCV 87.1 86.4 85.5 85.6  PLT 225 273 280 251    Medications:    . aspirin EC  325 mg Oral q morning - 10a  . atorvastatin  40 mg Oral QHS  . bisacodyl  10 mg Rectal Once  . cloNIDine  0.2 mg Transdermal Q Sun  . doxycycline  100 mg Oral q morning - 10a  . enoxaparin (LOVENOX) injection  30 mg Subcutaneous Q24H  . insulin aspart  0-15 Units Subcutaneous TID WC  . insulin glargine  10 Units Subcutaneous QHS  . isosorbide mononitrate  30 mg Oral q morning -  10a  . linagliptin  5 mg Oral q morning - 10a  . loratadine  10 mg Oral q morning - 10a  . metoprolol  200 mg Oral q morning - 10a  . pantoprazole  40 mg Oral Daily  . polyethylene glycol  17 g Oral Daily  . QUEtiapine  25 mg Oral QHS      Madelon Lips, MD 07/28/2019, 9:52 AM

## 2019-07-28 NOTE — Care Management Important Message (Signed)
Important Message  Patient Details  Name: Victoria Winters MRN: [id number] Date of Birth: March 24, 1951   Medicare Important Message Given:  Yes     Tommy Medal 07/28/2019, 2:20 PM

## 2019-07-28 NOTE — Progress Notes (Signed)
Physical Therapy Treatment Patient Details Name: Victoria Winters MRN: [id number] DOB: 06-Dec-1951 Today's Date: 07/28/2019    History of Present Illness Patient is a 68 year old female admitted after a fall at home. Was found to have a nondisplaced left ankle fracture of the dorsal talus, proximal left fibular fracture and moderate diffuse encephalopathy with altered mental status.  PMH: CKD 4-5, brain aneurysm, DM, CVA, hallucinations, short term memory loss.    PT Comments     Patient willing to participate in therapy today but demonstrated increased fatigue with transitional mobility and exercises. Increased resting tremor/shaking noted today compared to previous visits. Patient was too fatigued to walk > 5 feet today. Attempted sit to stands for LE strengthening but after 3 patient was too fatigued to perform more.  Patient will continue to benefit from skilled physical therapy in hospital and recommended venue below to increase strength, balance, endurance for safe ADLs and gait.   Follow Up Recommendations  SNF;Supervision/Assistance - 24 hour;Supervision for mobility/OOB     Equipment Recommendations  None recommended by PT    Recommendations for Other Services       Precautions / Restrictions Precautions Precautions: Fall Restrictions Weight Bearing Restrictions: No LLE Weight Bearing: Weight bearing as tolerated    Mobility  Bed Mobility Overal bed mobility: Modified Independent Bed Mobility: Supine to Sit     Supine to sit: Modified independent (Device/Increase time)     General bed mobility comments: slightly labored movement, increased time  Transfers Overall transfer level: Needs assistance Equipment used: Rolling walker (2 wheeled) Transfers: Sit to/from Stand Sit to Stand: Supervision Stand pivot transfers: Supervision       General transfer comment: requires some verbal/tactile cueing for redirection to tasks  Ambulation/Gait Ambulation/Gait assistance:  Supervision Gait Distance (Feet): 5 Feet Assistive device: Rolling walker (2 wheeled) Gait Pattern/deviations: Decreased step length - right;Decreased step length - left;Decreased stride length Gait velocity: decreased   General Gait Details: slightly labored cadence limited mostly due to c/o fatigue and weakness   Stairs             Wheelchair Mobility    Modified Rankin (Stroke Patients Only)       Balance Overall balance assessment: Needs assistance Sitting-balance support: Feet supported;No upper extremity supported Sitting balance-Leahy Scale: Fair Sitting balance - Comments: fair/good seated at bedside   Standing balance support: During functional activity;Bilateral upper extremity supported Standing balance-Leahy Scale: Fair Standing balance comment: using RW                            Cognition Arousal/Alertness: Awake/alert Behavior During Therapy: Flat affect Overall Cognitive Status: No family/caregiver present to determine baseline cognitive functioning Area of Impairment: Problem solving                             Problem Solving: Slow processing General Comments: Improved attention and willingness to participate noted today.      Exercises General Exercises - Lower Extremity Long Arc Quad: AROM;Seated;Strengthening;Both;15 reps Hip Flexion/Marching: AROM;Seated;Strengthening;Both;15 reps Toe Raises: AROM;Seated;Strengthening;Both;10 reps Heel Raises: AROM;Seated;Strengthening;Both;10 reps Other Exercises Other Exercises: sit to stands from recliner -ues of UE to assist x3    General Comments        Pertinent Vitals/Pain Pain Assessment: Faces Faces Pain Scale: Hurts a little bit Pain Location: in her chest/trunk area Pain Descriptors / Indicators: Discomfort    Home Living  Prior Function            PT Goals (current goals can now be found in the care plan section) Acute Rehab PT  Goals Patient Stated Goal: to go home PT Goal Formulation: With patient Time For Goal Achievement: 08/05/19    Frequency    Min 3X/week      PT Plan Current plan remains appropriate    Co-evaluation              AM-PAC PT "6 Clicks" Mobility   Outcome Measure  Help needed turning from your back to your side while in a flat bed without using bedrails?: A Little Help needed moving from lying on your back to sitting on the side of a flat bed without using bedrails?: A Little Help needed moving to and from a bed to a chair (including a wheelchair)?: A Little Help needed standing up from a chair using your arms (e.g., wheelchair or bedside chair)?: A Little Help needed to walk in hospital room?: A Little Help needed climbing 3-5 steps with a railing? : A Lot 6 Click Score: 17    End of Session   Activity Tolerance: Patient tolerated treatment well;Patient limited by fatigue Patient left: in chair;with call bell/phone within reach;with chair alarm set Nurse Communication: Mobility status PT Visit Diagnosis: Unsteadiness on feet (R26.81);Other abnormalities of gait and mobility (R26.89);Muscle weakness (generalized) (M62.81)     Time: [id number] PT Time Calculation (min) (ACUTE ONLY): 19 min  Charges:  $Therapeutic Activity: 8-22 mins                     9:04 AM, 07/28/19 Jerene Pitch, DPT Physical Therapy with [facility]  [phone] office

## 2019-07-28 NOTE — Progress Notes (Signed)
PROGRESS NOTE    Victoria Winters  [id number] DOB: [birth year] DOA: 07/20/2019 PCP: [facility]   Brief Narrative:  Per HPI: Victoria Winters a 68 y.o.femalewith medical history significant forCVA with residual deficits, diabetes mellitus, brain aneurysms, hallucinations, frequent UTIs, CKD 4-5. History is obtained with the help of sister-Victoria Winters who is healthcare power of attorney.The patient is awake and alert, has short-term memory loss due to her stroke and is unable to give me history,but answers simple questions.Patient lives with her sister. Over the past 4 days,patient sister reports increased shakiness, patient sometimes not respondingto questions, not as lucid as previously or as talkative.Patient also reported blurred pain with urination 4 days ago.  Yesterday while patient was using her walker and trying to go up the ramp into the house, patient became tremulous and fell. Since fall patient has not been able to ambulate. At baseline patient ambulates with walker and sometimes wheelchair. No vomiting, no loose stools, patient admitted good p.o. intake,no difficulty breathing, no cough no chest pain,no fever no chills.. Sister reports wound to patient's buttock area.  Patient sister also reports patient has an appointment(1st)to see a nephrologist but because ofCoVID, herappointment dateand change several times and is now in September.  Patient was admitted with what appears to be acute on chronic metabolic encephalopathy and associated fall that has resulted in a proximal fibula fracture on her left knee. Orthopedics recommends weightbearing and checking ankle films to ensure that there is no high ankle sprain present.    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   Brain aneurysm   DM (diabetes mellitus) (HCC)   Bilateral fibular fractures   CVA (cerebral vascular  accident) ([place])   Hallucination   Acute encephalopathy   Palliative care by specialist   Goals of care, counseling/discussion   History of CVA (cerebrovascular accident)   Acute on chronic metabolic encephalopathy-multifactorial with mild cognitive impairment at baseline -CT head without any acute abnormalities and brain MRI with no acute abnormalities noted -UA not suggestive of infection -TSH noted to be 1.88 and ammonia 31 -Appreciateongoing nephrology evaluation with creatinine clearance pending, but no current suspicion for uremic encephalopathy-not a candidate for hemodialysis -EEG with no seizure activity noted -Possible delirium component on top of prior memory loss from CVA. -Appreciate further evaluation as ordered by neurology with labs and aspirin increased to 325 mg with discontinuation of Plavix -Appreciate palliative care evaluation and change in CODE STATUS -Overall mental status appears to be improving with Seroquel.  Mechanical fall with subsequent proximal fibula fracture on left side -Spoke with orthopedist Dr. Tresa Moore who recommends weightbearing as tolerated and left ankle imaging to assess for high ankle fractureon 8/14 -Discussed again on 8/15 with Dr. Stann Mainland regarding nondisplaced dorsal talus fracture on ankle film. He has recommended weightbearing and ambulation as tolerated as this is likely secondary to ankle sprain -PTassessment appreciated with recommendations for SNF on discharge  CKD stage IV-Vwith hyperkalemiaand elevated BUN-improved -Patient has received some IV fluid with home Lasix held -She appears to have had1400cc of urine output over last 24 hours --Renal ultrasound with no findings noted -She was started on IV fluids overnight with some improvement of renal function.  Continue IV fluids for now  Type 2 diabetes-with hyperglycemia -Patient intermittently refusing her medications -Glucose levelsare improved and will maintain on  Lantus 10 units daily for now and increase as needed. -Continue home Tradjenta -Continue SSI  coverage  History of CVA/brain aneurysm/hallucinations -Patient does have short-term memory loss from prior stroke as well as intermittent hallucinations -Started on Seroquel which appears to be helping her mental status.  She is now calm, pleasant and cooperative current treatments -Continue prior statin, and high-dose aspirin for now -Appreciate neurology evaluation of any organic etiology  Hypertension- mildly elevated -Continue home Imdur, metoprolol, and clonidine -Patient has just recently restarted taking her medicines.  We will continue to follow blood pressure.  Leukocytosis -Etiology is unclear, she does not have any signs of sepsis -It is concerning that her WBC count continues to trend up -She has been afebrile -Possibly related to hemoconcentration from dehydration -Patient received trial of IV fluids and WBC count is trending down.   DVT prophylaxis:SCDs Code Status: DNR/DNI Family Communication:Discussed with sister at bedside on 8/19 Disposition Plan:Likely discharge to skilled nursing facility in a.m. if creatinine continues to improve..   Consultants:  Orthopedics on phone  Nephrology  Neurology  Palliative care  Procedures:  As above with EEG on 8/14 with no seizure activity  Antimicrobials:   None   Subjective: Reports that her p.o. intake is been poor.  She has been encouraged to eat and drink.  She is having bowel movements.  Denies any shortness of breath.  No nausea at this time.  Objective: Vitals:   07/28/19 0631 07/28/19 0806 07/28/19 0807 07/28/19 1942  BP: (!) 165/67 (!) 182/85 (!) 165/59   Pulse: 72 71 70   Resp:      Temp:      TempSrc:      SpO2:    96%  Weight:      Height:        Intake/Output Summary (Last 24 hours) at 07/28/2019 1952 Last data filed at 07/28/2019 1700 Gross per 24 hour  Intake 575.47 ml  Output  500 ml  Net 75.47 ml   Filed Weights   07/20/19 2106 07/21/19 0100  Weight: 105 kg 105 kg    Examination:  General exam: Alert, awake, no distress Respiratory system: Clear to auscultation. Respiratory effort normal. Cardiovascular system:RRR. No murmurs, rubs, gallops. Gastrointestinal system: Abdomen is nondistended, soft and nontender. No organomegaly or masses felt. Normal bowel sounds heard. Central nervous system:  No focal neurological deficits. Extremities: No C/C/E, +pedal pulses Skin: No rashes, lesions or ulcers Psychiatry: Confused, pleasant.     Data Reviewed: I have personally reviewed following labs and imaging studies  CBC: Recent Labs  Lab 07/22/19 1038 07/25/19 0542 07/26/19 0556 07/27/19 0706 07/28/19 0533  WBC 9.8 13.5* 19.1* 21.6* 15.2*  HGB 9.8* 11.0* 11.3* 11.6* 10.9*  HCT 31.9* 35.2* 35.7* 36.0 34.0*  MCV 87.9 87.1 86.4 85.5 85.6  PLT 167 225 273 280 [id number]   Basic Metabolic Panel: Recent Labs  Lab 07/24/19 0559 07/25/19 0542 07/26/19 0555 07/27/19 0706 07/28/19 0533  NA 144 146* 149* 141 143  K 4.2 3.9 3.9 3.5 3.5  CL 109 112* 112* 108 109  CO2 24 23 23 22 23   GLUCOSE 194* 237* 319* 216* 154*  BUN 39* 43* 52* 65* 56*  CREATININE 2.40* 2.23* 2.67* 2.82* 2.41*  CALCIUM 8.7* 8.8* 9.0 8.5* 8.1*  PHOS  --   --  4.1 3.5 3.8   GFR: Estimated Creatinine Clearance: 27.9 mL/min (A) (by C-G formula based on SCr of 2.41 mg/dL (H)). Liver Function Tests: Recent Labs  Lab 07/26/19 0555 07/27/19 0706 07/28/19 0533  ALBUMIN 3.4* 3.3* 3.1*   No results for input(s):  LIPASE, AMYLASE in the last 168 hours. No results for input(s): AMMONIA in the last 168 hours. Coagulation Profile: No results for input(s): INR, PROTIME in the last 168 hours. Cardiac Enzymes: No results for input(s): CKTOTAL, CKMB, CKMBINDEX, TROPONINI in the last 168 hours. BNP (last 3 results) No results for input(s): PROBNP in the last 8760 hours. HbA1C: No results for  input(s): HGBA1C in the last 72 hours. CBG: Recent Labs  Lab 07/27/19 1615 07/27/19 2049 07/28/19 0800 07/28/19 1125 07/28/19 1703  GLUCAP 181* 137* 152* 163* 110*   Lipid Profile: No results for input(s): CHOL, HDL, LDLCALC, TRIG, CHOLHDL, LDLDIRECT in the last 72 hours. Thyroid Function Tests: No results for input(s): TSH, T4TOTAL, FREET4, T3FREE, THYROIDAB in the last 72 hours. Anemia Panel: Recent Labs    07/26/19 0555  VITAMINB12 1,040*   Sepsis Labs: No results for input(s): PROCALCITON, LATICACIDVEN in the last 168 hours.  Recent Results (from the past 240 hour(s))  SARS Coronavirus 2 [facility] order, Performed in [facility] hospital lab) Nasopharyngeal Nasopharyngeal Swab     Status: None   Collection Time: 07/20/19  5:53 PM   Specimen: Nasopharyngeal Swab  Result Value Ref Range Status   SARS Coronavirus 2 NEGATIVE NEGATIVE Final    Comment: (NOTE) If result is NEGATIVE SARS-CoV-2 target nucleic acids are NOT DETECTED. The SARS-CoV-2 RNA is generally detectable in upper and lower  respiratory specimens during the acute phase of infection. The lowest  concentration of SARS-CoV-2 viral copies this assay can detect is 250  copies / mL. A negative result does not preclude SARS-CoV-2 infection  and should not be used as the sole basis for treatment or other  patient management decisions.  A negative result may occur with  improper specimen collection / handling, submission of specimen other  than nasopharyngeal swab, presence of viral mutation(s) within the  areas targeted by this assay, and inadequate number of viral copies  (<250 copies / mL). A negative result must be combined with clinical  observations, patient history, and epidemiological information. If result is POSITIVE SARS-CoV-2 target nucleic acids are DETECTED. The SARS-CoV-2 RNA is generally detectable in upper and lower  respiratory specimens dur ing the acute phase of infection.  Positive  results  are indicative of active infection with SARS-CoV-2.  Clinical  correlation with patient history and other diagnostic information is  necessary to determine patient infection status.  Positive results do  not rule out bacterial infection or co-infection with other viruses. If result is PRESUMPTIVE POSTIVE SARS-CoV-2 nucleic acids MAY BE PRESENT.   A presumptive positive result was obtained on the submitted specimen  and confirmed on repeat testing.  While 2019 novel coronavirus  (SARS-CoV-2) nucleic acids may be present in the submitted sample  additional confirmatory testing may be necessary for epidemiological  and / or clinical management purposes  to differentiate between  SARS-CoV-2 and other Sarbecovirus currently known to infect humans.  If clinically indicated additional testing with an alternate test  methodology [phone]) is advised. The SARS-CoV-2 RNA is generally  detectable in upper and lower respiratory sp ecimens during the acute  phase of infection. The expected result is Negative. Fact Sheet for Patients:  StrictlyIdeas.no Fact Sheet for Healthcare Providers: BankingDealers.co.za This test is not yet approved or cleared by the [place] FDA and has been authorized for detection and/or diagnosis of SARS-CoV-2 by FDA under an Emergency Use Authorization (EUA).  This EUA will remain in effect (meaning this test can be used) for the  duration of the COVID-19 declaration under Section 564(b)(1) of the Act, 21 U.S.C. section 360bbb-3(b)(1), unless the authorization is terminated or revoked sooner. Performed at [facility], [address], [place], [place] [address]          Radiology Studies: No results found.      Scheduled Meds: . aspirin EC  325 mg Oral q morning - 10a  . atorvastatin  40 mg Oral QHS  . bisacodyl  10 mg Rectal Once  . cloNIDine  0.2 mg Transdermal Q Sun  . doxycycline  100 mg Oral q morning  - 10a  . enoxaparin (LOVENOX) injection  30 mg Subcutaneous Q24H  . insulin aspart  0-15 Units Subcutaneous TID WC  . insulin glargine  10 Units Subcutaneous QHS  . isosorbide mononitrate  30 mg Oral q morning - 10a  . linagliptin  5 mg Oral q morning - 10a  . loratadine  10 mg Oral q morning - 10a  . metoprolol  200 mg Oral q morning - 10a  . pantoprazole  40 mg Oral Daily  . polyethylene glycol  17 g Oral Daily  . QUEtiapine  25 mg Oral QHS   Continuous Infusions:   LOS: 7 days    Time spent: 30 minutes    Kathie Dike, MD [facility]   If 7PM-7AM, please contact night-coverage [email or web address]  07/28/2019, 7:52 PM

## 2019-07-28 NOTE — TOC Progression Note (Signed)
Transition of Care [facility]) - Progression Note    Patient Details  Name: Victoria Winters MRN: [id number] Date of Birth: 16-Jun-1951  Transition of Care [facility]) CM/SW Contact  Shade Flood, LCSW Phone Number: 07/28/2019, 12:22 PM  Clinical Narrative:     TOC following. Pt status discussed with MD in Progression this AM. Spoke with pt's sister/guardian to update on SNF bed offers. She requests [facility]. Updated Debbie at [facility] and asked her to please start insurance authorization. Pt cannot transfer until the authorization is complete.   Will follow.  Expected Discharge Plan: [place] Barriers to Discharge: Continued Medical Work up  Expected Discharge Plan and Services Expected Discharge Plan: [place] In-house Referral: Clinical Social Work   Post Acute Care Choice: [place] Living arrangements for the past 2 months: Single Family Home Expected Discharge Date: 07/23/19                                     Social Determinants of Health (SDOH) Interventions    Readmission Risk Interventions Readmission Risk Prevention Plan 07/21/2019  Transportation Screening Complete  Medication Review (RN CM) Complete

## 2019-07-29 LAB — CBC
HCT: 32.5 % — ABNORMAL LOW (ref 36.0–46.0)
Hemoglobin: 10.4 g/dL — ABNORMAL LOW (ref 12.0–15.0)
MCH: 27.7 pg (ref 26.0–34.0)
MCHC: 32 g/dL (ref 30.0–36.0)
MCV: 86.4 fL (ref 80.0–100.0)
Platelets: 229 10*3/uL (ref 150–400)
RBC: 3.76 MIL/uL — ABNORMAL LOW (ref 3.87–5.11)
RDW: 13.4 % (ref 11.5–15.5)
WBC: 14.5 10*3/uL — ABNORMAL HIGH (ref 4.0–10.5)
nRBC: 0 % (ref 0.0–0.2)

## 2019-07-29 LAB — RENAL FUNCTION PANEL
Albumin: 2.8 g/dL — ABNORMAL LOW (ref 3.5–5.0)
Anion gap: 7 (ref 5–15)
BUN: 45 mg/dL — ABNORMAL HIGH (ref 8–23)
CO2: 23 mmol/L (ref 22–32)
Calcium: 7.9 mg/dL — ABNORMAL LOW (ref 8.9–10.3)
Chloride: 111 mmol/L (ref 98–111)
Creatinine, Ser: 2 mg/dL — ABNORMAL HIGH (ref 0.44–1.00)
GFR calc Af Amer: 29 mL/min — ABNORMAL LOW (ref >60)
GFR calc non Af Amer: 25 mL/min — ABNORMAL LOW (ref >60)
Glucose, Bld: 133 mg/dL — ABNORMAL HIGH (ref 70–99)
Phosphorus: 3.5 mg/dL (ref 2.5–4.6)
Potassium: 3.4 mmol/L — ABNORMAL LOW (ref 3.5–5.1)
Sodium: 141 mmol/L (ref 135–145)

## 2019-07-29 LAB — GLUCOSE, CAPILLARY
Glucose-Capillary: 139 mg/dL — ABNORMAL HIGH (ref 70–99)
Glucose-Capillary: 128 mg/dL — ABNORMAL HIGH (ref 70–99)

## 2019-07-29 MED ORDER — QUETIAPINE FUMARATE 25 MG PO TABS: 25.0000 mg | ORAL_TABLET | Freq: Every day | ORAL | Status: AC

## 2019-07-29 MED ORDER — SODIUM CHLORIDE 0.9 % IV SOLN: INTRAVENOUS | Status: DC

## 2019-07-29 MED ORDER — ASPIRIN EC 325 MG PO TBEC: 325.0000 mg | DELAYED_RELEASE_TABLET | Freq: Every morning | ORAL | Status: DC

## 2019-07-29 MED ORDER — INSULIN GLARGINE 100 UNIT/ML ~~LOC~~ SOLN: 10.0000 [IU] | Freq: Every day | SUBCUTANEOUS | 11 refills | Status: DC

## 2019-07-29 NOTE — Progress Notes (Signed)
Patient to discharge to [facility] today. Called report to [place], receiving nurse at [facility] [employer]. States no visitors at this time. Spoke with patient's sister Willeen Cass who is also her legal guardian/POA. Will make sure she is aware of visitor restrictions at facility as well. Patient aware of discharge plan. Awaiting EMS transport for discharge. Discharge packet to be sent with patient at discharge. Donavan Foil, RN

## 2019-07-29 NOTE — Progress Notes (Signed)
Patient refused morning medications ordered. Notified Dr. Roderic Palau, stated okay. Donavan Foil, RN

## 2019-07-29 NOTE — Progress Notes (Signed)
Patient left floor via stretcher accompanied by EMS transport for discharge to [facility]. Sister Willeen Cass was present at bedside at time of discharge. Discharge packet sent with patient. Donavan Foil, RN

## 2019-07-29 NOTE — Discharge Summary (Signed)
Physician Discharge Summary  Victoria Winters [id number] DOB: Oct 04, 1951 DOA: 07/20/2019  PCP: [facility] date: 07/20/2019 Discharge date: 07/29/2019  Admitted From: Home Disposition: Skilled nursing facility  Recommendations for Outpatient Follow-up:  1. Follow up with PCP in 1-2 weeks 2. Please obtain BMP/CBC in one week 3. Please follow up on the following pending results:  Discharge Condition: Stable CODE STATUS: DNR Diet recommendation: Heart healthy, carb modified  Brief/Interim Summary: Per HPI: Victoria Winters a 68 y.o.femalewith medical history significant forCVA with residual deficits, diabetes mellitus, brain aneurysms, hallucinations, frequent UTIs, CKD 4-5. History is obtained with the help of sister-Victoria Winters who is healthcare power of attorney.The patient is awake and alert, has short-term memory loss due to her stroke and is unable to give me history,but answers simple questions.Patient lives with her sister. Over the past 4 days,patient sister reports increased shakiness, patient sometimes not respondingto questions, not as lucid as previously or as talkative.Patient also reported blurred pain with urination 4 days ago.  Yesterday while patient was using her walker and trying to go up the ramp into the house, patient became tremulous and fell. Since fall patient has not been able to ambulate. At baseline patient ambulates with walker and sometimes wheelchair. No vomiting, no loose stools, patient admitted good p.o. intake,no difficulty breathing, no cough no chest pain,no fever no chills.. Sister reports wound to patient's buttock area.  Patient sister also reports patient has an appointment(1st)to see a nephrologist but because ofCoVID, herappointment dateand change several times and is now in September.  Patient was admitted with what appears to be acute on chronic metabolic encephalopathy and associated fall that has  resulted in a proximal fibula fracture on her left knee. Orthopedics recommends weightbearing and checking ankle films to ensure that there is no high ankle sprain present.  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   Brain aneurysm   DM (diabetes mellitus) (HCC)   Bilateral fibular fractures   CVA (cerebral vascular accident) ([place])   Hallucination   Acute encephalopathy   Palliative care by specialist   Goals of care, counseling/discussion   History of CVA (cerebrovascular accident)  Acute on chronic metabolic encephalopathy-multifactorial with mild cognitive impairment at baseline -CT head without any acute abnormalities and brain MRI with no acute abnormalities noted -UA not suggestive of infection -TSH noted to be 1.88 and ammonia 31 -EEG with no seizure activity noted -Possible delirium component on top of prior memory loss from CVA. -Appreciate neurology recommendations with labs and aspirin increased to 325 mg with discontinuation of Plavix -Appreciate palliative care evaluation and change in CODE STATUS -She was started on Seroquel nightly and overall mental status appears to have improved  Mechanical fall with subsequent proximal fibula fracture on left side -Spoke with orthopedist Dr. Marcelino Scot who recommends weightbearing as tolerated and left ankle imaging to assess for high ankle fractureon 8/14 -Discussed again on 8/15 with Dr. Stann Mainland regarding nondisplaced dorsal talus fracture on ankle film. He has recommended weightbearing and ambulation as tolerated as this is likely secondary to ankle sprain -PTassessment appreciated with recommendations for SNF on discharge  CKD stage IV-Vwith hyperkalemiaand elevated BUN-improved -Patient has received some IV fluid with home Lasix held -She appears to have had1400cc of urine output over last 24 hours --Renal ultrasound with no findings noted -She was started on  IV fluids with improvement of renal function. -We will continue to hold Lasix for  now. -She was followed by nephrology in the hospital and was not felt to be a candidate for dialysis. -She may follow-up with outpatient nephrology PRN.  Type 2 diabetes-with hyperglycemia -Patient had been intermittently refusing her medications -Glucose levelsare improved and will maintain on Lantus 10 units daily for now and increase as needed. -Continue home Tradjenta -Continue SSI coverage  History of CVA/brain aneurysm/hallucinations -Patient does have short-term memory loss from prior stroke as well as intermittent hallucinations -Started on Seroquel which appears to be helping her mental status.  She is now calm, pleasant and cooperative current treatments -Continue prior statin, and high-dose aspirin for now -Appreciate neurology evaluation of any organic etiology  Hypertension-mildly elevated -Continue home Imdur, metoprolol, and clonidine and hydralazine  Leukocytosis -Related to hemoconcentration -Improving with IV fluids  Discharge Instructions  Discharge Instructions    Diet - low sodium heart healthy   Complete by: As directed    Increase activity slowly   Complete by: As directed      Allergies as of 07/29/2019      Reactions   Penicillins Anaphylaxis   Did it involve swelling of the face/tongue/throat, SOB, or low BP? Yes Did it involve sudden or severe rash/hives, skin peeling, or any reaction on the inside of your mouth or nose? Unknown Did you need to seek medical attention at a hospital or doctor's office? Unknown When did it last happen?Within past 10 years If all above answers are "NO", may proceed with cephalosporin use.   Chicken Allergy    Eggs Or Egg-derived Products Swelling   Milk-related Compounds    Other    Black beans, cats, dogs, grass clippings   Wheat Bran       Medication List    STOP taking these medications   clopidogrel 75 MG  tablet Commonly known as: PLAVIX   doxycycline 100 MG tablet Commonly known as: VIBRA-TABS   furosemide 40 MG tablet Commonly known as: LASIX   pregabalin 75 MG capsule Commonly known as: LYRICA     TAKE these medications   acetaminophen-codeine 300-60 MG tablet Commonly known as: TYLENOL #4 Take 1 tablet by mouth every 4 (four) hours as needed for moderate pain.   aspirin EC 325 MG tablet Take 1 tablet (325 mg total) by mouth every morning. What changed:   medication strength  how much to take   atorvastatin 40 MG tablet Commonly known as: LIPITOR Take 40 mg by mouth at bedtime.   cloNIDine 0.2 mg/24hr patch Commonly known as: CATAPRES - Dosed in mg/24 hr Place 0.2 mg onto the skin every Sunday.   Cranberry Plus Vitamin C 4200-20-3 MG-MG-UNIT Caps Generic drug: Cranberry-Vitamin C-Vitamin E Take 1 capsule by mouth daily as needed (urinary tract health).   escitalopram 10 MG tablet Commonly known as: LEXAPRO Take 10 mg by mouth every morning.   hydrALAZINE 25 MG tablet Commonly known as: APRESOLINE Take 25 mg by mouth 3 (three) times daily.   insulin glargine 100 UNIT/ML injection Commonly known as: LANTUS Inject 0.1 mLs (10 Units total) into the skin at bedtime. What changed: how much to take   insulin lispro 100 UNIT/ML injection Commonly known as: HUMALOG Inject 1-17 Units into the skin 3 (three) times daily before meals. Sliding scale starting at; 180-220= 4 units 221-260= 6 units 261-300= 8 units 301-340=10units 341-380=12units   isosorbide mononitrate 30 MG 24 hr tablet Commonly known as: IMDUR Take 30 mg by mouth every morning.   linagliptin 5 MG Tabs tablet Commonly  known as: TRADJENTA Take 5 mg by mouth every morning.   loratadine 10 MG tablet Commonly known as: CLARITIN Take 10 mg by mouth every morning.   Melatonin 1 MG Tabs Take 1 mg by mouth at bedtime.   metoprolol 200 MG 24 hr tablet Commonly known as: TOPROL-XL Take 200 mg  by mouth every morning.   multivitamin with minerals Tabs tablet Take 1 tablet by mouth daily.   omeprazole 20 MG capsule Commonly known as: PRILOSEC Take 20 mg by mouth every morning.   QUEtiapine 25 MG tablet Commonly known as: SEROQUEL Take 1 tablet (25 mg total) by mouth at bedtime.            Durable Medical Equipment  (From admission, onward)         Start     Ordered   07/26/19 1506  For home use only DME Hospital bed  Once    Question Answer Comment  Length of Need Lifetime   Patient has (list medical condition): Multiple PE   The above medical condition requires: Patient requires the ability to reposition frequently   Head must be elevated greater than: 30 degrees   Bed type Semi-electric      07/26/19 1506   07/26/19 1505  For home use only DME 3 n 1  Once     07/26/19 1505         Contact information for after-discharge care    [facility] .   Service: Skilled Nursing Contact information: [place] [phone]             Allergies  Allergen Reactions  . Penicillins Anaphylaxis    Did it involve swelling of the face/tongue/throat, SOB, or low BP? Yes Did it involve sudden or severe rash/hives, skin peeling, or any reaction on the inside of your mouth or nose? Unknown Did you need to seek medical attention at a hospital or doctor's office? Unknown When did it last happen?Within past 10 years If all above answers are "NO", may proceed with cephalosporin use.   . Chicken Allergy   . Eggs Or Egg-Derived Products Swelling  . Milk-Related Compounds   . Other     Black beans, cats, dogs, grass clippings  . Wheat Bran     Consultations:  Nephrology  Palliative medicine  Neurology  Orthopedics on:   Procedures/Studies: Dg Pelvis 1-2 Views  Result Date: 07/20/2019 CLINICAL DATA:  68 year old female status post fall with pain. EXAM: PELVIS - 1-2 VIEW  COMPARISON:  None. FINDINGS: Femoral heads are normally located. Mild asymmetric right hip joint space loss. Grossly intact proximal femurs. Upper pelvis bone detail degraded by soft tissue artifact. No pelvis fracture identified. No acute osseous abnormality identified. Visible bowel gas pattern is within normal limits. IMPRESSION: No acute fracture or dislocation identified about the pelvis. Electronically Signed   By: Genevie Ann M.D.   On: 07/20/2019 19:11   Dg Tibia/fibula Left  Result Date: 07/20/2019 CLINICAL DATA:  68 year old female with fall and trauma to the left lower extremity. EXAM: LEFT TIBIA AND FIBULA - 2 VIEW COMPARISON:  Left knee radiograph dated 07/20/2019 FINDINGS: There is a nondisplaced oblique fracture of the proximal fibular diaphysis. No other acute fracture identified. The bones are osteopenic. There is no dislocation. The ankle mortise is intact. There is diffuse subcutaneous edema. IMPRESSION: Nondisplaced oblique fracture of the proximal fibular diaphysis. Electronically Signed   By: Laren Everts.D.  On: 07/20/2019 20:52   Dg Tibia/fibula Right  Result Date: 07/20/2019 CLINICAL DATA:  Fall with fracture in tibia/fibula. EXAM: RIGHT TIBIA AND FIBULA - 2 VIEW COMPARISON:  Knee films of earlier. FINDINGS: Degenerative changes about the lateral aspect of the knee are relatively mild. The irregularity of the fibular neck is less apparent than on dedicated knee radiographs. IMPRESSION: No definite acute osseous abnormality. Correlate with point tenderness, given appearance of the fibular neck on dedicated knee films. Electronically Signed   By: Abigail Miyamoto M.D.   On: 07/20/2019 20:53   Dg Ankle Complete Left  Result Date: 07/21/2019 CLINICAL DATA:  Recent fall, pain and swelling EXAM: LEFT ANKLE COMPLETE - 3+ VIEW COMPARISON:  07/21/2019 FINDINGS: Bones are osteopenic. Mild diffuse soft tissue swelling. Distal tibia and fibula appear intact. On the lateral view, there is  cortical irregularity and fragmentation of the dorsal talus cortical surface suspicious for fracture. No ankle malalignment. IMPRESSION: Findings suspicious for nondisplaced dorsal talus fracture on the lateral view. Osteopenia Diffuse soft tissue swelling No malalignment Electronically Signed   By: Jerilynn Mages.  Shick M.D.   On: 07/21/2019 14:42   Ct Head Wo Contrast  Result Date: 07/20/2019 CLINICAL DATA:  Ataxia, fall, history of strokes EXAM: CT HEAD WITHOUT CONTRAST TECHNIQUE: Contiguous axial images were obtained from the base of the skull through the vertex without intravenous contrast. COMPARISON:  None. FINDINGS: Brain: Focus of gliosis in the left cerebellar hemisphere likely reflecting remote infarct no evidence of acute infarction, hemorrhage, hydrocephalus, extra-axial collection or mass lesion/mass effect. Symmetric prominence of the ventricles, cisterns and sulci compatible with parenchymal volume loss. Patchy areas of white matter hypoattenuation are most compatible with chronic microvascular angiopathy. Vascular: Atherosclerotic calcification of the carotid siphons. Skull: Hyperostosis frontalis interna. No calvarial fracture or suspicious osseous lesion. No scalp swelling or hematoma. Sinuses/Orbits: Mural thickening in the right frontal sinus and anterior right ethmoid air cells. Orbital structures are unremarkable aside from prior lens extractions. Other: None. IMPRESSION: No acute intracranial abnormality. Region of gliosis in the left cerebellar hemisphere, likely remote infarct. Volume loss and chronic white matter changes. Electronically Signed   By: Lovena Le M.D.   On: 07/20/2019 16:31   Mr Brain Wo Contrast  Result Date: 07/21/2019 CLINICAL DATA:  Altered level of consciousness. History of brain aneurysm. Memory loss. EXAM: MRI HEAD WITHOUT CONTRAST TECHNIQUE: Multiplanar, multiecho pulse sequences of the brain and surrounding structures were obtained without intravenous contrast.  COMPARISON:  Head CT yesterday. FINDINGS: Brain: Diffusion imaging does not show any acute or subacute infarction. There are old ischemic changes of the pons. No cerebellar abnormality. Cerebral hemispheres show old infarction in the left basal ganglia and internal capsule and in the right basal ganglia external capsule. Chronic small-vessel changes affect the thalami in the hemispheric white matter. No large vessel territory infarction. Hemosiderin deposition associated with the old basal ganglia infarctions. No sign of recent hemorrhage, hydrocephalus or extra-axial collection. Vascular: Major vessels at the base of the brain show flow. Skull and upper cervical spine: Negative Sinuses/Orbits: Inflammatory changes of the right frontal and ethmoid sinuses. Other sinuses clear. Orbits negative. Other: None IMPRESSION: No acute or reversible finding. Old ischemic changes affecting the pons. Old infarctions in the basal ganglia and radiating white matter tracts, with hemosiderin deposition. Electronically Signed   By: Nelson Chimes M.D.   On: 07/21/2019 12:31   US Renal  Result Date: 07/23/2019 CLINICAL DATA:  Chronic kidney disease. Elevated creatinine for 2 days. EXAM: RENAL /  URINARY TRACT ULTRASOUND COMPLETE COMPARISON:  None. FINDINGS: Right Kidney: Renal measurements: 10.9 x 5.2 x 6.0 cm = volume: 178 mL . Echogenicity within normal limits. No mass or hydronephrosis visualized. Left Kidney: Renal measurements: 12.3 x 6.5 x 5.9 cm. = volume: 244 mL. Echogenicity within normal limits. No mass or hydronephrosis visualized. Bladder: Appears normal for degree of bladder distention. Ureteral jets not visualized. IMPRESSION: 1. No mass or hydronephrosis identified. Electronically Signed   By: Kerby Moors M.D.   On: 07/23/2019 10:53   Dg Chest Port 1 View  Result Date: 07/20/2019 CLINICAL DATA:  Diabetes, smoker EXAM: PORTABLE CHEST 1 VIEW COMPARISON:  None. FINDINGS: Cardiomegaly with vascular congestion. No  focal consolidation or effusion. No pneumothorax. Patchy sclerosis proximal left humerus, possible infarct or chondral lesion. IMPRESSION: Cardiomegaly with vascular congestion Electronically Signed   By: Donavan Foil M.D.   On: 07/20/2019 19:54   Dg Knee Complete 4 Views Left  Result Date: 07/20/2019 CLINICAL DATA:  Fall with knee pain EXAM: LEFT KNEE - COMPLETE 4+ VIEW COMPARISON:  None. FINDINGS: Mild degenerative change of the medial joint space. Mild patellofemoral degenerative change. Small knee effusion. Acute minimally displaced fracture involving the proximal shaft of the fibula. IMPRESSION: 1. Acute minimally displaced fracture proximal shaft of fibula 2. Mild degenerative changes of the knee.  Small knee effusion Electronically Signed   By: Donavan Foil M.D.   On: 07/20/2019 15:23   Dg Knee Complete 4 Views Right  Result Date: 07/20/2019 CLINICAL DATA:  Fall EXAM: RIGHT KNEE - COMPLETE 4+ VIEW COMPARISON:  None. FINDINGS: Possible acute, subtle slightly impacted fracture at the fibular neck. No dislocation. Mild patellofemoral and lateral joint space degenerative change. Trace knee effusion IMPRESSION: 1. Findings questionable for slightly impacted fracture at the fibular neck 2. Mild degenerative changes with trace knee effusion Electronically Signed   By: Donavan Foil M.D.   On: 07/20/2019 15:22       Subjective: Patient is woken up from sleeping.  Denies any shortness of breath or chest pain at this time.  Denies any nausea or vomiting.  Discharge Exam: Vitals:   07/28/19 0807 07/28/19 1942 07/28/19 2208 07/29/19 0608  BP: (!) 165/59  (!) 158/73 (!) 156/64  Pulse: 70  67 67  Resp:   20 17  Temp:   98.4 F (36.9 C) 98.5 F (36.9 C)  TempSrc:   Oral Oral  SpO2:  96% 99% 97%  Weight:      Height:        General: Pt is alert, awake, not in acute distress Cardiovascular: RRR, S1/S2 +, no rubs, no gallops Respiratory: CTA bilaterally, no wheezing, no rhonchi Abdominal:  Soft, NT, ND, bowel sounds + Extremities: no edema, no cyanosis    The results of significant diagnostics from this hospitalization (including imaging, microbiology, ancillary and laboratory) are listed below for reference.     Microbiology: Recent Results (from the past 240 hour(s))  SARS Coronavirus 2 [facility] order, Performed in [facility] hospital lab) Nasopharyngeal Nasopharyngeal Swab     Status: None   Collection Time: 07/20/19  5:53 PM   Specimen: Nasopharyngeal Swab  Result Value Ref Range Status   SARS Coronavirus 2 NEGATIVE NEGATIVE Final    Comment: (NOTE) If result is NEGATIVE SARS-CoV-2 target nucleic acids are NOT DETECTED. The SARS-CoV-2 RNA is generally detectable in upper and lower  respiratory specimens during the acute phase of infection. The lowest  concentration of SARS-CoV-2 viral copies this assay can detect  is 250  copies / mL. A negative result does not preclude SARS-CoV-2 infection  and should not be used as the sole basis for treatment or other  patient management decisions.  A negative result may occur with  improper specimen collection / handling, submission of specimen other  than nasopharyngeal swab, presence of viral mutation(s) within the  areas targeted by this assay, and inadequate number of viral copies  (<250 copies / mL). A negative result must be combined with clinical  observations, patient history, and epidemiological information. If result is POSITIVE SARS-CoV-2 target nucleic acids are DETECTED. The SARS-CoV-2 RNA is generally detectable in upper and lower  respiratory specimens dur ing the acute phase of infection.  Positive  results are indicative of active infection with SARS-CoV-2.  Clinical  correlation with patient history and other diagnostic information is  necessary to determine patient infection status.  Positive results do  not rule out bacterial infection or co-infection with other viruses. If result is PRESUMPTIVE  POSTIVE SARS-CoV-2 nucleic acids MAY BE PRESENT.   A presumptive positive result was obtained on the submitted specimen  and confirmed on repeat testing.  While 2019 novel coronavirus  (SARS-CoV-2) nucleic acids may be present in the submitted sample  additional confirmatory testing may be necessary for epidemiological  and / or clinical management purposes  to differentiate between  SARS-CoV-2 and other Sarbecovirus currently known to infect humans.  If clinically indicated additional testing with an alternate test  methodology [phone]) is advised. The SARS-CoV-2 RNA is generally  detectable in upper and lower respiratory sp ecimens during the acute  phase of infection. The expected result is Negative. Fact Sheet for Patients:  StrictlyIdeas.no Fact Sheet for Healthcare Providers: BankingDealers.co.za This test is not yet approved or cleared by the [place] FDA and has been authorized for detection and/or diagnosis of SARS-CoV-2 by FDA under an Emergency Use Authorization (EUA).  This EUA will remain in effect (meaning this test can be used) for the duration of the COVID-19 declaration under Section 564(b)(1) of the Act, 21 U.S.C. section 360bbb-3(b)(1), unless the authorization is terminated or revoked sooner. Performed at [facility], [address], [place], [place] [address]      Labs: BNP (last 3 results) Recent Labs    07/20/19 1631  BNP [id number]*   Basic Metabolic Panel: Recent Labs  Lab 07/25/19 0542 07/26/19 0555 07/27/19 0706 07/28/19 0533 07/29/19 0559  NA 146* 149* 141 143 141  K 3.9 3.9 3.5 3.5 3.4*  CL 112* 112* 108 109 111  CO2 23 23 22 23 23   GLUCOSE 237* 319* 216* 154* 133*  BUN 43* 52* 65* 56* 45*  CREATININE 2.23* 2.67* 2.82* 2.41* 2.00*  CALCIUM 8.8* 9.0 8.5* 8.1* 7.9*  PHOS  --  4.1 3.5 3.8 3.5   Liver Function Tests: Recent Labs  Lab 07/26/19 0555 07/27/19 0706 07/28/19 0533  07/29/19 0559  ALBUMIN 3.4* 3.3* 3.1* 2.8*   No results for input(s): LIPASE, AMYLASE in the last 168 hours. No results for input(s): AMMONIA in the last 168 hours. CBC: Recent Labs  Lab 07/25/19 0542 07/26/19 0556 07/27/19 0706 07/28/19 0533 07/29/19 0559  WBC 13.5* 19.1* 21.6* 15.2* 14.5*  HGB 11.0* 11.3* 11.6* 10.9* 10.4*  HCT 35.2* 35.7* 36.0 34.0* 32.5*  MCV 87.1 86.4 85.5 85.6 86.4  PLT 225 273 280 251 229   Cardiac Enzymes: No results for input(s): CKTOTAL, CKMB, CKMBINDEX, TROPONINI in the last 168 hours. BNP: Invalid input(s): POCBNP CBG: Recent Labs  Lab 07/28/19 0800 07/28/19 1125 07/28/19 1703 07/28/19 2205 07/29/19 0816  GLUCAP 152* 163* 110* 140* 139*   D-Dimer No results for input(s): DDIMER in the last 72 hours. Hgb A1c No results for input(s): HGBA1C in the last 72 hours. Lipid Profile No results for input(s): CHOL, HDL, LDLCALC, TRIG, CHOLHDL, LDLDIRECT in the last 72 hours. Thyroid function studies No results for input(s): TSH, T4TOTAL, T3FREE, THYROIDAB in the last 72 hours.  Invalid input(s): FREET3 Anemia work up No results for input(s): VITAMINB12, FOLATE, FERRITIN, TIBC, IRON, RETICCTPCT in the last 72 hours. Urinalysis    Component Value Date/Time   COLORURINE YELLOW 07/20/2019 1353   APPEARANCEUR HAZY (A) 07/20/2019 1353   LABSPEC 1.009 07/20/2019 1353   PHURINE 6.0 07/20/2019 1353   GLUCOSEU NEGATIVE 07/20/2019 1353   HGBUR MODERATE (A) 07/20/2019 1353   BILIRUBINUR NEGATIVE 07/20/2019 1353   KETONESUR NEGATIVE 07/20/2019 1353   PROTEINUR NEGATIVE 07/20/2019 1353   NITRITE NEGATIVE 07/20/2019 1353   LEUKOCYTESUR NEGATIVE 07/20/2019 1353   Sepsis Labs Invalid input(s): PROCALCITONIN,  WBC,  LACTICIDVEN Microbiology Recent Results (from the past 240 hour(s))  SARS Coronavirus 2 [facility] order, Performed in [facility] hospital lab) Nasopharyngeal Nasopharyngeal Swab     Status: None   Collection Time: 07/20/19  5:53 PM    Specimen: Nasopharyngeal Swab  Result Value Ref Range Status   SARS Coronavirus 2 NEGATIVE NEGATIVE Final    Comment: (NOTE) If result is NEGATIVE SARS-CoV-2 target nucleic acids are NOT DETECTED. The SARS-CoV-2 RNA is generally detectable in upper and lower  respiratory specimens during the acute phase of infection. The lowest  concentration of SARS-CoV-2 viral copies this assay can detect is 250  copies / mL. A negative result does not preclude SARS-CoV-2 infection  and should not be used as the sole basis for treatment or other  patient management decisions.  A negative result may occur with  improper specimen collection / handling, submission of specimen other  than nasopharyngeal swab, presence of viral mutation(s) within the  areas targeted by this assay, and inadequate number of viral copies  (<250 copies / mL). A negative result must be combined with clinical  observations, patient history, and epidemiological information. If result is POSITIVE SARS-CoV-2 target nucleic acids are DETECTED. The SARS-CoV-2 RNA is generally detectable in upper and lower  respiratory specimens dur ing the acute phase of infection.  Positive  results are indicative of active infection with SARS-CoV-2.  Clinical  correlation with patient history and other diagnostic information is  necessary to determine patient infection status.  Positive results do  not rule out bacterial infection or co-infection with other viruses. If result is PRESUMPTIVE POSTIVE SARS-CoV-2 nucleic acids MAY BE PRESENT.   A presumptive positive result was obtained on the submitted specimen  and confirmed on repeat testing.  While 2019 novel coronavirus  (SARS-CoV-2) nucleic acids may be present in the submitted sample  additional confirmatory testing may be necessary for epidemiological  and / or clinical management purposes  to differentiate between  SARS-CoV-2 and other Sarbecovirus currently known to infect humans.  If  clinically indicated additional testing with an alternate test  methodology [phone]) is advised. The SARS-CoV-2 RNA is generally  detectable in upper and lower respiratory sp ecimens during the acute  phase of infection. The expected result is Negative. Fact Sheet for Patients:  StrictlyIdeas.no Fact Sheet for Healthcare Providers: BankingDealers.co.za This test is not yet approved or cleared by the [place] FDA and has been authorized  for detection and/or diagnosis of SARS-CoV-2 by FDA under an Emergency Use Authorization (EUA).  This EUA will remain in effect (meaning this test can be used) for the duration of the COVID-19 declaration under Section 564(b)(1) of the Act, 21 U.S.C. section 360bbb-3(b)(1), unless the authorization is terminated or revoked sooner. Performed at [facility], [address], [place], [place] [address]      Time coordinating discharge: 64mins  SIGNED:   Kathie Dike, MD  [facility] 07/29/2019, 10:18 AM   If 7PM-7AM, please contact night-coverage [email or web address]

## 2019-07-29 NOTE — TOC Transition Note (Signed)
Transition of Care [facility]) - CM/SW Discharge Note   Patient Details  Name: Victoria Winters MRN: [id number] Date of Birth: [birth year]  Transition [facility]) CM/SW Contact:  Latanya Maudlin, RN Phone Number: 07/29/2019, 11:06 AM   Clinical Narrative:  Spoke with Debbie in admission at [facility]. She has received insurance authorization and can receive patient today. Bedside RN given number for report. Will use EMS for transport.     Final next level of care: Skilled Nursing Facility Barriers to Discharge: No Barriers Identified   Patient Goals and CMS Choice   CMS Medicare.gov Compare Post Acute Care list provided to:: Patient Represenative (must comment) Choice offered to / list presented to : Sibling  Discharge Placement PASRR number recieved: 07/21/19            Patient chooses bed at: Other - please specify in the comment section below:([facility]) Patient to be transferred to facility by: EMS      Discharge Plan and Services In-house Referral: Clinical Social Work   Post Acute Care Choice: [place]                               Social Determinants of Health (SDOH) Interventions     Readmission Risk Interventions Readmission Risk Prevention Plan 07/28/2019 07/21/2019  Transportation Screening - Complete  PCP or Specialist Appt within 3-5 Days Not Complete -  Not Complete comments Pt going to SNF rehab where the SNF MD will follow -  Medication Review (RN CM) - Complete  HRI or Home Care Consult Not Complete -  [place] or Home Care Consult comments Pt going to SNF rehab -  Social Work Consult for [place] Planning/Counseling Complete -  Palliative Care Screening Not Applicable -  Medication Review Press photographer) Complete -

## 2019-07-29 NOTE — Progress Notes (Signed)
Notified [facility] of need for transport to [facility]. Donavan Foil, RN

## 2019-08-01 LAB — CREATININE CLEARANCE, URINE, 24 HOUR
Collection Interval-CRCL: 24 hours
Creatinine Clearance: 38 mL/min — ABNORMAL LOW (ref 75–115)
Creatinine, 24H Ur: 1608 mg/d (ref 600–1800)
Creatinine, Urine: 67 mg/dL
Urine Total Volume-CRCL: 2400 mL

## 2019-08-30 ENCOUNTER — Other Ambulatory Visit: Payer: Self-pay

## 2019-08-30 ENCOUNTER — Encounter (HOSPITAL_COMMUNITY): Payer: Self-pay | Admitting: Emergency Medicine

## 2019-08-30 DIAGNOSIS — R110 Nausea: Secondary | ICD-10-CM | POA: Insufficient documentation

## 2019-08-30 DIAGNOSIS — Z5321 Procedure and treatment not carried out due to patient leaving prior to being seen by health care provider: Secondary | ICD-10-CM | POA: Insufficient documentation

## 2019-08-30 DIAGNOSIS — R42 Dizziness and giddiness: Secondary | ICD-10-CM | POA: Diagnosis present

## 2019-08-30 LAB — CBG MONITORING, ED: Glucose-Capillary: 115 mg/dL — ABNORMAL HIGH (ref 70–99)

## 2019-08-30 NOTE — ED Triage Notes (Signed)
Patient was eating dinner and started to feel dizzy. Patient complains of nausea but denies vomiting.

## 2019-08-31 ENCOUNTER — Emergency Department (HOSPITAL_COMMUNITY)
Admission: EM | Admit: 2019-08-31 | Discharge: 2019-08-31 | Discharge status: Against Medical Advice | Payer: Medicare Other | Attending: Emergency Medicine | Admitting: Emergency Medicine

## 2019-08-31 HISTORY — DX: Chronic kidney disease, stage 5: N18.5

## 2019-09-05 ENCOUNTER — Encounter (HOSPITAL_COMMUNITY): Payer: Self-pay

## 2019-09-05 ENCOUNTER — Emergency Department (HOSPITAL_COMMUNITY): Payer: Medicare Other

## 2019-09-05 ENCOUNTER — Emergency Department (HOSPITAL_COMMUNITY)
Admission: EM | Admit: 2019-09-05 | Discharge: 2019-09-06 | Disposition: A | Discharge status: Home / Self Care | Payer: Medicare Other | Attending: Emergency Medicine | Admitting: Emergency Medicine

## 2019-09-05 ENCOUNTER — Other Ambulatory Visit: Payer: Self-pay

## 2019-09-05 DIAGNOSIS — M5136 Other intervertebral disc degeneration, lumbar region: Secondary | ICD-10-CM | POA: Diagnosis not present

## 2019-09-05 DIAGNOSIS — E119 Type 2 diabetes mellitus without complications: Secondary | ICD-10-CM | POA: Diagnosis not present

## 2019-09-05 DIAGNOSIS — Z7982 Long term (current) use of aspirin: Secondary | ICD-10-CM | POA: Insufficient documentation

## 2019-09-05 DIAGNOSIS — N390 Urinary tract infection, site not specified: Secondary | ICD-10-CM | POA: Insufficient documentation

## 2019-09-05 DIAGNOSIS — Z79899 Other long term (current) drug therapy: Secondary | ICD-10-CM | POA: Diagnosis not present

## 2019-09-05 DIAGNOSIS — N184 Chronic kidney disease, stage 4 (severe): Secondary | ICD-10-CM | POA: Insufficient documentation

## 2019-09-05 DIAGNOSIS — R300 Dysuria: Secondary | ICD-10-CM | POA: Diagnosis present

## 2019-09-05 DIAGNOSIS — Z87891 Personal history of nicotine dependence: Secondary | ICD-10-CM | POA: Diagnosis not present

## 2019-09-05 DIAGNOSIS — Z794 Long term (current) use of insulin: Secondary | ICD-10-CM | POA: Diagnosis not present

## 2019-09-05 LAB — BASIC METABOLIC PANEL
Anion gap: 7 (ref 5–15)
BUN: 30 mg/dL — ABNORMAL HIGH (ref 8–23)
CO2: 24 mmol/L (ref 22–32)
Calcium: 8 mg/dL — ABNORMAL LOW (ref 8.9–10.3)
Chloride: 106 mmol/L (ref 98–111)
Creatinine, Ser: 2.32 mg/dL — ABNORMAL HIGH (ref 0.44–1.00)
GFR calc Af Amer: 24 mL/min — ABNORMAL LOW (ref >60)
GFR calc non Af Amer: 21 mL/min — ABNORMAL LOW (ref >60)
Glucose, Bld: 116 mg/dL — ABNORMAL HIGH (ref 70–99)
Potassium: 5 mmol/L (ref 3.5–5.1)
Sodium: 137 mmol/L (ref 135–145)

## 2019-09-05 LAB — URINALYSIS, ROUTINE W REFLEX MICROSCOPIC
Bilirubin Urine: NEGATIVE
Glucose, UA: NEGATIVE mg/dL
Hgb urine dipstick: NEGATIVE
Ketones, ur: NEGATIVE mg/dL
Nitrite: NEGATIVE
Protein, ur: 100 mg/dL — AB
Specific Gravity, Urine: 1.009 (ref 1.005–1.030)
pH: 8 (ref 5.0–8.0)

## 2019-09-05 LAB — CBC
HCT: 27.3 % — ABNORMAL LOW (ref 36.0–46.0)
Hemoglobin: 8.4 g/dL — ABNORMAL LOW (ref 12.0–15.0)
MCH: 27 pg (ref 26.0–34.0)
MCHC: 30.8 g/dL (ref 30.0–36.0)
MCV: 87.8 fL (ref 80.0–100.0)
Platelets: 321 10*3/uL (ref 150–400)
RBC: 3.11 MIL/uL — ABNORMAL LOW (ref 3.87–5.11)
RDW: 14.5 % (ref 11.5–15.5)
WBC: 10.9 10*3/uL — ABNORMAL HIGH (ref 4.0–10.5)
nRBC: 0 % (ref 0.0–0.2)

## 2019-09-05 LAB — MAGNESIUM: Magnesium: 2.5 mg/dL — ABNORMAL HIGH (ref 1.7–2.4)

## 2019-09-05 MED ORDER — MORPHINE SULFATE (PF) 4 MG/ML IV SOLN
4.0000 mg | Freq: Once | INTRAVENOUS | Status: AC
  Administered 2019-09-05: 4 mg via INTRAVENOUS
  Filled 2019-09-05: qty 1

## 2019-09-05 MED ORDER — LEVOFLOXACIN IN D5W 500 MG/100ML IV SOLN
500.0000 mg | Freq: Once | INTRAVENOUS | Status: AC
  Administered 2019-09-05: 19:00:00 500 mg via INTRAVENOUS
  Filled 2019-09-05: qty 100

## 2019-09-05 MED ORDER — ONDANSETRON HCL 4 MG/2ML IJ SOLN
4.0000 mg | Freq: Once | INTRAMUSCULAR | Status: AC
  Administered 2019-09-05: 19:00:00 4 mg via INTRAVENOUS
  Filled 2019-09-05: qty 2

## 2019-09-05 MED ORDER — LEVOFLOXACIN 500 MG PO TABS: 500.0000 mg | ORAL_TABLET | Freq: Every day | ORAL | 0 refills | Status: DC

## 2019-09-05 MED ORDER — TRAMADOL HCL 50 MG PO TABS: 50.0000 mg | ORAL_TABLET | Freq: Four times a day (QID) | ORAL | 0 refills | Status: DC | PRN

## 2019-09-05 MED ORDER — FUROSEMIDE 40 MG PO TABS
40.0000 mg | ORAL_TABLET | Freq: Once | ORAL | Status: AC
  Administered 2019-09-05: 40 mg via ORAL
  Filled 2019-09-05: qty 1

## 2019-09-05 MED ORDER — PROCHLORPERAZINE EDISYLATE 10 MG/2ML IJ SOLN
5.0000 mg | Freq: Once | INTRAMUSCULAR | Status: AC
  Administered 2019-09-05: 18:00:00 5 mg via INTRAVENOUS
  Filled 2019-09-05: qty 2

## 2019-09-05 MED ORDER — NITROFURANTOIN MONOHYD MACRO 100 MG PO CAPS: 100.0000 mg | ORAL_CAPSULE | Freq: Two times a day (BID) | ORAL | 0 refills | Status: DC

## 2019-09-05 MED ORDER — SODIUM CHLORIDE 0.9 % IV SOLN: 1000.0000 mL | INTRAVENOUS | Status: DC

## 2019-09-05 MED ORDER — SODIUM CHLORIDE 0.9 % IV BOLUS (SEPSIS)
500.0000 mL | Freq: Once | INTRAVENOUS | Status: AC
  Administered 2019-09-05: 18:00:00 500 mL via INTRAVENOUS

## 2019-09-05 NOTE — ED Provider Notes (Signed)
[facility] EMERGENCY DEPARTMENT Provider Note   CSN: [id number] Arrival date & time: 09/05/19  1258     History   Chief Complaint Chief Complaint  Patient presents with  . Dysuria    HPI Victoria Winters is a 68 y.o. female.     Patient is a 68 year old female who presents to the emergency department with a complaint of dysuria, back pain, and falls.  The patient's caregiver is the primary historian.  The primary caregiver states that the patient was recently discharged from the hospital and is currently receiving rehab at home.  He patient had an episode of dizziness last week.  This seems to have resolved on its own.  2 days ago the patient had problems with vomiting most of the day.  Patient also had pain with urination.  And on yesterday, September 28, the vomiting was improved and the patient was able to get some of her medications in.  This morning the caregiver states that the patient was having problems wetting the bed, complained of pain with urination.  The patient is continued to have some vomiting during the day today.  It was recommended by the patient's physical therapist as well as the primary physician that the patient be brought to the emergency department for additional evaluation and management.  There is been no reported fever.  There have been times when the caregiver states that the patient seemed to have been a little confused, but patient seems to be more in control at this time.  The history is provided by a caregiver.    Past Medical History:  Diagnosis Date  . Brain aneurysm   . Diabetes mellitus without complication ([place])   . Hallucinations    visual and auditory  . Memory loss   . Recurrent UTI   . Renal disorder   . Stage 5 chronic kidney disease ([place])   . Stroke [facility])     Patient Active Problem List   Diagnosis Date Noted  . Palliative care by specialist   . Goals of care, counseling/discussion   . History of CVA (cerebrovascular accident)   .  Acute encephalopathy 07/21/2019  . Fall 07/20/2019  . 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 Brain aneurysm 07/20/2019  . DM (diabetes mellitus) ([place]) 07/20/2019  . Bilateral fibular fractures 07/20/2019  . CVA (cerebral vascular accident) ([place]) 07/20/2019  . Hallucination 07/20/2019    Past Surgical History:  Procedure Laterality Date  . CHOLECYSTECTOMY    . SKIN GRAFT       OB History    Gravida  2   Para  2   Term  2   Preterm      AB      Living        SAB      TAB      Ectopic      Multiple      Live Births               Home Medications    Prior to Admission medications   Medication Sig Start Date End Date Taking? Authorizing Provider  acetaminophen-codeine (TYLENOL #4) 300-60 MG tablet Take 1 tablet by mouth every 4 (four) hours as needed for moderate pain.    [provider]  aspirin EC 325 MG tablet Take 1 tablet (325 mg total) by mouth every morning. 07/29/19   Kathie Dike, MD  atorvastatin (LIPITOR) 40 MG tablet Take 40 mg by mouth at bedtime.  [provider]  cloNIDine (CATAPRES - DOSED IN MG/24 HR) 0.2 mg/24hr patch Place 0.2 mg onto the skin every Sunday.    [provider]  Cranberry-Vitamin C-Vitamin E (CRANBERRY PLUS VITAMIN C) 4200-20-3 MG-MG-UNIT CAPS Take 1 capsule by mouth daily as needed (urinary tract health).    [provider]  escitalopram (LEXAPRO) 10 MG tablet Take 10 mg by mouth every morning.    [provider]  hydrALAZINE (APRESOLINE) 25 MG tablet Take 25 mg by mouth 3 (three) times daily.    [provider]  insulin glargine (LANTUS) 100 UNIT/ML injection Inject 0.1 mLs (10 Units total) into the skin at bedtime. 07/29/19   Kathie Dike, MD  insulin lispro (HUMALOG) 100 UNIT/ML injection Inject 1-17 Units into the skin 3 (three) times daily before meals. Sliding scale starting at; 180-220= 4 units 221-260= 6 units 261-300= 8 units 301-340=10units  341-380=12units    [provider]  isosorbide mononitrate (IMDUR) 30 MG 24 hr tablet Take 30 mg by mouth every morning.    [provider]  linagliptin (TRADJENTA) 5 MG TABS tablet Take 5 mg by mouth every morning.    [provider]  loratadine (CLARITIN) 10 MG tablet Take 10 mg by mouth every morning.    [provider]  Melatonin 1 MG TABS Take 1 mg by mouth at bedtime.    [provider]  metoprolol (TOPROL-XL) 200 MG 24 hr tablet Take 200 mg by mouth every morning.    [provider]  Multiple Vitamin (MULTIVITAMIN WITH MINERALS) TABS tablet Take 1 tablet by mouth daily.    [provider]  omeprazole (PRILOSEC) 20 MG capsule Take 20 mg by mouth every morning.    [provider]  QUEtiapine (SEROQUEL) 25 MG tablet Take 1 tablet (25 mg total) by mouth at bedtime. 07/29/19   Kathie Dike, MD    Family History Family History  Problem Relation Age of Onset  . Heart disease Mother   . Diabetes Mellitus II Mother   . Heart disease Father   . Diabetes Father   . Heart disease Brother   . Cancer Other     Social History Social History   Tobacco Use  . Smoking status: Former Research scientist (life sciences)  . Smokeless tobacco: Never Used  Substance Use Topics  . Alcohol use: Yes    Frequency: Never    Comment: rarely  . Drug use: Never     Allergies   Penicillins, Chicken allergy, Eggs or egg-derived products, Milk-related compounds, Other, and Wheat bran   Review of Systems Review of Systems  Constitutional: Negative for activity change and fever.       All ROS Neg except as noted in HPI  HENT: Negative.   Eyes: Negative for photophobia and discharge.  Respiratory: Negative for cough, shortness of breath and wheezing.   Cardiovascular: Negative for chest pain and palpitations.  Gastrointestinal: Negative for abdominal pain and blood in stool.  Genitourinary: Positive for dysuria. Negative for frequency and hematuria.   Musculoskeletal: Positive for back pain. Negative for arthralgias and neck pain.  Skin: Positive for wound.  Neurological: Negative for dizziness, seizures and speech difficulty.  Psychiatric/Behavioral: Positive for confusion. Negative for hallucinations.     Physical Exam Updated Vital Signs BP (!) 137/50 (BP Location: Left Arm)   Pulse (!) 59   Temp 97.9 F (36.6 C) (Oral)   Resp 15   Wt 99.8 kg   SpO2 91%   BMI 40.24 kg/m  Physical Exam Vitals signs and nursing note reviewed.  Constitutional:      Appearance: Normal appearance. She is well-developed. She is not toxic-appearing.  HENT:     Head: Normocephalic and atraumatic.     Right Ear: Tympanic membrane, ear canal and external ear normal.     Left Ear: Tympanic membrane, ear canal and external ear normal.     Nose: Nose normal.     Mouth/Throat:     Pharynx: Uvula midline.  Eyes:     General: Lids are normal.     Conjunctiva/sclera: Conjunctivae normal.     Pupils: Pupils are equal, round, and reactive to light.  Neck:     Musculoskeletal: Normal range of motion and neck supple.     Vascular: No carotid bruit.     Trachea: Trachea and phonation normal.  Cardiovascular:     Rate and Rhythm: Normal rate and regular rhythm.     Pulses: Normal pulses.     Heart sounds: Murmur present. Systolic murmur present with a grade of 2/6.  Pulmonary:     Effort: Pulmonary effort is normal. No respiratory distress.     Breath sounds: Rhonchi present.  Abdominal:     General: Bowel sounds are normal.     Palpations: Abdomen is soft.     Tenderness: There is no abdominal tenderness. There is right CVA tenderness. There is no guarding.  Musculoskeletal: Normal range of motion.  Lymphadenopathy:     Head:     Right side of head: No submental, submandibular, preauricular or posterior auricular adenopathy.     Left side of head: No submental, submandibular, preauricular or posterior auricular adenopathy.     Cervical: No  cervical adenopathy.  Skin:    General: Skin is warm and dry.  Neurological:     Mental Status: She is alert and oriented to person, place, and time.     GCS: GCS eye subscore is 4. GCS verbal subscore is 5. GCS motor subscore is 6.     Cranial Nerves: No cranial nerve deficit.     Sensory: No sensory deficit.  Psychiatric:        Speech: Speech normal.      ED Treatments / Results  Labs (all labs ordered are listed, but only abnormal results are displayed) Labs Reviewed  CBC - Abnormal; Notable for the following components:      Result Value   WBC 10.9 (*)    RBC 3.11 (*)    Hemoglobin 8.4 (*)    HCT 27.3 (*)    All other components within normal limits  BASIC METABOLIC PANEL - Abnormal; Notable for the following components:   Glucose, Bld 116 (*)    BUN 30 (*)    Creatinine, Ser 2.32 (*)    Calcium 8.0 (*)    GFR calc non Af Amer 21 (*)    GFR calc Af Amer 24 (*)    All other components within normal limits  URINALYSIS, ROUTINE W REFLEX MICROSCOPIC - Abnormal; Notable for the following components:   APPearance HAZY (*)    Protein, ur 100 (*)    Leukocytes,Ua TRACE (*)    Bacteria, UA RARE (*)    All other components within normal limits  URINE CULTURE    EKG None  Radiology No results found.  Procedures Procedures (including critical care time)  Medications Ordered in ED Medications - No data to display   Initial Impression / Assessment and Plan / ED Course  I  have reviewed the triage vital signs and the nursing notes.  Pertinent labs & imaging results that were available during my care of the patient were reviewed by me and considered in my medical decision making (see chart for details).          Final Clinical Impressions(s) / ED Diagnoses MDM  Patient presents to the emergency department with some dysuria, back pain and falls.  The patient has been complaining of pain with urination.  The patient had an episode of vomiting a few days ago.   Is been no high fever reported.  The patient has been having increased falls over recent weeks.  The family request evaluation of these issues.  The complete blood count shows the white blood cells to be elevated at 10,900.  The hemoglobin is 8.4, down from the previous evaluation.  The hematocrit is 27.3, also lower than the previous evaluation at 32.5.  The basic metabolic panel shows the glucose to be slightly elevated at 116, the BUN is elevated at 30, the creatinine is elevated at 2.30.  The glomerular filtration rateIs low at 21.  Review of old records show that this is patient's usual glomerular filtration rate range.   Urine analysis shows a hazy yellow specimen with a specific gravity 1.009.  There is trace leukocyte esterase.  There is 21-50 white blood cells and rare bacteria.  A culture of the urine has been sent to the lab.  Patient has been started on IV Rocephin.  Pt seen with me by Dr Sedonia Small.  Chest x-ray shows unchanged cardiomegaly.  There is some increased vascular congestion with new curly B-lines consistent with pulmonary edema.  Patient has a history of vascular congestion.  Patient has been off of the oral Lasix.  Oral Lasix given in the emergency department.  Patient has been having falls and complaining of pain of the lower back.  A CT scan of the lumbar spine was obtained to rule out compression fracture or other problems.  No acute osseous abnormality in the lumbar spine was noted.  There are multiple areas of degenerative changes noted. There also noted small retroperitoneal and pelvic lymph nodes present.  I will asked the family to have the primary physician to follow this finding closely.  Rx for levaquin and ultram given to the pateint. Patient is to return to the emergency department if any worsening of symptoms, high fever, vomiting, worsening of back pain, changes in general condition, problems or concerns.   Final diagnoses:  Lower urinary tract infectious disease   DDD (degenerative disc disease), lumbar    ED Discharge Orders         Ordered    nitrofurantoin, macrocrystal-monohydrate, (MACROBID) 100 MG capsule  2 times daily,   Status:  Discontinued     09/05/19 2241    traMADol (ULTRAM) 50 MG tablet  Every 6 hours PRN     09/05/19 2241    levofloxacin (LEVAQUIN) 500 MG tablet  Daily     09/05/19 2251           Lily Kocher, PA-C 09/05/19 2357    Maudie Flakes, MD 09/07/19 [phone]

## 2019-09-05 NOTE — Discharge Instructions (Addendum)
The urine test suggest a urinary tract infection.  The CT scan of the lumbar spine is negative for compression fractures.  There is noted however some extensive degenerative changes of the lumbar spine.  There is also noted some increase in the number of lymph nodes present.  Please have the primary physician recheck this to ensure that these are resolving.  The complete blood work revealed a slight worsening of anemia.  Please have the primary physician to recheck this as well.  Please increase fluids.  Use levoquin daily until all taken. Please have the urine rechecked in 5 to 7 days.  Use Tylenol extra strength for mild pain.  May use Ultram for more severe pain.  Ultram may cause drowsiness, and or lightheadedness.  Please use this medication with caution.

## 2019-09-05 NOTE — ED Triage Notes (Signed)
Pt here last Wednesday for dizziness. LWBS. Sunday morning pt started throwing up and having diarrhea. Today, pt having incontinence. Is having burning with urination.

## 2019-09-06 NOTE — ED Notes (Signed)
Pt sister called for transport home; ETA 30 min

## 2019-09-07 LAB — URINE CULTURE

## 2019-10-23 ENCOUNTER — Other Ambulatory Visit: Payer: Self-pay

## 2019-10-23 ENCOUNTER — Emergency Department (HOSPITAL_COMMUNITY)
Admission: EM | Admit: 2019-10-23 | Discharge: 2019-10-23 | Disposition: A | Discharge status: Home / Self Care | Payer: Medicare Other | Attending: Emergency Medicine | Admitting: Emergency Medicine

## 2019-10-23 ENCOUNTER — Encounter (HOSPITAL_COMMUNITY): Payer: Self-pay

## 2019-10-23 ENCOUNTER — Emergency Department (HOSPITAL_COMMUNITY): Payer: Medicare Other

## 2019-10-23 DIAGNOSIS — Z794 Long term (current) use of insulin: Secondary | ICD-10-CM | POA: Diagnosis not present

## 2019-10-23 DIAGNOSIS — Y999 Unspecified external cause status: Secondary | ICD-10-CM | POA: Diagnosis not present

## 2019-10-23 DIAGNOSIS — E1122 Type 2 diabetes mellitus with diabetic chronic kidney disease: Secondary | ICD-10-CM | POA: Diagnosis not present

## 2019-10-23 DIAGNOSIS — W19XXXA Unspecified fall, initial encounter: Secondary | ICD-10-CM | POA: Insufficient documentation

## 2019-10-23 DIAGNOSIS — Z79899 Other long term (current) drug therapy: Secondary | ICD-10-CM | POA: Insufficient documentation

## 2019-10-23 DIAGNOSIS — S82144A Nondisplaced bicondylar fracture of right tibia, initial encounter for closed fracture: Secondary | ICD-10-CM | POA: Insufficient documentation

## 2019-10-23 DIAGNOSIS — Y9301 Activity, walking, marching and hiking: Secondary | ICD-10-CM | POA: Insufficient documentation

## 2019-10-23 DIAGNOSIS — Y92019 Unspecified place in single-family (private) house as the place of occurrence of the external cause: Secondary | ICD-10-CM | POA: Diagnosis not present

## 2019-10-23 DIAGNOSIS — Z87891 Personal history of nicotine dependence: Secondary | ICD-10-CM | POA: Insufficient documentation

## 2019-10-23 DIAGNOSIS — S82891A Other fracture of right lower leg, initial encounter for closed fracture: Secondary | ICD-10-CM

## 2019-10-23 DIAGNOSIS — Z8673 Personal history of transient ischemic attack (TIA), and cerebral infarction without residual deficits: Secondary | ICD-10-CM | POA: Diagnosis not present

## 2019-10-23 DIAGNOSIS — N185 Chronic kidney disease, stage 5: Secondary | ICD-10-CM | POA: Diagnosis not present

## 2019-10-23 DIAGNOSIS — S8991XA Unspecified injury of right lower leg, initial encounter: Secondary | ICD-10-CM | POA: Diagnosis present

## 2019-10-23 LAB — COMPREHENSIVE METABOLIC PANEL
ALT: 16 U/L (ref 0–44)
AST: 16 U/L (ref 15–41)
Albumin: 3.4 g/dL — ABNORMAL LOW (ref 3.5–5.0)
Alkaline Phosphatase: 89 U/L (ref 38–126)
Anion gap: 9 (ref 5–15)
BUN: 59 mg/dL — ABNORMAL HIGH (ref 8–23)
CO2: 27 mmol/L (ref 22–32)
Calcium: 8.3 mg/dL — ABNORMAL LOW (ref 8.9–10.3)
Chloride: 98 mmol/L (ref 98–111)
Creatinine, Ser: 3.38 mg/dL — ABNORMAL HIGH (ref 0.44–1.00)
GFR calc Af Amer: 15 mL/min — ABNORMAL LOW (ref >60)
GFR calc non Af Amer: 13 mL/min — ABNORMAL LOW (ref >60)
Glucose, Bld: 116 mg/dL — ABNORMAL HIGH (ref 70–99)
Potassium: 5.5 mmol/L — ABNORMAL HIGH (ref 3.5–5.1)
Sodium: 134 mmol/L — ABNORMAL LOW (ref 135–145)
Total Bilirubin: 0.6 mg/dL (ref 0.3–1.2)
Total Protein: 6.5 g/dL (ref 6.5–8.1)

## 2019-10-23 LAB — CBC WITH DIFFERENTIAL/PLATELET
Abs Immature Granulocytes: 0.05 10*3/uL (ref 0.00–0.07)
Basophils Absolute: 0 10*3/uL (ref 0.0–0.1)
Basophils Relative: 0 %
Eosinophils Absolute: 0.6 10*3/uL — ABNORMAL HIGH (ref 0.0–0.5)
Eosinophils Relative: 5 %
HCT: 36.1 % (ref 36.0–46.0)
Hemoglobin: 11 g/dL — ABNORMAL LOW (ref 12.0–15.0)
Immature Granulocytes: 0 %
Lymphocytes Relative: 26 %
Lymphs Abs: 3.5 10*3/uL (ref 0.7–4.0)
MCH: 27.1 pg (ref 26.0–34.0)
MCHC: 30.5 g/dL (ref 30.0–36.0)
MCV: 88.9 fL (ref 80.0–100.0)
Monocytes Absolute: 0.9 10*3/uL (ref 0.1–1.0)
Monocytes Relative: 7 %
Neutro Abs: 8.5 10*3/uL — ABNORMAL HIGH (ref 1.7–7.7)
Neutrophils Relative %: 62 %
Platelets: 233 10*3/uL (ref 150–400)
RBC: 4.06 MIL/uL (ref 3.87–5.11)
RDW: 15 % (ref 11.5–15.5)
WBC: 13.6 10*3/uL — ABNORMAL HIGH (ref 4.0–10.5)
nRBC: 0 % (ref 0.0–0.2)

## 2019-10-23 LAB — URINALYSIS, ROUTINE W REFLEX MICROSCOPIC
Bilirubin Urine: NEGATIVE
Glucose, UA: NEGATIVE mg/dL
Ketones, ur: NEGATIVE mg/dL
Nitrite: NEGATIVE
Protein, ur: NEGATIVE mg/dL
Specific Gravity, Urine: 1.008 (ref 1.005–1.030)
pH: 5 (ref 5.0–8.0)

## 2019-10-23 LAB — TSH: TSH: 3.489 u[IU]/mL (ref 0.350–4.500)

## 2019-10-23 LAB — MAGNESIUM: Magnesium: 2.6 mg/dL — ABNORMAL HIGH (ref 1.7–2.4)

## 2019-10-23 MED ORDER — SODIUM CHLORIDE 0.9 % IV BOLUS
1000.0000 mL | Freq: Once | INTRAVENOUS | Status: AC
  Administered 2019-10-23: 1000 mL via INTRAVENOUS

## 2019-10-23 MED ORDER — HYDROCODONE-ACETAMINOPHEN 5-325 MG PO TABS: 1.0000 | ORAL_TABLET | Freq: Four times a day (QID) | ORAL | 0 refills | Status: DC | PRN

## 2019-10-23 MED ORDER — HYDROCODONE-ACETAMINOPHEN 5-325 MG PO TABS
2.0000 | ORAL_TABLET | Freq: Once | ORAL | Status: AC
  Administered 2019-10-23: 2 via ORAL
  Filled 2019-10-23: qty 2

## 2019-10-23 NOTE — ED Triage Notes (Signed)
Sister says pt fell while walking with walker Sat night.   Reports pt has history of falling.  Reports doesn't know what made her fall.  Pt went to PCP this morning and had xray of r knee and r ankle.  Reports has " nondisplaced hairline fracture o f the distal fibula"

## 2019-10-23 NOTE — ED Provider Notes (Signed)
[facility] EMERGENCY DEPARTMENT Provider Note   CSN: [id number] Arrival date & time: 10/23/19  1400     History   Chief Complaint No chief complaint on file.   HPI Victoria Winters is a 68 y.o. female.     HPI  This patient is a 68 year old female, she is accompanied by her sister who is the primary historian as the patient has a "inoperable brain tumor" (I have reviewed the medical record and an MRI as of August 2020 did not show any signs of inoperable brain tumor, it did show chronic ischemic findings with multiple small strokes in multiple areas of the brain including the cerebellum) as well as memory loss.  She has a history of diabetes, she has stage V chronic kidney disease and a history of a stroke.  She has recurrent urinary tract infections.  She currently lives with her sister who is her primary caregiver.  There is home health services available as well.  The sister reports that approximately 36 hours ago as she was walking back and forth to the bathroom she lost her balance and fell causing pain and swelling to the right ankle.  Yesterday she sat in her recliner in hopes that it was just sprained but because the pain got worse today she brought her to the urgent care.  X-rays were performed and the report says that there was a possible nondisplaced ankle fracture.  The patient was redirected to the emergency department for further evaluation secondary to the urgent care wanting to have further stabilization of the fracture or further evaluation given the patient's chronic debilitating state.  The patient's daughter did give her tramadol yesterday but has not had any pain medication today  The sister reports that she is happy to take her home as long as she is stable.  She does have chronic urinary tract infections, it is unclear if she has recently had one but there has been no fevers nausea vomiting or diarrhea.  She did stool in her undergarment on the way to the hospital.  She  does wear a pull-up at baseline.  Past Medical History:  Diagnosis Date   Brain aneurysm    Diabetes mellitus without complication (HCC)    Hallucinations    visual and auditory   Memory loss    Recurrent UTI    Renal disorder    Stage 5 chronic kidney disease ([place])    Stroke [facility])     Patient Active Problem List   Diagnosis Date Noted   Palliative care by specialist    Goals of care, counseling/discussion    History of CVA (cerebrovascular accident)    Acute encephalopathy 07/21/2019   Fall 07/20/2019   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 07/20/2019   Brain aneurysm 07/20/2019   DM (diabetes mellitus) ([place]) 07/20/2019   Bilateral fibular fractures 07/20/2019   CVA (cerebral vascular accident) ([place]) 07/20/2019   Hallucination 07/20/2019    Past Surgical History:  Procedure Laterality Date   CHOLECYSTECTOMY     SKIN GRAFT       OB History    Gravida  2   Para  2   Term  2   Preterm      AB      Living        SAB      TAB      Ectopic      Multiple      Live Births  Home Medications    Prior to Admission medications   Medication Sig Start Date End Date Taking? Authorizing Provider  acetaminophen-codeine (TYLENOL #4) 300-60 MG tablet Take 1 tablet by mouth every 4 (four) hours as needed for moderate pain.    [provider]  aspirin EC 325 MG tablet Take 1 tablet (325 mg total) by mouth every morning. 07/29/19   Kathie Dike, MD  atorvastatin (LIPITOR) 40 MG tablet Take 40 mg by mouth at bedtime.    [provider]  cloNIDine (CATAPRES - DOSED IN MG/24 HR) 0.2 mg/24hr patch Place 0.2 mg onto the skin every Sunday.    [provider]  Cranberry-Vitamin C-Vitamin E (CRANBERRY PLUS VITAMIN C) 4200-20-3 MG-MG-UNIT CAPS Take 1 capsule by mouth daily as needed (urinary tract health).    [provider]  escitalopram (LEXAPRO) 10 MG tablet Take 10 mg by mouth every morning.     [provider]  hydrALAZINE (APRESOLINE) 25 MG tablet Take 25 mg by mouth 3 (three) times daily.    [provider]  insulin glargine (LANTUS) 100 UNIT/ML injection Inject 0.1 mLs (10 Units total) into the skin at bedtime. 07/29/19   Kathie Dike, MD  insulin lispro (HUMALOG) 100 UNIT/ML injection Inject 1-17 Units into the skin 3 (three) times daily before meals. Sliding scale starting at; 180-220= 4 units 221-260= 6 units 261-300= 8 units 301-340=10units 341-380=12units    [provider]  isosorbide mononitrate (IMDUR) 30 MG 24 hr tablet Take 30 mg by mouth every morning.    [provider]  levofloxacin (LEVAQUIN) 500 MG tablet Take 1 tablet (500 mg total) by mouth daily. 09/05/19   Lily Kocher, PA-C  linagliptin (TRADJENTA) 5 MG TABS tablet Take 5 mg by mouth every morning.    [provider]  loratadine (CLARITIN) 10 MG tablet Take 10 mg by mouth every morning.    [provider]  Melatonin 1 MG TABS Take 1 mg by mouth at bedtime.    [provider]  metoprolol (TOPROL-XL) 200 MG 24 hr tablet Take 200 mg by mouth every morning.    [provider]  Multiple Vitamin (MULTIVITAMIN WITH MINERALS) TABS tablet Take 1 tablet by mouth daily.    [provider]  omeprazole (PRILOSEC) 20 MG capsule Take 20 mg by mouth every morning.    [provider]  QUEtiapine (SEROQUEL) 25 MG tablet Take 1 tablet (25 mg total) by mouth at bedtime. 07/29/19   Kathie Dike, MD  traMADol (ULTRAM) 50 MG tablet Take 1 tablet (50 mg total) by mouth every 6 (six) hours as needed. 09/05/19   Lily Kocher, PA-C    Family History Family History  Problem Relation Age of Onset   Heart disease Mother    Diabetes Mellitus II Mother    Heart disease Father    Diabetes Father    Heart disease Brother    Cancer Other     Social History Social History   Tobacco Use   Smoking status: Former Smoker   Smokeless  tobacco: Never Used  Substance Use Topics   Alcohol use: Yes    Frequency: Never    Comment: rarely   Drug use: Never     Allergies   Penicillins, Chicken allergy, Eggs or egg-derived products, Milk-related compounds, Other, and Wheat bran   Review of Systems Review of Systems  Unable to perform ROS: Other     Physical Exam Updated Vital Signs There were no vitals taken for this  visit.  Physical Exam Vitals signs and nursing note reviewed.  Constitutional:      General: She is not in acute distress.    Appearance: She is well-developed.  HENT:     Head: Normocephalic and atraumatic.     Mouth/Throat:     Pharynx: No oropharyngeal exudate.  Eyes:     General: No scleral icterus.       Right eye: No discharge.        Left eye: No discharge.     Conjunctiva/sclera: Conjunctivae normal.     Pupils: Pupils are equal, round, and reactive to light.  Neck:     Musculoskeletal: Normal range of motion and neck supple.     Thyroid: No thyromegaly.     Vascular: No JVD.  Cardiovascular:     Rate and Rhythm: Normal rate and regular rhythm.     Heart sounds: Normal heart sounds. No murmur. No friction rub. No gallop.   Pulmonary:     Effort: Pulmonary effort is normal. No respiratory distress.     Breath sounds: Normal breath sounds. No wheezing or rales.  Abdominal:     General: Bowel sounds are normal. There is no distension.     Palpations: Abdomen is soft. There is no mass.     Tenderness: There is no abdominal tenderness.  Musculoskeletal:        General: Swelling, tenderness, deformity and signs of injury present.     Comments: All 4 extremities with normal range of motion except for the right ankle which has decreased range of motion secondary to pain and swelling.  There is tenderness laterally over the distal malleolus.  Mild swelling of the ankle, no open wounds, no bleeding, normal pulses on the top of the feet bilaterally  Lymphadenopathy:     Cervical: No  cervical adenopathy.  Skin:    General: Skin is warm and dry.     Findings: No erythema or rash.  Neurological:     Mental Status: She is alert.     Coordination: Coordination normal.     Comments: The patient is able to stand pivot and sit on the bed, she is able to follow simple commands, she does not have any memory of the events.  The sister gives all the history.  Psychiatric:        Behavior: Behavior normal.      ED Treatments / Results  Labs (all labs ordered are listed, but only abnormal results are displayed) Labs Reviewed  URINE CULTURE  CBC WITH DIFFERENTIAL/PLATELET  COMPREHENSIVE METABOLIC PANEL  TSH  URINALYSIS, ROUTINE W REFLEX MICROSCOPIC  MAGNESIUM    EKG None  Radiology No results found.  Procedures .Splint Application  Date/Time: 10/23/2019 3:26 PM Performed by: Noemi Chapel, MD Authorized by: Noemi Chapel, MD   Consent:    Consent obtained:  Verbal   Consent given by:  Patient   Risks discussed:  Discoloration, numbness, pain and swelling   Alternatives discussed:  No treatment Pre-procedure details:    Sensation:  Normal Procedure details:    Laterality:  Right   Location:  Ankle   Ankle:  R ankle   Splint type:  Ankle stirrup   Supplies:  Elastic bandage, Ortho-Glass and cotton padding Post-procedure details:    Pain:  Improved   Sensation:  Normal   Patient tolerance of procedure:  Tolerated well, no immediate complications Comments:       .Splint Application  Date/Time: 10/23/2019 3:30 PM Performed by: Noemi Chapel,  MD Authorized by: Noemi Chapel, MD   Consent:    Consent obtained:  Verbal   Consent given by:  Patient   Risks discussed:  Numbness, pain and swelling Pre-procedure details:    Sensation:  Normal Procedure details:    Laterality:  Right   Location:  Leg   Leg:  R lower leg   Splint type:  Short leg   Supplies:  Elastic bandage, cotton padding and Ortho-Glass Post-procedure details:    Pain:   Improved   Sensation:  Normal   Patient tolerance of procedure:  Tolerated well, no immediate complications Comments:         (including critical care time)  Medications Ordered in ED Medications - No data to display   Initial Impression / Assessment and Plan / ED Course  I have reviewed the triage vital signs and the nursing notes.  Pertinent labs & imaging results that were available during my care of the patient were reviewed by me and considered in my medical decision making (see chart for details).  Clinical Course as of Oct 22 1525  Mon Oct 23, 2019  1507 I have personally viewed the x-rays of the ankle complete, the ankle mortise is stable, the fracture is located at the level of the tibial plateau.  Overall appears stable.  Will place in a stirrup and posterior short leg   [BM]    Clinical Course User Index [BM] Noemi Chapel, MD       The patient does not appear ill, she does have what appears to be a possible sprain or break in her ankle.  I cannot look at the outside films which were brought.  At this time will obtain x-ray, check labs including urinalysis, the patient otherwise appears unremarkable, afebrile and in no distress.  Pain medications offered -   At change of shift - care signed out to Dr. Roderic Palau to follow up results and disposition accordingly.  Familiy updated  Final Clinical Impressions(s) / ED Diagnoses   Final diagnoses:  None    ED Discharge Orders    None       Noemi Chapel, MD 10/23/19 1530

## 2019-10-23 NOTE — ED Provider Notes (Signed)
Patient's lab reviewed and she has a mild AKI and a mild elevation of potassium.  I spoke with the hospitalist about the patient and it was hospitalist recommendation to hydrate her up with a liter of fluids and have her follow-up with her family doctor or kidney doctor in 1 to 2 days and drink a lot of fluids   Milton Ferguson, MD 10/23/19 [id number]

## 2019-10-23 NOTE — Discharge Instructions (Addendum)
Your x-ray shows that you have an ankle fracture.  Hopefully this will be a nonsurgical injury Please apply ice over your splint, keep the leg elevated as much as possible, do not bear weight, you will need to walk with crutches. Please follow-up with the orthopedic surgeon of your choice or Dr. Aline Brochure above. You may take hydrocodone, 1 tablet every 6 hours as needed for severe pain, this may make you constipated so you may want to take a stool softener with it Come back to the emergency department for increasing pain weakness swelling or numbness of the foot, ankle or leg.        Follow up with your family md or your kidney md in 1-2 days for a recheck of your potassium and kidney function

## 2019-10-23 NOTE — ED Notes (Signed)
Crutches given to pt however, pt unsteady attempting to walk.  Sister said pt will just use her wheelchair.

## 2019-10-24 LAB — URINE CULTURE: Culture: NO GROWTH

## 2019-10-30 ENCOUNTER — Other Ambulatory Visit: Payer: Self-pay

## 2019-10-30 ENCOUNTER — Ambulatory Visit (INDEPENDENT_AMBULATORY_CARE_PROVIDER_SITE_OTHER): Payer: Medicare Other | Admitting: Orthopedic Surgery

## 2019-10-30 VITALS — BP 115/56 | HR 62 | Temp 97.0°F | Ht 61.0 in

## 2019-10-30 DIAGNOSIS — S82831A Other fracture of upper and lower end of right fibula, initial encounter for closed fracture: Secondary | ICD-10-CM

## 2019-10-30 NOTE — Patient Instructions (Signed)
Keep brace on   Do not walk on it but you can stand on it for balancing

## 2019-10-30 NOTE — Progress Notes (Signed)
Victoria Winters  10/30/2019  Body mass index is 39.68 kg/m.   HISTORY SECTION :  Chief Complaint  Patient presents with  . New Patient (Initial Visit)    Fractured Right Ankle DOI 10-21-19   HPI The patient presents for evaluation of  (mild/moderate/severe/ ) currently mild pain, in the (right /left) right ankle, for 9 days, associated with swelling.  Prior treatment splint   Review of Systems  Constitutional: Negative for chills and fever.  Neurological: Negative for tingling and sensory change.  Psychiatric/Behavioral: Positive for memory loss.     has a past medical history of Brain aneurysm, Diabetes mellitus without complication ([place]), Hallucinations, Memory loss, Recurrent UTI, Renal disorder, Stage 5 chronic kidney disease ([place]), and Stroke ([place]).   Past Surgical History:  Procedure Laterality Date  . CHOLECYSTECTOMY    . SKIN GRAFT      Body mass index is 39.68 kg/m.   Allergies  Allergen Reactions  . Penicillins Anaphylaxis    Did it involve swelling of the face/tongue/throat, SOB, or low BP? Yes Did it involve sudden or severe rash/hives, skin peeling, or any reaction on the inside of your mouth or nose? Unknown Did you need to seek medical attention at a hospital or doctor's office? Unknown When did it last happen?Within past 10 years If all above answers are "NO", may proceed with cephalosporin use.   . Chicken Allergy   . Eggs Or Egg-Derived Products Swelling  . Milk-Related Compounds   . Nickel   . Other     Black beans, cats, dogs, grass clippings  . Wheat Bran      Current Outpatient Medications:  .  acetaminophen-codeine (TYLENOL #4) 300-60 MG tablet, Take 1 tablet by mouth every 4 (four) hours as needed for moderate pain., Disp: , Rfl:  .  aspirin EC 325 MG tablet, Take 1 tablet (325 mg total) by mouth every morning., Disp: , Rfl:  .  atorvastatin (LIPITOR) 20 MG tablet, Take 20 mg by mouth daily., Disp: , Rfl:  .  cloNIDine (CATAPRES -  DOSED IN MG/24 HR) 0.2 mg/24hr patch, Place 0.2 mg onto the skin every Sunday., Disp: , Rfl:  .  clopidogrel (PLAVIX) 75 MG tablet, Take 75 mg by mouth daily., Disp: , Rfl:  .  Cranberry-Vitamin C-Vitamin E (CRANBERRY PLUS VITAMIN C) 4200-20-3 MG-MG-UNIT CAPS, Take 1 capsule by mouth daily as needed (urinary tract health)., Disp: , Rfl:  .  doxycycline (VIBRA-TABS) 100 MG tablet, Take 100 mg by mouth 2 (two) times daily. 14 day course starting on 10/23/2019, Disp: , Rfl:  .  escitalopram (LEXAPRO) 10 MG tablet, Take 10 mg by mouth every morning., Disp: , Rfl:  .  furosemide (LASIX) 40 MG tablet, Take 40 mg by mouth daily., Disp: , Rfl:  .  hydrALAZINE (APRESOLINE) 25 MG tablet, Take 25 mg by mouth 3 (three) times daily., Disp: , Rfl:  .  HYDROcodone-acetaminophen (NORCO/VICODIN) 5-325 MG tablet, Take 1 tablet by mouth every 6 (six) hours as needed., Disp: 10 tablet, Rfl: 0 .  insulin glargine (LANTUS) 100 UNIT/ML injection, Inject 0.1 mLs (10 Units total) into the skin at bedtime., Disp: 10 mL, Rfl: 11 .  insulin lispro (HUMALOG) 100 UNIT/ML injection, Inject 1-17 Units into the skin 3 (three) times daily before meals. Sliding scale starting at; 180-220= 4 units 221-260= 6 units 261-300= 8 units 301-340=10units 341-380=12units, Disp: , Rfl:  .  isosorbide mononitrate (IMDUR) 30 MG 24 hr tablet, Take 30 mg by mouth every morning.,  Disp: , Rfl:  .  linagliptin (TRADJENTA) 5 MG TABS tablet, Take 5 mg by mouth every morning., Disp: , Rfl:  .  loratadine (CLARITIN) 10 MG tablet, Take 10 mg by mouth every morning., Disp: , Rfl:  .  Melatonin 1 MG TABS, Take 1 mg by mouth at bedtime., Disp: , Rfl:  .  metoprolol (TOPROL-XL) 200 MG 24 hr tablet, Take 200 mg by mouth every morning., Disp: , Rfl:  .  Multiple Vitamin (MULTIVITAMIN WITH MINERALS) TABS tablet, Take 1 tablet by mouth daily., Disp: , Rfl:  .  omeprazole (PRILOSEC) 20 MG capsule, Take 20 mg by mouth every morning., Disp: , Rfl:  .  pregabalin  (LYRICA) 75 MG capsule, , Disp: , Rfl:  .  QUEtiapine (SEROQUEL) 25 MG tablet, Take 1 tablet (25 mg total) by mouth at bedtime., Disp:  , Rfl:  .  traMADol (ULTRAM) 50 MG tablet, Take 1 tablet (50 mg total) by mouth every 6 (six) hours as needed., Disp: 12 tablet, Rfl: 0 .  atorvastatin (LIPITOR) 40 MG tablet, Take 40 mg by mouth at bedtime., Disp: , Rfl:  .  levofloxacin (LEVAQUIN) 500 MG tablet, Take 1 tablet (500 mg total) by mouth daily. (Patient not taking: Reported on 10/30/2019), Disp: 6 tablet, Rfl: 0   PHYSICAL EXAM SECTION: 1) BP (!) 115/56   Pulse 62   Temp (!) 97 F (36.1 C)   Ht 5\' 1"  (1.549 m)   BMI 39.68 kg/m   Body mass index is 39.68 kg/m. General appearance: Normal large body habitus she is oriented to place but not time she is oriented to person  Mood pleasant affect flat  Gait came in in a wheelchair  Right lower extremity skin is ecchymotic but intact swelling and tenderness lateral malleolus range of motion foot goes to neutral plantarflexion is 10 degrees ankle is stable muscle tone is normal lymph nodes are negative sensation to palpation pressure and pain intact      MEDICAL DECISION SECTION:  Encounter Diagnosis  Name Primary?  . Other closed fracture of distal end of right fibula, initial encounter Yes    Imaging X-ray in the hospital shows nondisplaced lateral malleolus fracture with intact ankle mortise  Plan:  (Rx., Inj., surg., Frx, MRI/CT, XR:2) Cam walker general nonweightbearing x-ray in 4 weeks okay to stand in the boot for balance to prevent any loss of balance  Repeat x-ray in 4 weeks 10:51 AM Arther Abbott, MD  10/30/2019

## 2019-11-23 DIAGNOSIS — S82831D Other fracture of upper and lower end of right fibula, subsequent encounter for closed fracture with routine healing: Secondary | ICD-10-CM | POA: Insufficient documentation

## 2019-11-27 ENCOUNTER — Ambulatory Visit (INDEPENDENT_AMBULATORY_CARE_PROVIDER_SITE_OTHER): Payer: Medicare Other | Admitting: Orthopedic Surgery

## 2019-11-27 ENCOUNTER — Other Ambulatory Visit: Payer: Self-pay

## 2019-11-27 ENCOUNTER — Ambulatory Visit: Payer: Medicare Other

## 2019-11-27 VITALS — Temp 97.1°F

## 2019-11-27 DIAGNOSIS — S82831D Other fracture of upper and lower end of right fibula, subsequent encounter for closed fracture with routine healing: Secondary | ICD-10-CM

## 2019-11-27 NOTE — Progress Notes (Signed)
Chief Complaint  Patient presents with  . Follow-up    Recheck on right ankle, DOI 10-21-19.     This is post injury day number 37-week #5 in 2 days  Current treatment CAM Walker nonweightbearing  Jerniya has done well with the cam walker.  She does have episodes of dizziness so supervised walking with a walker weightbearing as tolerated can be instituted now when she comes back to take an x-ray out of the cam walker should have good fracture healing  Today's x-ray showed ankle mortise to be intact fracture position to be stable.

## 2019-12-25 ENCOUNTER — Ambulatory Visit: Payer: Medicare Other | Admitting: Orthopedic Surgery

## 2020-01-08 ENCOUNTER — Ambulatory Visit (INDEPENDENT_AMBULATORY_CARE_PROVIDER_SITE_OTHER): Payer: 59 | Admitting: Orthopedic Surgery

## 2020-01-08 ENCOUNTER — Ambulatory Visit: Payer: 59

## 2020-01-08 ENCOUNTER — Other Ambulatory Visit: Payer: Self-pay

## 2020-01-08 VITALS — BP 114/67 | HR 64 | Temp 97.2°F | Ht 61.0 in

## 2020-01-08 DIAGNOSIS — S82831D Other fracture of upper and lower end of right fibula, subsequent encounter for closed fracture with routine healing: Secondary | ICD-10-CM

## 2020-01-08 NOTE — Progress Notes (Signed)
Chief Complaint  Patient presents with  . Follow-up    Recheck on right ankle fracture, DOI 10-21-19.    Fracture care follow-up Weber B SER fracture 10 weeks from injury  X-rays show fracture has bridging callus ankle mortise intact  Clinical exam normal range of motion no tenderness at the fracture site  Recommend removing the boot return to normal shoewear continue using the walker  Patient has been released  Encounter Diagnosis  Name Primary?  . Other closed fracture of distal end of right fibula with routine healing, subsequent encounter 10/23/2019 Yes

## 2020-04-17 ENCOUNTER — Observation Stay (HOSPITAL_COMMUNITY)
Admission: EM | Admit: 2020-04-17 | Discharge: 2020-04-18 | Disposition: A | Discharge status: Home / Self Care | Payer: 59 | Attending: Family Medicine | Admitting: Family Medicine

## 2020-04-17 ENCOUNTER — Emergency Department (HOSPITAL_COMMUNITY): Payer: 59

## 2020-04-17 ENCOUNTER — Encounter (HOSPITAL_COMMUNITY): Payer: Self-pay | Admitting: Emergency Medicine

## 2020-04-17 ENCOUNTER — Inpatient Hospital Stay (HOSPITAL_COMMUNITY): Payer: 59

## 2020-04-17 ENCOUNTER — Other Ambulatory Visit: Payer: Self-pay

## 2020-04-17 DIAGNOSIS — R9431 Abnormal electrocardiogram [ECG] [EKG]: Secondary | ICD-10-CM | POA: Diagnosis present

## 2020-04-17 DIAGNOSIS — R404 Transient alteration of awareness: Secondary | ICD-10-CM

## 2020-04-17 DIAGNOSIS — Z880 Allergy status to penicillin: Secondary | ICD-10-CM | POA: Diagnosis not present

## 2020-04-17 DIAGNOSIS — M7989 Other specified soft tissue disorders: Secondary | ICD-10-CM | POA: Insufficient documentation

## 2020-04-17 DIAGNOSIS — Z833 Family history of diabetes mellitus: Secondary | ICD-10-CM | POA: Insufficient documentation

## 2020-04-17 DIAGNOSIS — D631 Anemia in chronic kidney disease: Secondary | ICD-10-CM | POA: Insufficient documentation

## 2020-04-17 DIAGNOSIS — E1165 Type 2 diabetes mellitus with hyperglycemia: Secondary | ICD-10-CM | POA: Diagnosis not present

## 2020-04-17 DIAGNOSIS — G9340 Encephalopathy, unspecified: Secondary | ICD-10-CM

## 2020-04-17 DIAGNOSIS — Z87440 Personal history of urinary (tract) infections: Secondary | ICD-10-CM | POA: Diagnosis not present

## 2020-04-17 DIAGNOSIS — D72829 Elevated white blood cell count, unspecified: Secondary | ICD-10-CM | POA: Diagnosis not present

## 2020-04-17 DIAGNOSIS — Z8673 Personal history of transient ischemic attack (TIA), and cerebral infarction without residual deficits: Secondary | ICD-10-CM | POA: Diagnosis not present

## 2020-04-17 DIAGNOSIS — G9389 Other specified disorders of brain: Secondary | ICD-10-CM | POA: Insufficient documentation

## 2020-04-17 DIAGNOSIS — I5023 Acute on chronic systolic (congestive) heart failure: Secondary | ICD-10-CM | POA: Insufficient documentation

## 2020-04-17 DIAGNOSIS — Z91011 Allergy to milk products: Secondary | ICD-10-CM | POA: Diagnosis not present

## 2020-04-17 DIAGNOSIS — Z91018 Allergy to other foods: Secondary | ICD-10-CM | POA: Diagnosis not present

## 2020-04-17 DIAGNOSIS — I313 Pericardial effusion (noninflammatory): Secondary | ICD-10-CM | POA: Insufficient documentation

## 2020-04-17 DIAGNOSIS — E669 Obesity, unspecified: Secondary | ICD-10-CM | POA: Insufficient documentation

## 2020-04-17 DIAGNOSIS — I951 Orthostatic hypotension: Secondary | ICD-10-CM | POA: Insufficient documentation

## 2020-04-17 DIAGNOSIS — J9601 Acute respiratory failure with hypoxia: Secondary | ICD-10-CM | POA: Diagnosis not present

## 2020-04-17 DIAGNOSIS — Z87891 Personal history of nicotine dependence: Secondary | ICD-10-CM | POA: Insufficient documentation

## 2020-04-17 DIAGNOSIS — Z9109 Other allergy status, other than to drugs and biological substances: Secondary | ICD-10-CM | POA: Insufficient documentation

## 2020-04-17 DIAGNOSIS — Z79899 Other long term (current) drug therapy: Secondary | ICD-10-CM | POA: Insufficient documentation

## 2020-04-17 DIAGNOSIS — M25561 Pain in right knee: Secondary | ICD-10-CM | POA: Insufficient documentation

## 2020-04-17 DIAGNOSIS — R413 Other amnesia: Secondary | ICD-10-CM | POA: Insufficient documentation

## 2020-04-17 DIAGNOSIS — R0602 Shortness of breath: Secondary | ICD-10-CM

## 2020-04-17 DIAGNOSIS — Z8249 Family history of ischemic heart disease and other diseases of the circulatory system: Secondary | ICD-10-CM | POA: Diagnosis not present

## 2020-04-17 DIAGNOSIS — J189 Pneumonia, unspecified organism: Secondary | ICD-10-CM

## 2020-04-17 DIAGNOSIS — Z7901 Long term (current) use of anticoagulants: Secondary | ICD-10-CM | POA: Insufficient documentation

## 2020-04-17 DIAGNOSIS — R4189 Other symptoms and signs involving cognitive functions and awareness: Secondary | ICD-10-CM | POA: Insufficient documentation

## 2020-04-17 DIAGNOSIS — Z6841 Body Mass Index (BMI) 40.0 and over, adult: Secondary | ICD-10-CM | POA: Insufficient documentation

## 2020-04-17 DIAGNOSIS — Z7189 Other specified counseling: Secondary | ICD-10-CM

## 2020-04-17 DIAGNOSIS — N189 Chronic kidney disease, unspecified: Secondary | ICD-10-CM | POA: Diagnosis present

## 2020-04-17 DIAGNOSIS — I671 Cerebral aneurysm, nonruptured: Secondary | ICD-10-CM | POA: Insufficient documentation

## 2020-04-17 DIAGNOSIS — I340 Nonrheumatic mitral (valve) insufficiency: Secondary | ICD-10-CM | POA: Insufficient documentation

## 2020-04-17 DIAGNOSIS — E1122 Type 2 diabetes mellitus with diabetic chronic kidney disease: Secondary | ICD-10-CM | POA: Insufficient documentation

## 2020-04-17 DIAGNOSIS — N185 Chronic kidney disease, stage 5: Secondary | ICD-10-CM | POA: Diagnosis not present

## 2020-04-17 DIAGNOSIS — G92 Toxic encephalopathy: Secondary | ICD-10-CM | POA: Insufficient documentation

## 2020-04-17 DIAGNOSIS — Z91012 Allergy to eggs: Secondary | ICD-10-CM | POA: Insufficient documentation

## 2020-04-17 DIAGNOSIS — Z20822 Contact with and (suspected) exposure to covid-19: Secondary | ICD-10-CM | POA: Diagnosis not present

## 2020-04-17 DIAGNOSIS — I6381 Other cerebral infarction due to occlusion or stenosis of small artery: Secondary | ICD-10-CM | POA: Insufficient documentation

## 2020-04-17 DIAGNOSIS — Z9049 Acquired absence of other specified parts of digestive tract: Secondary | ICD-10-CM | POA: Diagnosis not present

## 2020-04-17 DIAGNOSIS — J9621 Acute and chronic respiratory failure with hypoxia: Secondary | ICD-10-CM | POA: Diagnosis not present

## 2020-04-17 DIAGNOSIS — Z809 Family history of malignant neoplasm, unspecified: Secondary | ICD-10-CM | POA: Diagnosis not present

## 2020-04-17 DIAGNOSIS — R2689 Other abnormalities of gait and mobility: Secondary | ICD-10-CM | POA: Insufficient documentation

## 2020-04-17 DIAGNOSIS — Z7982 Long term (current) use of aspirin: Secondary | ICD-10-CM | POA: Insufficient documentation

## 2020-04-17 DIAGNOSIS — Z794 Long term (current) use of insulin: Secondary | ICD-10-CM | POA: Insufficient documentation

## 2020-04-17 DIAGNOSIS — E119 Type 2 diabetes mellitus without complications: Secondary | ICD-10-CM

## 2020-04-17 DIAGNOSIS — Z66 Do not resuscitate: Secondary | ICD-10-CM | POA: Diagnosis present

## 2020-04-17 LAB — COMPREHENSIVE METABOLIC PANEL
ALT: 17 U/L (ref 0–44)
AST: 16 U/L (ref 15–41)
Albumin: 3.3 g/dL — ABNORMAL LOW (ref 3.5–5.0)
Alkaline Phosphatase: 93 U/L (ref 38–126)
Anion gap: 10 (ref 5–15)
BUN: 52 mg/dL — ABNORMAL HIGH (ref 8–23)
CO2: 27 mmol/L (ref 22–32)
Calcium: 8.2 mg/dL — ABNORMAL LOW (ref 8.9–10.3)
Chloride: 104 mmol/L (ref 98–111)
Creatinine, Ser: 2.49 mg/dL — ABNORMAL HIGH (ref 0.44–1.00)
GFR calc Af Amer: 22 mL/min — ABNORMAL LOW (ref >60)
GFR calc non Af Amer: 19 mL/min — ABNORMAL LOW (ref >60)
Glucose, Bld: 215 mg/dL — ABNORMAL HIGH (ref 70–99)
Potassium: 4 mmol/L (ref 3.5–5.1)
Sodium: 141 mmol/L (ref 135–145)
Total Bilirubin: 1 mg/dL (ref 0.3–1.2)
Total Protein: 6.5 g/dL (ref 6.5–8.1)

## 2020-04-17 LAB — CBC WITH DIFFERENTIAL/PLATELET
Abs Immature Granulocytes: 0.08 10*3/uL — ABNORMAL HIGH (ref 0.00–0.07)
Basophils Absolute: 0.1 10*3/uL (ref 0.0–0.1)
Basophils Relative: 0 %
Eosinophils Absolute: 0.3 10*3/uL (ref 0.0–0.5)
Eosinophils Relative: 3 %
HCT: 29.9 % — ABNORMAL LOW (ref 36.0–46.0)
Hemoglobin: 9.3 g/dL — ABNORMAL LOW (ref 12.0–15.0)
Immature Granulocytes: 1 %
Lymphocytes Relative: 26 %
Lymphs Abs: 3.6 10*3/uL (ref 0.7–4.0)
MCH: 27.5 pg (ref 26.0–34.0)
MCHC: 31.1 g/dL (ref 30.0–36.0)
MCV: 88.5 fL (ref 80.0–100.0)
Monocytes Absolute: 1.1 10*3/uL — ABNORMAL HIGH (ref 0.1–1.0)
Monocytes Relative: 8 %
Neutro Abs: 8.6 10*3/uL — ABNORMAL HIGH (ref 1.7–7.7)
Neutrophils Relative %: 62 %
Platelets: 208 10*3/uL (ref 150–400)
RBC: 3.38 MIL/uL — ABNORMAL LOW (ref 3.87–5.11)
RDW: 14.5 % (ref 11.5–15.5)
WBC: 13.7 10*3/uL — ABNORMAL HIGH (ref 4.0–10.5)
nRBC: 0 % (ref 0.0–0.2)

## 2020-04-17 LAB — TSH: TSH: 1.827 u[IU]/mL (ref 0.350–4.500)

## 2020-04-17 LAB — URINALYSIS, ROUTINE W REFLEX MICROSCOPIC
Bacteria, UA: NONE SEEN
Bilirubin Urine: NEGATIVE
Glucose, UA: 150 mg/dL — AB
Ketones, ur: NEGATIVE mg/dL
Leukocytes,Ua: NEGATIVE
Nitrite: NEGATIVE
Protein, ur: 100 mg/dL — AB
Specific Gravity, Urine: 1.008 (ref 1.005–1.030)
pH: 6 (ref 5.0–8.0)

## 2020-04-17 LAB — BLOOD GAS, VENOUS
Acid-Base Excess: 4 mmol/L — ABNORMAL HIGH (ref 0.0–2.0)
Bicarbonate: 27.3 mmol/L (ref 20.0–28.0)
FIO2: 28
O2 Saturation: 72.9 %
Patient temperature: 37
pCO2, Ven: 49.3 mmHg (ref 44.0–60.0)
pH, Ven: 7.384 (ref 7.250–7.430)
pO2, Ven: 40.9 mmHg (ref 32.0–45.0)

## 2020-04-17 LAB — GLUCOSE, CAPILLARY: Glucose-Capillary: 193 mg/dL — ABNORMAL HIGH (ref 70–99)

## 2020-04-17 LAB — BRAIN NATRIURETIC PEPTIDE: B Natriuretic Peptide: 650 pg/mL — ABNORMAL HIGH (ref 0.0–100.0)

## 2020-04-17 LAB — TROPONIN I (HIGH SENSITIVITY)
Troponin I (High Sensitivity): 16 ng/L (ref <18)
Troponin I (High Sensitivity): 17 ng/L (ref <18)

## 2020-04-17 LAB — SARS CORONAVIRUS 2 BY RT PCR (HOSPITAL ORDER, PERFORMED IN ~~LOC~~ HOSPITAL LAB): SARS Coronavirus 2: NEGATIVE

## 2020-04-17 LAB — PROCALCITONIN: Procalcitonin: <0.1 ng/mL

## 2020-04-17 LAB — AMMONIA: Ammonia: 12 umol/L (ref 9–35)

## 2020-04-17 LAB — LIPASE, BLOOD: Lipase: 35 U/L (ref 11–51)

## 2020-04-17 MED ORDER — QUETIAPINE FUMARATE 25 MG PO TABS
25.0000 mg | ORAL_TABLET | Freq: Every day | ORAL | Status: DC
  Filled 2020-04-17: qty 1

## 2020-04-17 MED ORDER — MIDODRINE HCL 5 MG PO TABS
10.0000 mg | ORAL_TABLET | Freq: Three times a day (TID) | ORAL | Status: DC
  Administered 2020-04-18 (×2): 10 mg via ORAL
  Filled 2020-04-17 (×7): qty 2

## 2020-04-17 MED ORDER — SODIUM CHLORIDE 0.9% FLUSH: 3.0000 mL | INTRAVENOUS | Status: DC | PRN

## 2020-04-17 MED ORDER — ISOSORBIDE MONONITRATE ER 60 MG PO TB24
30.0000 mg | ORAL_TABLET | Freq: Every morning | ORAL | Status: DC
  Administered 2020-04-18: 30 mg via ORAL
  Filled 2020-04-17 (×3): qty 1

## 2020-04-17 MED ORDER — INSULIN GLARGINE 100 UNIT/ML ~~LOC~~ SOLN
24.0000 [IU] | Freq: Every day | SUBCUTANEOUS | Status: DC
  Administered 2020-04-17: 24 [IU] via SUBCUTANEOUS
  Filled 2020-04-17 (×2): qty 0.24

## 2020-04-17 MED ORDER — ONDANSETRON HCL 4 MG/2ML IJ SOLN: 4.0000 mg | Freq: Four times a day (QID) | INTRAMUSCULAR | Status: DC | PRN

## 2020-04-17 MED ORDER — ACETAMINOPHEN 325 MG PO TABS: 650.0000 mg | ORAL_TABLET | ORAL | Status: DC | PRN

## 2020-04-17 MED ORDER — INSULIN ASPART 100 UNIT/ML ~~LOC~~ SOLN
0.0000 [IU] | Freq: Three times a day (TID) | SUBCUTANEOUS | Status: DC
  Administered 2020-04-18: 2 [IU] via SUBCUTANEOUS
  Administered 2020-04-18: 7 [IU] via SUBCUTANEOUS

## 2020-04-17 MED ORDER — SODIUM CHLORIDE 0.9 % IV SOLN
250.0000 mL | INTRAVENOUS | Status: DC | PRN
  Administered 2020-04-17: 250 mL via INTRAVENOUS

## 2020-04-17 MED ORDER — ADULT MULTIVITAMIN W/MINERALS CH
1.0000 | ORAL_TABLET | Freq: Every day | ORAL | Status: DC
  Administered 2020-04-18: 1 via ORAL
  Filled 2020-04-17 (×2): qty 1

## 2020-04-17 MED ORDER — FUROSEMIDE 10 MG/ML IJ SOLN
40.0000 mg | Freq: Two times a day (BID) | INTRAMUSCULAR | Status: DC
  Filled 2020-04-17: qty 4

## 2020-04-17 MED ORDER — SODIUM CHLORIDE 0.9% FLUSH
3.0000 mL | Freq: Two times a day (BID) | INTRAVENOUS | Status: DC
  Administered 2020-04-17 – 2020-04-18 (×2): 3 mL via INTRAVENOUS

## 2020-04-17 MED ORDER — METOPROLOL SUCCINATE ER 50 MG PO TB24
200.0000 mg | ORAL_TABLET | Freq: Every morning | ORAL | Status: DC
  Administered 2020-04-18: 200 mg via ORAL
  Filled 2020-04-17 (×2): qty 4

## 2020-04-17 MED ORDER — ATORVASTATIN CALCIUM 20 MG PO TABS
20.0000 mg | ORAL_TABLET | Freq: Every day | ORAL | Status: DC
  Administered 2020-04-17 – 2020-04-18 (×2): 20 mg via ORAL
  Filled 2020-04-17 (×2): qty 1

## 2020-04-17 MED ORDER — ADULT MULTIVITAMIN W/MINERALS CH: 1.0000 | ORAL_TABLET | Freq: Every day | ORAL | Status: DC

## 2020-04-17 MED ORDER — SODIUM CHLORIDE 0.9 % IV SOLN
100.0000 mg | Freq: Two times a day (BID) | INTRAVENOUS | Status: DC
  Administered 2020-04-17 – 2020-04-18 (×3): 100 mg via INTRAVENOUS
  Filled 2020-04-17 (×7): qty 100

## 2020-04-17 MED ORDER — ZOLPIDEM TARTRATE 5 MG PO TABS: 2.5000 mg | ORAL_TABLET | Freq: Every evening | ORAL | Status: DC | PRN

## 2020-04-17 MED ORDER — ENOXAPARIN SODIUM 30 MG/0.3ML ~~LOC~~ SOLN
30.0000 mg | SUBCUTANEOUS | Status: DC
  Administered 2020-04-17: 30 mg via SUBCUTANEOUS
  Filled 2020-04-17: qty 0.3

## 2020-04-17 MED ORDER — HYDRALAZINE HCL 25 MG PO TABS
25.0000 mg | ORAL_TABLET | Freq: Three times a day (TID) | ORAL | Status: DC
  Administered 2020-04-17 – 2020-04-18 (×3): 25 mg via ORAL
  Filled 2020-04-17 (×3): qty 1

## 2020-04-17 MED ORDER — MAGNESIUM SULFATE 2 GM/50ML IV SOLN
2.0000 g | Freq: Once | INTRAVENOUS | Status: AC
  Administered 2020-04-17: 2 g via INTRAVENOUS
  Filled 2020-04-17: qty 50

## 2020-04-17 MED ORDER — PROCHLORPERAZINE EDISYLATE 10 MG/2ML IJ SOLN: 5.0000 mg | INTRAMUSCULAR | Status: DC | PRN

## 2020-04-17 MED ORDER — FUROSEMIDE 10 MG/ML IJ SOLN
40.0000 mg | Freq: Once | INTRAMUSCULAR | Status: AC
  Administered 2020-04-17: 13:00:00 40 mg via INTRAVENOUS
  Filled 2020-04-17: qty 4

## 2020-04-17 MED ORDER — ATORVASTATIN CALCIUM 20 MG PO TABS: 20.0000 mg | ORAL_TABLET | Freq: Every day | ORAL | Status: DC

## 2020-04-17 MED ORDER — ASPIRIN EC 325 MG PO TBEC
325.0000 mg | DELAYED_RELEASE_TABLET | Freq: Every morning | ORAL | Status: DC
  Administered 2020-04-18: 325 mg via ORAL
  Filled 2020-04-17: qty 1

## 2020-04-17 MED ORDER — FLUDROCORTISONE ACETATE 0.1 MG PO TABS
200.0000 ug | ORAL_TABLET | Freq: Every day | ORAL | Status: DC
  Administered 2020-04-17 – 2020-04-18 (×2): 200 ug via ORAL
  Filled 2020-04-17 (×5): qty 2

## 2020-04-17 MED ORDER — QUETIAPINE FUMARATE 25 MG PO TABS: 25.0000 mg | ORAL_TABLET | Freq: Every day | ORAL | Status: DC

## 2020-04-17 MED ORDER — TRAMADOL HCL 50 MG PO TABS: 50.0000 mg | ORAL_TABLET | Freq: Two times a day (BID) | ORAL | Status: DC | PRN

## 2020-04-17 MED ORDER — LABETALOL HCL 5 MG/ML IV SOLN
10.0000 mg | INTRAVENOUS | Status: DC | PRN
  Administered 2020-04-17: 10 mg via INTRAVENOUS
  Filled 2020-04-17: qty 4

## 2020-04-17 NOTE — TOC Initial Note (Signed)
Transition of Care [facility]) - Initial/Assessment Note   Patient Details  Name: Victoria Winters MRN: [id number] Date of Birth: 17-Mar-1951  Transition of Care [facility]) CM/SW Contact:    Sherie Don, LCSW Phone Number: 04/17/2020, 4:32 PM  Clinical Narrative: Patient is a 69 year old female who was admitted for acute and chronic respiratory failure with hypoxia. TOC received CHF consult. Per patient, she lives in a single family home with her sister, Willeen Cass. Patient currently has a shower chair, walker, and electric scooter at home. CSW asked about heart healthy diet and salt intake. Patient stated, "I think so" in terms of being consistent with the heart healthy diet and denies any salt intake. Patient reported she only drinks fluids with her meals, but does not weigh herself regularly as she was not aware she may be retaining fluid. TOC to follow for possible needs.               Expected Discharge Plan: Home/Self Care Barriers to Discharge: Continued Medical Work up  Patient Goals and CMS Choice Patient states their goals for this hospitalization and ongoing recovery are:: Return home CMS Medicare.gov Compare Post Acute Care list provided to:: Patient Choice offered to / list presented to : Patient  Expected Discharge Plan and Services Expected Discharge Plan: Home/Self Care Living arrangements for the past 2 months: Single Family Home  Prior Living Arrangements/Services Living arrangements for the past 2 months: Single Family Home Lives with:: Siblings([name] Tamala Julian (sister) PH: [phone]) Patient language and need for interpreter reviewed:: Yes Do you feel safe going back to the place where you live?: Yes      Need for Family Participation in Patient Care: No (Comment) Care giver support system in place?: Yes (comment)([name] Tamala Julian (sister); Candyce Churn (daughter)) Current home services: DME(Shower chair, walker, electric scooter) Criminal Activity/Legal Involvement  Pertinent to Current Situation/Hospitalization: No - Comment as needed  Activities of Daily Living Home Assistive Devices/Equipment: Walker (specify type), CBG Meter, Eyeglasses ADL Screening (condition at time of admission) Patient's cognitive ability adequate to safely complete daily activities?: No Is the patient deaf or have difficulty hearing?: No Does the patient have difficulty seeing, even when wearing glasses/contacts?: No Does the patient have difficulty concentrating, remembering, or making decisions?: Yes Patient able to express need for assistance with ADLs?: Yes Does the patient have difficulty dressing or bathing?: No Independently performs ADLs?: No Communication: Independent Dressing (OT): Needs assistance Is this a change from baseline?: Pre-admission baseline Grooming: Needs assistance Is this a change from baseline?: Pre-admission baseline Feeding: Independent Bathing: Needs assistance Is this a change from baseline?: Pre-admission baseline Toileting: Needs assistance Is this a change from baseline?: Pre-admission baseline In/Out Bed: Needs assistance Is this a change from baseline?: Pre-admission baseline Walks in Home: Needs assistance Is this a change from baseline?: Pre-admission baseline Does the patient have difficulty walking or climbing stairs?: Yes Weakness of Legs: Both Weakness of Arms/Hands: Both  Emotional Assessment Appearance:: Appears stated age Attitude/Demeanor/Rapport: Engaged Affect (typically observed): Accepting Orientation: : Oriented to Self, Oriented to Place, Oriented to  Time, Oriented to Situation Alcohol / Substance Use: Not Applicable  Admission diagnosis:  Acute and chronic respiratory failure with hypoxia ([place]) [J96.21] Patient Active Problem List   Diagnosis Date Noted  . Acute and chronic respiratory failure with hypoxia ([place]) 04/17/2020  . DNR (do not resuscitate) 04/17/2020  . Leukocytosis 04/17/2020  . Anemia in  chronic kidney disease (CKD) 04/17/2020  . Closed fracture of distal end of right  fibula with routine healing 11/23/2019  . Palliative care by specialist   . Goals of care, counseling/discussion   . History of CVA (cerebrovascular accident)   . Acute encephalopathy 07/21/2019  . Fall 07/20/2019  . 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 Brain aneurysm 07/20/2019  . DM (diabetes mellitus) ([place]) 07/20/2019  . Bilateral fibular fractures 07/20/2019  . CVA (cerebral vascular accident) ([place]) 07/20/2019  . Hallucination 07/20/2019   PCP:  Alfonse Flavors, MD Pharmacy:   [facility] [address] [place] [address] Phone: [phone] Fax: [phone]  Readmission Risk Interventions Readmission Risk Prevention Plan 07/28/2019 07/21/2019  Transportation Screening - Complete  PCP or Specialist Appt within 3-5 Days Not Complete -  Not Complete comments Pt going to SNF rehab where the SNF MD will follow -  Medication Review (RN CM) - Complete  HRI or Home Care Consult Not Complete -  [place] or Home Care Consult comments Pt going to SNF rehab -  Social Work Consult for [place] Planning/Counseling Complete -  Palliative Care Screening Not Applicable -  Medication Review (RN Care Manager) Complete -

## 2020-04-17 NOTE — H&P (Signed)
History and Physical  [facility]  Victoria Winters WCH:[id number] DOB: [birth year] DOA: 04/17/2020  PCP: Alfonse Flavors, MD  Patient coming from: Home, lives with sister (caretaker)  I have personally briefly reviewed patient's old medical records in [place]  Chief Complaint: Shortness of breath  HPI: Victoria Winters is a 69 y.o. 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  Pt had been very weak since last night.  Also she noticed that the patient was wheezing and had been complaining of shortness of breath.  Pt denies chest pain.  Pt has had no fever or chills.    ED Course: pt was hypoxic on arrival and placed on supplemental oxygen, Pt was noted to have an elevated BNP and noted to have a leukocytosis with WBC of 13.7.  Pt was afebrile.  CXR with findings of infiltrate versus edema.  Pt was given IV lasix and started on doxycycline in ED.  Sars 2 coronavirus test is pending.  Ammonia test is normal at 12.  Glucose was elevated at 215.    Review of Systems: As per HPI otherwise 10 point review of systems negative.   Past Medical History:  Diagnosis Date  . Brain aneurysm   . Diabetes mellitus without complication ([place])   . Hallucinations    visual and auditory  . Memory loss   . Recurrent UTI   . Renal disorder   . Stage 5 chronic kidney disease ([place])   . Stroke [facility])     Past Surgical History:  Procedure Laterality Date  . CHOLECYSTECTOMY    . SKIN GRAFT       reports that she has quit smoking. She has never used smokeless tobacco. She reports current alcohol use. She reports that she does not use drugs.  Allergies  Allergen Reactions  . Penicillins Anaphylaxis    Did it involve swelling of the face/tongue/throat, SOB,  or low BP? Yes Did it involve sudden or severe rash/hives, skin peeling, or any reaction on the inside of your mouth or nose? Unknown Did you need to seek medical attention at a hospital or doctor's office? Unknown When did it last happen?Within past 10 years If all above answers are "NO", may proceed with cephalosporin use.   . Chicken Allergy   . Eggs Or Egg-Derived Products Swelling  . Milk-Related Compounds   . Nickel   . Other     Black beans, cats, dogs, grass clippings  . Wheat Bran     Family History  Problem Relation Age of Onset  . Heart disease Mother   . Diabetes Mellitus II Mother   . Heart disease Father   . Diabetes Father   . Heart disease Brother   . Cancer Other      Prior to Admission medications   Medication Sig Start Date End Date Taking? Authorizing Provider  aspirin EC 325 MG tablet Take 1 tablet (325 mg total) by mouth every morning. 07/29/19  Yes Memon, Jolaine Artist, MD  atorvastatin (LIPITOR) 20 MG tablet Take 20 mg by mouth daily. 09/29/19  Yes [provider]  cetirizine (ZYRTEC) 5 MG tablet Take 5 mg by mouth daily.   Yes [provider]  doxycycline (VIBRA-TABS) 100 MG tablet Take 100 mg by mouth 2 (two)  times daily. 14 day course starting on 10/23/2019 10/23/19  Yes [provider]  escitalopram (LEXAPRO) 10 MG tablet Take 10 mg by mouth every morning.   Yes [provider]  fludrocortisone (FLORINEF) 0.1 MG tablet Take 2 tablets by mouth daily. 03/19/20  Yes [provider]  furosemide (LASIX) 40 MG tablet Take 40 mg by mouth daily. 10/03/19  Yes [provider]  hydrALAZINE (APRESOLINE) 25 MG tablet Take 25 mg by mouth 3 (three) times daily.   Yes [provider]  insulin glargine (LANTUS) 100 UNIT/ML injection Inject 0.1 mLs (10 Units total) into the skin at bedtime. Patient taking differently: Inject 27 Units into the skin at bedtime.  07/29/19  Yes Kathie Dike, MD  insulin lispro  (HUMALOG) 100 UNIT/ML injection Inject 1-17 Units into the skin 3 (three) times daily before meals. Sliding scale starting at; 180-220= 4 units 221-260= 6 units 261-300= 8 units 301-340=10units 341-380=12units   Yes [provider]  isosorbide mononitrate (IMDUR) 30 MG 24 hr tablet Take 30 mg by mouth every morning.   Yes [provider]  linagliptin (TRADJENTA) 5 MG TABS tablet Take 5 mg by mouth every morning.   Yes [provider]  loratadine (CLARITIN) 10 MG tablet Take 10 mg by mouth every morning.   Yes [provider]  Melatonin 1 MG TABS Take 5 mg by mouth at bedtime.    Yes [provider]  metoprolol succinate (TOPROL-XL) 100 MG 24 hr tablet Take 200 mg by mouth every morning.    Yes [provider]  midodrine (PROAMATINE) 5 MG tablet Take 10 mg by mouth in the morning, at noon, and at bedtime. 02/23/20  Yes [provider]  Multiple Vitamin (MULTIVITAMIN WITH MINERALS) TABS tablet Take 1 tablet by mouth daily.   Yes [provider]  pregabalin (LYRICA) 75 MG capsule  10/23/19  Yes [provider]  QUEtiapine (SEROQUEL) 25 MG tablet Take 1 tablet (25 mg total) by mouth at bedtime. 07/29/19  Yes Kathie Dike, MD  traMADol (ULTRAM) 50 MG tablet Take 1 tablet (50 mg total) by mouth every 6 (six) hours as needed. 09/05/19   Lily Kocher, PA-C    Physical Exam: Vitals:   04/17/20 1031 04/17/20 1032 04/17/20 1100  BP: (!) 180/58  (!) 183/68  Pulse: 64  64  Resp: 20  (!) 25  Temp: 98.6 F (37 C)    TempSrc: Oral    SpO2: 98%  96%  Weight:  99.8 kg   Height:  5\' 1"  (1.549 m)     Constitutional: NAD, calm, comfortable Eyes: PERRL, lids and conjunctivae normal ENMT: Mucous membranes are moist. Posterior pharynx clear of any exudate or lesions.Normal dentition.  Neck: normal, supple, no masses, no thyromegaly Respiratory: clear to auscultation bilaterally, no wheezing, no crackles. Normal respiratory  effort. No accessory muscle use.  Cardiovascular: Regular rate and rhythm, no murmurs / rubs / gallops. No extremity edema. 2+ pedal pulses. No carotid bruits.  Abdomen: no tenderness, no masses palpated. No hepatosplenomegaly. Bowel sounds positive.  Musculoskeletal: no clubbing / cyanosis. No joint deformity upper and lower extremities. Good ROM, no contractures. Normal muscle tone.  Skin: no rashes, lesions, ulcers. No induration Neurologic: CN 2-12 grossly intact. Sensation intact, DTR normal. Strength 5/5 in all 4.  Psychiatric: Normal judgment and insight. Alert and oriented x 3. Normal mood.   Labs on Admission: I have personally reviewed following labs and imaging studies  CBC: Recent Labs  Lab  04/17/20 1039  WBC 13.7*  NEUTROABS 8.6*  HGB 9.3*  HCT 29.9*  MCV 88.5  PLT 536   Basic Metabolic Panel: Recent Labs  Lab 04/17/20 1039  NA 141  K 4.0  CL 104  CO2 27  GLUCOSE 215*  BUN 52*  CREATININE 2.49*  CALCIUM 8.2*   GFR: Estimated Creatinine Clearance: 23.1 mL/min (A) (by C-G formula based on SCr of 2.49 mg/dL (H)). Liver Function Tests: Recent Labs  Lab 04/17/20 1039  AST 16  ALT 17  ALKPHOS 93  BILITOT 1.0  PROT 6.5  ALBUMIN 3.3*   Recent Labs  Lab 04/17/20 1039  LIPASE 35   Recent Labs  Lab 04/17/20 1125  AMMONIA 12   Coagulation Profile: No results for input(s): INR, PROTIME in the last 168 hours. Cardiac Enzymes: No results for input(s): CKTOTAL, CKMB, CKMBINDEX, TROPONINI in the last 168 hours. BNP (last 3 results) No results for input(s): PROBNP in the last 8760 hours. HbA1C: No results for input(s): HGBA1C in the last 72 hours. CBG: No results for input(s): GLUCAP in the last 168 hours. Lipid Profile: No results for input(s): CHOL, HDL, LDLCALC, TRIG, CHOLHDL, LDLDIRECT in the last 72 hours. Thyroid Function Tests: No results for input(s): TSH, T4TOTAL, FREET4, T3FREE, THYROIDAB in the last 72 hours. Anemia Panel: No results for  input(s): VITAMINB12, FOLATE, FERRITIN, TIBC, IRON, RETICCTPCT in the last 72 hours. Urine analysis:    Component Value Date/Time   COLORURINE YELLOW 10/23/2019 1427   APPEARANCEUR CLEAR 10/23/2019 1427   LABSPEC 1.008 10/23/2019 1427   PHURINE 5.0 10/23/2019 1427   GLUCOSEU NEGATIVE 10/23/2019 1427   HGBUR SMALL (A) 10/23/2019 1427   BILIRUBINUR NEGATIVE 10/23/2019 1427   KETONESUR NEGATIVE 10/23/2019 1427   PROTEINUR NEGATIVE 10/23/2019 1427   NITRITE NEGATIVE 10/23/2019 1427   LEUKOCYTESUR SMALL (A) 10/23/2019 1427    Radiological Exams on Admission: CT Head Wo Contrast  Result Date: 04/17/2020 CLINICAL DATA:  Ataxia, stroke suspected. Additional history provided: Increased confusion, weakness since 11 p.m. last night, history of stroke, short-term memory loss at baseline EXAM: CT HEAD WITHOUT CONTRAST TECHNIQUE: Contiguous axial images were obtained from the base of the skull through the vertex without intravenous contrast. COMPARISON:  Brain MRI 07/21/2019, head CT 07/20/2019. FINDINGS: Brain: Redemonstrated chronic lacunar infarcts within the cerebral white matter and basal ganglia bilaterally. A known chronic infarct within the paramedian right pons was better appreciated on prior MRI 07/21/2019. Stable, mild generalized parenchymal atrophy. There is no acute intracranial hemorrhage. No demarcated cortical infarct. No extra-axial fluid collection. No evidence of intracranial mass. No midline shift. Partially empty sella turcica. Vascular: No hyperdense vessel.  Atherosclerotic calcifications. Skull: Normal. Negative for fracture or focal lesion. Sinuses/Orbits: Visualized orbits show no acute finding. Minimal scattered paranasal sinus mucosal thickening. No significant mastoid effusion at the imaged levels. IMPRESSION: 1. No CT evidence of acute intracranial abnormality. 2. Redemonstrated chronic lacunar infarcts within the bilateral cerebral white matter and basal ganglia. 3. A known  chronic infarct within the paramedian right pons was better appreciated on prior MRI 07/21/2019. 4. Minimal scattered paranasal sinus mucosal thickening. Electronically Signed   By: Kellie Simmering DO   On: 04/17/2020 11:23   DG Chest Portable 1 View  Result Date: 04/17/2020 CLINICAL DATA:  Shortness of breath. Additional provided: Wheezing, increased confusion, weakness since 11 p.m. last night, pending COVID test. EXAM: PORTABLE CHEST 1 VIEW COMPARISON:  Chest radiograph 09/05/2019 FINDINGS: Unchanged cardiomegaly. Central pulmonary vascular congestion. Ill-defined opacities at the  bilateral lung bases. No definite pleural effusion or evidence of pneumothorax. 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 IMPRESSION: Unchanged cardiomegaly.  Central pulmonary vascular congestion. Ill-defined opacities at the bilateral lung bases may reflect edema, atelectasis or pneumonia. Electronically Signed   By: Kellie Simmering DO   On: 04/17/2020 11:12    EKG: Independently reviewed. Normal sinus rhythm, prolonged QT interval.   Assessment/Plan Principal Problem:   Acute and chronic respiratory failure with hypoxia (HCC) Active Problems:   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   Brain aneurysm   DM (diabetes mellitus) ([place])   Acute encephalopathy   Goals of care, counseling/discussion   DNR (do not resuscitate)   Leukocytosis   Anemia in chronic kidney disease (CKD)   1. Acute respiratory failure with hypoxia - Pt requiring supplemental oxygen.   Covid test pending.  I suspect some underlying CHF exacerbation versus pneumonia.  Continue current management pending Covid test.  Continue IV lasix for diuresis.  2. Acute CHF exacerbation - continue IV lasix for diuresis.  Monitor I/O.  Monitor weights.  3. Acute encephalopathy - I question if this is medication induced, hold sedative medications for now.  MRI brain ordered.  Followup results.  4. Leukocytosis -  Possibly due to pneumonia, treating as above, follow CBC with diff.  5. Type 2 DM - resume home basal insulin, added prandial coverage and CBG testing and SSI coverage. 6. Anemia in CKD - stable, follow. 7. DNR - resume DNR orders in hospital.    DVT prophylaxis: lovenox   Code Status: DNR   Family Communication: sister at bedside   Disposition Plan: return home at [place] called:   Admission status: INP    Irwin Brakeman MD [facility] How to contact the [facility] Attending or Consulting provider [place] or covering provider during after hours [place], for this patient?  1. Check the care team in [facility] and look for a) attending/consulting TR[facility] provider listed and b) the [facility] team listed 2. Log into [email or web address] and use 's universal password to access. If you do not have the password, please contact the hospital operator. 3. Locate the [facility] provider you are looking for under [facility] and page to a number that you can be directly reached. 4. If you still have difficulty reaching the provider, please page the [facility] (Director on Call) for the Hospitalists listed on amion for assistance.   If 7PM-7AM, please contact night-coverage [email or web address] Password [facility]  04/17/2020, 1:54 PM

## 2020-04-17 NOTE — ED Provider Notes (Signed)
Emergency Department Provider Note   I have reviewed the triage vital signs and the nursing notes.   HISTORY  Chief Complaint Altered Mental Status   HPI Victoria Winters is a 69 y.o. female with PMH of CKD, DM, short term memory deficits at baseline, and prior CVA presents to the ED for evaluation of shortness of breath and increased confusion.  Symptoms began last night.  The patient's caregiver is at bedside and states that she seemed more dyspneic and had trouble lying flat last night.  She has not had fevers or shaking chills.  Patient does describe some central to just left of center chest discomfort that is nonradiating.  She describes it as a tightness.  No vomiting but did have significant nausea and poor appetite this morning.  The patient was ultimately able to get to sleep last night but when symptoms continued today they initially presented to urgent care.  There was a wait there at urgent care and so patient decided to present on to the emergency department.  No diarrhea.  Patient denies abdominal or back pain.  No sudden severe headaches. No unilateral weakness/numbness.   Past Medical History:  Diagnosis Date   Brain aneurysm    Diabetes mellitus without complication (HCC)    Hallucinations    visual and auditory   Memory loss    Recurrent UTI    Renal disorder    Stage 5 chronic kidney disease ([place])    Stroke [facility])     Patient Active Problem List   Diagnosis Date Noted   Acute and chronic respiratory failure with hypoxia ([place]) 04/17/2020   DNR (do not resuscitate) 04/17/2020   Leukocytosis 04/17/2020   Anemia in chronic kidney disease (CKD) 04/17/2020   Closed fracture of distal end of right fibula with routine healing 11/23/2019   Palliative care by specialist    Goals of care, counseling/discussion    History of CVA (cerebrovascular accident)    Acute encephalopathy 07/21/2019   Fall 07/20/2019   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 07/20/2019    Brain aneurysm 07/20/2019   DM (diabetes mellitus) ([place]) 07/20/2019   Bilateral fibular fractures 07/20/2019   CVA (cerebral vascular accident) ([place]) 07/20/2019   Hallucination 07/20/2019    Past Surgical History:  Procedure Laterality Date   CHOLECYSTECTOMY     SKIN GRAFT      Allergies Penicillins, Chicken allergy, Contrast media [iodinated diagnostic agents], Eggs or egg-derived products, Milk-related compounds, Nickel, Oatmeal, Other, and Wheat bran  Family History  Problem Relation Age of Onset   Heart disease Mother    Diabetes Mellitus II Mother    Heart disease Father    Diabetes Father    Heart disease Brother    Cancer Other     Social History Social History   Tobacco Use   Smoking status: Former Smoker   Smokeless tobacco: Never Used  Substance Use Topics   Alcohol use: Yes    Comment: rarely   Drug use: Never    Review of Systems  Constitutional: No fever/chills. Increased confusion per family at bedside.  Eyes: No visual changes. ENT: No sore throat. Cardiovascular: Positive chest pain. Respiratory: Positive shortness of breath. Gastrointestinal: No abdominal pain. Positive nausea, no vomiting.  No diarrhea.  No constipation. Positive poor appetite.  Genitourinary: Negative for dysuria. Musculoskeletal: Negative for back pain. Skin: Negative for rash. Neurological: Negative for headaches, focal weakness or numbness.  10-point ROS otherwise negative.  ____________________________________________   PHYSICAL EXAM:  VITAL SIGNS:  ED Triage Vitals  Enc Vitals Group     BP 04/17/20 1031 (!) 180/58     Pulse Rate 04/17/20 1031 64     Resp 04/17/20 1031 20     Temp 04/17/20 1031 98.6 F (37 C)     Temp Source 04/17/20 1031 Oral     SpO2 04/17/20 1031 98 %     Weight 04/17/20 1032 220 lb (99.8 kg)     Height 04/17/20 1032 5\' 1"  (1.549 m)   Constitutional: Alert with mild confusion. Well appearing and in no acute  distress. Eyes: Conjunctivae are normal. PERRL.  Head: Atraumatic. Nose: No congestion/rhinnorhea. Mouth/Throat: Mucous membranes are moist.  Neck: No stridor.  Cardiovascular: Normal rate, regular rhythm. Good peripheral circulation. Grossly normal heart sounds.   Respiratory: Slight increased respiratory effort.  No retractions. Lungs diminished at the bases. No wheezing.  Gastrointestinal: Soft and nontender. No distention.  Musculoskeletal: No lower extremity tenderness with trace bilateral pitting edema. No gross deformities of extremities. Neurologic:  Normal speech and language.  No facial asymmetry or sensory deficit.  Patient with equal strength in the bilateral upper and lower extremities without pronator drift. Normal sensation in the bilateral upper and lower extremities.  Skin:  Skin is warm, dry and intact. No rash noted.   ____________________________________________   LABS (all labs ordered are listed, but only abnormal results are displayed)  Labs Reviewed  COMPREHENSIVE METABOLIC PANEL - Abnormal; Notable for the following components:      Result Value   Glucose, Bld 215 (*)    BUN 52 (*)    Creatinine, Ser 2.49 (*)    Calcium 8.2 (*)    Albumin 3.3 (*)    GFR calc non Af Amer 19 (*)    GFR calc Af Amer 22 (*)    All other components within normal limits  CBC WITH DIFFERENTIAL/PLATELET - Abnormal; Notable for the following components:   WBC 13.7 (*)    RBC 3.38 (*)    Hemoglobin 9.3 (*)    HCT 29.9 (*)    Neutro Abs 8.6 (*)    Monocytes Absolute 1.1 (*)    Abs Immature Granulocytes 0.08 (*)    All other components within normal limits  BRAIN NATRIURETIC PEPTIDE - Abnormal; Notable for the following components:   B Natriuretic Peptide 650.0 (*)    All other components within normal limits  BLOOD GAS, VENOUS - Abnormal; Notable for the following components:   Acid-Base Excess 4.0 (*)    All other components within normal limits  URINALYSIS, ROUTINE W  REFLEX MICROSCOPIC - Abnormal; Notable for the following components:   Glucose, UA 150 (*)    Hgb urine dipstick SMALL (*)    Protein, ur 100 (*)    All other components within normal limits  SARS CORONAVIRUS 2 BY RT PCR (HOSPITAL ORDER, [place] [facility])  URINE CULTURE  LIPASE, BLOOD  AMMONIA  PROCALCITONIN  BASIC METABOLIC PANEL  MAGNESIUM  TSH  CBC WITH DIFFERENTIAL/PLATELET  HEMOGLOBIN A1C  TROPONIN I (HIGH SENSITIVITY)  TROPONIN I (HIGH SENSITIVITY)   ____________________________________________  EKG   EKG Interpretation  Date/Time:  Wednesday Apr 17 2020 10:32:22 EDT Ventricular Rate:  64 PR Interval:    QRS Duration: 96 QT Interval:  494 QTC Calculation: 510 R Axis:   104 Text Interpretation: Sinus rhythm Anteroseptal infarct, age indeterminate Prolonged QT interval No STEMI Confirmed by Nanda Quinton [phone]) on 04/17/2020 11:02:40 AM  ____________________________________________  RADIOLOGY  CT Head Wo Contrast  Result Date: 04/17/2020 CLINICAL DATA:  Ataxia, stroke suspected. Additional history provided: Increased confusion, weakness since 11 p.m. last night, history of stroke, short-term memory loss at baseline EXAM: CT HEAD WITHOUT CONTRAST TECHNIQUE: Contiguous axial images were obtained from the base of the skull through the vertex without intravenous contrast. COMPARISON:  Brain MRI 07/21/2019, head CT 07/20/2019. FINDINGS: Brain: Redemonstrated chronic lacunar infarcts within the cerebral white matter and basal ganglia bilaterally. A known chronic infarct within the paramedian right pons was better appreciated on prior MRI 07/21/2019. Stable, mild generalized parenchymal atrophy. There is no acute intracranial hemorrhage. No demarcated cortical infarct. No extra-axial fluid collection. No evidence of intracranial mass. No midline shift. Partially empty sella turcica. Vascular: No hyperdense vessel.  Atherosclerotic calcifications. Skull:  Normal. Negative for fracture or focal lesion. Sinuses/Orbits: Visualized orbits show no acute finding. Minimal scattered paranasal sinus mucosal thickening. No significant mastoid effusion at the imaged levels. IMPRESSION: 1. No CT evidence of acute intracranial abnormality. 2. Redemonstrated chronic lacunar infarcts within the bilateral cerebral white matter and basal ganglia. 3. A known chronic infarct within the paramedian right pons was better appreciated on prior MRI 07/21/2019. 4. Minimal scattered paranasal sinus mucosal thickening. Electronically Signed   By: Kellie Simmering DO   On: 04/17/2020 11:23   MR BRAIN WO CONTRAST  Result Date: 04/17/2020 CLINICAL DATA:  Encephalopathy EXAM: MRI HEAD WITHOUT CONTRAST TECHNIQUE: Multiplanar, multiecho pulse sequences of the brain and surrounding structures were obtained without intravenous contrast. COMPARISON:  07/21/2019 FINDINGS: Brain: There is no acute infarction or intracranial hemorrhage. There is no intracranial mass, mass effect, or edema. There is no hydrocephalus or extra-axial fluid collection. There are multiple chronic small vessel infarcts including involvement of bilateral basal ganglia and adjacent white matter as well as the right pons. There is hemosiderin deposition associated with the left basal ganglia infarct. Additional patchy T2 hyperintensity in the supratentorial white matter is nonspecific but may reflect mild chronic microvascular ischemic changes. Ventricles and sulci are within normal limits in size and configuration. Vascular: Major vessel flow voids at the skull base are preserved. Skull and upper cervical spine: Normal marrow signal is preserved. Sinuses/Orbits: Trace mucosal thickening. Bilateral lens replacements. Other: Incidental note is made of a partially empty sella. Trace mastoid fluid opacification. IMPRESSION: No evidence of recent infarction, hemorrhage, or mass. Stable chronic findings detailed above. Electronically  Signed   By: Macy Mis M.D.   On: 04/17/2020 14:14   DG Chest Portable 1 View  Result Date: 04/17/2020 CLINICAL DATA:  Shortness of breath. Additional provided: Wheezing, increased confusion, weakness since 11 p.m. last night, pending COVID test. EXAM: PORTABLE CHEST 1 VIEW COMPARISON:  Chest radiograph 09/05/2019 FINDINGS: Unchanged cardiomegaly. Central pulmonary vascular congestion. Ill-defined opacities at the bilateral lung bases. No definite pleural effusion or evidence of pneumothorax. 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 IMPRESSION: Unchanged cardiomegaly.  Central pulmonary vascular congestion. Ill-defined opacities at the bilateral lung bases may reflect edema, atelectasis or pneumonia. Electronically Signed   By: Kellie Simmering DO   On: 04/17/2020 11:12    ____________________________________________   PROCEDURES  Procedure(s) performed:   Procedures  CRITICAL CARE Performed by: Margette Fast Total critical care time: 35 minutes Critical care time was exclusive of separately billable procedures and treating other patients. Critical care was necessary to treat or prevent imminent or life-threatening deterioration. Critical care was time spent personally by me on the  following activities: development of treatment plan with patient and/or surrogate as well as nursing, discussions with consultants, evaluation of patient's response to treatment, examination of patient, obtaining history from patient or surrogate, ordering and performing treatments and interventions, ordering and review of laboratory studies, ordering and review of radiographic studies, pulse oximetry and re-evaluation of patient's condition.  Nanda Quinton, MD Emergency Medicine  ____________________________________________   INITIAL IMPRESSION / ASSESSMENT AND PLAN / ED COURSE  Pertinent labs & imaging results that were available during my care of  the patient were reviewed by me and considered in my medical decision making (see chart for details).   Patient presents to the emergency department for evaluation of shortness of breath with chest pain and some altered mental status.  Patient has some memory deficits at baseline but seems worse per family.  Afebrile here with nonfocal neuro exam.  Broad differential at this time including toxic metabolic, stroke, ACS, CHF, etc. Plan for CT imaging of the head, CXR, labs, UA, and reassess.   CT and imaging reviewed. Question pulmonary edema vs PNA. COVID negative. Patient with prolonged QT and Penicillin allergy. Will cover with Doxycycline for now and admit. Also given lasix in the ED. Patient with new O2 requirement here. No acute distress.   Discussed patient's case with [facility], Dr. Wynetta Emery to request admission. Patient and family (if present) updated with plan. Care transferred to [facility] service.  I reviewed all nursing notes, vitals, pertinent old records, EKGs, labs, imaging (as available).  ____________________________________________  FINAL CLINICAL IMPRESSION(S) / ED DIAGNOSES  Final diagnoses:  Transient alteration of awareness  SOB (shortness of breath)  Prolonged Q-T interval on ECG  Community acquired pneumonia, unspecified laterality     MEDICATIONS GIVEN DURING THIS VISIT:  Medications  doxycycline (VIBRAMYCIN) 100 mg in sodium chloride 0.9 % 250 mL IVPB (0 mg Intravenous Stopped 04/17/20 1644)  aspirin EC tablet 325 mg (has no administration in time range)  traMADol (ULTRAM) tablet 50 mg (has no administration in time range)  atorvastatin (LIPITOR) tablet 20 mg (has no administration in time range)  hydrALAZINE (APRESOLINE) tablet 25 mg (25 mg Oral Given 04/17/20 1813)  isosorbide mononitrate (IMDUR) 24 hr tablet 30 mg (has no administration in time range)  metoprolol succinate (TOPROL-XL) 24 hr tablet 200 mg (has no administration in time range)  midodrine (PROAMATINE) tablet  10 mg (has no administration in time range)  QUEtiapine (SEROQUEL) tablet 25 mg (has no administration in time range)  fludrocortisone (FLORINEF) tablet 200 mcg (has no administration in time range)  insulin glargine (LANTUS) injection 24 Units (has no administration in time range)  multivitamin with minerals tablet 1 tablet (has no administration in time range)  sodium chloride flush (NS) 0.9 % injection 3 mL (has no administration in time range)  sodium chloride flush (NS) 0.9 % injection 3 mL (has no administration in time range)  0.9 %  sodium chloride infusion (has no administration in time range)  acetaminophen (TYLENOL) tablet 650 mg (has no administration in time range)  ondansetron (ZOFRAN) injection 4 mg (has no administration in time range)  enoxaparin (LOVENOX) injection 30 mg (has no administration in time range)  furosemide (LASIX) injection 40 mg (has no administration in time range)  zolpidem (AMBIEN) tablet 2.5 mg (has no administration in time range)  insulin aspart (novoLOG) injection 0-9 Units (has no administration in time range)  labetalol (NORMODYNE) injection 10 mg (10 mg Intravenous Given 04/17/20 1814)  furosemide (LASIX) injection 40 mg (40 mg  Intravenous Given 04/17/20 1313)     Note:  This document was prepared using Dragon voice recognition software and may include unintentional dictation errors.  Nanda Quinton, MD, [facility] Emergency Medicine    Harlow Basley, [facility], MD 04/17/20 [phone]

## 2020-04-17 NOTE — ED Triage Notes (Signed)
Pt sister reports pt fell asleep and noted wheezing with pt breathing. Pt sister also reports increased confusion, weakness since 11pm last night. Pt sister reports pt has history of stroke and short term memory loss at baseline.

## 2020-04-17 NOTE — Progress Notes (Signed)
[facility] night shift.  I was notified by pharmacy that the patient's QTC was 510 ms on an earlier EKG.  They deferred starting Seroquel tonight and will be delayed until the following evening.  I discontinued similar.  States it is my Zofran and started the patient on as needed Compazine.  Magnesium sulfate 2 g IVPB was ordered.  We will follow EKG in the morning.    Tennis Must, MD

## 2020-04-18 ENCOUNTER — Inpatient Hospital Stay (HOSPITAL_BASED_OUTPATIENT_CLINIC_OR_DEPARTMENT_OTHER): Payer: 59

## 2020-04-18 ENCOUNTER — Other Ambulatory Visit (HOSPITAL_COMMUNITY): Payer: Self-pay | Admitting: *Deleted

## 2020-04-18 DIAGNOSIS — I5023 Acute on chronic systolic (congestive) heart failure: Secondary | ICD-10-CM | POA: Diagnosis not present

## 2020-04-18 DIAGNOSIS — Z7189 Other specified counseling: Secondary | ICD-10-CM

## 2020-04-18 DIAGNOSIS — E1122 Type 2 diabetes mellitus with diabetic chronic kidney disease: Secondary | ICD-10-CM | POA: Diagnosis not present

## 2020-04-18 DIAGNOSIS — I671 Cerebral aneurysm, nonruptured: Secondary | ICD-10-CM | POA: Diagnosis not present

## 2020-04-18 DIAGNOSIS — J9621 Acute and chronic respiratory failure with hypoxia: Secondary | ICD-10-CM | POA: Diagnosis not present

## 2020-04-18 DIAGNOSIS — Z20822 Contact with and (suspected) exposure to covid-19: Secondary | ICD-10-CM | POA: Diagnosis not present

## 2020-04-18 DIAGNOSIS — J9601 Acute respiratory failure with hypoxia: Secondary | ICD-10-CM | POA: Diagnosis not present

## 2020-04-18 DIAGNOSIS — N185 Chronic kidney disease, stage 5: Secondary | ICD-10-CM | POA: Diagnosis not present

## 2020-04-18 DIAGNOSIS — N184 Chronic kidney disease, stage 4 (severe): Secondary | ICD-10-CM

## 2020-04-18 DIAGNOSIS — G9340 Encephalopathy, unspecified: Secondary | ICD-10-CM | POA: Diagnosis not present

## 2020-04-18 LAB — URINE CULTURE: Culture: NO GROWTH

## 2020-04-18 LAB — CBC WITH DIFFERENTIAL/PLATELET
Abs Immature Granulocytes: 0.06 10*3/uL (ref 0.00–0.07)
Basophils Absolute: 0 10*3/uL (ref 0.0–0.1)
Basophils Relative: 0 %
Eosinophils Absolute: 0.2 10*3/uL (ref 0.0–0.5)
Eosinophils Relative: 2 %
HCT: 29.1 % — ABNORMAL LOW (ref 36.0–46.0)
Hemoglobin: 9.1 g/dL — ABNORMAL LOW (ref 12.0–15.0)
Immature Granulocytes: 1 %
Lymphocytes Relative: 25 %
Lymphs Abs: 2.7 10*3/uL (ref 0.7–4.0)
MCH: 27.6 pg (ref 26.0–34.0)
MCHC: 31.3 g/dL (ref 30.0–36.0)
MCV: 88.2 fL (ref 80.0–100.0)
Monocytes Absolute: 0.8 10*3/uL (ref 0.1–1.0)
Monocytes Relative: 7 %
Neutro Abs: 7.1 10*3/uL (ref 1.7–7.7)
Neutrophils Relative %: 65 %
Platelets: 167 10*3/uL (ref 150–400)
RBC: 3.3 MIL/uL — ABNORMAL LOW (ref 3.87–5.11)
RDW: 14.2 % (ref 11.5–15.5)
WBC: 10.9 10*3/uL — ABNORMAL HIGH (ref 4.0–10.5)
nRBC: 0 % (ref 0.0–0.2)

## 2020-04-18 LAB — BASIC METABOLIC PANEL
Anion gap: 10 (ref 5–15)
BUN: 45 mg/dL — ABNORMAL HIGH (ref 8–23)
CO2: 29 mmol/L (ref 22–32)
Calcium: 8.1 mg/dL — ABNORMAL LOW (ref 8.9–10.3)
Chloride: 104 mmol/L (ref 98–111)
Creatinine, Ser: 2.31 mg/dL — ABNORMAL HIGH (ref 0.44–1.00)
GFR calc Af Amer: 24 mL/min — ABNORMAL LOW (ref >60)
GFR calc non Af Amer: 21 mL/min — ABNORMAL LOW (ref >60)
Glucose, Bld: 194 mg/dL — ABNORMAL HIGH (ref 70–99)
Potassium: 3.7 mmol/L (ref 3.5–5.1)
Sodium: 143 mmol/L (ref 135–145)

## 2020-04-18 LAB — GLUCOSE, CAPILLARY
Glucose-Capillary: 153 mg/dL — ABNORMAL HIGH (ref 70–99)
Glucose-Capillary: 197 mg/dL — ABNORMAL HIGH (ref 70–99)
Glucose-Capillary: 308 mg/dL — ABNORMAL HIGH (ref 70–99)

## 2020-04-18 LAB — ECHOCARDIOGRAM COMPLETE
Height: 61 in
Weight: 3520.31 oz

## 2020-04-18 LAB — HEMOGLOBIN A1C
Hgb A1c MFr Bld: 8.4 % — ABNORMAL HIGH (ref 4.8–5.6)
Mean Plasma Glucose: 194.38 mg/dL

## 2020-04-18 MED ORDER — TRAMADOL HCL 50 MG PO TABS: 50.0000 mg | ORAL_TABLET | Freq: Two times a day (BID) | ORAL | Status: DC | PRN

## 2020-04-18 NOTE — Discharge Summary (Addendum)
Physician Discharge Summary  Victoria Winters GMW:[id number] DOB: 1951/07/09 DOA: 04/17/2020  PCP: Alfonse Flavors, MD  Admit date: 04/17/2020 Discharge date: 04/18/2020  Admitted From:  Home  Disposition: Home with HHPT  Recommendations for Outpatient Follow-up:  1. Follow up with PCP in 1 weeks  Home Health:  PT   Discharge Condition: STABLE   CODE STATUS: DNR    Brief Hospitalization Summary: Please see all hospital notes, images, labs for full details of the hospitalization. ADMISSION HPI: Victoria Winters is a 69 y.o. 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  Pt had been very weak since last night.  Also she noticed that the patient was wheezing and had been complaining of shortness of breath.  Pt denies chest pain.  Pt has had no fever or chills.    ED Course: pt was hypoxic on arrival and placed on supplemental oxygen, Pt was noted to have an elevated BNP and noted to have a leukocytosis with WBC of 13.7.  Pt was afebrile.  CXR with findings of infiltrate versus edema.  Pt was given IV lasix and started on doxycycline in ED.  Sars 2 coronavirus test is pending.  Ammonia test is normal at 12.  Glucose was elevated at 215.    The patient was admitted for mild CHF exacerbation and responded very well to IV Lasix.  She has diuresed well.  She is no longer having symptoms of shortness of breath.  She also presented with an encephalopathy and this was thought to be related to her sedating medications from home.  This was placed on hold.  She is feeling a lot better.  MRI brain with no acute CVA.  She was seen by PT and they have recommended home health services which is been ordered.  She is also treated for pneumonia but with negative procalcitonin level  the doxycycline was discontinued.  Her hemoglobin A1c is suboptimally controlled at 8.4%.  I have recommended that she follow-up with her PCP for further adjustments to her medications for diabetes.  Her TSH was within normal limits at 1.827.  The SARS 2 coronavirus test was negative.  Ammonia was normal at 12 and high-sensitivity troponin was 17.  Echo noted below.  Pt is feeling much better.  She would like to go home.  I think is safe for her to discharge home with her sister who takes care of her and we have arranged for the home health PT.   UM note: I notified Larene Beach Cox of discharge and she updated patient status to OBS and provided Code 44.   Echocardiogram IMPRESSIONS  1. Left ventricular ejection fraction, by estimation, is 65 to 70%. The  left ventricle has normal function. The left ventricle has no regional  wall motion abnormalities. There is mild concentric left ventricular  hypertrophy. Left ventricular diastolic  parameters are consistent with Grade II diastolic dysfunction  (pseudonormalization). Elevated left ventricular end-diastolic pressure.  2. Right ventricular systolic function is normal. The right ventricular  size is normal. Tricuspid regurgitation signal is inadequate for assessing  PA pressure.  3. Left atrial size was moderately dilated.  4. The mitral valve is grossly normal. Mild mitral valve regurgitation.  5. The aortic valve has an indeterminant number of cusps. Aortic valve  regurgitation  is not visualized.  6. The inferior vena cava is dilated in size with >50% respiratory  variability, suggesting right atrial pressure of 8 mmHg.  7. A small pericardial effusion is present. The pericardial effusion is  circumferential. There is no evidence of cardiac tamponade.    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 Yes Did it involve sudden or severe rash/hives, skin peeling, or any reaction on the inside of your mouth or nose? Unknown Did you need to seek medical attention at a hospital or doctor's office? Unknown When did it last happen?Within past 10 years If all above answers are "NO", may proceed with cephalosporin use.   Chicken Allergy    Contrast Media [iodinated Diagnostic Agents]    Eggs Or Egg-derived Products Swelling   Milk-related Compounds    Nickel    Oatmeal    Other    Black beans, cats, dogs, grass clippings   Wheat Bran       Medication List    STOP taking these medications   doxycycline 100 MG tablet Commonly known as: VIBRA-TABS     TAKE these medications   aspirin EC 325 MG tablet Take 1 tablet (325 mg total) by mouth every morning.   atorvastatin 20 MG tablet Commonly known as: LIPITOR Take 20 mg by mouth daily.   cetirizine 5 MG tablet Commonly known as: ZYRTEC Take 5 mg by mouth daily.   escitalopram 10 MG tablet Commonly known as: LEXAPRO Take 10 mg by mouth every morning.   fludrocortisone 0.1 MG tablet Commonly known as: FLORINEF Take 2 tablets by mouth daily.   furosemide 40 MG tablet Commonly known as: LASIX Take 40 mg by mouth daily.   hydrALAZINE 25 MG tablet Commonly known as: APRESOLINE Take 25 mg by mouth 3 (three) times daily.   insulin glargine 100 UNIT/ML injection Commonly known as: LANTUS Inject 0.1 mLs (10 Units total) into the skin at bedtime. What changed: how much to take   insulin lispro 100 UNIT/ML injection Commonly known as: HUMALOG Inject 1-17 Units into the skin 3 (three) times  daily before meals. Sliding scale starting at; 180-220= 4 units 221-260= 6 units 261-300= 8 units 301-340=10units 341-380=12units   isosorbide mononitrate 30 MG 24 hr tablet Commonly known as: IMDUR Take 30 mg by mouth every morning.   linagliptin 5 MG Tabs tablet Commonly known as: TRADJENTA Take 5 mg by mouth every morning.   loratadine 10 MG tablet Commonly known as: CLARITIN Take 10 mg by mouth every morning.   melatonin 1 MG Tabs tablet Take 5 mg by mouth at bedtime.   metoprolol succinate 100 MG 24 hr tablet Commonly known as: TOPROL-XL Take 200 mg by mouth every morning.   midodrine 5 MG tablet Commonly known as: PROAMATINE Take 10 mg by mouth in the morning, at noon, and at bedtime.   multivitamin with minerals Tabs tablet Take 1 tablet by mouth daily.   pregabalin 75 MG capsule Commonly known as: LYRICA  QUEtiapine 25 MG tablet Commonly known as: SEROQUEL Take 1 tablet (25 mg total) by mouth at bedtime.   traMADol 50 MG tablet Commonly known as: ULTRAM Take 1 tablet (50 mg total) by mouth every 6 (six) hours as needed.      Follow-up Information    Zhou-Talbert, Elwyn Lade, MD. Schedule an appointment as soon as possible for a visit in 1 week(s).   Specialty: Family Medicine Contact information: [address] [place] [address] [phone]          Allergies  Allergen Reactions  . Penicillins Anaphylaxis    Did it involve swelling of the face/tongue/throat, SOB, or low BP? Yes Did it involve sudden or severe rash/hives, skin peeling, or any reaction on the inside of your mouth or nose? Unknown Did you need to seek medical attention at a hospital or doctor's office? Unknown When did it last happen?Within past 10 years If all above answers are "NO", may proceed with cephalosporin use.   . Chicken Allergy   . Contrast Media [Iodinated Diagnostic Agents]   . Eggs Or Egg-Derived Products Swelling  . Milk-Related Compounds   . Nickel    . Oatmeal   . Other     Black beans, cats, dogs, grass clippings  . Wheat Bran    Allergies as of 04/18/2020      Reactions   Penicillins Anaphylaxis   Did it involve swelling of the face/tongue/throat, SOB, or low BP? Yes Did it involve sudden or severe rash/hives, skin peeling, or any reaction on the inside of your mouth or nose? Unknown Did you need to seek medical attention at a hospital or doctor's office? Unknown When did it last happen?Within past 10 years If all above answers are "NO", may proceed with cephalosporin use.   Chicken Allergy    Contrast Media [iodinated Diagnostic Agents]    Eggs Or Egg-derived Products Swelling   Milk-related Compounds    Nickel    Oatmeal    Other    Black beans, cats, dogs, grass clippings   Wheat Bran       Medication List    STOP taking these medications   doxycycline 100 MG tablet Commonly known as: VIBRA-TABS     TAKE these medications   aspirin EC 325 MG tablet Take 1 tablet (325 mg total) by mouth every morning.   atorvastatin 20 MG tablet Commonly known as: LIPITOR Take 20 mg by mouth daily.   cetirizine 5 MG tablet Commonly known as: ZYRTEC Take 5 mg by mouth daily.   escitalopram 10 MG tablet Commonly known as: LEXAPRO Take 10 mg by mouth every morning.   fludrocortisone 0.1 MG tablet Commonly known as: FLORINEF Take 2 tablets by mouth daily.   furosemide 40 MG tablet Commonly known as: LASIX Take 40 mg by mouth daily.   hydrALAZINE 25 MG tablet Commonly known as: APRESOLINE Take 25 mg by mouth 3 (three) times daily.   insulin glargine 100 UNIT/ML injection Commonly known as: LANTUS Inject 0.1 mLs (10 Units total) into the skin at bedtime. What changed: how much to take   insulin lispro 100 UNIT/ML injection Commonly known as: HUMALOG Inject 1-17 Units into the skin 3 (three) times daily before meals. Sliding scale starting at; 180-220= 4 units 221-260= 6 units 261-300= 8  units 301-340=10units 341-380=12units   isosorbide mononitrate 30 MG 24 hr tablet Commonly known as: IMDUR Take 30 mg by mouth every morning.   linagliptin 5 MG Tabs tablet  Commonly known as: TRADJENTA Take 5 mg by mouth every morning.   loratadine 10 MG tablet Commonly known as: CLARITIN Take 10 mg by mouth every morning.   melatonin 1 MG Tabs tablet Take 5 mg by mouth at bedtime.   metoprolol succinate 100 MG 24 hr tablet Commonly known as: TOPROL-XL Take 200 mg by mouth every morning.   midodrine 5 MG tablet Commonly known as: PROAMATINE Take 10 mg by mouth in the morning, at noon, and at bedtime.   multivitamin with minerals Tabs tablet Take 1 tablet by mouth daily.   pregabalin 75 MG capsule Commonly known as: LYRICA   QUEtiapine 25 MG tablet Commonly known as: SEROQUEL Take 1 tablet (25 mg total) by mouth at bedtime.   traMADol 50 MG tablet Commonly known as: ULTRAM Take 1 tablet (50 mg total) by mouth every 6 (six) hours as needed.       Procedures/Studies: CT Head Wo Contrast  Result Date: 04/17/2020 CLINICAL DATA:  Ataxia, stroke suspected. Additional history provided: Increased confusion, weakness since 11 p.m. last night, history of stroke, short-term memory loss at baseline EXAM: CT HEAD WITHOUT CONTRAST TECHNIQUE: Contiguous axial images were obtained from the base of the skull through the vertex without intravenous contrast. COMPARISON:  Brain MRI 07/21/2019, head CT 07/20/2019. FINDINGS: Brain: Redemonstrated chronic lacunar infarcts within the cerebral white matter and basal ganglia bilaterally. A known chronic infarct within the paramedian right pons was better appreciated on prior MRI 07/21/2019. Stable, mild generalized parenchymal atrophy. There is no acute intracranial hemorrhage. No demarcated cortical infarct. No extra-axial fluid collection. No evidence of intracranial mass. No midline shift. Partially empty sella turcica. Vascular: No hyperdense  vessel.  Atherosclerotic calcifications. Skull: Normal. Negative for fracture or focal lesion. Sinuses/Orbits: Visualized orbits show no acute finding. Minimal scattered paranasal sinus mucosal thickening. No significant mastoid effusion at the imaged levels. IMPRESSION: 1. No CT evidence of acute intracranial abnormality. 2. Redemonstrated chronic lacunar infarcts within the bilateral cerebral white matter and basal ganglia. 3. A known chronic infarct within the paramedian right pons was better appreciated on prior MRI 07/21/2019. 4. Minimal scattered paranasal sinus mucosal thickening. Electronically Signed   By: Kellie Simmering DO   On: 04/17/2020 11:23   MR BRAIN WO CONTRAST  Result Date: 04/17/2020 CLINICAL DATA:  Encephalopathy EXAM: MRI HEAD WITHOUT CONTRAST TECHNIQUE: Multiplanar, multiecho pulse sequences of the brain and surrounding structures were obtained without intravenous contrast. COMPARISON:  07/21/2019 FINDINGS: Brain: There is no acute infarction or intracranial hemorrhage. There is no intracranial mass, mass effect, or edema. There is no hydrocephalus or extra-axial fluid collection. There are multiple chronic small vessel infarcts including involvement of bilateral basal ganglia and adjacent white matter as well as the right pons. There is hemosiderin deposition associated with the left basal ganglia infarct. Additional patchy T2 hyperintensity in the supratentorial white matter is nonspecific but may reflect mild chronic microvascular ischemic changes. Ventricles and sulci are within normal limits in size and configuration. Vascular: Major vessel flow voids at the skull base are preserved. Skull and upper cervical spine: Normal marrow signal is preserved. Sinuses/Orbits: Trace mucosal thickening. Bilateral lens replacements. Other: Incidental note is made of a partially empty sella. Trace mastoid fluid opacification. IMPRESSION: No evidence of recent infarction, hemorrhage, or mass. Stable  chronic findings detailed above. Electronically Signed   By: Macy Mis M.D.   On: 04/17/2020 14:14   DG Chest Portable 1 View  Result Date: 04/17/2020 CLINICAL DATA:  Shortness of breath.  Additional provided: Wheezing, increased confusion, weakness since 11 p.m. last night, pending COVID test. EXAM: PORTABLE CHEST 1 VIEW COMPARISON:  Chest radiograph 09/05/2019 FINDINGS: Unchanged cardiomegaly. Central pulmonary vascular congestion. Ill-defined opacities at the bilateral lung bases. No definite pleural effusion or evidence of pneumothorax. 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 IMPRESSION: Unchanged cardiomegaly.  Central pulmonary vascular congestion. Ill-defined opacities at the bilateral lung bases may reflect edema, atelectasis or pneumonia. Electronically Signed   By: Kellie Simmering DO   On: 04/17/2020 11:12   ECHOCARDIOGRAM COMPLETE  Result Date: 04/18/2020    ECHOCARDIOGRAM REPORT   Patient Name:   Victoria Winters Date of Exam: 04/18/2020 Medical Rec #:  [id number]  Height:       61.0 in Accession #:    [id number] Weight:       220.0 lb Date of Birth:  05-Nov-1951 BSA:          1.967 m Patient Age:    69 years   BP:           159/64 mmHg Patient Gender: F          HR:           72 bpm. Exam Location:  [facility] Procedure: 2D Echo, Cardiac Doppler and Color Doppler Indications:    I50.23 Acute on chronic systolic (congestive) heart failure  History:        Patient has no prior history of Echocardiogram examinations.                 Stroke; Risk Factors:Diabetes. CKD.  Sonographer:    Jonelle Sidle Dance Referring Phys: [place]  1. Left ventricular ejection fraction, by estimation, is 65 to 70%. The left ventricle has normal function. The left ventricle has no regional wall motion abnormalities. There is mild concentric left ventricular hypertrophy. Left ventricular diastolic parameters are consistent with Grade II diastolic  dysfunction (pseudonormalization). Elevated left ventricular end-diastolic pressure.  2. Right ventricular systolic function is normal. The right ventricular size is normal. Tricuspid regurgitation signal is inadequate for assessing PA pressure.  3. Left atrial size was moderately dilated.  4. The mitral valve is grossly normal. Mild mitral valve regurgitation.  5. The aortic valve has an indeterminant number of cusps. Aortic valve regurgitation is not visualized.  6. The inferior vena cava is dilated in size with >50% respiratory variability, suggesting right atrial pressure of 8 mmHg.  7. A small pericardial effusion is present. The pericardial effusion is circumferential. There is no evidence of cardiac tamponade. FINDINGS  Left Ventricle: Left ventricular ejection fraction, by estimation, is 65 to 70%. The left ventricle has normal function. The left ventricle has no regional wall motion abnormalities. The left ventricular internal cavity size was normal in size. There is  mild concentric left ventricular hypertrophy. Left ventricular diastolic parameters are consistent with Grade II diastolic dysfunction (pseudonormalization). Elevated left ventricular end-diastolic pressure. Right Ventricle: The right ventricular size is normal. No increase in right ventricular wall thickness. Right ventricular systolic function is normal. Tricuspid regurgitation signal is inadequate for assessing PA pressure. Left Atrium: Left atrial size was moderately dilated. Right Atrium: Right atrial size was normal in size. Pericardium: A small pericardial effusion is present. The pericardial effusion is circumferential. There is no evidence of cardiac tamponade. Mitral Valve: The mitral valve is grossly normal. Mild mitral annular calcification. Mild mitral valve regurgitation. Tricuspid Valve: The tricuspid valve is grossly normal. Tricuspid valve regurgitation is mild.  Aortic Valve: The aortic valve has an indeterminant number of  cusps. Aortic valve regurgitation is not visualized. Mild aortic valve annular calcification. There is mild calcification of the aortic valve. Pulmonic Valve: The pulmonic valve was not well visualized. Pulmonic valve regurgitation is not visualized. Aorta: The aortic root is normal in size and structure. Venous: The inferior vena cava is dilated in size with greater than 50% respiratory variability, suggesting right atrial pressure of 8 mmHg. IAS/Shunts: No atrial level shunt detected by color flow Doppler.  LEFT VENTRICLE PLAX 2D LVIDd:         4.58 cm  Diastology LVIDs:         3.69 cm  LV e' lateral:   4.57 cm/s LV PW:         1.30 cm  LV E/e' lateral: 35.2 LV IVS:        1.13 cm  LV e' medial:    3.92 cm/s LVOT diam:     2.00 cm  LV E/e' medial:  41.1 LV SV:         90 LV SV Index:   46 LVOT Area:     3.14 cm  RIGHT VENTRICLE RV Basal diam:  2.79 cm RV S prime:     13.50 cm/s TAPSE (M-mode): 2.4 cm LEFT ATRIUM              Index       RIGHT ATRIUM           Index LA diam:        4.60 cm  2.34 cm/m  RA Area:     16.00 cm LA Vol (A2C):   115.0 ml 58.47 ml/m RA Volume:   38.30 ml  19.47 ml/m LA Vol (A4C):   75.0 ml  38.13 ml/m LA Biplane Vol: 95.2 ml  48.40 ml/m  AORTIC VALVE LVOT Vmax:   100.00 cm/s LVOT Vmean:  69.900 cm/s LVOT VTI:    0.285 m  AORTA Ao Root diam: 3.20 cm Ao Asc diam:  3.60 cm MITRAL VALVE MV Area (PHT): 3.54 cm     SHUNTS MV Decel Time: 214 msec     Systemic VTI:  0.29 m MV E velocity: 161.00 cm/s  Systemic Diam: 2.00 cm MV A velocity: 149.00 cm/s MV E/A ratio:  1.08 Kate Sable MD Electronically signed by Kate Sable MD Signature Date/Time: 04/18/2020/10:06:32 AM    Final       Subjective: Pt says she feels much better and she really wants to get back home.  No more SOB.  No CP.    Discharge Exam: Vitals:   04/18/20 0702 04/18/20 1145  BP: (!) 159/64 (!) 170/57  Pulse: 72 66  Resp: 18 20  Temp: 98.8 F (37.1 C)   SpO2: 99% 92%   Vitals:   04/18/20 0500  04/18/20 0630 04/18/20 0702 04/18/20 1145  BP:  (!) 169/68 (!) 159/64 (!) 170/57  Pulse:  70 72 66  Resp:  19 18 20   Temp:  98.9 F (37.2 C) 98.8 F (37.1 C)   TempSrc:  Oral Oral   SpO2:  98% 99% 92%  Weight: 99.8 kg     Height:        General: Pt is alert, awake, not in acute distress Cardiovascular: RRR, S1/S2 +, no rubs, no gallops Respiratory: CTA bilaterally, no wheezing, no rhonchi Abdominal: Soft, NT, ND, bowel sounds + Extremities: no edema, no cyanosis Neurological: nonfocal exam.    The results of significant  diagnostics from this hospitalization (including imaging, microbiology, ancillary and laboratory) are listed below for reference.     Microbiology: Recent Results (from the past 240 hour(s))  SARS Coronavirus 2 by RT PCR (hospital order, performed in [facility] hospital lab) Nasopharyngeal Nasopharyngeal Swab     Status: None   Collection Time: 04/17/20  1:26 PM   Specimen: Nasopharyngeal Swab  Result Value Ref Range Status   SARS Coronavirus 2 NEGATIVE NEGATIVE Final    Comment: (NOTE) SARS-CoV-2 target nucleic acids are NOT DETECTED. The SARS-CoV-2 RNA is generally detectable in upper and lower respiratory specimens during the acute phase of infection. The lowest concentration of SARS-CoV-2 viral copies this assay can detect is 250 copies / mL. A negative result does not preclude SARS-CoV-2 infection and should not be used as the sole basis for treatment or other patient management decisions.  A negative result may occur with improper specimen collection / handling, submission of specimen other than nasopharyngeal swab, presence of viral mutation(s) within the areas targeted by this assay, and inadequate number of viral copies (<250 copies / mL). A negative result must be combined with clinical observations, patient history, and epidemiological information. Fact Sheet for Patients:   StrictlyIdeas.no Fact Sheet for Healthcare  Providers: BankingDealers.co.za This test is not yet approved or cleared  by the [place] FDA and has been authorized for detection and/or diagnosis of SARS-CoV-2 by FDA under an Emergency Use Authorization (EUA).  This EUA will remain in effect (meaning this test can be used) for the duration of the COVID-19 declaration under Section 564(b)(1) of the Act, 21 U.S.C. section 360bbb-3(b)(1), unless the authorization is terminated or revoked sooner. Performed at [facility], [address], [place], [place] [address]   Urine culture     Status: None   Collection Time: 04/17/20  3:19 PM   Specimen: Urine, Clean Catch  Result Value Ref Range Status   Specimen Description   Final    URINE, CLEAN CATCH Performed at [facility], [address], [place], [place] [address]    Special Requests   Final    NONE Performed at [facility], [address], [place], [place] [address]    Culture   Final    NO GROWTH Performed at [facility], [place] [address] [place], [place] [address]    Report Status 04/18/2020 FINAL  Final     Labs: BNP (last 3 results) Recent Labs    07/20/19 1631 04/17/20 1039  BNP 109.0* 485.4*   Basic Metabolic Panel: Recent Labs  Lab 04/17/20 1039 04/18/20 0545  NA 141 143  K 4.0 3.7  CL 104 104  CO2 27 29  GLUCOSE 215* 194*  BUN 52* 45*  CREATININE 2.49* 2.31*  CALCIUM 8.2* 8.1*   Liver Function Tests: Recent Labs  Lab 04/17/20 1039  AST 16  ALT 17  ALKPHOS 93  BILITOT 1.0  PROT 6.5  ALBUMIN 3.3*   Recent Labs  Lab 04/17/20 1039  LIPASE 35   Recent Labs  Lab 04/17/20 1125  AMMONIA 12   CBC: Recent Labs  Lab 04/17/20 1039 04/18/20 0545  WBC 13.7* 10.9*  NEUTROABS 8.6* 7.1  HGB 9.3* 9.1*  HCT 29.9* 29.1*  MCV 88.5 88.2  PLT 208 167   Cardiac Enzymes: No results for input(s): CKTOTAL, CKMB, CKMBINDEX, TROPONINI in the last 168 hours. BNP: Invalid input(s): POCBNP CBG: Recent Labs  Lab  04/17/20 2317 04/18/20 0348 04/18/20 0840 04/18/20 1207  GLUCAP 193* 153*  197* 308*   D-Dimer No results for input(s): DDIMER in the last 72 hours. Hgb A1c Recent Labs    04/17/20 1039  HGBA1C 8.4*   Lipid Profile No results for input(s): CHOL, HDL, LDLCALC, TRIG, CHOLHDL, LDLDIRECT in the last 72 hours. Thyroid function studies Recent Labs    04/17/20 1039  TSH 1.827   Anemia work up No results for input(s): VITAMINB12, FOLATE, FERRITIN, TIBC, IRON, RETICCTPCT in the last 72 hours. Urinalysis    Component Value Date/Time   COLORURINE YELLOW 04/17/2020 [place] 04/17/2020 1519   LABSPEC 1.008 04/17/2020 1519   PHURINE 6.0 04/17/2020 1519   GLUCOSEU 150 (A) 04/17/2020 1519   HGBUR SMALL (A) 04/17/2020 1519   BILIRUBINUR NEGATIVE 04/17/2020 1519   KETONESUR NEGATIVE 04/17/2020 1519   PROTEINUR 100 (A) 04/17/2020 1519   NITRITE NEGATIVE 04/17/2020 1519   LEUKOCYTESUR NEGATIVE 04/17/2020 1519   Sepsis Labs Invalid input(s): PROCALCITONIN,  WBC,  LACTICIDVEN Microbiology Recent Results (from the past 240 hour(s))  SARS Coronavirus 2 by RT PCR (hospital order, performed in [place] hospital lab) Nasopharyngeal Nasopharyngeal Swab     Status: None   Collection Time: 04/17/20  1:26 PM   Specimen: Nasopharyngeal Swab  Result Value Ref Range Status   SARS Coronavirus 2 NEGATIVE NEGATIVE Final    Comment: (NOTE) SARS-CoV-2 target nucleic acids are NOT DETECTED. The SARS-CoV-2 RNA is generally detectable in upper and lower respiratory specimens during the acute phase of infection. The lowest concentration of SARS-CoV-2 viral copies this assay can detect is 250 copies / mL. A negative result does not preclude SARS-CoV-2 infection and should not be used as the sole basis for treatment or other patient management decisions.  A negative result may occur with improper specimen collection / handling, submission of specimen other than nasopharyngeal swab,  presence of viral mutation(s) within the areas targeted by this assay, and inadequate number of viral copies (<250 copies / mL). A negative result must be combined with clinical observations, patient history, and epidemiological information. Fact Sheet for Patients:   StrictlyIdeas.no Fact Sheet for Healthcare Providers: BankingDealers.co.za This test is not yet approved or cleared  by the [place] FDA and has been authorized for detection and/or diagnosis of SARS-CoV-2 by FDA under an Emergency Use Authorization (EUA).  This EUA will remain in effect (meaning this test can be used) for the duration of the COVID-19 declaration under Section 564(b)(1) of the Act, 21 U.S.C. section 360bbb-3(b)(1), unless the authorization is terminated or revoked sooner. Performed at [facility], [address] [place], [place] [address]   Urine culture     Status: None   Collection Time: 04/17/20  3:19 PM   Specimen: Urine, Clean Catch  Result Value Ref Range Status   Specimen Description   Final    URINE, CLEAN CATCH Performed at [facility], [address], [place], [place] [address]    Special Requests   Final    NONE Performed at [facility], [address] [place], [place] [address]    Culture   Final    NO GROWTH Performed at [facility], [place] [address], [place], [place] [address]    Report Status 04/18/2020 FINAL  Final   Time coordinating discharge:   SIGNED:  Irwin Brakeman, MD  [facility] 04/18/2020, 3:18 PM How to contact the [facility] Attending or Consulting provider [place] or covering provider during after hours [place], for this patient?  1. Check the  care team in [facility] and look for a) attending/consulting [place] provider listed and b) the [facility] team listed 2. Log into [email or web address] and use [place]'s universal password to access. If you do not have the password, please contact the hospital operator. 3. Locate the [facility]  provider you are looking for under [facility] and page to a number that you can be directly reached. 4. If you still have difficulty reaching the provider, please page the [place] (Director on Call) for the Hospitalists listed on amion for assistance.

## 2020-04-18 NOTE — Discharge Instructions (Signed)
Shortness of Breath, Adult Shortness of breath means you have trouble breathing. Shortness of breath could be a sign of a medical problem. Follow these instructions at home:   Watch for any changes in your symptoms.  Do not use any products that contain nicotine or tobacco, such as cigarettes, e-cigarettes, and chewing tobacco.  Do not smoke. Smoking can cause shortness of breath. If you need help to quit smoking, ask your doctor.  Avoid things that can make it harder to breathe, such as: ? Mold. ? Dust. ? Air pollution. ? Chemical smells. ? Things that can cause allergy symptoms (allergens), if you have allergies.  Keep your living space clean. Use products that help remove mold and dust.  Rest as needed. Slowly return to your normal activities.  Take over-the-counter and prescription medicines only as told by your doctor. This includes oxygen therapy and inhaled medicines.  Keep all follow-up visits as told by your doctor. This is important. Contact a doctor if:  Your condition does not get better as soon as expected.  You have a hard time doing your normal activities, even after you rest.  You have new symptoms. Get help right away if:  Your shortness of breath gets worse.  You have trouble breathing when you are resting.  You feel light-headed or you pass out (faint).  You have a cough that is not helped by medicines.  You cough up blood.  You have pain with breathing.  You have pain in your chest, arms, shoulders, or belly (abdomen).  You have a fever.  You cannot walk up stairs.  You cannot exercise the way you normally do. These symptoms may represent a serious problem that is an emergency. Do not wait to see if the symptoms will go away. Get medical help right away. Call your local emergency services (911 in the [place] Do not drive yourself to the hospital. Summary  Shortness of breath is when you have trouble breathing enough air. It can be a sign of a  medical problem.  Avoid things that make it hard for you to breathe, such as smoking, pollution, mold, and dust.  Watch for any changes in your symptoms. Contact your doctor if you do not get better or you get worse. This information is not intended to replace advice given to you by your health care provider. Make sure you discuss any questions you have with your health care provider. Document Revised: 04/25/2018 Document Reviewed: 04/25/2018 Elsevier Patient Education  2020 [employer]  

## 2020-04-18 NOTE — Plan of Care (Signed)
  Problem: Acute Rehab PT Goals(only PT should resolve) Goal: Pt Will Go Supine/Side To Sit Outcome: Progressing Flowsheets (Taken 04/18/2020 1405) Pt will go Supine/Side to Sit: with modified independence Goal: Patient Will Transfer Sit To/From Stand Outcome: Progressing Flowsheets (Taken 04/18/2020 1405) Patient will transfer sit to/from stand: with supervision Goal: Pt Will Transfer Bed To Chair/Chair To Bed Outcome: Progressing Flowsheets (Taken 04/18/2020 1405) Pt will Transfer Bed to Chair/Chair to Bed: with supervision Goal: Pt Will Ambulate Outcome: Progressing Flowsheets (Taken 04/18/2020 1405) Pt will Ambulate:  50 feet  with supervision  with rolling walker   2:06 PM, 04/18/20 Lonell Grandchild, MPT Physical Therapist with [facility] [phone] office [phone] mobile phone

## 2020-04-18 NOTE — Progress Notes (Signed)
Pt has been up in chair all afternoon watching TV. Pleasantly confused, repeats same questions over and over, but cooperative and knows that MD stated she is going home today. Has talked with sister on phone. Sister now here and pt prepping for discharge.

## 2020-04-18 NOTE — Consult Note (Signed)
[name], MD     [email or web address]          Victoria Winters is an 69 y.o. female.   ASSESSMENT/PLAN: 1.  Acute toxic metabolic encephalopathy likely due to acute congestive heart failure and acute renal failure.  No additional work-up is warranted 2.  Cognitive impairment at baseline 3.  Orthostatic hypotension causing gait impairment stabilized on current medications. 4.  Remote lacunar infarcts: Continue with aspirin and other risk factor modifications.      Patient is 69 year old right-handed white female who presents with acute altered mental status changes and unresponsiveness.  We will see the patient in the office and saw her a few days ago.  She has been seen there for significant gait instability, falling and memory impairment.  Her symptoms were stabilized a couple months ago and no medication changes were made.  The work-up in the hospital has been significant for acute congestive heart failure and acute on chronic renal impairment.  The review of systems is limited due to baseline cognitive impairment but she reported that she is doing well at this time although she does not know why she is in the hospital.  GENERAL: This a pleasant obese female who is in no acute distress.  She is feeding herself breakfast.  HEENT: The neck is supple no trauma appreciated.  ABDOMEN: soft  EXTREMITIES: No edema; there is significant arthritic changes noted bilaterally.  The right knee is slightly swollen and painful.  BACK: This is normal.  SKIN: Normal by inspection.    MENTAL STATUS: She is awake and alert and mostly lucid and coherent although disoriented.  She does not recognizes me from the office.  She is not oriented to location or time.  She does follow commands well.  No dysarthria is noted.  CRANIAL NERVES: Pupils are equal, round and reactive to light and accomodation; extra ocular movements are full, there is no significant nystagmus; visual  fields are full; upper and lower facial muscles are normal in strength and symmetric, there is no flattening of the nasolabial folds; tongue is midline; uvula is midline; shoulder elevation is normal.  MOTOR: Normal tone, bulk and strength; no pronator drift.  COORDINATION: Left finger to nose is normal, right finger to nose is normal, No rest tremor; no intention tremor; no postural tremor; no bradykinesia.  REFLEXES: Deep tendon reflexes are symmetrical and normal.   SENSATION: Normal to light touch, temperature, and pain.     Blood pressure (!) 159/64, pulse 72, temperature 98.8 F (37.1 C), temperature source Oral, resp. rate 18, height 5\' 1"  (1.549 m), weight 99.8 kg, SpO2 99 %.  Past Medical History:  Diagnosis Date  . Brain aneurysm   . Diabetes mellitus without complication ([place])   . Hallucinations    visual and auditory  . Memory loss   . Recurrent UTI   . Renal disorder   . Stage 5 chronic kidney disease ([place])   . Stroke [facility])     Past Surgical History:  Procedure Laterality Date  . CHOLECYSTECTOMY    . SKIN GRAFT      Family History  Problem Relation Age of Onset  . Heart disease Mother   . Diabetes Mellitus II Mother   . Heart disease Father   . Diabetes Father   . Heart disease Brother   . Cancer Other     Social History:  reports that she has quit smoking. She has never used smokeless tobacco. She reports current alcohol  use. She reports that she does not use drugs.  Allergies:  Allergies  Allergen Reactions  . Penicillins Anaphylaxis    Did it involve swelling of the face/tongue/throat, SOB, or low BP? Yes Did it involve sudden or severe rash/hives, skin peeling, or any reaction on the inside of your mouth or nose? Unknown Did you need to seek medical attention at a hospital or doctor's office? Unknown When did it last happen?Within past 10 years If all above answers are "NO", may proceed with cephalosporin use.   . Chicken Allergy   .  Contrast Media [Iodinated Diagnostic Agents]   . Eggs Or Egg-Derived Products Swelling  . Milk-Related Compounds   . Nickel   . Oatmeal   . Other     Black beans, cats, dogs, grass clippings  . Wheat Bran     Medications: Prior to Admission medications   Medication Sig Start Date End Date Taking? Authorizing Provider  aspirin EC 325 MG tablet Take 1 tablet (325 mg total) by mouth every morning. 07/29/19  Yes Memon, Jolaine Artist, MD  atorvastatin (LIPITOR) 20 MG tablet Take 20 mg by mouth daily. 09/29/19  Yes [provider]  cetirizine (ZYRTEC) 5 MG tablet Take 5 mg by mouth daily.   Yes [provider]  doxycycline (VIBRA-TABS) 100 MG tablet Take 100 mg by mouth 2 (two) times daily. 14 day course starting on 10/23/2019 10/23/19  Yes [provider]  escitalopram (LEXAPRO) 10 MG tablet Take 10 mg by mouth every morning.   Yes [provider]  fludrocortisone (FLORINEF) 0.1 MG tablet Take 2 tablets by mouth daily. 03/19/20  Yes [provider]  furosemide (LASIX) 40 MG tablet Take 40 mg by mouth daily. 10/03/19  Yes [provider]  hydrALAZINE (APRESOLINE) 25 MG tablet Take 25 mg by mouth 3 (three) times daily.   Yes [provider]  insulin glargine (LANTUS) 100 UNIT/ML injection Inject 0.1 mLs (10 Units total) into the skin at bedtime. Patient taking differently: Inject 27 Units into the skin at bedtime.  07/29/19  Yes Kathie Dike, MD  insulin lispro (HUMALOG) 100 UNIT/ML injection Inject 1-17 Units into the skin 3 (three) times daily before meals. Sliding scale starting at; 180-220= 4 units 221-260= 6 units 261-300= 8 units 301-340=10units 341-380=12units   Yes [provider]  isosorbide mononitrate (IMDUR) 30 MG 24 hr tablet Take 30 mg by mouth every morning.   Yes [provider]  linagliptin (TRADJENTA) 5 MG TABS tablet Take 5 mg by mouth every morning.   Yes [provider]  loratadine  (CLARITIN) 10 MG tablet Take 10 mg by mouth every morning.   Yes [provider]  Melatonin 1 MG TABS Take 5 mg by mouth at bedtime.    Yes [provider]  metoprolol succinate (TOPROL-XL) 100 MG 24 hr tablet Take 200 mg by mouth every morning.    Yes [provider]  midodrine (PROAMATINE) 5 MG tablet Take 10 mg by mouth in the morning, at noon, and at bedtime. 02/23/20  Yes [provider]  Multiple Vitamin (MULTIVITAMIN WITH MINERALS) TABS tablet Take 1 tablet by mouth daily.   Yes [provider]  pregabalin (LYRICA) 75 MG capsule  10/23/19  Yes [provider]  QUEtiapine (SEROQUEL) 25 MG tablet Take 1 tablet (25 mg total) by mouth at bedtime. 07/29/19  Yes Kathie Dike, MD  traMADol (ULTRAM) 50 MG tablet Take 1 tablet (50 mg total) by mouth every 6 (six) hours  as needed. 09/05/19   Lily Kocher, PA-C    Scheduled Meds: . aspirin EC  325 mg Oral q morning - 10a  . atorvastatin  20 mg Oral Daily  . enoxaparin (LOVENOX) injection  30 mg Subcutaneous Q24H  . fludrocortisone  200 mcg Oral Daily  . furosemide  40 mg Intravenous BID  . hydrALAZINE  25 mg Oral TID  . insulin aspart  0-9 Units Subcutaneous TID WC  . insulin glargine  24 Units Subcutaneous QHS  . isosorbide mononitrate  30 mg Oral q morning - 10a  . metoprolol succinate  200 mg Oral q morning - 10a  . midodrine  10 mg Oral TID WC  . multivitamin with minerals  1 tablet Oral Daily  . QUEtiapine  25 mg Oral QHS  . sodium chloride flush  3 mL Intravenous Q12H   Continuous Infusions: . sodium chloride 250 mL (04/17/20 2311)  . doxycycline (VIBRAMYCIN) IV 100 mg (04/18/20 0115)   PRN Meds:.sodium chloride, acetaminophen, labetalol, prochlorperazine, sodium chloride flush, traMADol, zolpidem     Results for orders placed or performed during the hospital encounter of 04/17/20 (from the past 48 hour(s))  Comprehensive metabolic panel     Status: Abnormal   Collection  Time: 04/17/20 10:39 AM  Result Value Ref Range   Sodium 141 135 - 145 mmol/L   Potassium 4.0 3.5 - 5.1 mmol/L   Chloride 104 98 - 111 mmol/L   CO2 27 22 - 32 mmol/L   Glucose, Bld 215 (H) 70 - 99 mg/dL    Comment: Glucose reference range applies only to samples taken after fasting for at least 8 hours.   BUN 52 (H) 8 - 23 mg/dL   Creatinine, Ser 2.49 (H) 0.44 - 1.00 mg/dL   Calcium 8.2 (L) 8.9 - 10.3 mg/dL   Total Protein 6.5 6.5 - 8.1 g/dL   Albumin 3.3 (L) 3.5 - 5.0 g/dL   AST 16 15 - 41 U/L   ALT 17 0 - 44 U/L   Alkaline Phosphatase 93 38 - 126 U/L   Total Bilirubin 1.0 0.3 - 1.2 mg/dL   GFR calc non Af Amer 19 (L) >60 mL/min   GFR calc Af Amer 22 (L) >60 mL/min   Anion gap 10 5 - 15    Comment: Performed at [facility], [address] [place], [place] [address]  Lipase, blood     Status: None   Collection Time: 04/17/20 10:39 AM  Result Value Ref Range   Lipase 35 11 - 51 U/L    Comment: Performed at [facility], [address] [place], [place] [address]  Troponin I (High Sensitivity)     Status: None   Collection Time: 04/17/20 10:39 AM  Result Value Ref Range   Troponin I (High Sensitivity) 16 <18 ng/L    Comment: (NOTE) Elevated high sensitivity troponin I (hsTnI) values and significant  changes across serial measurements may suggest ACS but many other  chronic and acute conditions are known to elevate hsTnI results.  Refer to the "Links" section for chest pain algorithms and additional  guidance. Performed at [facility], [address], [place], [place] [address]   CBC with Differential     Status: Abnormal   Collection Time: 04/17/20 10:39 AM  Result Value Ref Range   WBC 13.7 (H) 4.0 - 10.5 K/uL   RBC 3.38 (L) 3.87 - 5.11 MIL/uL   Hemoglobin 9.3 (L) 12.0 - 15.0 g/dL   HCT 29.9 (L)  36.0 - 46.0 %   MCV 88.5 80.0 - 100.0 fL   MCH 27.5 26.0 - 34.0 pg   MCHC 31.1 30.0 - 36.0 g/dL   RDW 14.5 11.5 - 15.5 %   Platelets 208 150 - 400 K/uL   nRBC 0.0 0.0 - 0.2 %    Neutrophils Relative % 62 %   Neutro Abs 8.6 (H) 1.7 - 7.7 K/uL   Lymphocytes Relative 26 %   Lymphs Abs 3.6 0.7 - 4.0 K/uL   Monocytes Relative 8 %   Monocytes Absolute 1.1 (H) 0.1 - 1.0 K/uL   Eosinophils Relative 3 %   Eosinophils Absolute 0.3 0.0 - 0.5 K/uL   Basophils Relative 0 %   Basophils Absolute 0.1 0.0 - 0.1 K/uL   Immature Granulocytes 1 %   Abs Immature Granulocytes 0.08 (H) 0.00 - 0.07 K/uL    Comment: Performed at [facility], [address] [place], [place] [address]  Brain natriuretic peptide     Status: Abnormal   Collection Time: 04/17/20 10:39 AM  Result Value Ref Range   B Natriuretic Peptide 650.0 (H) 0.0 - 100.0 pg/mL    Comment: Performed at [facility], [address], [place], [place] [address]  Procalcitonin - Baseline     Status: None   Collection Time: 04/17/20 10:39 AM  Result Value Ref Range   Procalcitonin <0.10 ng/mL    Comment:        Interpretation: PCT (Procalcitonin) <= 0.5 ng/mL: Systemic infection (sepsis) is not likely. Local bacterial infection is possible. (NOTE)       Sepsis PCT Algorithm           Lower Respiratory Tract                                      Infection PCT Algorithm    ----------------------------     ----------------------------         PCT < 0.25 ng/mL                PCT < 0.10 ng/mL         Strongly encourage             Strongly discourage   discontinuation of antibiotics    initiation of antibiotics    ----------------------------     -----------------------------       PCT 0.25 - 0.50 ng/mL            PCT 0.10 - 0.25 ng/mL               OR       >80% decrease in PCT            Discourage initiation of                                            antibiotics      Encourage discontinuation           of antibiotics    ----------------------------     -----------------------------         PCT >= 0.50 ng/mL              PCT 0.26 - 0.50 ng/mL               AND        <  80% decrease in PCT             Encourage  initiation of                                             antibiotics       Encourage continuation           of antibiotics    ----------------------------     -----------------------------        PCT >= 0.50 ng/mL                  PCT > 0.50 ng/mL               AND         increase in PCT                  Strongly encourage                                      initiation of antibiotics    Strongly encourage escalation           of antibiotics                                     -----------------------------                                           PCT <= 0.25 ng/mL                                                 OR                                        > 80% decrease in PCT                                     Discontinue / Do not initiate                                             antibiotics Performed at [facility], [address], [place], [place] [address]   TSH     Status: None   Collection Time: 04/17/20 10:39 AM  Result Value Ref Range   TSH 1.827 0.350 - 4.500 uIU/mL    Comment: Performed by a 3rd Generation assay with a functional sensitivity of <=0.01 uIU/mL. Performed at [facility], [address], [place], [place] [address]   Ammonia     Status: None   Collection Time: 04/17/20 11:25 AM  Result Value Ref Range   Ammonia 12 9 - 35 umol/L    Comment: Performed at [facility], [address],  [place], [place] [address]  Blood gas, venous (at [facility], not at [facility])     Status: Abnormal   Collection Time: 04/17/20 11:25 AM  Result Value Ref Range   FIO2 28.00    pH, Ven 7.384 7.250 - 7.430   pCO2, Ven 49.3 44.0 - 60.0 mmHg   pO2, Ven 40.9 32.0 - 45.0 mmHg   Bicarbonate 27.3 20.0 - 28.0 mmol/L   Acid-Base Excess 4.0 (H) 0.0 - 2.0 mmol/L   O2 Saturation 72.9 %   Patient temperature 37.0     Comment: Performed at [facility], [address], [place], [place] [address]  Troponin I (High Sensitivity)     Status: None   Collection Time: 04/17/20  1:03 PM  Result  Value Ref Range   Troponin I (High Sensitivity) 17 <18 ng/L    Comment: (NOTE) Elevated high sensitivity troponin I (hsTnI) values and significant  changes across serial measurements may suggest ACS but many other  chronic and acute conditions are known to elevate hsTnI results.  Refer to the "Links" section for chest pain algorithms and additional  guidance. Performed at [facility], [address] [place], [place] [address]   SARS Coronavirus 2 by RT PCR (hospital order, performed in [facility] hospital lab) Nasopharyngeal Nasopharyngeal Swab     Status: None   Collection Time: 04/17/20  1:26 PM   Specimen: Nasopharyngeal Swab  Result Value Ref Range   SARS Coronavirus 2 NEGATIVE NEGATIVE    Comment: (NOTE) SARS-CoV-2 target nucleic acids are NOT DETECTED. The SARS-CoV-2 RNA is generally detectable in upper and lower respiratory specimens during the acute phase of infection. The lowest concentration of SARS-CoV-2 viral copies this assay can detect is 250 copies / mL. A negative result does not preclude SARS-CoV-2 infection and should not be used as the sole basis for treatment or other patient management decisions.  A negative result may occur with improper specimen collection / handling, submission of specimen other than nasopharyngeal swab, presence of viral mutation(s) within the areas targeted by this assay, and inadequate number of viral copies (<250 copies / mL). A negative result must be combined with clinical observations, patient history, and epidemiological information. Fact Sheet for Patients:   StrictlyIdeas.no Fact Sheet for Healthcare Providers: BankingDealers.co.za This test is not yet approved or cleared  by the [place] FDA and has been authorized for detection and/or diagnosis of SARS-CoV-2 by FDA under an Emergency Use Authorization (EUA).  This EUA will remain in effect (meaning this test can be used) for  the duration of the COVID-19 declaration under Section 564(b)(1) of the Act, 21 U.S.C. section 360bbb-3(b)(1), unless the authorization is terminated or revoked sooner. Performed at [facility], [address] [place], [place] [address]   Urinalysis, Routine w reflex microscopic     Status: Abnormal   Collection Time: 04/17/20  3:19 PM  Result Value Ref Range   Color, Urine YELLOW YELLOW   APPearance CLEAR CLEAR   Specific Gravity, Urine 1.008 1.005 - 1.030   pH 6.0 5.0 - 8.0   Glucose, UA 150 (A) NEGATIVE mg/dL   Hgb urine dipstick SMALL (A) NEGATIVE   Bilirubin Urine NEGATIVE NEGATIVE   Ketones, ur NEGATIVE NEGATIVE mg/dL   Protein, ur 100 (A) NEGATIVE mg/dL   Nitrite NEGATIVE NEGATIVE   Leukocytes,Ua NEGATIVE NEGATIVE   RBC / HPF 0-5 0 - 5 RBC/hpf   WBC, UA 0-5 0 - 5 WBC/hpf   Bacteria, UA NONE SEEN NONE SEEN  Squamous Epithelial / LPF 0-5 0 - 5   Mucus PRESENT     Comment: Performed at [facility], [address], [place], [place] [address]  Glucose, capillary     Status: Abnormal   Collection Time: 04/17/20 11:17 PM  Result Value Ref Range   Glucose-Capillary 193 (H) 70 - 99 mg/dL    Comment: Glucose reference range applies only to samples taken after fasting for at least 8 hours.  Glucose, capillary     Status: Abnormal   Collection Time: 04/18/20  3:48 AM  Result Value Ref Range   Glucose-Capillary 153 (H) 70 - 99 mg/dL    Comment: Glucose reference range applies only to samples taken after fasting for at least 8 hours.  Basic metabolic panel     Status: Abnormal   Collection Time: 04/18/20  5:45 AM  Result Value Ref Range   Sodium 143 135 - 145 mmol/L   Potassium 3.7 3.5 - 5.1 mmol/L   Chloride 104 98 - 111 mmol/L   CO2 29 22 - 32 mmol/L   Glucose, Bld 194 (H) 70 - 99 mg/dL    Comment: Glucose reference range applies only to samples taken after fasting for at least 8 hours.   BUN 45 (H) 8 - 23 mg/dL   Creatinine, Ser 2.31 (H) 0.44 - 1.00 mg/dL   Calcium 8.1  (L) 8.9 - 10.3 mg/dL   GFR calc non Af Amer 21 (L) >60 mL/min   GFR calc Af Amer 24 (L) >60 mL/min   Anion gap 10 5 - 15    Comment: Performed at [facility], [address] [place], [place] [address]  CBC WITH DIFFERENTIAL     Status: Abnormal   Collection Time: 04/18/20  5:45 AM  Result Value Ref Range   WBC 10.9 (H) 4.0 - 10.5 K/uL   RBC 3.30 (L) 3.87 - 5.11 MIL/uL   Hemoglobin 9.1 (L) 12.0 - 15.0 g/dL   HCT 29.1 (L) 36.0 - 46.0 %   MCV 88.2 80.0 - 100.0 fL   MCH 27.6 26.0 - 34.0 pg   MCHC 31.3 30.0 - 36.0 g/dL   RDW 14.2 11.5 - 15.5 %   Platelets 167 150 - 400 K/uL   nRBC 0.0 0.0 - 0.2 %   Neutrophils Relative % 65 %   Neutro Abs 7.1 1.7 - 7.7 K/uL   Lymphocytes Relative 25 %   Lymphs Abs 2.7 0.7 - 4.0 K/uL   Monocytes Relative 7 %   Monocytes Absolute 0.8 0.1 - 1.0 K/uL   Eosinophils Relative 2 %   Eosinophils Absolute 0.2 0.0 - 0.5 K/uL   Basophils Relative 0 %   Basophils Absolute 0.0 0.0 - 0.1 K/uL   Immature Granulocytes 1 %   Abs Immature Granulocytes 0.06 0.00 - 0.07 K/uL    Comment: Performed at [facility], [address] [place], [place] [address]    Studies/Results:  BRAIN MRI FINDINGS: Brain: There is no acute infarction or intracranial hemorrhage. There is no intracranial mass, mass effect, or edema. There is no hydrocephalus or extra-axial fluid collection.  There are multiple chronic small vessel infarcts including involvement of bilateral basal ganglia and adjacent white matter as well as the right pons. There is hemosiderin deposition associated with the left basal ganglia infarct. Additional patchy T2 hyperintensity in the supratentorial white matter is nonspecific but may reflect mild chronic microvascular ischemic changes. Ventricles and sulci are within normal limits in size and configuration.  Vascular: Major  vessel flow voids at the skull base are preserved.  Skull and upper cervical spine: Normal marrow signal is  preserved.  Sinuses/Orbits: Trace mucosal thickening. Bilateral lens replacements.  Other: Incidental note is made of a partially empty sella. Trace mastoid fluid opacification.  IMPRESSION: No evidence of recent infarction, hemorrhage, or mass.  Stable chronic findings detailed above.    The brain MRI scan is reviewed in person and shows no acute changes on DWI.  There is mild increased signal seen on FLAIR imaging.  No hemorrhages noted.  There are few areas of small encephalomalacia involving the basal ganglia consistent with a remote lacunar infarcts.    Jahdiel Krol A. Merlene Laughter, M.D.  Diplomate, Tax adviser of Psychiatry and Neurology ( Neurology). 04/18/2020, 7:10 AM

## 2020-04-18 NOTE — Progress Notes (Signed)
Inpatient Diabetes Program Recommendations  AACE/ADA: New Consensus Statement on Inpatient Glycemic Control (2015)  Target Ranges:  Prepandial:   less than 140 mg/dL      Peak postprandial:   less than 180 mg/dL (1-2 hours)      Critically ill patients:  140 - 180 mg/dL   Lab Results  Component Value Date   GLUCAP 308 (H) 04/18/2020   HGBA1C 8.4 (H) 04/17/2020    Review of Glycemic Control Results for IVIS, NICOLSON (MRN [id number]) as of 04/18/2020 14:09  Ref. Range 04/18/2020 03:48 04/18/2020 08:40 04/18/2020 12:07  Glucose-Capillary Latest Ref Range: 70 - 99 mg/dL 153 (H) 197 (H) 308 (H)   Diabetes history: Type 2 DM Outpatient Diabetes medications: Lantus 27 units QHS, Humalog 1-17 units TID, Tradjenta 5 mg QAM Current orders for Inpatient glycemic control: Lantus 24 units QHS, Novolog 0-9 units TID Florinef 200 mcg QD  Inpatient Diabetes Program Recommendations:    In the setting of steroids, consider adding Novolog 4 units TID (assuming patient is consuming >50% of meal).   Thanks, Bronson Curb, MSN, RNC-OB Diabetes Coordinator [phone] (8a-5p)

## 2020-04-18 NOTE — Evaluation (Signed)
Physical Therapy Evaluation Patient Details Name: Victoria Winters MRN: [id number] DOB: [birth year] Today's Date: 04/18/2020   History of Present Illness  Victoria Winters is a 69 y.o. 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  Pt had been very weak since last night.  Also she noticed that the patient was wheezing and had been complaining of shortness of breath.  Pt denies chest pain.  Pt has had no fever or chills.    Clinical Impression  Patient functioning near baseline for functional mobility and gait, demonstrates labored movement for sitting up at bedside, slow slightly labored cadence during ambulation without loss of balance, limited mostly due to c/o fatigue, on room air with SpO2 dropping from 91% to 88%, once seated SpO2 increased to 93-94% and patient tolerated staying up in chair after therapy - NT notified.  Patient will benefit from continued physical therapy in hospital and recommended venue below to increase strength, balance, endurance for safe ADLs and gait.     Follow Up Recommendations Home health PT;Supervision for mobility/OOB;Supervision - Intermittent    Equipment Recommendations  None recommended by PT    Recommendations for Other Services       Precautions / Restrictions Precautions Precautions: Fall Restrictions Weight Bearing Restrictions: No      Mobility  Bed Mobility Overal bed mobility: Needs Assistance Bed Mobility: Supine to Sit     Supine to sit: Supervision     General bed mobility comments: increased time, labored movement  Transfers Overall transfer level: Needs assistance Equipment used: Rolling walker (2 wheeled) Transfers: Sit to/from [facility] Sit to Stand: Min guard Stand pivot  transfers: Min guard       General transfer comment: slightly labored movement, increased time  Ambulation/Gait Ambulation/Gait assistance: Min guard;Min assist Gait Distance (Feet): 35 Feet Assistive device: Rolling walker (2 wheeled) Gait Pattern/deviations: Decreased step length - right;Decreased step length - left;Decreased stride length Gait velocity: decreased   General Gait Details: slow slightly labored cadence without loss of balance, limited secondary to c/o fatigue  Stairs            Wheelchair Mobility    Modified Rankin (Stroke Patients Only)       Balance Overall balance assessment: Needs assistance Sitting-balance support: Feet supported;No upper extremity supported Sitting balance-Leahy Scale: Good Sitting balance - Comments: seated at EOB   Standing balance support: During functional activity;Bilateral upper extremity supported Standing balance-Leahy Scale: Fair Standing balance comment: using RW                             Pertinent Vitals/Pain Pain Assessment: Faces Faces Pain Scale: Hurts a little bit Pain Location: left shoulder Pain Descriptors / Indicators: Sore;Discomfort Pain Intervention(s): Limited activity within patient's tolerance;Monitored during session    Home Living Family/patient expects to be discharged to:: Private residence Living Arrangements: Other relatives Available Help at Discharge: Family;Available 24 hours/day Type of Home: House Home Access: Ramped entrance     Home Layout: Multi-level;Able to live on main level with bedroom/bathroom Home Equipment: Shower seat;Hand held shower head;Grab bars - tub/shower;Grab bars - toilet;Wheelchair - [employer] - 2 wheels Additional Comments: chair lift    Prior Function Level of Independence: Needs assistance   Gait /  Transfers Assistance Needed: household ambulator using RW  ADL's / Homemaking Assistance Needed: assisted by family        Hand  Dominance   Dominant Hand: Left    Extremity/Trunk Assessment   Upper Extremity Assessment Upper Extremity Assessment: Generalized weakness    Lower Extremity Assessment Lower Extremity Assessment: Generalized weakness    Cervical / Trunk Assessment Cervical / Trunk Assessment: Normal  Communication   Communication: No difficulties  Cognition Arousal/Alertness: Awake/alert Behavior During Therapy: WFL for tasks assessed/performed Overall Cognitive Status: Within Functional Limits for tasks assessed                                        General Comments      Exercises     Assessment/Plan    PT Assessment Patient needs continued PT services  PT Problem List Decreased strength;Decreased activity tolerance;Decreased balance;Decreased mobility       PT Treatment Interventions Gait training;Stair training;Functional mobility training;Therapeutic activities;Therapeutic exercise;Patient/family education    PT Goals (Current goals can be found in the Care Plan section)  Acute Rehab PT Goals Patient Stated Goal: return home with family to assist PT Goal Formulation: With patient Time For Goal Achievement: 04/20/20 Potential to Achieve Goals: Good    Frequency Min 3X/week   Barriers to discharge        Co-evaluation               AM-PAC PT "6 Clicks" Mobility  Outcome Measure Help needed turning from your back to your side while in a flat bed without using bedrails?: None Help needed moving from lying on your back to sitting on the side of a flat bed without using bedrails?: A Little Help needed moving to and from a bed to a chair (including a wheelchair)?: A Little Help needed standing up from a chair using your arms (e.g., wheelchair or bedside chair)?: A Little Help needed to walk in hospital room?: A Little Help needed climbing 3-5 steps with a railing? : A Lot 6 Click Score: 18    End of Session   Activity Tolerance: Patient tolerated  treatment well;Patient limited by fatigue Patient left: in chair;with call bell/phone within reach;with chair alarm set Nurse Communication: Mobility status PT Visit Diagnosis: Unsteadiness on feet (R26.81);Other abnormalities of gait and mobility (R26.89);Muscle weakness (generalized) (M62.81)    Time: 4765-4650 PT Time Calculation (min) (ACUTE ONLY): 29 min   Charges:   PT Evaluation $PT Eval Moderate Complexity: 1 Mod PT Treatments $Therapeutic Activity: 23-37 mins        2:04 PM, 04/18/20 Lonell Grandchild, MPT Physical Therapist with [facility] 336 [phone] office [phone] mobile phone

## 2020-04-18 NOTE — Care Management CC44 (Signed)
Condition Code 44 Documentation Completed  Patient Details  Name: Victoria Winters MRN: [id number] Date of Birth: 1951-11-12   Condition Code 44 given:  Yes Patient signature on Condition Code 44 notice:  Yes Documentation of 2 MD's agreement:  Yes Code 44 added to claim:  Yes    Ihor Gully, LCSW 04/18/2020, 3:19 PM

## 2020-04-18 NOTE — Progress Notes (Signed)
  Echocardiogram 2D Echocardiogram has been performed.  Victoria Winters 04/18/2020, 9:20 AM

## 2020-04-18 NOTE — Care Management Important Message (Signed)
Important Message  Patient Details  Name: Victoria Winters MRN: [id number] Date of Birth: 1951/10/31   Medicare Important Message Given:  Yes     Ihor Gully, LCSW 04/18/2020, 3:18 PM

## 2020-04-22 ENCOUNTER — Encounter (HOSPITAL_COMMUNITY): Payer: Self-pay

## 2020-04-22 ENCOUNTER — Inpatient Hospital Stay (HOSPITAL_COMMUNITY)
Admission: EM | Admit: 2020-04-22 | Discharge: 2020-04-25 | DRG: 291 | Disposition: A | Discharge status: Skilled Nursing Facility | Payer: 59 | Attending: Family Medicine | Admitting: Family Medicine

## 2020-04-22 ENCOUNTER — Other Ambulatory Visit: Payer: Self-pay

## 2020-04-22 ENCOUNTER — Emergency Department (HOSPITAL_COMMUNITY): Payer: 59

## 2020-04-22 DIAGNOSIS — Z20822 Contact with and (suspected) exposure to covid-19: Secondary | ICD-10-CM | POA: Diagnosis present

## 2020-04-22 DIAGNOSIS — I509 Heart failure, unspecified: Secondary | ICD-10-CM | POA: Diagnosis not present

## 2020-04-22 DIAGNOSIS — Z79899 Other long term (current) drug therapy: Secondary | ICD-10-CM | POA: Diagnosis not present

## 2020-04-22 DIAGNOSIS — R4189 Other symptoms and signs involving cognitive functions and awareness: Secondary | ICD-10-CM | POA: Diagnosis present

## 2020-04-22 DIAGNOSIS — I5043 Acute on chronic combined systolic (congestive) and diastolic (congestive) heart failure: Secondary | ICD-10-CM | POA: Diagnosis present

## 2020-04-22 DIAGNOSIS — Z8673 Personal history of transient ischemic attack (TIA), and cerebral infarction without residual deficits: Secondary | ICD-10-CM

## 2020-04-22 DIAGNOSIS — Z7982 Long term (current) use of aspirin: Secondary | ICD-10-CM

## 2020-04-22 DIAGNOSIS — Z66 Do not resuscitate: Secondary | ICD-10-CM | POA: Diagnosis present

## 2020-04-22 DIAGNOSIS — I671 Cerebral aneurysm, nonruptured: Secondary | ICD-10-CM | POA: Diagnosis present

## 2020-04-22 DIAGNOSIS — N189 Chronic kidney disease, unspecified: Secondary | ICD-10-CM | POA: Diagnosis present

## 2020-04-22 DIAGNOSIS — I639 Cerebral infarction, unspecified: Secondary | ICD-10-CM | POA: Diagnosis present

## 2020-04-22 DIAGNOSIS — I5033 Acute on chronic diastolic (congestive) heart failure: Secondary | ICD-10-CM | POA: Diagnosis not present

## 2020-04-22 DIAGNOSIS — Z8679 Personal history of other diseases of the circulatory system: Secondary | ICD-10-CM

## 2020-04-22 DIAGNOSIS — Z91018 Allergy to other foods: Secondary | ICD-10-CM | POA: Diagnosis not present

## 2020-04-22 DIAGNOSIS — N185 Chronic kidney disease, stage 5: Secondary | ICD-10-CM

## 2020-04-22 DIAGNOSIS — E1165 Type 2 diabetes mellitus with hyperglycemia: Secondary | ICD-10-CM | POA: Diagnosis present

## 2020-04-22 DIAGNOSIS — F329 Major depressive disorder, single episode, unspecified: Secondary | ICD-10-CM | POA: Diagnosis present

## 2020-04-22 DIAGNOSIS — R609 Edema, unspecified: Secondary | ICD-10-CM

## 2020-04-22 DIAGNOSIS — Z833 Family history of diabetes mellitus: Secondary | ICD-10-CM

## 2020-04-22 DIAGNOSIS — J9601 Acute respiratory failure with hypoxia: Secondary | ICD-10-CM | POA: Diagnosis present

## 2020-04-22 DIAGNOSIS — I130 Hypertensive heart and chronic kidney disease with heart failure and stage 1 through stage 4 chronic kidney disease, or unspecified chronic kidney disease: Secondary | ICD-10-CM | POA: Diagnosis present

## 2020-04-22 DIAGNOSIS — Z6841 Body Mass Index (BMI) 40.0 and over, adult: Secondary | ICD-10-CM

## 2020-04-22 DIAGNOSIS — N184 Chronic kidney disease, stage 4 (severe): Secondary | ICD-10-CM | POA: Diagnosis present

## 2020-04-22 DIAGNOSIS — E1122 Type 2 diabetes mellitus with diabetic chronic kidney disease: Secondary | ICD-10-CM | POA: Diagnosis present

## 2020-04-22 DIAGNOSIS — E2740 Unspecified adrenocortical insufficiency: Secondary | ICD-10-CM | POA: Diagnosis present

## 2020-04-22 DIAGNOSIS — Z880 Allergy status to penicillin: Secondary | ICD-10-CM

## 2020-04-22 DIAGNOSIS — Z9109 Other allergy status, other than to drugs and biological substances: Secondary | ICD-10-CM

## 2020-04-22 DIAGNOSIS — Z794 Long term (current) use of insulin: Secondary | ICD-10-CM

## 2020-04-22 DIAGNOSIS — Z87891 Personal history of nicotine dependence: Secondary | ICD-10-CM | POA: Diagnosis not present

## 2020-04-22 DIAGNOSIS — D631 Anemia in chronic kidney disease: Secondary | ICD-10-CM | POA: Diagnosis present

## 2020-04-22 DIAGNOSIS — Z91012 Allergy to eggs: Secondary | ICD-10-CM

## 2020-04-22 DIAGNOSIS — Z87440 Personal history of urinary (tract) infections: Secondary | ICD-10-CM | POA: Diagnosis not present

## 2020-04-22 DIAGNOSIS — Z91011 Allergy to milk products: Secondary | ICD-10-CM

## 2020-04-22 DIAGNOSIS — Z91041 Radiographic dye allergy status: Secondary | ICD-10-CM

## 2020-04-22 LAB — CBC WITH DIFFERENTIAL/PLATELET
Abs Immature Granulocytes: 0.07 10*3/uL (ref 0.00–0.07)
Basophils Absolute: 0.1 10*3/uL (ref 0.0–0.1)
Basophils Relative: 1 %
Eosinophils Absolute: 1.2 10*3/uL — ABNORMAL HIGH (ref 0.0–0.5)
Eosinophils Relative: 10 %
HCT: 27.7 % — ABNORMAL LOW (ref 36.0–46.0)
Hemoglobin: 8.7 g/dL — ABNORMAL LOW (ref 12.0–15.0)
Immature Granulocytes: 1 %
Lymphocytes Relative: 30 %
Lymphs Abs: 3.8 10*3/uL (ref 0.7–4.0)
MCH: 27.6 pg (ref 26.0–34.0)
MCHC: 31.4 g/dL (ref 30.0–36.0)
MCV: 87.9 fL (ref 80.0–100.0)
Monocytes Absolute: 0.8 10*3/uL (ref 0.1–1.0)
Monocytes Relative: 6 %
Neutro Abs: 6.7 10*3/uL (ref 1.7–7.7)
Neutrophils Relative %: 52 %
Platelets: 209 10*3/uL (ref 150–400)
RBC: 3.15 MIL/uL — ABNORMAL LOW (ref 3.87–5.11)
RDW: 14.4 % (ref 11.5–15.5)
WBC: 12.6 10*3/uL — ABNORMAL HIGH (ref 4.0–10.5)
nRBC: 0 % (ref 0.0–0.2)

## 2020-04-22 LAB — GLUCOSE, CAPILLARY: Glucose-Capillary: 192 mg/dL — ABNORMAL HIGH (ref 70–99)

## 2020-04-22 LAB — COMPREHENSIVE METABOLIC PANEL
ALT: 14 U/L (ref 0–44)
AST: 20 U/L (ref 15–41)
Albumin: 3.1 g/dL — ABNORMAL LOW (ref 3.5–5.0)
Alkaline Phosphatase: 85 U/L (ref 38–126)
Anion gap: 11 (ref 5–15)
BUN: 54 mg/dL — ABNORMAL HIGH (ref 8–23)
CO2: 25 mmol/L (ref 22–32)
Calcium: 8.7 mg/dL — ABNORMAL LOW (ref 8.9–10.3)
Chloride: 105 mmol/L (ref 98–111)
Creatinine, Ser: 2.4 mg/dL — ABNORMAL HIGH (ref 0.44–1.00)
GFR calc Af Amer: 23 mL/min — ABNORMAL LOW (ref >60)
GFR calc non Af Amer: 20 mL/min — ABNORMAL LOW (ref >60)
Glucose, Bld: 173 mg/dL — ABNORMAL HIGH (ref 70–99)
Potassium: 4.1 mmol/L (ref 3.5–5.1)
Sodium: 141 mmol/L (ref 135–145)
Total Bilirubin: 1 mg/dL (ref 0.3–1.2)
Total Protein: 6.2 g/dL — ABNORMAL LOW (ref 6.5–8.1)

## 2020-04-22 LAB — TROPONIN I (HIGH SENSITIVITY)
Troponin I (High Sensitivity): 12 ng/L (ref <18)
Troponin I (High Sensitivity): 13 ng/L (ref <18)

## 2020-04-22 LAB — SARS CORONAVIRUS 2 BY RT PCR (HOSPITAL ORDER, PERFORMED IN ~~LOC~~ HOSPITAL LAB): SARS Coronavirus 2: NEGATIVE

## 2020-04-22 LAB — BRAIN NATRIURETIC PEPTIDE: B Natriuretic Peptide: 769 pg/mL — ABNORMAL HIGH (ref 0.0–100.0)

## 2020-04-22 LAB — D-DIMER, QUANTITATIVE: D-Dimer, Quant: 2.5 ug/mL-FEU — ABNORMAL HIGH (ref 0.00–0.50)

## 2020-04-22 MED ORDER — LORATADINE 10 MG PO TABS
10.0000 mg | ORAL_TABLET | Freq: Every morning | ORAL | Status: DC
  Administered 2020-04-23 – 2020-04-25 (×3): 10 mg via ORAL
  Filled 2020-04-22 (×3): qty 1

## 2020-04-22 MED ORDER — SODIUM CHLORIDE 0.9% FLUSH: 3.0000 mL | INTRAVENOUS | Status: DC | PRN

## 2020-04-22 MED ORDER — TECHNETIUM TO 99M ALBUMIN AGGREGATED
1.5300 | Freq: Once | INTRAVENOUS | Status: AC | PRN
  Administered 2020-04-22: 1.53 via INTRAVENOUS

## 2020-04-22 MED ORDER — ALBUTEROL SULFATE HFA 108 (90 BASE) MCG/ACT IN AERS
8.0000 | INHALATION_SPRAY | Freq: Once | RESPIRATORY_TRACT | Status: AC
  Administered 2020-04-22: 8 via RESPIRATORY_TRACT
  Filled 2020-04-22: qty 6.7

## 2020-04-22 MED ORDER — HYDRALAZINE HCL 25 MG PO TABS
25.0000 mg | ORAL_TABLET | Freq: Three times a day (TID) | ORAL | Status: DC
  Administered 2020-04-22 – 2020-04-25 (×9): 25 mg via ORAL
  Filled 2020-04-22 (×9): qty 1

## 2020-04-22 MED ORDER — ISOSORBIDE MONONITRATE ER 60 MG PO TB24
30.0000 mg | ORAL_TABLET | Freq: Every day | ORAL | Status: DC
  Administered 2020-04-23 – 2020-04-25 (×3): 30 mg via ORAL
  Filled 2020-04-22 (×3): qty 1

## 2020-04-22 MED ORDER — FLUDROCORTISONE ACETATE 0.1 MG PO TABS
200.0000 ug | ORAL_TABLET | Freq: Every day | ORAL | Status: DC
  Administered 2020-04-23 – 2020-04-25 (×3): 200 ug via ORAL
  Filled 2020-04-22 (×3): qty 2

## 2020-04-22 MED ORDER — ESCITALOPRAM OXALATE 10 MG PO TABS
10.0000 mg | ORAL_TABLET | Freq: Every morning | ORAL | Status: DC
  Administered 2020-04-23 – 2020-04-25 (×3): 10 mg via ORAL
  Filled 2020-04-22 (×3): qty 1

## 2020-04-22 MED ORDER — SODIUM CHLORIDE 0.9 % IV SOLN: 250.0000 mL | INTRAVENOUS | Status: DC | PRN

## 2020-04-22 MED ORDER — SODIUM CHLORIDE 0.9% FLUSH
3.0000 mL | Freq: Two times a day (BID) | INTRAVENOUS | Status: DC
  Administered 2020-04-22 – 2020-04-25 (×6): 3 mL via INTRAVENOUS

## 2020-04-22 MED ORDER — PREGABALIN 75 MG PO CAPS
75.0000 mg | ORAL_CAPSULE | Freq: Two times a day (BID) | ORAL | Status: DC
  Administered 2020-04-22 – 2020-04-25 (×6): 75 mg via ORAL
  Filled 2020-04-22 (×6): qty 1

## 2020-04-22 MED ORDER — TECHNETIUM TC 99M DIETHYLENETRIAME-PENTAACETIC ACID
44.0000 | Freq: Once | INTRAVENOUS | Status: AC | PRN
  Administered 2020-04-22: 44 via RESPIRATORY_TRACT

## 2020-04-22 MED ORDER — QUETIAPINE FUMARATE 25 MG PO TABS
25.0000 mg | ORAL_TABLET | Freq: Every day | ORAL | Status: DC
  Administered 2020-04-22 – 2020-04-24 (×3): 25 mg via ORAL
  Filled 2020-04-22 (×3): qty 1

## 2020-04-22 MED ORDER — ADULT MULTIVITAMIN W/MINERALS CH
1.0000 | ORAL_TABLET | Freq: Every day | ORAL | Status: DC
  Administered 2020-04-23 – 2020-04-25 (×3): 1 via ORAL
  Filled 2020-04-22 (×3): qty 1

## 2020-04-22 MED ORDER — METOPROLOL SUCCINATE ER 50 MG PO TB24
100.0000 mg | ORAL_TABLET | Freq: Every morning | ORAL | Status: DC
  Administered 2020-04-23 – 2020-04-25 (×3): 100 mg via ORAL
  Filled 2020-04-22 (×3): qty 2

## 2020-04-22 MED ORDER — HEPARIN SODIUM (PORCINE) 5000 UNIT/ML IJ SOLN
5000.0000 [IU] | Freq: Three times a day (TID) | INTRAMUSCULAR | Status: DC
  Administered 2020-04-22 – 2020-04-25 (×8): 5000 [IU] via SUBCUTANEOUS
  Filled 2020-04-22 (×8): qty 1

## 2020-04-22 MED ORDER — FUROSEMIDE 10 MG/ML IJ SOLN
40.0000 mg | Freq: Once | INTRAMUSCULAR | Status: AC
  Administered 2020-04-22: 40 mg via INTRAVENOUS
  Filled 2020-04-22: qty 4

## 2020-04-22 MED ORDER — INSULIN ASPART 100 UNIT/ML ~~LOC~~ SOLN
0.0000 [IU] | Freq: Three times a day (TID) | SUBCUTANEOUS | Status: DC
  Administered 2020-04-23: 2 [IU] via SUBCUTANEOUS
  Administered 2020-04-23: 1 [IU] via SUBCUTANEOUS
  Administered 2020-04-23: 2 [IU] via SUBCUTANEOUS
  Administered 2020-04-24: 1 [IU] via SUBCUTANEOUS
  Administered 2020-04-24: 3 [IU] via SUBCUTANEOUS
  Administered 2020-04-24: 4 [IU] via SUBCUTANEOUS
  Administered 2020-04-25: 2 [IU] via SUBCUTANEOUS
  Administered 2020-04-25: 4 [IU] via SUBCUTANEOUS

## 2020-04-22 MED ORDER — HYDRALAZINE HCL 25 MG PO TABS
25.0000 mg | ORAL_TABLET | Freq: Once | ORAL | Status: AC
  Administered 2020-04-22: 25 mg via ORAL
  Filled 2020-04-22: qty 1

## 2020-04-22 MED ORDER — FUROSEMIDE 10 MG/ML IJ SOLN
60.0000 mg | Freq: Two times a day (BID) | INTRAMUSCULAR | Status: DC
  Administered 2020-04-22 – 2020-04-25 (×6): 60 mg via INTRAVENOUS
  Filled 2020-04-22 (×7): qty 6

## 2020-04-22 MED ORDER — ATORVASTATIN CALCIUM 20 MG PO TABS
20.0000 mg | ORAL_TABLET | Freq: Every day | ORAL | Status: DC
  Administered 2020-04-23 – 2020-04-25 (×3): 20 mg via ORAL
  Filled 2020-04-22 (×3): qty 1

## 2020-04-22 MED ORDER — ACETAMINOPHEN 325 MG PO TABS: 650.0000 mg | ORAL_TABLET | Freq: Four times a day (QID) | ORAL | Status: DC | PRN

## 2020-04-22 MED ORDER — ASPIRIN EC 81 MG PO TBEC
81.0000 mg | DELAYED_RELEASE_TABLET | Freq: Every day | ORAL | Status: DC
  Administered 2020-04-23 – 2020-04-25 (×3): 81 mg via ORAL
  Filled 2020-04-22 (×3): qty 1

## 2020-04-22 NOTE — ED Triage Notes (Signed)
Pt reports chest pain and sob since yesterday.  Denies fever.

## 2020-04-22 NOTE — ED Notes (Signed)
Pt bvwalked room to end of hall. O2 between 90-92% did drop to 88 breifly

## 2020-04-22 NOTE — ED Notes (Signed)
Pt placed on 2 L [place] . Oxygen 88% on room air

## 2020-04-22 NOTE — ED Provider Notes (Signed)
  Face-to-face evaluation   History: She presents for evaluation of shortness of breath.  She is unable to specify how long it has been there.  Physical exam: Alert, calm cooperative.  Lungs with somewhat raspy noises but no audible wheezes rales or rhonchi.  There is no increased work of breathing.  Medical screening examination/treatment/procedure(s) were conducted as a shared visit with non-physician practitioner(s) and myself.  I personally evaluated the patient during the encounter    Daleen Bo, MD 04/23/20 [phone]

## 2020-04-22 NOTE — ED Provider Notes (Signed)
[facility] EMERGENCY DEPARTMENT Provider Note   CSN: [id number] Arrival date & time: 04/22/20  7342     History Chief Complaint  Patient presents with  . Shortness of Breath    Victoria Winters is a 69 y.o. female.  The history is provided by the patient. No language interpreter was used.  Shortness of Breath Severity:  Moderate Onset quality:  Gradual Timing:  Constant Progression:  Worsening Chronicity:  New Relieved by:  Nothing Worsened by:  Nothing Ineffective treatments:  None tried Associated symptoms: no fever and no hemoptysis   Pt complains of shortness of breath and swelling in her legs.  Pt reports she is on a fluid medications.  Pt reports she feels like she has excess fluid      Past Medical History:  Diagnosis Date  . Brain aneurysm   . Diabetes mellitus without complication ([place])   . Hallucinations    visual and auditory  . Memory loss   . Recurrent UTI   . Renal disorder   . Stage 5 chronic kidney disease ([place])   . Stroke [facility])     Patient Active Problem List   Diagnosis Date Noted  . Acute and chronic respiratory failure with hypoxia ([place]) 04/17/2020  . DNR (do not resuscitate) 04/17/2020  . Leukocytosis 04/17/2020  . Anemia in chronic kidney disease (CKD) 04/17/2020  . Closed fracture of distal end of right fibula with routine healing 11/23/2019  . Palliative care by specialist   . Goals of care, counseling/discussion   . History of CVA (cerebrovascular accident)   . Acute encephalopathy 07/21/2019  . Fall 07/20/2019  . 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 Brain aneurysm 07/20/2019  . DM (diabetes mellitus) ([place]) 07/20/2019  . Bilateral fibular fractures 07/20/2019  . CVA (cerebral vascular accident) ([place]) 07/20/2019  . Hallucination 07/20/2019    Past Surgical History:  Procedure Laterality Date  . CHOLECYSTECTOMY    . SKIN GRAFT       OB History    Gravida  2   Para  2   Term  2   Preterm      AB      Living        SAB      TAB      Ectopic      Multiple      Live Births              Family History  Problem Relation Age of Onset  . Heart disease Mother   . Diabetes Mellitus II Mother   . Heart disease Father   . Diabetes Father   . Heart disease Brother   . Cancer Other     Social History   Tobacco Use  . Smoking status: Former Research scientist (life sciences)  . Smokeless tobacco: Never Used  Substance Use Topics  . Alcohol use: Yes    Comment: rarely  . Drug use: Never    Home Medications Prior to Admission medications   Medication Sig Start Date End Date Taking? Authorizing Provider  aspirin EC 81 MG tablet Take 81 mg by mouth daily.   Yes [provider]  atorvastatin (LIPITOR) 20 MG tablet Take 20 mg by mouth daily. 09/29/19  Yes [provider]  cetirizine (ZYRTEC) 5 MG tablet Take 5 mg by mouth daily.   Yes [provider]  doxycycline (VIBRA-TABS) 100 MG tablet Take 100 mg by mouth daily. 04/18/20  Yes [provider]  escitalopram (LEXAPRO) 10 MG  tablet Take 10 mg by mouth every morning.   Yes [provider]  fludrocortisone (FLORINEF) 0.1 MG tablet Take 2 tablets by mouth daily. 03/19/20  Yes [provider]  furosemide (LASIX) 40 MG tablet Take 40 mg by mouth daily. 10/03/19  Yes [provider]  hydrALAZINE (APRESOLINE) 25 MG tablet Take 25 mg by mouth 3 (three) times daily.   Yes [provider]  insulin glargine (LANTUS) 100 UNIT/ML injection Inject 0.1 mLs (10 Units total) into the skin at bedtime. Patient taking differently: Inject 27 Units into the skin at bedtime.  07/29/19  Yes Kathie Dike, MD  insulin lispro (HUMALOG) 100 UNIT/ML injection Inject 1-17 Units into the skin 3 (three) times daily before meals. Sliding scale starting at; 180-220= 4 units 221-260= 6 units 261-300= 8 units 301-340=10units 341-380=12units   Yes [provider]  isosorbide mononitrate (IMDUR) 30 MG 24 hr tablet Take  30 mg by mouth daily as needed.    Yes [provider]  linagliptin (TRADJENTA) 5 MG TABS tablet Take 5 mg by mouth every morning.   Yes [provider]  loratadine (CLARITIN) 10 MG tablet Take 10 mg by mouth every morning.   Yes [provider]  Melatonin 1 MG TABS Take 5 mg by mouth at bedtime.    Yes [provider]  metoprolol succinate (TOPROL-XL) 100 MG 24 hr tablet Take 100 mg by mouth every morning.    Yes [provider]  midodrine (PROAMATINE) 5 MG tablet Take 10 mg by mouth in the morning, at noon, and at bedtime. 02/23/20  Yes [provider]  Multiple Vitamin (MULTIVITAMIN WITH MINERALS) TABS tablet Take 1 tablet by mouth daily.   Yes [provider]  pregabalin (LYRICA) 75 MG capsule Take 75 mg by mouth 2 (two) times daily.  10/23/19  Yes [provider]  QUEtiapine (SEROQUEL) 25 MG tablet Take 1 tablet (25 mg total) by mouth at bedtime. 07/29/19  Yes Kathie Dike, MD  traMADol (ULTRAM) 50 MG tablet Take 1 tablet (50 mg total) by mouth every 6 (six) hours as needed. 09/05/19  Yes Lily Kocher, PA-C    Allergies    Penicillins, Chicken allergy, Contrast media [iodinated diagnostic agents], Eggs or egg-derived products, Milk-related compounds, Nickel, Oatmeal, Other, and Wheat bran  Review of Systems   Review of Systems  Constitutional: Negative for fever.  Respiratory: Positive for shortness of breath. Negative for hemoptysis.   All other systems reviewed and are negative.   Physical Exam Updated Vital Signs BP (!) 195/71   Pulse (!) 58   Temp 97.8 F (36.6 C) (Oral)   Resp (!) 22   Ht 5\' 1"  (1.549 m)   Wt 99 kg   SpO2 99%   BMI 41.24 kg/m   Physical Exam Vitals and nursing note reviewed.  Constitutional:      Appearance: She is well-developed.  HENT:     Head: Normocephalic.  Cardiovascular:     Rate and Rhythm: Normal rate.  Pulmonary:     Effort: Respiratory distress present.      Breath sounds: Decreased breath sounds present.  Chest:     Chest wall: Edema present. No tenderness.  Abdominal:     General: There is no distension.     Palpations: Abdomen is soft.  Musculoskeletal:        General: Normal range of motion.     Cervical back: Normal range of motion.     Right lower leg: Edema  present.     Left lower leg: Edema present.  Skin:    General: Skin is warm.  Neurological:     Mental Status: She is alert and oriented to person, place, and time.  Psychiatric:        Mood and Affect: Mood normal.     ED Results / Procedures / Treatments   Labs (all labs ordered are listed, but only abnormal results are displayed) Labs Reviewed  CBC WITH DIFFERENTIAL/PLATELET - Abnormal; Notable for the following components:      Result Value   WBC 12.6 (*)    RBC 3.15 (*)    Hemoglobin 8.7 (*)    HCT 27.7 (*)    Eosinophils Absolute 1.2 (*)    All other components within normal limits  COMPREHENSIVE METABOLIC PANEL - Abnormal; Notable for the following components:   Glucose, Bld 173 (*)    BUN 54 (*)    Creatinine, Ser 2.40 (*)    Calcium 8.7 (*)    Total Protein 6.2 (*)    Albumin 3.1 (*)    GFR calc non Af Amer 20 (*)    GFR calc Af Amer 23 (*)    All other components within normal limits  BRAIN NATRIURETIC PEPTIDE - Abnormal; Notable for the following components:   B Natriuretic Peptide 769.0 (*)    All other components within normal limits  D-DIMER, QUANTITATIVE (NOT AT [facility]) - Abnormal; Notable for the following components:   D-Dimer, Quant 2.50 (*)    All other components within normal limits  SARS CORONAVIRUS 2 BY RT PCR (HOSPITAL ORDER, [facility])  TROPONIN I (HIGH SENSITIVITY)  TROPONIN I (HIGH SENSITIVITY)    EKG EKG Interpretation  Date/Time:  Monday Apr 22 2020 09:40:11 EDT Ventricular Rate:  62 PR Interval:    QRS Duration: 98 QT Interval:  540 QTC Calculation: 549 R Axis:   99 Text Interpretation: Sinus  rhythm Probable left atrial enlargement Right axis deviation Borderline T abnormalities, diffuse leads Prolonged QT interval Since last tracing QT has lengthened Otherwise no significant change Confirmed by Daleen Bo [phone]) on 04/22/2020 9:55:31 AM   Radiology DG Chest 2 View  Result Date: 04/22/2020 CLINICAL DATA:  Shortness of breath, chest pain. Additional history provided: Patient presents with chest pain and shortness of breath since yesterday, denies fever, history of diabetes and CKD, COVID negative Apr 17, 2020 EXAM: CHEST - 2 VIEW COMPARISON:  Prior chest radiograph 04/17/2020 FINDINGS: Unchanged cardiomegaly. Central pulmonary vascular congestion. Prominence of the interstitial lung markings bilaterally. Small bilateral pleural effusions. No evidence of pneumothorax. No acute bony abnormality is identified. IMPRESSION: Unchanged cardiomegaly with central pulmonary vascular congestion. Bilateral interstitial prominence likely reflects interstitial edema. Atypical/viral pneumonia cannot be excluded. Small bilateral pleural effusions. Electronically Signed   By: Kellie Simmering DO   On: 04/22/2020 10:19    Procedures Procedures (including critical care time)  Medications Ordered in ED Medications  albuterol (VENTOLIN HFA) 108 (90 Base) MCG/ACT inhaler 8 puff (8 puffs Inhalation Given 04/22/20 1107)  furosemide (LASIX) injection 40 mg (40 mg Intravenous Given 04/22/20 1108)    ED Course  I have reviewed the triage vital signs and the nursing notes.  Pertinent labs & imaging results that were available during my care of the patient were reviewed by me and considered in my medical decision making (see chart for details).  Clinical Course as of Apr 22 1601  Mon Apr 22, 2020  1045 Comprehensive metabolic panel(!) [  LS]    Clinical Course User Index [LS] Sidney Ace   MDM Rules/Calculators/A&P                      MDM  Pt reports she feels better after IV lasix and  albuterol.  Pt has had approximately 900cc of urine output  Final Clinical Impression(s) / ED Diagnoses Final diagnoses:  Congestive heart failure, unspecified HF chronicity, unspecified heart failure type [facility])    Rx / DC Orders ED Discharge Orders    None       Sidney Ace 04/22/20 1646    Daleen Bo, MD 04/23/20 [phone]

## 2020-04-22 NOTE — ED Provider Notes (Addendum)
Received patient at signout from [place].  Refer to provider note for full history and physical examination.  Briefly, patient is a 69 year old female with history of diabetes mellitus, CKD, CVA, CHF presenting for evaluation of shortness of breath, chest pains, orthopnea, PND.  She is afebrile in the ED, hypoxic to 88% on room air.  Work-up suggestive of CHF exacerbation.  However D-dimer is elevated.  In the setting of CKD (renal insufficiency is around baseline today), pending VQ scan for further evaluation.  If evidence of possible PE then she will require admission for further evaluation and management.   Physical Exam  BP (!) 195/71   Pulse (!) 58   Temp 97.8 F (36.6 C) (Oral)   Resp (!) 22   Ht 5\' 1"  (1.549 m)   Wt 99 kg   SpO2 99%   BMI 41.24 kg/m   Physical Exam Vitals and nursing note reviewed.  Constitutional:      General: She is not in acute distress.    Appearance: She is well-developed.  HENT:     Head: Normocephalic and atraumatic.  Eyes:     General:        Right eye: No discharge.        Left eye: No discharge.     Conjunctiva/sclera: Conjunctivae normal.  Neck:     Vascular: No JVD.     Trachea: No tracheal deviation.  Cardiovascular:     Rate and Rhythm: Normal rate.     Comments: Bevelyn Buckles' sign present bilaterally, no palpable cords. Pulmonary:     Effort: Pulmonary effort is normal.     Comments: Bibasilar crackles, left worse than right.  SPO2 saturations 88% with ambulation, improved on supplemental oxygen and with rest. Abdominal:     General: There is no distension.     Palpations: Abdomen is soft.  Musculoskeletal:     Right lower leg: Tenderness present. No edema.     Left lower leg: Tenderness present. No edema.  Skin:    General: Skin is warm and dry.     Findings: No erythema.  Neurological:     Mental Status: She is alert.  Psychiatric:        Behavior: Behavior normal.     ED Course/Procedures    .Critical Care Performed by:  Renita Papa, PA-C Authorized by: Renita Papa, PA-C   Critical care provider statement:    Critical care time (minutes):  45   Critical care was necessary to treat or prevent imminent or life-threatening deterioration of the following conditions:  Respiratory failure   Critical care was time spent personally by me on the following activities:  Discussions with consultants, evaluation of patient's response to treatment, examination of patient, ordering and performing treatments and interventions, ordering and review of laboratory studies, ordering and review of radiographic studies, pulse oximetry, re-evaluation of patient's condition, obtaining history from patient or surrogate and review of old charts    MDM  DG Chest 2 View  Result Date: 04/22/2020 CLINICAL DATA:  Shortness of breath, chest pain. Additional history provided: Patient presents with chest pain and shortness of breath since yesterday, denies fever, history of diabetes and CKD, COVID negative Apr 17, 2020 EXAM: CHEST - 2 VIEW COMPARISON:  Prior chest radiograph 04/17/2020 FINDINGS: Unchanged cardiomegaly. Central pulmonary vascular congestion. Prominence of the interstitial lung markings bilaterally. Small bilateral pleural effusions. No evidence of pneumothorax. No acute bony abnormality is identified. IMPRESSION: Unchanged cardiomegaly with central pulmonary vascular congestion. Bilateral interstitial  prominence likely reflects interstitial edema. Atypical/viral pneumonia cannot be excluded. Small bilateral pleural effusions. Electronically Signed   By: Kellie Simmering DO   On: 04/22/2020 10:19   NM PULMONARY VENT AND PERF (V/Q Scan)  Result Date: 04/22/2020 CLINICAL DATA:  Shortness of breath and chest pain for 1 day EXAM: NUCLEAR MEDICINE VENTILATION - PERFUSION LUNG SCAN TECHNIQUE: Ventilation images were obtained in multiple projections using inhaled aerosol Tc-81m DTPA. Perfusion images were obtained in multiple projections  after intravenous injection of Tc-9m MAA. RADIOPHARMACEUTICALS:  44 mCi of Tc-9m DTPA aerosol inhalation and 1.53 mCi Tc41m MAA IV COMPARISON:  None Correlation: Chest radiograph 04/22/2020 FINDINGS: Ventilation: Decreased ventilation LEFT lower lobe. Subsegmental diminished ventilation posterior RIGHT lower lobe. Remaining ventilation normal. Perfusion: Matching area of diminished perfusion in LEFT lower lobe and smaller matching subsegmental defect in posterior RIGHT lower lobe. Remaining perfusion normal. Chest radiograph: Enlargement of cardiac silhouette with BILATERAL pleural effusions and pulmonary edema, question more prominent in LEFT lower lobe. IMPRESSION: Matching ventilation, perfusion and radiographic abnormalities in the lower lobes bilaterally LEFT greater than RIGHT. Findings represent an intermediate probability for pulmonary embolism. Electronically Signed   By: Lavonia Dana M.D.   On: 04/22/2020 18:06    V/Q scan shows matching ventilation, perfusion, and radiographic abnormalities in the lower lobes bilaterally left greater than right presenting intermediate probability for pulmonary embolism.  Patient was ambulated in the ED with desaturations.  Work-up today is more suggestive of CHF exacerbation though given the patient has limited mobility she is at high risk of developing PE.  We will hold on anticoagulation at this time.  Patient sister who is also her caregiver expresses concern that she is having difficulty caring for the patient at home.  She would like home health resources and further education regarding management of patient's chronic medical problems.  Spoke with Dr. Cathlean Sauer with [facility] hospitalist service who agrees to assume care of patient and bring her into the hospital for further evaluation and management.      Renita Papa, PA-C 04/22/20 2339    Margette Fast, MD 04/22/20 [phone]

## 2020-04-22 NOTE — H&P (Addendum)
History and Physical    Victoria Winters JKD:[id number] DOB: 04/17/1951 DOA: 04/22/2020  PCP: Alfonse Flavors, MD   Patient coming from: Home   Chief Complaint: dyspnea and lower extremity edema.   HPI: Victoria Winters is a 69 y.o. female with medical history significant of diastolic heart failure, type 2 diabetes mellitus, stage V chronic kidney disease, cognitive impairment, cerebellar aneurysm and history of CVA.  Recent hospitalization 5/12-5/13 for decompensated diastolic heart failure, she was discharged in a stable condition, instructions to take furosemide 40 mg daily.  Apparently her sister is in charge of her medications and meals. Yesterday evening she had a acute recurrence of dyspnea, moderate to severe in intensity, persistent in nature, with no improving or worsening factors, associated with PND, orthopnea and lower extremity edema.  Patient unable to recall any recent out of the ordinary event, unable to remember her recent meal.  Per her report her sister has been trying to use low-salt while cooking.   ED Course: Patient was found volume overloaded and hypertensive, she received furosemide 40 mg intravenously and 25 mg of oral hydralazine.   Her d dimer was elevated and she underwent a VQ scan, which resulted with positive matching ventilation, perfusion and radiographic abnormalities in the lower lobes more left than right, findings consistent with intermediate probability for pulmonary embolism.  Review of Systems:  1. General: No fevers, no chills, no weight gain or weight loss 2. ENT: No runny nose or sore throat, no hearing disturbances 3. Pulmonary: positive dyspnea, cough and, wheezing, with no hemoptysis 4. Cardiovascular: No angina, claudication, positive lower extremity edema, pnd and orthopnea 5. Gastrointestinal: No nausea or vomiting, no diarrhea or constipation 6. Hematology: No easy bruisability or frequent infections 7. Urology: No dysuria, hematuria or  increased urinary frequency 8. Dermatology: No rashes. 9. Neurology: No seizures or paresthesias 10. Musculoskeletal: No joint pain or deformities  Past Medical History:  Diagnosis Date  . Brain aneurysm   . Diabetes mellitus without complication ([place])   . Hallucinations    visual and auditory  . Memory loss   . Recurrent UTI   . Renal disorder   . Stage 5 chronic kidney disease ([place])   . Stroke [facility])     Past Surgical History:  Procedure Laterality Date  . CHOLECYSTECTOMY    . SKIN GRAFT       reports that she has quit smoking. She has never used smokeless tobacco. She reports current alcohol use. She reports that she does not use drugs.  Allergies  Allergen Reactions  . Penicillins Anaphylaxis    Did it involve swelling of the face/tongue/throat, SOB, or low BP? Yes Did it involve sudden or severe rash/hives, skin peeling, or any reaction on the inside of your mouth or nose? Unknown Did you need to seek medical attention at a hospital or doctor's office? Unknown When did it last happen?Within past 10 years If all above answers are "NO", may proceed with cephalosporin use.   . Chicken Allergy   . Contrast Media [Iodinated Diagnostic Agents]   . Eggs Or Egg-Derived Products Swelling    Patient is allergic to egg yolk, but can eat egg white with no problems  . Milk-Related Compounds   . Nickel   . Oatmeal   . Other     Black beans, cats, dogs, grass clippings  . Wheat Bran     Family History  Problem Relation Age of Onset  . Heart disease Mother   . Diabetes Mellitus  II Mother   . Heart disease Father   . Diabetes Father   . Heart disease Brother   . Cancer Other      Prior to Admission medications   Medication Sig Start Date End Date Taking? Authorizing Provider  aspirin EC 81 MG tablet Take 81 mg by mouth daily.   Yes [provider]  atorvastatin (LIPITOR) 20 MG tablet Take 20 mg by mouth daily. 09/29/19  Yes [provider]   cetirizine (ZYRTEC) 5 MG tablet Take 5 mg by mouth daily.   Yes [provider]  doxycycline (VIBRA-TABS) 100 MG tablet Take 100 mg by mouth daily. 04/18/20  Yes [provider]  escitalopram (LEXAPRO) 10 MG tablet Take 10 mg by mouth every morning.   Yes [provider]  fludrocortisone (FLORINEF) 0.1 MG tablet Take 2 tablets by mouth daily. 03/19/20  Yes [provider]  furosemide (LASIX) 40 MG tablet Take 40 mg by mouth daily. 10/03/19  Yes [provider]  hydrALAZINE (APRESOLINE) 25 MG tablet Take 25 mg by mouth 3 (three) times daily.   Yes [provider]  insulin glargine (LANTUS) 100 UNIT/ML injection Inject 0.1 mLs (10 Units total) into the skin at bedtime. Patient taking differently: Inject 27 Units into the skin at bedtime.  07/29/19  Yes Kathie Dike, MD  insulin lispro (HUMALOG) 100 UNIT/ML injection Inject 1-17 Units into the skin 3 (three) times daily before meals. Sliding scale starting at; 180-220= 4 units 221-260= 6 units 261-300= 8 units 301-340=10units 341-380=12units   Yes [provider]  isosorbide mononitrate (IMDUR) 30 MG 24 hr tablet Take 30 mg by mouth daily as needed.    Yes [provider]  linagliptin (TRADJENTA) 5 MG TABS tablet Take 5 mg by mouth every morning.   Yes [provider]  loratadine (CLARITIN) 10 MG tablet Take 10 mg by mouth every morning.   Yes [provider]  Melatonin 1 MG TABS Take 5 mg by mouth at bedtime.    Yes [provider]  metoprolol succinate (TOPROL-XL) 100 MG 24 hr tablet Take 100 mg by mouth every morning.    Yes [provider]  midodrine (PROAMATINE) 5 MG tablet Take 10 mg by mouth in the morning, at noon, and at bedtime. 02/23/20  Yes [provider]  Multiple Vitamin (MULTIVITAMIN WITH MINERALS) TABS tablet Take 1 tablet by mouth daily.   Yes [provider]  pregabalin (LYRICA) 75 MG capsule Take 75 mg  by mouth 2 (two) times daily.  10/23/19  Yes [provider]  QUEtiapine (SEROQUEL) 25 MG tablet Take 1 tablet (25 mg total) by mouth at bedtime. 07/29/19  Yes Kathie Dike, MD  traMADol (ULTRAM) 50 MG tablet Take 1 tablet (50 mg total) by mouth every 6 (six) hours as needed. 09/05/19  Yes Lily Kocher, PA-C    Physical Exam: Vitals:   04/22/20 1801 04/22/20 1830 04/22/20 1900 04/22/20 1930  BP:  (!) 155/76 (!) 154/75 (!) 159/80  Pulse: 77 94 89 87  Resp: 18  16 20   Temp:      TempSrc:      SpO2: 94% 98% 94% 92%  Weight:      Height:        Vitals:   04/22/20 1801 04/22/20 1830 04/22/20 1900 04/22/20 1930  BP:  (!) 155/76 (!) 154/75 (!) 159/80  Pulse: 77 94 89 87  Resp: 18  16 20   Temp:      TempSrc:  SpO2: 94% 98% 94% 92%  Weight:      Height:       General: Not in pain, positive dyspnea at rest.  Neurology: Awake and alert, non focal Head and Neck. Head normocephalic. Neck supple with no adenopathy or thyromegaly.   E ENT: mild pallor, no icterus, oral mucosa moist Cardiovascular: No JVD. S1-S2 present, rhythmic, no gallops, rubs, or murmurs. ++ pitting bilateral lower extremity edema. Pulmonary: positive breath sounds bilaterally, no wheezing or rhonchi but bilateral rales, predominantly at bases. Gastrointestinal. Abdomen soft with no organomegaly, non tender, no rebound or guarding Skin. No rashes Musculoskeletal: no joint deformities    Labs on Admission: I have personally reviewed following labs and imaging studies  CBC: Recent Labs  Lab 04/17/20 1039 04/18/20 0545 04/22/20 0945  WBC 13.7* 10.9* 12.6*  NEUTROABS 8.6* 7.1 6.7  HGB 9.3* 9.1* 8.7*  HCT 29.9* 29.1* 27.7*  MCV 88.5 88.2 87.9  PLT 208 167 481   Basic Metabolic Panel: Recent Labs  Lab 04/17/20 1039 04/18/20 0545 04/22/20 0945  NA 141 143 141  K 4.0 3.7 4.1  CL 104 104 105  CO2 27 29 25   GLUCOSE 215* 194* 173*  BUN 52* 45* 54*  CREATININE 2.49* 2.31* 2.40*  CALCIUM  8.2* 8.1* 8.7*   GFR: Estimated Creatinine Clearance: 23.9 mL/min (A) (by C-G formula based on SCr of 2.4 mg/dL (H)). Liver Function Tests: Recent Labs  Lab 04/17/20 1039 04/22/20 0945  AST 16 20  ALT 17 14  ALKPHOS 93 85  BILITOT 1.0 1.0  PROT 6.5 6.2*  ALBUMIN 3.3* 3.1*   Recent Labs  Lab 04/17/20 1039  LIPASE 35   Recent Labs  Lab 04/17/20 1125  AMMONIA 12   Coagulation Profile: No results for input(s): INR, PROTIME in the last 168 hours. Cardiac Enzymes: No results for input(s): CKTOTAL, CKMB, CKMBINDEX, TROPONINI in the last 168 hours. BNP (last 3 results) No results for input(s): PROBNP in the last 8760 hours. HbA1C: No results for input(s): HGBA1C in the last 72 hours. CBG: Recent Labs  Lab 04/17/20 2317 04/18/20 0348 04/18/20 0840 04/18/20 1207  GLUCAP 193* 153* 197* 308*   Lipid Profile: No results for input(s): CHOL, HDL, LDLCALC, TRIG, CHOLHDL, LDLDIRECT in the last 72 hours. Thyroid Function Tests: No results for input(s): TSH, T4TOTAL, FREET4, T3FREE, THYROIDAB in the last 72 hours. Anemia Panel: No results for input(s): VITAMINB12, FOLATE, FERRITIN, TIBC, IRON, RETICCTPCT in the last 72 hours. Urine analysis:    Component Value Date/Time   COLORURINE YELLOW 04/17/2020 [place] 04/17/2020 1519   LABSPEC 1.008 04/17/2020 1519   PHURINE 6.0 04/17/2020 1519   GLUCOSEU 150 (A) 04/17/2020 1519   HGBUR SMALL (A) 04/17/2020 1519   BILIRUBINUR NEGATIVE 04/17/2020 1519   KETONESUR NEGATIVE 04/17/2020 1519   PROTEINUR 100 (A) 04/17/2020 1519   NITRITE NEGATIVE 04/17/2020 1519   LEUKOCYTESUR NEGATIVE 04/17/2020 1519    Radiological Exams on Admission: DG Chest 2 View  Result Date: 04/22/2020 CLINICAL DATA:  Shortness of breath, chest pain. Additional history provided: Patient presents with chest pain and shortness of breath since yesterday, denies fever, history of diabetes and CKD, COVID negative Apr 17, 2020 EXAM: CHEST - 2 VIEW  COMPARISON:  Prior chest radiograph 04/17/2020 FINDINGS: Unchanged cardiomegaly. Central pulmonary vascular congestion. Prominence of the interstitial lung markings bilaterally. Small bilateral pleural effusions. No evidence of pneumothorax. No acute bony abnormality is identified. IMPRESSION: Unchanged cardiomegaly with central pulmonary vascular congestion. Bilateral interstitial  prominence likely reflects interstitial edema. Atypical/viral pneumonia cannot be excluded. Small bilateral pleural effusions. Electronically Signed   By: Kellie Simmering DO   On: 04/22/2020 10:19   NM PULMONARY VENT AND PERF (V/Q Scan)  Result Date: 04/22/2020 CLINICAL DATA:  Shortness of breath and chest pain for 1 day EXAM: NUCLEAR MEDICINE VENTILATION - PERFUSION LUNG SCAN TECHNIQUE: Ventilation images were obtained in multiple projections using inhaled aerosol Tc-92m DTPA. Perfusion images were obtained in multiple projections after intravenous injection of Tc-27m MAA. RADIOPHARMACEUTICALS:  44 mCi of Tc-76m DTPA aerosol inhalation and 1.53 mCi Tc74m MAA IV COMPARISON:  None Correlation: Chest radiograph 04/22/2020 FINDINGS: Ventilation: Decreased ventilation LEFT lower lobe. Subsegmental diminished ventilation posterior RIGHT lower lobe. Remaining ventilation normal. Perfusion: Matching area of diminished perfusion in LEFT lower lobe and smaller matching subsegmental defect in posterior RIGHT lower lobe. Remaining perfusion normal. Chest radiograph: Enlargement of cardiac silhouette with BILATERAL pleural effusions and pulmonary edema, question more prominent in LEFT lower lobe. IMPRESSION: Matching ventilation, perfusion and radiographic abnormalities in the lower lobes bilaterally LEFT greater than RIGHT. Findings represent an intermediate probability for pulmonary embolism. Electronically Signed   By: Lavonia Dana M.D.   On: 04/22/2020 18:06    EKG: Independently reviewed.  62 bpm, normal axis, prolonged QTc 549, sinus rhythm,  poor R wave progression, no ST segment or T wave changes.  Assessment/Plan Principal Problem:   Heart failure (HCC) Active Problems:   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   Brain aneurysm   CVA (cerebral vascular accident) ([place])   DNR (do not resuscitate)   Anemia in chronic kidney disease (CKD)   69 year old female with significant past medical history for diastolic heart failure and chronic kidney disease stage V who presents with acute onset of dyspnea, wheezing, cough, PND and lower extremity edema.  She has cognitive impairment, unclear her compliance with medications and salt restricted diet.  On her initial physical examination her temperature was 97.8, blood pressure 203/83, respiratory 21, oxygen saturation 91% on room air.  Her lungs have bilateral rales, predominantly at bases, heart S1-S2, present rhythmic, soft abdomen, ++ pitting bilateral lower extremity edema. Sodium 141, potassium 4.1, chloride 105, bicarb 25, glucose 173, BUN 54, creatinine 2.4, BNP 769, troponin I 12, white count 12.6, hemoglobin 8.7, hematocrit 27.7, platelets 209, D-dimer 2.5, SARS COVID-19 negative, chest x-ray with cardiomegaly, bilateral interstitial infiltrates, bilateral hilar vascular congestion, bilateral small pleural effusions.  Patient will be admitted to the hospital working diagnosis of acute on chronic decompensated diastolic heart failure.   1.  Acute on chronic diastolic heart failure exacerbation.  Patient will be admitted to the telemetry ward, continue aggressive diuresis with furosemide, 60 mg intravenously every 12 hours, to target a negative fluid balance.  Continue afterload reduction with hydralazine and isosorbide.  Continue metoprolol succinate 100 mg daily.  Will hold on midodrine for now.  In terms of prolonged Qtc, will check Mg and will keep K at 4, will follow ekg in am, avoid qtc prolonging medications.   2.  Chronic kidney disease stage 4-5.  Old records, personally reviewed,  her baseline creatinine seems to be 2.3-2.4.  We will continue diuresis with intravenous furosemide, follow-up kidney function in the morning, avoid hypotension nephrotoxic agents.  3.  Type 2 diabetes mellitus/ dyslipidemia.  Her admission glucose is 173, continue glucose coverage and monitoring with insulin sliding scale. Hold for now long acting insulin and trajenda.   Continue with atorvastatin.   4.  Elevated D-dimer.  Her  VQ scan is intermediate probability, clinically she does have signs of heart failure, in the setting of elevated creatinine an elevated D-dimer becomes less sensitive. For now we will hold anticoagulation, will do ultrasonography of the lower extremities.  5. Adrenal insufficiency. Continue with fludrocortisone, hold on midodrine for now.   6. Depression. Continue with escitalopram, quetiapine and pregabalin.  7. Anemia of chronic renal disease. Hgb and Hct stable, continue close monitoring.    Status is: Inpatient  Remains inpatient appropriate because:IV treatments appropriate due to intensity of illness or inability to take PO   Dispo: The patient is from: Home              Anticipated d/c is to: Home              Anticipated d/c date is: 3 days              Patient currently is not medically stable to d/c.    DVT prophylaxis: Enoxaparin   Code Status:   dnr   Family Communication:  No family at the bedside     Consults called:  None   Admission status:  Inpatient,    Demetruis Depaul Gerome Apley MD [facility]   04/22/2020, 9:42 PM

## 2020-04-23 ENCOUNTER — Inpatient Hospital Stay (HOSPITAL_COMMUNITY): Payer: 59

## 2020-04-23 DIAGNOSIS — J9601 Acute respiratory failure with hypoxia: Secondary | ICD-10-CM | POA: Diagnosis present

## 2020-04-23 DIAGNOSIS — I5033 Acute on chronic diastolic (congestive) heart failure: Secondary | ICD-10-CM

## 2020-04-23 LAB — BASIC METABOLIC PANEL
Anion gap: 8 (ref 5–15)
BUN: 54 mg/dL — ABNORMAL HIGH (ref 8–23)
CO2: 31 mmol/L (ref 22–32)
Calcium: 8.2 mg/dL — ABNORMAL LOW (ref 8.9–10.3)
Chloride: 106 mmol/L (ref 98–111)
Creatinine, Ser: 2.4 mg/dL — ABNORMAL HIGH (ref 0.44–1.00)
GFR calc Af Amer: 23 mL/min — ABNORMAL LOW (ref >60)
GFR calc non Af Amer: 20 mL/min — ABNORMAL LOW (ref >60)
Glucose, Bld: 218 mg/dL — ABNORMAL HIGH (ref 70–99)
Potassium: 4.1 mmol/L (ref 3.5–5.1)
Sodium: 145 mmol/L (ref 135–145)

## 2020-04-23 LAB — GLUCOSE, CAPILLARY
Glucose-Capillary: 189 mg/dL — ABNORMAL HIGH (ref 70–99)
Glucose-Capillary: 228 mg/dL — ABNORMAL HIGH (ref 70–99)
Glucose-Capillary: 232 mg/dL — ABNORMAL HIGH (ref 70–99)
Glucose-Capillary: 222 mg/dL — ABNORMAL HIGH (ref 70–99)

## 2020-04-23 NOTE — Progress Notes (Signed)
Inpatient Diabetes Program Recommendations  AACE/ADA: New Consensus Statement on Inpatient Glycemic Control   Target Ranges:  Prepandial:   less than 140 mg/dL      Peak postprandial:   less than 180 mg/dL (1-2 hours)      Critically ill patients:  140 - 180 mg/dL   Results for SHERAL, PFAHLER (MRN [id number]) as of 04/23/2020 10:34  Ref. Range 04/22/2020 22:42 04/23/2020 07:46  Glucose-Capillary Latest Ref Range: 70 - 99 mg/dL 192 (H) 189 (H)   Review of Glycemic Control  Diabetes history: DM2 Outpatient Diabetes medications: Lantus 27 units QHS, Humalog 1-17 units TID with meals, Tradjenta 5 mg daily Current orders for Inpatient glycemic control: Novolog 0-6 units TID with meals  Inpatient Diabetes Program Recommendations:   Insulin - Basal: If glucose is consistently greater than 180 mg/dl with Novolog correction insulin, please consider ordering Lantus 10 units Q24H.  HgbA1C: A1C 8.4% on 04/17/20 indicating an average glucose of 194 mg/dl over the past 2-3 months.  Thanks, Barnie Alderman, RN, MSN, CDE Diabetes Coordinator Inpatient Diabetes Program [phone] (Team Pager from 8am to 5pm)

## 2020-04-23 NOTE — Progress Notes (Signed)
Patient Demographics:    Victoria Winters, is a 69 y.o. female, DOB - 06/01/1951, HKV:[id number]  Admit date - 04/22/2020   Admitting Physician Mauricio Gerome Apley, MD  Outpatient Primary MD for the patient is Zhou-Talbert, Elwyn Lade, MD  LOS - 1   Chief Complaint  Patient presents with   Shortness of Breath        Subjective:    Victoria Winters today has no fevers, no emesis,  No chest pain,    C/o shob, voiding well -Hypoxia persist,   Assessment  & Plan :    Principal Problem:   Acute on chronic heart failure with preserved ejection fraction (HFpEF) --grade 2 diastolic dysfunction Active Problems:   Acute respiratory failure with hypoxia (HCC)   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 female with medical history significant of diastolic heart failure, type 2 diabetes mellitus, stage V chronic kidney disease, cognitive impairment, cerebellar aneurysm and history of CVA admitted on 04/22/2020 with acute on chronic diastolic dysfunction exacerbation   A/p 1)HFpEF-acute on chronic diastolic CHF exacerbation in a patient with grade 2 diastolic dysfunction and EF of 65% --Continue aggressive IV diuresis with Lasix, daily weights fluid input and output monitoring  2)Acute Hypoxic Respiratory Failure--most likely secondary to #1 above, on admission D-dimer was elevated, VQ scan was indeterminate/intermediate probability--- clinically low index of suspicion for PE -Patient baseline myoglobin around 9 hemoglobin currently 8.7 patient at high risk for bleeding -Lower extremity venous Dopplers negative for DVT -Unable to do CTA chest to rule out PE due to CKD 4 with GFR around 20 -Holding off on anticoagulation for  now -Okay to continue DVT prophylaxis  3)CKD IV--creatinine currently 2.40 which is around patient's recent baseline -Monitor renal function closely with IV diuresis as above #1  4)Chronic Anemia--- suspect due to underlying CKD, globin usually around 9 hemoglobin currently 8.7 continue to monitor closely -- 5)DM2--A1c is 8.4, reflecting uncontrolled DM, use Lantus insulin and sliding scale coverage with NovoLog  -PTA patient was also on Tradjenta  6)Adrenal  Insufficiency--- continue fludrocortisone, midodrine on hold for now  7) depressive disorder--stable, continue Seroquel and Lexapro  8) cognitive impairment--- history of prior aneurysm, if history of prior stroke, patient sister helps her make decisions  Disposition/Need for in-Hospital Stay- patient unable to be discharged at this time due to--- diastolic congestive failure with hypoxia requiring IV Lasix/ diuresis* -Patient From: home D/C Place: home  Barriers: Not Clinically Stable-  Code Status : DNR  Family Communication:  (patient is alert, awake and coherent)  Consults  :  na  DVT Prophylaxis  :    Heparin - SCDs    Lab Results  Component Value Date   PLT 209 04/22/2020    Inpatient Medications  Scheduled Meds:  aspirin EC  81 mg Oral Daily   atorvastatin  20 mg Oral Daily   escitalopram  10 mg Oral q morning - 10a   fludrocortisone  200 mcg Oral Daily   furosemide  60 mg Intravenous BID   heparin  5,000  Units Subcutaneous Q8H   hydrALAZINE  25 mg Oral TID   insulin aspart  0-6 Units Subcutaneous TID WC   isosorbide mononitrate  30 mg Oral Daily   loratadine  10 mg Oral q morning - 10a   metoprolol succinate  100 mg Oral q morning - 10a   multivitamin with minerals  1 tablet Oral Daily   pregabalin  75 mg Oral BID   QUEtiapine  25 mg Oral QHS   sodium chloride flush  3 mL Intravenous Q12H   Continuous Infusions:  sodium chloride     PRN Meds:.sodium chloride, acetaminophen, sodium  chloride flush   Anti-infectives (From admission, onward)   None        Objective:   Vitals:   04/23/20 0241 04/23/20 0500 04/23/20 0619 04/23/20 1403  BP: (!) 144/48  (!) 160/52 (!) 159/63  Pulse: 66  67 66  Resp:    20  Temp:   97.9 F (36.6 C) 98.5 F (36.9 C)  TempSrc:   Oral Oral  SpO2: 91%  99% 96%  Weight:  100.6 kg    Height:        Wt Readings from Last 3 Encounters:  04/23/20 100.6 kg  04/18/20 99.8 kg  10/23/19 95.3 kg    Intake/Output Summary (Last 24 hours) at 04/23/2020 1717 Last data filed at 04/23/2020 1156 Gross per 24 hour  Intake 240 ml  Output 1850 ml  Net -1610 ml     Physical Exam  Gen:- Awake Alert,  In no apparent distress  HEENT:- [place].AT, No sclera icterus Nose- [place] 2 L/min Neck-Supple Neck +ve JVD,.  Lungs-diminished breath sounds, no wheezing, faint bibasilar rales CV- S1, S2 normal, regular  Abd-  +ve B.Sounds, Abd Soft, No tenderness,    Extremity/Skin:-1 +  edema, pedal pulses present  Psych-affect is appropriate, oriented x3 Neuro-generalized weakness, no new focal deficits, no tremors   Data Review:   Micro Results Recent Results (from the past 240 hour(s))  SARS Coronavirus 2 by RT PCR (hospital order, performed in [facility] hospital lab) Nasopharyngeal Nasopharyngeal Swab     Status: None   Collection Time: 04/17/20  1:26 PM   Specimen: Nasopharyngeal Swab  Result Value Ref Range Status   SARS Coronavirus 2 NEGATIVE NEGATIVE Final    Comment: (NOTE) SARS-CoV-2 target nucleic acids are NOT DETECTED. The SARS-CoV-2 RNA is generally detectable in upper and lower respiratory specimens during the acute phase of infection. The lowest concentration of SARS-CoV-2 viral copies this assay can detect is 250 copies / mL. A negative result does not preclude SARS-CoV-2 infection and should not be used as the sole basis for treatment or other patient management decisions.  A negative result may occur with improper specimen  collection / handling, submission of specimen other than nasopharyngeal swab, presence of viral mutation(s) within the areas targeted by this assay, and inadequate number of viral copies (<250 copies / mL). A negative result must be combined with clinical observations, patient history, and epidemiological information. Fact Sheet for Patients:   StrictlyIdeas.no Fact Sheet for Healthcare Providers: BankingDealers.co.za This test is not yet approved or cleared  by the [place] FDA and has been authorized for detection and/or diagnosis of SARS-CoV-2 by FDA under an Emergency Use Authorization (EUA).  This EUA will remain in effect (meaning this test can be used) for the duration of the COVID-19 declaration under Section 564(b)(1) of the Act, 21 U.S.C. section 360bbb-3(b)(1), unless the authorization is terminated or revoked sooner.  Performed at [facility], [address] [place], [place] [address]   Urine culture     Status: None   Collection Time: 04/17/20  3:19 PM   Specimen: Urine, Clean Catch  Result Value Ref Range Status   Specimen Description   Final    URINE, CLEAN CATCH Performed at [facility], [address] [place], [place] [address]    Special Requests   Final    NONE Performed at [facility], [address], [place], [place] [address]    Culture   Final    NO GROWTH Performed at [facility], [place] [address] [place], [place] [address]    Report Status 04/18/2020 FINAL  Final  SARS Coronavirus 2 by RT PCR (hospital order, performed in [facility] hospital lab) Nasopharyngeal Nasopharyngeal Swab     Status: None   Collection Time: 04/22/20 10:49 AM   Specimen: Nasopharyngeal Swab  Result Value Ref Range Status   SARS Coronavirus 2 NEGATIVE NEGATIVE Final    Comment: (NOTE) SARS-CoV-2 target nucleic acids are NOT DETECTED. The SARS-CoV-2 RNA is generally detectable in upper and lower respiratory specimens  during the acute phase of infection. The lowest concentration of SARS-CoV-2 viral copies this assay can detect is 250 copies / mL. A negative result does not preclude SARS-CoV-2 infection and should not be used as the sole basis for treatment or other patient management decisions.  A negative result may occur with improper specimen collection / handling, submission of specimen other than nasopharyngeal swab, presence of viral mutation(s) within the areas targeted by this assay, and inadequate number of viral copies (<250 copies / mL). A negative result must be combined with clinical observations, patient history, and epidemiological information. Fact Sheet for Patients:   StrictlyIdeas.no Fact Sheet for Healthcare Providers: BankingDealers.co.za This test is not yet approved or cleared  by the [place] FDA and has been authorized for detection and/or diagnosis of SARS-CoV-2 by FDA under an Emergency Use Authorization (EUA).  This EUA will remain in effect (meaning this test can be used) for the duration of the COVID-19 declaration under Section 564(b)(1) of the Act, 21 U.S.C. section 360bbb-3(b)(1), unless the authorization is terminated or revoked sooner. Performed at [facility], [address], [place], [place] [address]     Radiology Reports DG Chest 2 View  Result Date: 04/22/2020 CLINICAL DATA:  Shortness of breath, chest pain. Additional history provided: Patient presents with chest pain and shortness of breath since yesterday, denies fever, history of diabetes and CKD, COVID negative Apr 17, 2020 EXAM: CHEST - 2 VIEW COMPARISON:  Prior chest radiograph 04/17/2020 FINDINGS: Unchanged cardiomegaly. Central pulmonary vascular congestion. Prominence of the interstitial lung markings bilaterally. Small bilateral pleural effusions. No evidence of pneumothorax. No acute bony abnormality is identified. IMPRESSION: Unchanged cardiomegaly  with central pulmonary vascular congestion. Bilateral interstitial prominence likely reflects interstitial edema. Atypical/viral pneumonia cannot be excluded. Small bilateral pleural effusions. Electronically Signed   By: Kellie Simmering DO   On: 04/22/2020 10:19   CT Head Wo Contrast  Result Date: 04/17/2020 CLINICAL DATA:  Ataxia, stroke suspected. Additional history provided: Increased confusion, weakness since 11 p.m. last night, history of stroke, short-term memory loss at baseline EXAM: CT HEAD WITHOUT CONTRAST TECHNIQUE: Contiguous axial images were obtained from the base of the skull through the vertex without intravenous contrast. COMPARISON:  Brain MRI 07/21/2019, head CT 07/20/2019. FINDINGS: Brain: Redemonstrated chronic lacunar infarcts within the cerebral white matter and basal ganglia bilaterally. A known  chronic infarct within the paramedian right pons was better appreciated on prior MRI 07/21/2019. Stable, mild generalized parenchymal atrophy. There is no acute intracranial hemorrhage. No demarcated cortical infarct. No extra-axial fluid collection. No evidence of intracranial mass. No midline shift. Partially empty sella turcica. Vascular: No hyperdense vessel.  Atherosclerotic calcifications. Skull: Normal. Negative for fracture or focal lesion. Sinuses/Orbits: Visualized orbits show no acute finding. Minimal scattered paranasal sinus mucosal thickening. No significant mastoid effusion at the imaged levels. IMPRESSION: 1. No CT evidence of acute intracranial abnormality. 2. Redemonstrated chronic lacunar infarcts within the bilateral cerebral white matter and basal ganglia. 3. A known chronic infarct within the paramedian right pons was better appreciated on prior MRI 07/21/2019. 4. Minimal scattered paranasal sinus mucosal thickening. Electronically Signed   By: Kellie Simmering DO   On: 04/17/2020 11:23   MR BRAIN WO CONTRAST  Result Date: 04/17/2020 CLINICAL DATA:  Encephalopathy EXAM: MRI  HEAD WITHOUT CONTRAST TECHNIQUE: Multiplanar, multiecho pulse sequences of the brain and surrounding structures were obtained without intravenous contrast. COMPARISON:  07/21/2019 FINDINGS: Brain: There is no acute infarction or intracranial hemorrhage. There is no intracranial mass, mass effect, or edema. There is no hydrocephalus or extra-axial fluid collection. There are multiple chronic small vessel infarcts including involvement of bilateral basal ganglia and adjacent white matter as well as the right pons. There is hemosiderin deposition associated with the left basal ganglia infarct. Additional patchy T2 hyperintensity in the supratentorial white matter is nonspecific but may reflect mild chronic microvascular ischemic changes. Ventricles and sulci are within normal limits in size and configuration. Vascular: Major vessel flow voids at the skull base are preserved. Skull and upper cervical spine: Normal marrow signal is preserved. Sinuses/Orbits: Trace mucosal thickening. Bilateral lens replacements. Other: Incidental note is made of a partially empty sella. Trace mastoid fluid opacification. IMPRESSION: No evidence of recent infarction, hemorrhage, or mass. Stable chronic findings detailed above. Electronically Signed   By: Macy Mis M.D.   On: 04/17/2020 14:14   NM PULMONARY VENT AND PERF (V/Q Scan)  Result Date: 04/22/2020 CLINICAL DATA:  Shortness of breath and chest pain for 1 day EXAM: NUCLEAR MEDICINE VENTILATION - PERFUSION LUNG SCAN TECHNIQUE: Ventilation images were obtained in multiple projections using inhaled aerosol Tc-19m DTPA. Perfusion images were obtained in multiple projections after intravenous injection of Tc-71m MAA. RADIOPHARMACEUTICALS:  44 mCi of Tc-55m DTPA aerosol inhalation and 1.53 mCi Tc67m MAA IV COMPARISON:  None Correlation: Chest radiograph 04/22/2020 FINDINGS: Ventilation: Decreased ventilation LEFT lower lobe. Subsegmental diminished ventilation posterior RIGHT  lower lobe. Remaining ventilation normal. Perfusion: Matching area of diminished perfusion in LEFT lower lobe and smaller matching subsegmental defect in posterior RIGHT lower lobe. Remaining perfusion normal. Chest radiograph: Enlargement of cardiac silhouette with BILATERAL pleural effusions and pulmonary edema, question more prominent in LEFT lower lobe. IMPRESSION: Matching ventilation, perfusion and radiographic abnormalities in the lower lobes bilaterally LEFT greater than RIGHT. Findings represent an intermediate probability for pulmonary embolism. Electronically Signed   By: Lavonia Dana M.D.   On: 04/22/2020 18:06   US Venous Img Lower Bilateral (DVT)  Result Date: 04/23/2020 CLINICAL DATA:  Lower extremity edema. History of previous DVT. Evaluate for acute or chronic DVT. EXAM: BILATERAL LOWER EXTREMITY VENOUS DOPPLER ULTRASOUND TECHNIQUE: Gray-scale sonography with graded compression, as well as color Doppler and duplex ultrasound were performed to evaluate the lower extremity deep venous systems from the level of the common femoral vein and including the common femoral, femoral, profunda femoral, popliteal and calf  veins including the posterior tibial, peroneal and gastrocnemius veins when visible. The superficial great saphenous vein was also interrogated. Spectral Doppler was utilized to evaluate flow at rest and with distal augmentation maneuvers in the common femoral, femoral and popliteal veins. COMPARISON:  None. FINDINGS: RIGHT LOWER EXTREMITY Common Femoral Vein: No evidence of thrombus. Normal compressibility, respiratory phasicity and response to augmentation. Saphenofemoral Junction: No evidence of thrombus. Normal compressibility and flow on color Doppler imaging. Profunda Femoral Vein: No evidence of thrombus. Normal compressibility and flow on color Doppler imaging. Femoral Vein: No evidence of thrombus. Normal compressibility, respiratory phasicity and response to augmentation.  Popliteal Vein: No evidence of thrombus. Normal compressibility, respiratory phasicity and response to augmentation. Calf Veins: No evidence of thrombus. Normal compressibility and flow on color Doppler imaging. Superficial Great Saphenous Vein: No evidence of thrombus. Normal compressibility. Venous Reflux:  None. Other Findings:  None. LEFT LOWER EXTREMITY Common Femoral Vein: No evidence of thrombus. Normal compressibility, respiratory phasicity and response to augmentation. Saphenofemoral Junction: No evidence of thrombus. Normal compressibility and flow on color Doppler imaging. Profunda Femoral Vein: No evidence of thrombus. Normal compressibility and flow on color Doppler imaging. Femoral Vein: No evidence of thrombus. Normal compressibility, respiratory phasicity and response to augmentation. Popliteal Vein: No evidence of thrombus. Normal compressibility, respiratory phasicity and response to augmentation. Calf Veins: No evidence of thrombus. Normal compressibility and flow on color Doppler imaging. Superficial Great Saphenous Vein: No evidence of thrombus. Normal compressibility. Venous Reflux:  None. Other Findings:  None. IMPRESSION: No evidence of acute or chronic DVT within either lower extremity. Electronically Signed   By: Sandi Mariscal M.D.   On: 04/23/2020 12:32   DG Chest Portable 1 View  Result Date: 04/17/2020 CLINICAL DATA:  Shortness of breath. Additional provided: Wheezing, increased confusion, weakness since 11 p.m. last night, pending COVID test. EXAM: PORTABLE CHEST 1 VIEW COMPARISON:  Chest radiograph 09/05/2019 FINDINGS: Unchanged cardiomegaly. Central pulmonary vascular congestion. Ill-defined opacities at the bilateral lung bases. No definite pleural effusion or evidence of pneumothorax. 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 IMPRESSION: Unchanged cardiomegaly.  Central pulmonary vascular congestion. Ill-defined opacities at  the bilateral lung bases may reflect edema, atelectasis or pneumonia. Electronically Signed   By: Kellie Simmering DO   On: 04/17/2020 11:12   ECHOCARDIOGRAM COMPLETE  Result Date: 04/18/2020    ECHOCARDIOGRAM REPORT   Patient Name:   MONTASIA CHISENHALL Date of Exam: 04/18/2020 Medical Rec #:  [id number]  Height:       61.0 in Accession #:    [id number] Weight:       220.0 lb Date of Birth:  1951-11-28 BSA:          1.967 m Patient Age:    41 years   BP:           159/64 mmHg Patient Gender: F          HR:           72 bpm. Exam Location:  [facility] Procedure: 2D Echo, Cardiac Doppler and Color Doppler Indications:    I50.23 Acute on chronic systolic (congestive) heart failure  History:        Patient has no prior history of Echocardiogram examinations.                 Stroke; Risk Factors:Diabetes. CKD.  Sonographer:    Jonelle Sidle Dance Referring Phys: [place]  1. Left ventricular ejection fraction, by estimation,  is 65 to 70%. The left ventricle has normal function. The left ventricle has no regional wall motion abnormalities. There is mild concentric left ventricular hypertrophy. Left ventricular diastolic parameters are consistent with Grade II diastolic dysfunction (pseudonormalization). Elevated left ventricular end-diastolic pressure.  2. Right ventricular systolic function is normal. The right ventricular size is normal. Tricuspid regurgitation signal is inadequate for assessing PA pressure.  3. Left atrial size was moderately dilated.  4. The mitral valve is grossly normal. Mild mitral valve regurgitation.  5. The aortic valve has an indeterminant number of cusps. Aortic valve regurgitation is not visualized.  6. The inferior vena cava is dilated in size with >50% respiratory variability, suggesting right atrial pressure of 8 mmHg.  7. A small pericardial effusion is present. The pericardial effusion is circumferential. There is no evidence of cardiac tamponade. FINDINGS  Left Ventricle:  Left ventricular ejection fraction, by estimation, is 65 to 70%. The left ventricle has normal function. The left ventricle has no regional wall motion abnormalities. The left ventricular internal cavity size was normal in size. There is  mild concentric left ventricular hypertrophy. Left ventricular diastolic parameters are consistent with Grade II diastolic dysfunction (pseudonormalization). Elevated left ventricular end-diastolic pressure. Right Ventricle: The right ventricular size is normal. No increase in right ventricular wall thickness. Right ventricular systolic function is normal. Tricuspid regurgitation signal is inadequate for assessing PA pressure. Left Atrium: Left atrial size was moderately dilated. Right Atrium: Right atrial size was normal in size. Pericardium: A small pericardial effusion is present. The pericardial effusion is circumferential. There is no evidence of cardiac tamponade. Mitral Valve: The mitral valve is grossly normal. Mild mitral annular calcification. Mild mitral valve regurgitation. Tricuspid Valve: The tricuspid valve is grossly normal. Tricuspid valve regurgitation is mild. Aortic Valve: The aortic valve has an indeterminant number of cusps. Aortic valve regurgitation is not visualized. Mild aortic valve annular calcification. There is mild calcification of the aortic valve. Pulmonic Valve: The pulmonic valve was not well visualized. Pulmonic valve regurgitation is not visualized. Aorta: The aortic root is normal in size and structure. Venous: The inferior vena cava is dilated in size with greater than 50% respiratory variability, suggesting right atrial pressure of 8 mmHg. IAS/Shunts: No atrial level shunt detected by color flow Doppler.  LEFT VENTRICLE PLAX 2D LVIDd:         4.58 cm  Diastology LVIDs:         3.69 cm  LV e' lateral:   4.57 cm/s LV PW:         1.30 cm  LV E/e' lateral: 35.2 LV IVS:        1.13 cm  LV e' medial:    3.92 cm/s LVOT diam:     2.00 cm  LV E/e'  medial:  41.1 LV SV:         90 LV SV Index:   46 LVOT Area:     3.14 cm  RIGHT VENTRICLE RV Basal diam:  2.79 cm RV S prime:     13.50 cm/s TAPSE (M-mode): 2.4 cm LEFT ATRIUM              Index       RIGHT ATRIUM           Index LA diam:        4.60 cm  2.34 cm/m  RA Area:     16.00 cm LA Vol (A2C):   115.0 ml 58.47 ml/m RA Volume:   38.30 ml  19.47 ml/m LA Vol (A4C):   75.0 ml  38.13 ml/m LA Biplane Vol: 95.2 ml  48.40 ml/m  AORTIC VALVE LVOT Vmax:   100.00 cm/s LVOT Vmean:  69.900 cm/s LVOT VTI:    0.285 m  AORTA Ao Root diam: 3.20 cm Ao Asc diam:  3.60 cm MITRAL VALVE MV Area (PHT): 3.54 cm     SHUNTS MV Decel Time: 214 msec     Systemic VTI:  0.29 m MV E velocity: 161.00 cm/s  Systemic Diam: 2.00 cm MV A velocity: 149.00 cm/s MV E/A ratio:  1.08 Kate Sable MD Electronically signed by Kate Sable MD Signature Date/Time: 04/18/2020/10:06:32 AM    Final      CBC Recent Labs  Lab 04/17/20 1039 04/18/20 0545 04/22/20 0945  WBC 13.7* 10.9* 12.6*  HGB 9.3* 9.1* 8.7*  HCT 29.9* 29.1* 27.7*  PLT 208 167 209  MCV 88.5 88.2 87.9  MCH 27.5 27.6 27.6  MCHC 31.1 31.3 31.4  RDW 14.5 14.2 14.4  LYMPHSABS 3.6 2.7 3.8  MONOABS 1.1* 0.8 0.8  EOSABS 0.3 0.2 1.2*  BASOSABS 0.1 0.0 0.1    Chemistries  Recent Labs  Lab 04/17/20 1039 04/18/20 0545 04/22/20 0945 04/23/20 0524  NA 141 143 141 145  K 4.0 3.7 4.1 4.1  CL 104 104 105 106  CO2 27 29 25 31   GLUCOSE 215* 194* 173* 218*  BUN 52* 45* 54* 54*  CREATININE 2.49* 2.31* 2.40* 2.40*  CALCIUM 8.2* 8.1* 8.7* 8.2*  AST 16  --  20  --   ALT 17  --  14  --   ALKPHOS 93  --  85  --   BILITOT 1.0  --  1.0  --    ------------------------------------------------------------------------------------------------------------------ No results for input(s): CHOL, HDL, LDLCALC, TRIG, CHOLHDL, LDLDIRECT in the last 72 hours.  Lab Results  Component Value Date   HGBA1C 8.4 (H) 04/17/2020    ------------------------------------------------------------------------------------------------------------------ No results for input(s): TSH, T4TOTAL, T3FREE, THYROIDAB in the last 72 hours.  Invalid input(s): FREET3 ------------------------------------------------------------------------------------------------------------------ No results for input(s): VITAMINB12, FOLATE, FERRITIN, TIBC, IRON, RETICCTPCT in the last 72 hours.  Coagulation profile No results for input(s): INR, PROTIME in the last 168 hours.  Recent Labs    04/22/20 0945  DDIMER 2.50*    Cardiac Enzymes No results for input(s): CKMB, TROPONINI, MYOGLOBIN in the last 168 hours.  Invalid input(s): CK ------------------------------------------------------------------------------------------------------------------    Component Value Date/Time   BNP 769.0 (H) 04/22/2020 0945     Roxan Hockey M.D on 04/23/2020 at 5:17 PM  Go to [email or web address] - for contact info  [facility] - Office  [phone]

## 2020-04-23 NOTE — TOC Initial Note (Signed)
Transition of Care [facility]) - Initial/Assessment Note    Patient Details  Name: Victoria Winters MRN: [id number] Date of Birth: 1951-09-07  Transition of Care [facility]) CM/SW Contact:    Salome Arnt, LCSW Phone Number: 04/23/2020, 8:59 AM  Clinical Narrative:  LCSW referred for heart failure screening. Pt admitted due to acute on chronic heart failure. Pt states she lives at home with her sister, [name]. LCSW spoke with pt and sister to complete assessment as pt was not sure of some information. Pt moved here from her daughter's home in [place] in the fall of 2019. She reports she does not take daily weights and [name] indicates that she has a hard time balancing on scale. [name] provides all of pt's meals and does her best to encourage and serve heart healthy foods. However, pt has a difficult time with diet and [name] states that food is her "one joy in life." Pt is not followed by cardiologist. Her sister assists with medications which pt takes as prescribed. [name] reports pt has had short-term memory issues since her stroke and [name] assists with most decisions and is her HCPOA. Per sister, pt has had home health PT/RN in the past through [facility], but no current services. She has an aid through [employer] who helps her with bathing 3 times a week. [name] is considering an adult day care program in [place] for pt, but is also considering palliative care. LCSW will discuss with MD. Pt's PCP is [facility]. [name] states pt already has an appointment with PCP on Friday, May 21st. LCSW will continue to follow and assist with any discharge planning needs.                   Expected Discharge Plan: Home/Self Care Barriers to Discharge: Continued Medical Work up   Patient Goals and CMS Choice Patient states their goals for this hospitalization and ongoing recovery are:: Return home      Expected Discharge Plan and Services Expected Discharge Plan: Home/Self  Care In-house Referral: Clinical Social Work     Living arrangements for the past 2 months: Single Family Home                                      Prior Living Arrangements/Services Living arrangements for the past 2 months: Single Family Home Lives with:: Siblings Patient language and need for interpreter reviewed:: Yes Do you feel safe going back to the place where you live?: Yes      Need for Family Participation in Patient Care: Yes (Comment) Care giver support system in place?: Yes (comment) Current home services: DME(walker, shower chair, wheelchair, electric scooter) Criminal Activity/Legal Involvement Pertinent to Current Situation/Hospitalization: No - Comment as needed  Activities of Daily Living Home Assistive Devices/Equipment: Walker (specify type), CBG Meter, Eyeglasses ADL Screening (condition at time of admission) Patient's cognitive ability adequate to safely complete daily activities?: No Is the patient deaf or have difficulty hearing?: No Does the patient have difficulty seeing, even when wearing glasses/contacts?: No Does the patient have difficulty concentrating, remembering, or making decisions?: Yes Patient able to express need for assistance with ADLs?: Yes Does the patient have difficulty dressing or bathing?: No Independently performs ADLs?: No Communication: Independent Dressing (OT): Needs assistance Is this a change from baseline?: Pre-admission baseline Grooming: Needs assistance Is this a change from baseline?: Pre-admission baseline Feeding: Independent Bathing: Needs assistance Is this a  change from baseline?: Pre-admission baseline Toileting: Needs assistance Is this a change from baseline?: Pre-admission baseline In/Out Bed: Needs assistance Is this a change from baseline?: Pre-admission baseline Walks in Home: Needs assistance Is this a change from baseline?: Pre-admission baseline Does the patient have difficulty walking or  climbing stairs?: Yes Weakness of Legs: Both Weakness of Arms/Hands: Both  Permission Sought/Granted Permission sought to share information with : Family Supports Permission granted to share information with : Yes, Verbal Permission Granted  Share Information with NAME: [name]     Permission granted to share info w Relationship: sister     Emotional Assessment Appearance:: Appears stated age Attitude/Demeanor/Rapport: Engaged Affect (typically observed): Appropriate Orientation: : Oriented to Self, Oriented to Place, Oriented to  Time, Oriented to Situation Alcohol / Substance Use: Alcohol Use(occasional wine) Psych Involvement: No (comment)  Admission diagnosis:  Heart failure (HCC) [I50.9] Congestive heart failure, unspecified HF chronicity, unspecified heart failure type ([place]) [I50.9] Patient Active Problem List   Diagnosis Date Noted  . Heart failure ([place]) 04/22/2020  . Acute and chronic respiratory failure with hypoxia ([place]) 04/17/2020  . DNR (do not resuscitate) 04/17/2020  . Leukocytosis 04/17/2020  . Anemia in chronic kidney disease (CKD) 04/17/2020  . Closed fracture of distal end of right fibula with routine healing 11/23/2019  . Palliative care by specialist   . Goals of care, counseling/discussion   . History of CVA (cerebrovascular accident)   . Acute encephalopathy 07/21/2019  . Fall 07/20/2019  . 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 Brain aneurysm 07/20/2019  . DM (diabetes mellitus) ([place]) 07/20/2019  . Bilateral fibular fractures 07/20/2019  . CVA (cerebral vascular accident) ([place]) 07/20/2019  . Hallucination 07/20/2019   PCP:  Alfonse Flavors, MD Pharmacy:   [facility], [place] [address] Phone: [phone] Fax: [phone]     Social Determinants of Health (SDOH) Interventions    Readmission Risk Interventions Readmission Risk Prevention Plan 04/23/2020 07/28/2019  07/21/2019  Transportation Screening Complete - Complete  PCP or Specialist Appt within 3-5 Days Complete Not Complete -  Not Complete comments - Pt going to SNF rehab where the SNF MD will follow -  Medication Review (RN CM) - - Complete  HRI or Home Care Consult Complete Not Complete -  HRI or Home Care Consult comments - Pt going to SNF rehab -  Social Work Consult for [place] Planning/Counseling Complete Complete -  Palliative Care Screening Not Complete Not Applicable -  Medication Review (RN Care Manager) Complete Complete -

## 2020-04-24 LAB — GLUCOSE, CAPILLARY
Glucose-Capillary: 164 mg/dL — ABNORMAL HIGH (ref 70–99)
Glucose-Capillary: 310 mg/dL — ABNORMAL HIGH (ref 70–99)
Glucose-Capillary: 268 mg/dL — ABNORMAL HIGH (ref 70–99)
Glucose-Capillary: 267 mg/dL — ABNORMAL HIGH (ref 70–99)

## 2020-04-24 LAB — BASIC METABOLIC PANEL
Anion gap: 11 (ref 5–15)
BUN: 52 mg/dL — ABNORMAL HIGH (ref 8–23)
CO2: 28 mmol/L (ref 22–32)
Calcium: 8.4 mg/dL — ABNORMAL LOW (ref 8.9–10.3)
Chloride: 102 mmol/L (ref 98–111)
Creatinine, Ser: 2.53 mg/dL — ABNORMAL HIGH (ref 0.44–1.00)
GFR calc Af Amer: 22 mL/min — ABNORMAL LOW (ref >60)
GFR calc non Af Amer: 19 mL/min — ABNORMAL LOW (ref >60)
Glucose, Bld: 179 mg/dL — ABNORMAL HIGH (ref 70–99)
Potassium: 3.3 mmol/L — ABNORMAL LOW (ref 3.5–5.1)
Sodium: 141 mmol/L (ref 135–145)

## 2020-04-24 MED ORDER — POTASSIUM CHLORIDE CRYS ER 20 MEQ PO TBCR
40.0000 meq | EXTENDED_RELEASE_TABLET | ORAL | Status: AC
  Administered 2020-04-24 (×2): 40 meq via ORAL
  Filled 2020-04-24 (×2): qty 2

## 2020-04-24 NOTE — NC FL2 (Signed)
[place] LEVEL OF CARE SCREENING TOOL     IDENTIFICATION  Patient Name: Victoria Winters Birthdate: May 01, 1951 Sex: female Admission Date (Current Location): 04/22/2020  [facility] and [place] Number:  [employer] and Address:  [place] [address], [place]      Provider Number: [id number]  Attending Physician Name and Address:  Roxan Hockey, MD  Relative Name and Phone Number:       Current Level of Care: Hospital Recommended Level of Care: [place] Prior Approval Number:    Date Approved/Denied:   PASRR Number: [id number]  Discharge Plan: SNF    Current Diagnoses: Patient Active Problem List   Diagnosis Date Noted  . Acute on chronic heart failure with preserved ejection fraction (HFpEF) --grade 2 diastolic dysfunction 77/41/2878  . Acute respiratory failure with hypoxia ([place]) 04/23/2020  . Heart failure ([place]) 04/22/2020  . Acute and chronic respiratory failure with hypoxia ([place]) 04/17/2020  . DNR (do not resuscitate) 04/17/2020  . Leukocytosis 04/17/2020  . Anemia in chronic kidney disease (CKD) 04/17/2020  . Closed fracture of distal end of right fibula with routine healing 11/23/2019  . Palliative care by specialist   . Goals of care, counseling/discussion   . History of CVA (cerebrovascular accident)   . Acute encephalopathy 07/21/2019  . Fall 07/20/2019  . 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 Brain aneurysm 07/20/2019  . DM (diabetes mellitus) ([place]) 07/20/2019  . Bilateral fibular fractures 07/20/2019  . CVA (cerebral vascular accident) ([place]) 07/20/2019  . Hallucination 07/20/2019    Orientation RESPIRATION BLADDER Height & Weight     Self, Time  Normal Continent Weight: 219 lb 5.7 oz (99.5 kg) Height:  5\' 1"  (154.9 cm)  BEHAVIORAL SYMPTOMS/MOOD NEUROLOGICAL BOWEL NUTRITION STATUS      Continent Diet(see dc summary)  AMBULATORY STATUS COMMUNICATION OF NEEDS Skin    Extensive Assist Verbally Normal                       Personal Care Assistance Level of Assistance  Bathing, Feeding, Dressing Bathing Assistance: Limited assistance Feeding assistance: Independent Dressing Assistance: Limited assistance     Functional Limitations Info  Sight, Hearing, Speech Sight Info: Adequate Hearing Info: Adequate Speech Info: Adequate    SPECIAL CARE FACTORS FREQUENCY  PT (By licensed PT), OT (By licensed OT)     PT Frequency: 5x weekly OT Frequency: 3x weekly            Contractures Contractures Info: Not present    Additional Factors Info  Code Status, Allergies, Psychotropic Code Status Info: DNR Allergies Info: Penicillins, Chicken, Contrast Media, Eggs or Egg derived product, Milk related compounds, Nickel, Oatmeal, Wheat Bran Psychotropic Info: Lexapro, Seroquel         Current Medications (04/24/2020):  This is the current hospital active medication list Current Facility-Administered Medications  Medication Dose Route Frequency Provider Last Rate Last Admin  . 0.9 %  sodium chloride infusion  250 mL Intravenous PRN Arrien, Jimmy Picket, MD      . acetaminophen (TYLENOL) tablet 650 mg  650 mg Oral Q6H PRN Arrien, Jimmy Picket, MD      . aspirin EC tablet 81 mg  81 mg Oral Daily Arrien, Jimmy Picket, MD   81 mg at 04/24/20 0930  . atorvastatin (LIPITOR) tablet 20 mg  20 mg Oral Daily Arrien, Jimmy Picket, MD   20 mg at 04/24/20 0931  . escitalopram (LEXAPRO)  tablet 10 mg  10 mg Oral q morning - 10a Arrien, Jimmy Picket, MD   10 mg at 04/24/20 0929  . fludrocortisone (FLORINEF) tablet 200 mcg  200 mcg Oral Daily Arrien, Jimmy Picket, MD   200 mcg at 04/24/20 0931  . furosemide (LASIX) injection 60 mg  60 mg Intravenous BID Tawni Millers, MD   60 mg at 04/24/20 0927  . heparin injection 5,000 Units  5,000 Units Subcutaneous Q8H Arrien, Jimmy Picket, MD   5,000 Units at 04/24/20 1344  . hydrALAZINE  (APRESOLINE) tablet 25 mg  25 mg Oral TID Tawni Millers, MD   25 mg at 04/24/20 0929  . insulin aspart (novoLOG) injection 0-6 Units  0-6 Units Subcutaneous TID WC Arrien, Jimmy Picket, MD   4 Units at 04/24/20 1344  . isosorbide mononitrate (IMDUR) 24 hr tablet 30 mg  30 mg Oral Daily Arrien, Jimmy Picket, MD   30 mg at 04/24/20 0929  . loratadine (CLARITIN) tablet 10 mg  10 mg Oral q morning - 10a Arrien, Jimmy Picket, MD   10 mg at 04/24/20 0929  . metoprolol succinate (TOPROL-XL) 24 hr tablet 100 mg  100 mg Oral q morning - 10a Arrien, Jimmy Picket, MD   100 mg at 04/24/20 0929  . multivitamin with minerals tablet 1 tablet  1 tablet Oral Daily Arrien, Jimmy Picket, MD   1 tablet at 04/24/20 0930  . pregabalin (LYRICA) capsule 75 mg  75 mg Oral BID Tawni Millers, MD   75 mg at 04/24/20 0930  . QUEtiapine (SEROQUEL) tablet 25 mg  25 mg Oral QHS Arrien, Jimmy Picket, MD   25 mg at 04/23/20 2140  . sodium chloride flush (NS) 0.9 % injection 3 mL  3 mL Intravenous Q12H Arrien, Jimmy Picket, MD   3 mL at 04/24/20 0931  . sodium chloride flush (NS) 0.9 % injection 3 mL  3 mL Intravenous PRN Arrien, Jimmy Picket, MD         Discharge Medications: Please see discharge summary for a list of discharge medications.  Relevant Imaging Results:  Relevant Lab Results:   Additional Information SSN: [place], [place]

## 2020-04-24 NOTE — Plan of Care (Signed)
  Problem: Education: Goal: Ability to demonstrate management of disease process will improve Outcome: Progressing Goal: Ability to verbalize understanding of medication therapies will improve Outcome: Progressing   Problem: Clinical Measurements: Goal: Respiratory complications will improve Outcome: Progressing Goal: Cardiovascular complication will be avoided Outcome: Progressing

## 2020-04-24 NOTE — Plan of Care (Signed)
  Problem: Acute Rehab PT Goals(only PT should resolve) Goal: Pt Will Go Supine/Side To Sit Outcome: Progressing Flowsheets (Taken 04/24/2020 1408) Pt will go Supine/Side to Sit: with min guard assist Goal: Patient Will Transfer Sit To/From Stand Outcome: Progressing Flowsheets (Taken 04/24/2020 1408) Patient will transfer sit to/from stand:  with min guard assist  with minimal assist Goal: Pt Will Transfer Bed To Chair/Chair To Bed Outcome: Progressing Flowsheets (Taken 04/24/2020 1408) Pt will Transfer Bed to Chair/Chair to Bed: with min assist Goal: Pt Will Ambulate Outcome: Progressing Flowsheets (Taken 04/24/2020 1408) Pt will Ambulate:  50 feet  with minimal assist  with rolling walker   2:08 PM, 04/24/20 Lonell Grandchild, MPT Physical Therapist with [facility] [phone] office [phone] mobile phone

## 2020-04-24 NOTE — Progress Notes (Signed)
Attempted to ambulate her for oxygen saturation and home oxygen. Patient became dizzy, nauseous, and eyes rolled back when NT and I sat her up. Unsafe to continue oxygen evaluation. Laid patient back in bed.

## 2020-04-24 NOTE — Evaluation (Signed)
Physical Therapy Evaluation Patient Details Name: Victoria Winters MRN: [id number] DOB: [birth year] Today's Date: 04/24/2020   History of Present Illness  Salene Mohamud is a 69 y.o. female with medical history significant of diastolic heart failure, type 2 diabetes mellitus, stage V chronic kidney disease, cognitive impairment, cerebellar aneurysm and history of CVA.  Recent hospitalization 5/12-5/13 for decompensated diastolic heart failure, she was discharged in a stable condition, instructions to take furosemide 40 mg daily.  Apparently her sister is in charge of her medications and meals.Yesterday evening she had a acute recurrence of dyspnea, moderate to severe in intensity, persistent in nature, with no improving or worsening factors, associated with PND, orthopnea and lower extremity edema.    Clinical Impression  Patient demonstrates slow labored movement requiring bed rail and assistance to pull self to sitting, very unsteady on feet and at high risk for falls when taking steps with RW, limited secondary to fatigue and poor standing balance.  Patient on room air throughout visit with SpO2 at 91% and tolerated sitting up in chair after therapy - RN notified.  Patient will benefit from continued physical therapy in hospital and recommended venue below to increase strength, balance, endurance for safe ADLs and gait.     Follow Up Recommendations SNF    Equipment Recommendations  None recommended by PT    Recommendations for Other Services       Precautions / Restrictions Precautions Precautions: Fall Restrictions Weight Bearing Restrictions: No      Mobility  Bed Mobility Overal bed mobility: Needs Assistance Bed Mobility: Supine to Sit     Supine to sit: Min guard;Min assist     General bed mobility comments: slow labored movement requiring use of bed rail for supine to sitting  Transfers Overall transfer level: Needs assistance Equipment used: Rolling walker (2  wheeled) Transfers: Sit to/from [facility] Sit to Stand: Min assist;Mod assist Stand pivot transfers: Min assist;Mod assist       General transfer comment: very unsteady on feet with leaning over RW due to weakness  Ambulation/Gait Ambulation/Gait assistance: Mod assist Gait Distance (Feet): 5 Feet Assistive device: Rolling walker (2 wheeled) Gait Pattern/deviations: Decreased step length - right;Decreased step length - left;Decreased stride length Gait velocity: decreased   General Gait Details: limited to 5-6 slow labored steps at bedside with frequent leaning over RW due to weakness, fatigues easily, on room air with SpO2 at 91%  Stairs            Wheelchair Mobility    Modified Rankin (Stroke Patients Only)       Balance Overall balance assessment: Needs assistance Sitting-balance support: Feet supported;No upper extremity supported Sitting balance-Leahy Scale: Fair Sitting balance - Comments: seated at EOB   Standing balance support: During functional activity;Bilateral upper extremity supported Standing balance-Leahy Scale: Poor Standing balance comment: fair/poor using RW                             Pertinent Vitals/Pain Pain Assessment: No/denies pain    Home Living Family/patient expects to be discharged to:: Private residence Living Arrangements: Other relatives(sister) Available Help at Discharge: Family;Available 24 hours/day Type of Home: House Home Access: Ramped entrance     Home Layout: Multi-level;Able to live on main level with bedroom/bathroom Home Equipment: Shower seat;Hand held shower head;Grab bars - tub/shower;Grab bars - toilet;Wheelchair - [employer] - 2 wheels Additional Comments: chair lift    Prior Function Level of Independence: Needs  assistance   Gait / Transfers Assistance Needed: household ambulator using RW  ADL's / Homemaking Assistance Needed: assisted by family        Hand  Dominance   Dominant Hand: Left    Extremity/Trunk Assessment   Upper Extremity Assessment Upper Extremity Assessment: Generalized weakness    Lower Extremity Assessment Lower Extremity Assessment: Generalized weakness    Cervical / Trunk Assessment Cervical / Trunk Assessment: Normal  Communication   Communication: No difficulties  Cognition Arousal/Alertness: Awake/alert Behavior During Therapy: WFL for tasks assessed/performed Overall Cognitive Status: Within Functional Limits for tasks assessed                                        General Comments      Exercises     Assessment/Plan    PT Assessment Patient needs continued PT services  PT Problem List Decreased strength;Decreased activity tolerance;Decreased balance;Decreased mobility       PT Treatment Interventions Gait training;Stair training;Functional mobility training;Therapeutic activities;Therapeutic exercise;Patient/family education    PT Goals (Current goals can be found in the Care Plan section)  Acute Rehab PT Goals Patient Stated Goal: return home with family to assist PT Goal Formulation: With patient Time For Goal Achievement: 05/08/20 Potential to Achieve Goals: Good    Frequency Min 3X/week   Barriers to discharge        Co-evaluation               AM-PAC PT "6 Clicks" Mobility  Outcome Measure Help needed turning from your back to your side while in a flat bed without using bedrails?: None Help needed moving from lying on your back to sitting on the side of a flat bed without using bedrails?: A Little Help needed moving to and from a bed to a chair (including a wheelchair)?: A Lot Help needed standing up from a chair using your arms (e.g., wheelchair or bedside chair)?: A Lot Help needed to walk in hospital room?: A Lot Help needed climbing 3-5 steps with a railing? : A Lot 6 Click Score: 15    End of Session   Activity Tolerance: Patient tolerated treatment  well;Patient limited by fatigue Patient left: in chair;with call bell/phone within reach;with chair alarm set Nurse Communication: Mobility status PT Visit Diagnosis: Unsteadiness on feet (R26.81);Other abnormalities of gait and mobility (R26.89);Muscle weakness (generalized) (M62.81)    Time: 7035-0093 PT Time Calculation (min) (ACUTE ONLY): 28 min   Charges:   PT Evaluation $PT Eval Moderate Complexity: 1 Mod PT Treatments $Therapeutic Activity: 23-37 mins        2:06 PM, 04/24/20 Lonell Grandchild, MPT Physical Therapist with [facility] [phone] office [phone] mobile phone

## 2020-04-24 NOTE — Progress Notes (Signed)
Inpatient Diabetes Program Recommendations  AACE/ADA: New Consensus Statement on Inpatient Glycemic Control (2015)  Target Ranges:  Prepandial:   less than 140 mg/dL      Peak postprandial:   less than 180 mg/dL (1-2 hours)      Critically ill patients:  140 - 180 mg/dL   Lab Results  Component Value Date   GLUCAP 310 (H) 04/24/2020   HGBA1C 8.4 (H) 04/17/2020    Review of Glycemic Control Results for Victoria Winters, Victoria Winters (MRN [id number]) as of 04/24/2020 15:09  Ref. Range 04/23/2020 11:55 04/23/2020 16:13 04/23/2020 20:39 04/24/2020 07:25 04/24/2020 11:19  Glucose-Capillary Latest Ref Range: 70 - 99 mg/dL 228 (H) 232 (H) 222 (H) 164 (H) 310 (H)   Diabetes history: DM2 Outpatient Diabetes medications: Lantus 27 units QHS, Humalog 1-17 units TID with meals, Tradjenta 5 mg daily Current orders for Inpatient glycemic control: Novolog 0-6 units TID with meals   Inpatient Diabetes Program Recommendations:  -Lantus 10 units daily -Novolog 3 units tid meal coverage if eats 50%  Thank you, Bethena Roys E. Kendalynn Wideman, RN, MSN, CDE  Diabetes Coordinator Inpatient Glycemic Control Team Team Pager [phone] (8am-5pm) 04/24/2020 3:09 PM

## 2020-04-24 NOTE — TOC Progression Note (Signed)
Transition of Care [facility]) - Progression Note    Patient Details  Name: Victoria Winters MRN: [id number] Date of Birth: January 20, 1951  Transition [facility]) CM/SW Contact  Shade Flood, LCSW Phone Number: 04/24/2020, 2:35 PM  Clinical Narrative:     PT recommending SNF. Discussed with pt/sister and will refer as requested. SNF options were provided and pt has been to SNF in the past so pt/family familiar with options. Will start insurance auth as well.  Expected Discharge Plan: Skilled Nursing Facility Barriers to Discharge: Continued Medical Work up  Expected Discharge Plan and Services Expected Discharge Plan: [place] In-house Referral: Clinical Social Work   Post Acute Care Choice: [place] Living arrangements for the past 2 months: Single Family Home Expected Discharge Date: 04/24/20                                     Social Determinants of Health (SDOH) Interventions    Readmission Risk Interventions Readmission Risk Prevention Plan 04/23/2020 07/28/2019 07/21/2019  Transportation Screening Complete - Complete  PCP or Specialist Appt within 3-5 Days Complete Not Complete -  Not Complete comments - Pt going to SNF rehab where the SNF MD will follow -  Medication Review (RN CM) - - Complete  HRI or Home Care Consult Complete Not Complete -  [place] or Home Care Consult comments - Pt going to SNF rehab -  Social Work Consult for [place] Planning/Counseling Complete Complete -  Palliative Care Screening Not Complete Not Applicable -  Medication Review [employer]) Complete Complete -

## 2020-04-24 NOTE — Progress Notes (Signed)
Patient Demographics:    Nikie Cid, is a 69 y.o. female, DOB - Jan 24, 1951, WJX:[id number]  Admit date - 04/22/2020   Admitting Physician Mauricio Gerome Apley, MD  Outpatient Primary MD for the patient is Zhou-Talbert, Elwyn Lade, MD  LOS - 2   Chief Complaint  Patient presents with   Shortness of Breath        Subjective:    [name] today has no fevers, no emesis,  No chest pain,    --Patient is very weak and unsteady -High fall risk -No significant shortness of breath at rest   Assessment  & Plan :    Principal Problem:   Acute on chronic heart failure with preserved ejection fraction (HFpEF) --grade 2 diastolic dysfunction Active Problems:   Acute respiratory failure with hypoxia (HCC)   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 female with medical history significant of diastolic heart failure, type 2 diabetes mellitus, stage V chronic kidney disease, cognitive impairment, cerebellar aneurysm and history of CVA admitted on 04/22/2020 with acute on chronic diastolic dysfunction exacerbation   A/p 1)HFpEF-acute on chronic diastolic CHF exacerbation in a patient with grade 2 diastolic dysfunction and EF of 65% --Continue IV diuresis with Lasix, daily weights fluid input and output monitoring  2)Acute Hypoxic Respiratory Failure--most likely secondary to #1 above, on admission D-dimer was elevated, VQ scan was indeterminate/intermediate probability--- clinically low index of suspicion for PE -Patient baseline myoglobin around 9 hemoglobin currently 8.7 patient at high risk for bleeding -Lower extremity venous Dopplers negative for DVT -Unable to do CTA chest to rule out PE due to CKD 4 with GFR around  20 -Holding off on anticoagulation for now - continue DVT prophylaxis  3)CKD IV--creatinine currently 2.53 which is around patient's recent baseline -Monitor renal function closely with IV diuresis as above #1  4)Chronic Anemia--- suspect due to underlying CKD, globin usually around 9 hemoglobin currently 8.7 continue to monitor closely -- 5)DM2--A1c is 8.4, reflecting uncontrolled DM, use Lantus insulin and sliding scale coverage with NovoLog  -PTA patient was also on Tradjenta  6)Adrenal  Insufficiency--- continue fludrocortisone, midodrine on hold for now  7) depressive disorder--stable, continue Seroquel and Lexapro  8) cognitive impairment--- history of prior aneurysm, if history of prior stroke, patient sister helps her make decisions  9) generalized weakness/deconditioning/ambulatory dysfunction----please see nursing documentation from RN Santa Lighter time 11:41 AM on 04/24/2020 -Please see PT eval, SNF rehab recommended -Patient and family have agreeable  Disposition/Need for in-Hospital Stay- patient unable to be discharged at this time due to--- diastolic congestive failure with hypoxia requiring IV Lasix/ diuresis----  -Patient From: home D/C Place: Will need SNF rehab  barriers: Not Clinically Stable-   Code Status : DNR  Family Communication:  (patient is alert, awake and coherent)  Consults  :  na  DVT Prophylaxis  :    Heparin - SCDs    Lab Results  Component Value Date   PLT 209 04/22/2020    Inpatient Medications  Scheduled Meds:  aspirin EC  81 mg  Oral Daily   atorvastatin  20 mg Oral Daily   escitalopram  10 mg Oral q morning - 10a   fludrocortisone  200 mcg Oral Daily   furosemide  60 mg Intravenous BID   heparin  5,000 Units Subcutaneous Q8H   hydrALAZINE  25 mg Oral TID   insulin aspart  0-6 Units Subcutaneous TID WC   isosorbide mononitrate  30 mg Oral Daily   loratadine  10 mg Oral q morning - 10a   metoprolol succinate  100 mg  Oral q morning - 10a   multivitamin with minerals  1 tablet Oral Daily   pregabalin  75 mg Oral BID   QUEtiapine  25 mg Oral QHS   sodium chloride flush  3 mL Intravenous Q12H   Continuous Infusions:  sodium chloride     PRN Meds:.sodium chloride, acetaminophen, sodium chloride flush   Anti-infectives (From admission, onward)   None        Objective:   Vitals:   04/24/20 0501 04/24/20 0929 04/24/20 1113 04/24/20 1421  BP: 140/72  (!) 141/61 (!) 163/64  Pulse: 60 71 65 65  Resp:    18  Temp: (!) 97.5 F (36.4 C)   98.5 F (36.9 C)  TempSrc: Oral   Oral  SpO2: 98%  94% 93%  Weight:      Height:        Wt Readings from Last 3 Encounters:  04/24/20 99.5 kg  04/18/20 99.8 kg  10/23/19 95.3 kg    Intake/Output Summary (Last 24 hours) at 04/24/2020 1847 Last data filed at 04/24/2020 1700 Gross per 24 hour  Intake 960 ml  Output 1600 ml  Net -640 ml     Physical Exam  Gen:- Awake Alert, morbidly obese, HEENT:- [place].AT, No sclera icterus Nose- [place] 2 L/min Neck-Supple Neck +ve JVD,.  Lungs-improving air movement,, no wheezing, faint bibasilar rales CV- S1, S2 normal, regular  Abd-  +ve B.Sounds, Abd Soft, No tenderness,    Extremity/Skin:-1 +  edema, pedal pulses present  Psych-affect is appropriate, oriented x3 Neuro-generalized weakness, no new focal deficits, no tremors   Data Review:   Micro Results Recent Results (from the past 240 hour(s))  SARS Coronavirus 2 by RT PCR (hospital order, performed in [facility] hospital lab) Nasopharyngeal Nasopharyngeal Swab     Status: None   Collection Time: 04/17/20  1:26 PM   Specimen: Nasopharyngeal Swab  Result Value Ref Range Status   SARS Coronavirus 2 NEGATIVE NEGATIVE Final    Comment: (NOTE) SARS-CoV-2 target nucleic acids are NOT DETECTED. The SARS-CoV-2 RNA is generally detectable in upper and lower respiratory specimens during the acute phase of infection. The lowest concentration of SARS-CoV-2 viral  copies this assay can detect is 250 copies / mL. A negative result does not preclude SARS-CoV-2 infection and should not be used as the sole basis for treatment or other patient management decisions.  A negative result may occur with improper specimen collection / handling, submission of specimen other than nasopharyngeal swab, presence of viral mutation(s) within the areas targeted by this assay, and inadequate number of viral copies (<250 copies / mL). A negative result must be combined with clinical observations, patient history, and epidemiological information. Fact Sheet for Patients:   StrictlyIdeas.no Fact Sheet for Healthcare Providers: BankingDealers.co.za This test is not yet approved or cleared  by the [place] FDA and has been authorized for detection and/or diagnosis of SARS-CoV-2 by FDA under an Emergency Use Authorization (EUA).  This EUA will remain in effect (meaning this test can be used) for the duration of the COVID-19 declaration under Section 564(b)(1) of the Act, 21 U.S.C. section 360bbb-3(b)(1), unless the authorization is terminated or revoked sooner. Performed at [facility], [address], [place], [place] [address]   Urine culture     Status: None   Collection Time: 04/17/20  3:19 PM   Specimen: Urine, Clean Catch  Result Value Ref Range Status   Specimen Description   Final    URINE, CLEAN CATCH Performed at [facility], [address], [place], [place] [address]    Special Requests   Final    NONE Performed at [facility], [address] [place], [place] [address]    Culture   Final    NO GROWTH Performed at [facility], [place] [address], [place], [place] [address]    Report Status 04/18/2020 FINAL  Final  SARS Coronavirus 2 by RT PCR (hospital order, performed in [facility] hospital lab) Nasopharyngeal Nasopharyngeal Swab     Status: None   Collection Time: 04/22/20 10:49 AM   Specimen:  Nasopharyngeal Swab  Result Value Ref Range Status   SARS Coronavirus 2 NEGATIVE NEGATIVE Final    Comment: (NOTE) SARS-CoV-2 target nucleic acids are NOT DETECTED. The SARS-CoV-2 RNA is generally detectable in upper and lower respiratory specimens during the acute phase of infection. The lowest concentration of SARS-CoV-2 viral copies this assay can detect is 250 copies / mL. A negative result does not preclude SARS-CoV-2 infection and should not be used as the sole basis for treatment or other patient management decisions.  A negative result may occur with improper specimen collection / handling, submission of specimen other than nasopharyngeal swab, presence of viral mutation(s) within the areas targeted by this assay, and inadequate number of viral copies (<250 copies / mL). A negative result must be combined with clinical observations, patient history, and epidemiological information. Fact Sheet for Patients:   StrictlyIdeas.no Fact Sheet for Healthcare Providers: BankingDealers.co.za This test is not yet approved or cleared  by the [place] FDA and has been authorized for detection and/or diagnosis of SARS-CoV-2 by FDA under an Emergency Use Authorization (EUA).  This EUA will remain in effect (meaning this test can be used) for the duration of the COVID-19 declaration under Section 564(b)(1) of the Act, 21 U.S.C. section 360bbb-3(b)(1), unless the authorization is terminated or revoked sooner. Performed at [facility], [address], [place], [place] [address]     Radiology Reports DG Chest 2 View  Result Date: 04/22/2020 CLINICAL DATA:  Shortness of breath, chest pain. Additional history provided: Patient presents with chest pain and shortness of breath since yesterday, denies fever, history of diabetes and CKD, COVID negative Apr 17, 2020 EXAM: CHEST - 2 VIEW COMPARISON:  Prior chest radiograph 04/17/2020 FINDINGS:  Unchanged cardiomegaly. Central pulmonary vascular congestion. Prominence of the interstitial lung markings bilaterally. Small bilateral pleural effusions. No evidence of pneumothorax. No acute bony abnormality is identified. IMPRESSION: Unchanged cardiomegaly with central pulmonary vascular congestion. Bilateral interstitial prominence likely reflects interstitial edema. Atypical/viral pneumonia cannot be excluded. Small bilateral pleural effusions. Electronically Signed   By: Kellie Simmering DO   On: 04/22/2020 10:19   CT Head Wo Contrast  Result Date: 04/17/2020 CLINICAL DATA:  Ataxia, stroke suspected. Additional history provided: Increased confusion, weakness since 11 p.m. last night, history of stroke, short-term memory loss at baseline EXAM: CT HEAD WITHOUT CONTRAST TECHNIQUE: Contiguous axial images were obtained  from the base of the skull through the vertex without intravenous contrast. COMPARISON:  Brain MRI 07/21/2019, head CT 07/20/2019. FINDINGS: Brain: Redemonstrated chronic lacunar infarcts within the cerebral white matter and basal ganglia bilaterally. A known chronic infarct within the paramedian right pons was better appreciated on prior MRI 07/21/2019. Stable, mild generalized parenchymal atrophy. There is no acute intracranial hemorrhage. No demarcated cortical infarct. No extra-axial fluid collection. No evidence of intracranial mass. No midline shift. Partially empty sella turcica. Vascular: No hyperdense vessel.  Atherosclerotic calcifications. Skull: Normal. Negative for fracture or focal lesion. Sinuses/Orbits: Visualized orbits show no acute finding. Minimal scattered paranasal sinus mucosal thickening. No significant mastoid effusion at the imaged levels. IMPRESSION: 1. No CT evidence of acute intracranial abnormality. 2. Redemonstrated chronic lacunar infarcts within the bilateral cerebral white matter and basal ganglia. 3. A known chronic infarct within the paramedian right pons was  better appreciated on prior MRI 07/21/2019. 4. Minimal scattered paranasal sinus mucosal thickening. Electronically Signed   By: Kellie Simmering DO   On: 04/17/2020 11:23   MR BRAIN WO CONTRAST  Result Date: 04/17/2020 CLINICAL DATA:  Encephalopathy EXAM: MRI HEAD WITHOUT CONTRAST TECHNIQUE: Multiplanar, multiecho pulse sequences of the brain and surrounding structures were obtained without intravenous contrast. COMPARISON:  07/21/2019 FINDINGS: Brain: There is no acute infarction or intracranial hemorrhage. There is no intracranial mass, mass effect, or edema. There is no hydrocephalus or extra-axial fluid collection. There are multiple chronic small vessel infarcts including involvement of bilateral basal ganglia and adjacent white matter as well as the right pons. There is hemosiderin deposition associated with the left basal ganglia infarct. Additional patchy T2 hyperintensity in the supratentorial white matter is nonspecific but may reflect mild chronic microvascular ischemic changes. Ventricles and sulci are within normal limits in size and configuration. Vascular: Major vessel flow voids at the skull base are preserved. Skull and upper cervical spine: Normal marrow signal is preserved. Sinuses/Orbits: Trace mucosal thickening. Bilateral lens replacements. Other: Incidental note is made of a partially empty sella. Trace mastoid fluid opacification. IMPRESSION: No evidence of recent infarction, hemorrhage, or mass. Stable chronic findings detailed above. Electronically Signed   By: Macy Mis M.D.   On: 04/17/2020 14:14   NM PULMONARY VENT AND PERF (V/Q Scan)  Result Date: 04/22/2020 CLINICAL DATA:  Shortness of breath and chest pain for 1 day EXAM: NUCLEAR MEDICINE VENTILATION - PERFUSION LUNG SCAN TECHNIQUE: Ventilation images were obtained in multiple projections using inhaled aerosol Tc-11m DTPA. Perfusion images were obtained in multiple projections after intravenous injection of Tc-58m MAA.  RADIOPHARMACEUTICALS:  44 mCi of Tc-101m DTPA aerosol inhalation and 1.53 mCi Tc69m MAA IV COMPARISON:  None Correlation: Chest radiograph 04/22/2020 FINDINGS: Ventilation: Decreased ventilation LEFT lower lobe. Subsegmental diminished ventilation posterior RIGHT lower lobe. Remaining ventilation normal. Perfusion: Matching area of diminished perfusion in LEFT lower lobe and smaller matching subsegmental defect in posterior RIGHT lower lobe. Remaining perfusion normal. Chest radiograph: Enlargement of cardiac silhouette with BILATERAL pleural effusions and pulmonary edema, question more prominent in LEFT lower lobe. IMPRESSION: Matching ventilation, perfusion and radiographic abnormalities in the lower lobes bilaterally LEFT greater than RIGHT. Findings represent an intermediate probability for pulmonary embolism. Electronically Signed   By: Lavonia Dana M.D.   On: 04/22/2020 18:06   US Venous Img Lower Bilateral (DVT)  Result Date: 04/23/2020 CLINICAL DATA:  Lower extremity edema. History of previous DVT. Evaluate for acute or chronic DVT. EXAM: BILATERAL LOWER EXTREMITY VENOUS DOPPLER ULTRASOUND TECHNIQUE: Gray-scale sonography with graded compression,  as well as color Doppler and duplex ultrasound were performed to evaluate the lower extremity deep venous systems from the level of the common femoral vein and including the common femoral, femoral, profunda femoral, popliteal and calf veins including the posterior tibial, peroneal and gastrocnemius veins when visible. The superficial great saphenous vein was also interrogated. Spectral Doppler was utilized to evaluate flow at rest and with distal augmentation maneuvers in the common femoral, femoral and popliteal veins. COMPARISON:  None. FINDINGS: RIGHT LOWER EXTREMITY Common Femoral Vein: No evidence of thrombus. Normal compressibility, respiratory phasicity and response to augmentation. Saphenofemoral Junction: No evidence of thrombus. Normal compressibility  and flow on color Doppler imaging. Profunda Femoral Vein: No evidence of thrombus. Normal compressibility and flow on color Doppler imaging. Femoral Vein: No evidence of thrombus. Normal compressibility, respiratory phasicity and response to augmentation. Popliteal Vein: No evidence of thrombus. Normal compressibility, respiratory phasicity and response to augmentation. Calf Veins: No evidence of thrombus. Normal compressibility and flow on color Doppler imaging. Superficial Great Saphenous Vein: No evidence of thrombus. Normal compressibility. Venous Reflux:  None. Other Findings:  None. LEFT LOWER EXTREMITY Common Femoral Vein: No evidence of thrombus. Normal compressibility, respiratory phasicity and response to augmentation. Saphenofemoral Junction: No evidence of thrombus. Normal compressibility and flow on color Doppler imaging. Profunda Femoral Vein: No evidence of thrombus. Normal compressibility and flow on color Doppler imaging. Femoral Vein: No evidence of thrombus. Normal compressibility, respiratory phasicity and response to augmentation. Popliteal Vein: No evidence of thrombus. Normal compressibility, respiratory phasicity and response to augmentation. Calf Veins: No evidence of thrombus. Normal compressibility and flow on color Doppler imaging. Superficial Great Saphenous Vein: No evidence of thrombus. Normal compressibility. Venous Reflux:  None. Other Findings:  None. IMPRESSION: No evidence of acute or chronic DVT within either lower extremity. Electronically Signed   By: Sandi Mariscal M.D.   On: 04/23/2020 12:32   DG Chest Portable 1 View  Result Date: 04/17/2020 CLINICAL DATA:  Shortness of breath. Additional provided: Wheezing, increased confusion, weakness since 11 p.m. last night, pending COVID test. EXAM: PORTABLE CHEST 1 VIEW COMPARISON:  Chest radiograph 09/05/2019 FINDINGS: Unchanged cardiomegaly. Central pulmonary vascular congestion. Ill-defined opacities at the bilateral lung bases.  No definite pleural effusion or evidence of pneumothorax. 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 IMPRESSION: Unchanged cardiomegaly.  Central pulmonary vascular congestion. Ill-defined opacities at the bilateral lung bases may reflect edema, atelectasis or pneumonia. Electronically Signed   By: Kellie Simmering DO   On: 04/17/2020 11:12   ECHOCARDIOGRAM COMPLETE  Result Date: 04/18/2020    ECHOCARDIOGRAM REPORT   Patient Name:   DANIQUA CAMPOY Date of Exam: 04/18/2020 Medical Rec #:  [id number]  Height:       61.0 in Accession #:    [id number] Weight:       220.0 lb Date of Birth:  12-07-1951 BSA:          1.967 m Patient Age:    19 years   BP:           159/64 mmHg Patient Gender: F          HR:           72 bpm. Exam Location:  [facility] Procedure: 2D Echo, Cardiac Doppler and Color Doppler Indications:    I50.23 Acute on chronic systolic (congestive) heart failure  History:        Patient has no prior history of Echocardiogram examinations.  Stroke; Risk Factors:Diabetes. CKD.  Sonographer:    Jonelle Sidle Dance Referring Phys: [place]  1. Left ventricular ejection fraction, by estimation, is 65 to 70%. The left ventricle has normal function. The left ventricle has no regional wall motion abnormalities. There is mild concentric left ventricular hypertrophy. Left ventricular diastolic parameters are consistent with Grade II diastolic dysfunction (pseudonormalization). Elevated left ventricular end-diastolic pressure.  2. Right ventricular systolic function is normal. The right ventricular size is normal. Tricuspid regurgitation signal is inadequate for assessing PA pressure.  3. Left atrial size was moderately dilated.  4. The mitral valve is grossly normal. Mild mitral valve regurgitation.  5. The aortic valve has an indeterminant number of cusps. Aortic valve regurgitation is not visualized.  6. The inferior vena cava  is dilated in size with >50% respiratory variability, suggesting right atrial pressure of 8 mmHg.  7. A small pericardial effusion is present. The pericardial effusion is circumferential. There is no evidence of cardiac tamponade. FINDINGS  Left Ventricle: Left ventricular ejection fraction, by estimation, is 65 to 70%. The left ventricle has normal function. The left ventricle has no regional wall motion abnormalities. The left ventricular internal cavity size was normal in size. There is  mild concentric left ventricular hypertrophy. Left ventricular diastolic parameters are consistent with Grade II diastolic dysfunction (pseudonormalization). Elevated left ventricular end-diastolic pressure. Right Ventricle: The right ventricular size is normal. No increase in right ventricular wall thickness. Right ventricular systolic function is normal. Tricuspid regurgitation signal is inadequate for assessing PA pressure. Left Atrium: Left atrial size was moderately dilated. Right Atrium: Right atrial size was normal in size. Pericardium: A small pericardial effusion is present. The pericardial effusion is circumferential. There is no evidence of cardiac tamponade. Mitral Valve: The mitral valve is grossly normal. Mild mitral annular calcification. Mild mitral valve regurgitation. Tricuspid Valve: The tricuspid valve is grossly normal. Tricuspid valve regurgitation is mild. Aortic Valve: The aortic valve has an indeterminant number of cusps. Aortic valve regurgitation is not visualized. Mild aortic valve annular calcification. There is mild calcification of the aortic valve. Pulmonic Valve: The pulmonic valve was not well visualized. Pulmonic valve regurgitation is not visualized. Aorta: The aortic root is normal in size and structure. Venous: The inferior vena cava is dilated in size with greater than 50% respiratory variability, suggesting right atrial pressure of 8 mmHg. IAS/Shunts: No atrial level shunt detected by color  flow Doppler.  LEFT VENTRICLE PLAX 2D LVIDd:         4.58 cm  Diastology LVIDs:         3.69 cm  LV e' lateral:   4.57 cm/s LV PW:         1.30 cm  LV E/e' lateral: 35.2 LV IVS:        1.13 cm  LV e' medial:    3.92 cm/s LVOT diam:     2.00 cm  LV E/e' medial:  41.1 LV SV:         90 LV SV Index:   46 LVOT Area:     3.14 cm  RIGHT VENTRICLE RV Basal diam:  2.79 cm RV S prime:     13.50 cm/s TAPSE (M-mode): 2.4 cm LEFT ATRIUM              Index       RIGHT ATRIUM           Index LA diam:        4.60 cm  2.34 cm/m  RA Area:     16.00 cm LA Vol (A2C):   115.0 ml 58.47 ml/m RA Volume:   38.30 ml  19.47 ml/m LA Vol (A4C):   75.0 ml  38.13 ml/m LA Biplane Vol: 95.2 ml  48.40 ml/m  AORTIC VALVE LVOT Vmax:   100.00 cm/s LVOT Vmean:  69.900 cm/s LVOT VTI:    0.285 m  AORTA Ao Root diam: 3.20 cm Ao Asc diam:  3.60 cm MITRAL VALVE MV Area (PHT): 3.54 cm     SHUNTS MV Decel Time: 214 msec     Systemic VTI:  0.29 m MV E velocity: 161.00 cm/s  Systemic Diam: 2.00 cm MV A velocity: 149.00 cm/s MV E/A ratio:  1.08 Kate Sable MD Electronically signed by Kate Sable MD Signature Date/Time: 04/18/2020/10:06:32 AM    Final      CBC Recent Labs  Lab 04/18/20 0545 04/22/20 0945  WBC 10.9* 12.6*  HGB 9.1* 8.7*  HCT 29.1* 27.7*  PLT 167 209  MCV 88.2 87.9  MCH 27.6 27.6  MCHC 31.3 31.4  RDW 14.2 14.4  LYMPHSABS 2.7 3.8  MONOABS 0.8 0.8  EOSABS 0.2 1.2*  BASOSABS 0.0 0.1    Chemistries  Recent Labs  Lab 04/18/20 0545 04/22/20 0945 04/23/20 0524 04/24/20 0516  NA 143 141 145 141  K 3.7 4.1 4.1 3.3*  CL 104 105 106 102  CO2 29 25 31 28   GLUCOSE 194* 173* 218* 179*  BUN 45* 54* 54* 52*  CREATININE 2.31* 2.40* 2.40* 2.53*  CALCIUM 8.1* 8.7* 8.2* 8.4*  AST  --  20  --   --   ALT  --  14  --   --   ALKPHOS  --  85  --   --   BILITOT  --  1.0  --   --    ------------------------------------------------------------------------------------------------------------------ No results for  input(s): CHOL, HDL, LDLCALC, TRIG, CHOLHDL, LDLDIRECT in the last 72 hours.  Lab Results  Component Value Date   HGBA1C 8.4 (H) 04/17/2020   ------------------------------------------------------------------------------------------------------------------ No results for input(s): TSH, T4TOTAL, T3FREE, THYROIDAB in the last 72 hours.  Invalid input(s): FREET3 ------------------------------------------------------------------------------------------------------------------ No results for input(s): VITAMINB12, FOLATE, FERRITIN, TIBC, IRON, RETICCTPCT in the last 72 hours.  Coagulation profile No results for input(s): INR, PROTIME in the last 168 hours.  Recent Labs    04/22/20 0945  DDIMER 2.50*    Cardiac Enzymes No results for input(s): CKMB, TROPONINI, MYOGLOBIN in the last 168 hours.  Invalid input(s): CK ------------------------------------------------------------------------------------------------------------------    Component Value Date/Time   BNP 769.0 (H) 04/22/2020 0945     Roxan Hockey M.D on 04/24/2020 at 6:47 PM  Go to [email or web address] - for contact info  [facility] - Office  [phone]

## 2020-04-24 NOTE — Care Management Important Message (Signed)
Important Message  Patient Details  Name: Victoria Winters MRN: [id number] Date of Birth: Jul 06, 1951   Medicare Important Message Given:  Yes     Tommy Medal 04/24/2020, 12:18 PM

## 2020-04-25 LAB — BASIC METABOLIC PANEL
Anion gap: 10 (ref 5–15)
BUN: 52 mg/dL — ABNORMAL HIGH (ref 8–23)
CO2: 31 mmol/L (ref 22–32)
Calcium: 8.6 mg/dL — ABNORMAL LOW (ref 8.9–10.3)
Chloride: 103 mmol/L (ref 98–111)
Creatinine, Ser: 2.49 mg/dL — ABNORMAL HIGH (ref 0.44–1.00)
GFR calc Af Amer: 22 mL/min — ABNORMAL LOW (ref >60)
GFR calc non Af Amer: 19 mL/min — ABNORMAL LOW (ref >60)
Glucose, Bld: 224 mg/dL — ABNORMAL HIGH (ref 70–99)
Potassium: 4 mmol/L (ref 3.5–5.1)
Sodium: 144 mmol/L (ref 135–145)

## 2020-04-25 LAB — GLUCOSE, CAPILLARY
Glucose-Capillary: 227 mg/dL — ABNORMAL HIGH (ref 70–99)
Glucose-Capillary: 340 mg/dL — ABNORMAL HIGH (ref 70–99)

## 2020-04-25 MED ORDER — ASPIRIN EC 81 MG PO TBEC: 81.0000 mg | DELAYED_RELEASE_TABLET | Freq: Every day | ORAL | 3 refills | Status: DC

## 2020-04-25 MED ORDER — HYDRALAZINE HCL 25 MG PO TABS: 25.0000 mg | ORAL_TABLET | Freq: Three times a day (TID) | ORAL | 3 refills | Status: DC

## 2020-04-25 MED ORDER — MIDODRINE HCL 10 MG PO TABS: 10.0000 mg | ORAL_TABLET | Freq: Three times a day (TID) | ORAL | 3 refills | Status: DC

## 2020-04-25 MED ORDER — FUROSEMIDE 40 MG PO TABS: 40.0000 mg | ORAL_TABLET | Freq: Two times a day (BID) | ORAL | 2 refills | Status: DC

## 2020-04-25 NOTE — Progress Notes (Signed)
SATURATION QUALIFICATIONS: (This note is used to comply with regulatory documentation for home oxygen)  Patient Saturations on Room Air at Rest = 94%  Patient Saturations on Room Air while Ambulating = N/A%  Patient Saturations on 0 Liters of oxygen while Ambulating = N/A%  Please briefly explain why patient needs home oxygen: patient unable to ambulate

## 2020-04-25 NOTE — TOC Transition Note (Signed)
Transition of Care [facility]) - CM/SW Discharge Note   Patient Details  Name: Jorie Zee MRN: [id number] Date of Birth: Aug 04, 1951  [facility] CM/SW Contact:  Shade Flood, LCSW Phone Number: 04/25/2020, 3:00 PM   Clinical Narrative:     Pt stable for dc per MD. Pt's insurance authorized SNF and Debbie at [facility] states they can take pt today. DC clinical will be sent electronically. RN to call report. EMS form printed to the floor.  Secure VMM left for pt's sister to update. This LCSW had spoken with pt's sister this AM to review bed offers and also inform that if insurance auth received, pt would transfer today.  There are no other TOC needs for dc.   Final next level of care: Skilled Nursing Facility Barriers to Discharge: Barriers Resolved   Patient Goals and CMS Choice Patient states their goals for this hospitalization and ongoing recovery are:: Return home      Discharge Placement   Existing PASRR number confirmed : 04/24/20          Patient chooses bed at: Other - please specify in the comment section below:([facility]) Patient to be transferred to facility by: EMS Name of family member notified: [name] Patient and family notified of of transfer: 04/25/20  Discharge Plan and Services In-house Referral: Clinical Social Work   Post Acute Care Choice: [facility]                               Social Determinants of Health (SDOH) Interventions     Readmission Risk Interventions Readmission Risk Prevention Plan 04/23/2020 07/28/2019 07/21/2019  Transportation Screening Complete - Complete  PCP or Specialist Appt within 3-5 Days Complete Not Complete -  Not Complete comments - Pt going to SNF rehab where the SNF MD will follow -  Medication Review (RN CM) - - Complete  HRI or Home Care Consult Complete Not Complete -  HRI or Home Care Consult comments - Pt going to SNF rehab -  Social Work Consult for [place]  Planning/Counseling Complete Complete -  Palliative Care Screening Not Complete Not Applicable -  Medication Review Press photographer) Complete Complete -

## 2020-04-25 NOTE — Plan of Care (Signed)
  Problem: Education: Goal: Ability to demonstrate management of disease process will improve Outcome: Progressing Goal: Ability to verbalize understanding of medication therapies will improve Outcome: Progressing   Problem: Activity: Goal: Capacity to carry out activities will improve Outcome: Progressing   Problem: Clinical Measurements: Goal: Will remain free from infection Outcome: Progressing Goal: Diagnostic test results will improve Outcome: Progressing

## 2020-04-25 NOTE — Progress Notes (Signed)
Still awaiting EMS transport for patient.

## 2020-04-25 NOTE — Plan of Care (Signed)
  Problem: Education: Goal: Ability to demonstrate management of disease process will improve 04/25/2020 1540 by Santa Lighter, RN Outcome: Adequate for Discharge 04/25/2020 1538 by Santa Lighter, RN Outcome: Progressing Goal: Ability to verbalize understanding of medication therapies will improve 04/25/2020 1540 by Santa Lighter, RN Outcome: Adequate for Discharge 04/25/2020 1538 by Santa Lighter, RN Outcome: Progressing Goal: Individualized Educational Video(s) Outcome: Adequate for Discharge   Problem: Cardiac: Goal: Ability to achieve and maintain adequate cardiopulmonary perfusion will improve Outcome: Adequate for Discharge   Problem: Education: Goal: Knowledge of General Education information will improve Description: Including pain rating scale, medication(s)/side effects and non-pharmacologic comfort measures Outcome: Adequate for Discharge   Problem: Cardiac: Goal: Ability to achieve and maintain adequate cardiopulmonary perfusion will improve Outcome: Adequate for Discharge   Problem: Health Behavior/Discharge Planning: Goal: Ability to manage health-related needs will improve Outcome: Adequate for Discharge   Problem: Clinical Measurements: Goal: Ability to maintain clinical measurements within normal limits will improve Outcome: Adequate for Discharge Goal: Will remain free from infection 04/25/2020 1540 by Santa Lighter, RN Outcome: Adequate for Discharge 04/25/2020 1538 by Santa Lighter, RN Outcome: Progressing Goal: Diagnostic test results will improve 04/25/2020 1540 by Santa Lighter, RN Outcome: Adequate for Discharge 04/25/2020 1538 by Santa Lighter, RN Outcome: Progressing Goal: Respiratory complications will improve Outcome: Adequate for Discharge Goal: Cardiovascular complication will be avoided Outcome: Adequate for Discharge   Problem: Activity: Goal: Risk for activity intolerance will decrease Outcome: Adequate for Discharge   Problem:  Nutrition: Goal: Adequate nutrition will be maintained Outcome: Adequate for Discharge   Problem: Coping: Goal: Level of anxiety will decrease Outcome: Adequate for Discharge   Problem: Elimination: Goal: Will not experience complications related to bowel motility Outcome: Adequate for Discharge Goal: Will not experience complications related to urinary retention Outcome: Adequate for Discharge   Problem: Pain Managment: Goal: General experience of comfort will improve Outcome: Adequate for Discharge   Problem: Safety: Goal: Ability to remain free from injury will improve Outcome: Adequate for Discharge   Problem: Skin Integrity: Goal: Risk for impaired skin integrity will decrease Outcome: Adequate for Discharge

## 2020-04-25 NOTE — Progress Notes (Signed)
Report called to [facility], spoke with Eugenio Hoes, RN.

## 2020-04-25 NOTE — Discharge Summary (Signed)
Tula Schryver, is a 69 y.o. female  DOB October 08, 1951  MRN [id number].  Admission date:  04/22/2020  Admitting Physician  Mauricio Gerome Apley, MD  Discharge Date:  04/25/2020   Primary MD  Zhou-Talbert, Elwyn Lade, MD  Recommendations for primary care physician for things to follow:   1)Avoid ibuprofen/Advil/Aleve/Motrin/Goody Powders/Naproxen/BC powders/Meloxicam/Diclofenac/Indomethacin and other Nonsteroidal anti-inflammatory medications as these will make you more likely to bleed and can cause stomach ulcers, can also cause Kidney problems.   2)Very low-salt diet advised 3)Weigh yourself daily, call if you gain more than 3 pounds in 1 day or more than 5 pounds in 1 week as your diuretic medications may need to be adjusted 4)Limit your Fluid  intake to no more than 50 ounces (1.5 Liters) per day 5)Repeat BMP every Monday starting 04/29/20  Admission Diagnosis  Heart failure ([place]) [I50.9] Congestive heart failure, unspecified HF chronicity, unspecified heart failure type ([place]) [I50.9]   Discharge Diagnosis  Heart failure ([place]) [I50.9] Congestive heart failure, unspecified HF chronicity, unspecified heart failure type ([place]) [I50.9]    Principal Problem:   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 Brain aneurysm   . Diabetes mellitus without complication ([place])   . Hallucinations    visual and auditory  . Memory loss   . Recurrent UTI   . Renal disorder   . Stage 5 chronic kidney disease ([place])   . Stroke [facility])     Past  Surgical History:  Procedure Laterality Date  . CHOLECYSTECTOMY    . SKIN GRAFT       HPI  from the history and physical done on the day of admission:    Patient coming from: Home   Chief Complaint: dyspnea and lower extremity edema.   HPI: Lylliana Kitamura is a 69 y.o. female with medical history significant of diastolic heart failure, type 2 diabetes mellitus, stage V chronic kidney disease, cognitive impairment, cerebellar aneurysm and history of CVA.  Recent hospitalization 5/12-5/13 for decompensated diastolic heart failure, she was discharged in a stable condition, instructions to take furosemide 40 mg daily.  Apparently her sister is in charge of her medications and meals. Yesterday evening she had a acute recurrence of dyspnea, moderate to severe in intensity, persistent in nature, with no improving or worsening factors, associated with PND, orthopnea and lower extremity edema.  Patient unable to recall any recent out of the ordinary event, unable to remember her recent meal.  Per her report her sister has been trying to use low-salt while cooking.   ED Course: Patient was found volume overloaded and hypertensive, she received furosemide 40 mg intravenously and 25  mg of oral hydralazine.   Her d dimer was elevated and she underwent a VQ scan, which resulted with positive matching ventilation, perfusion and radiographic abnormalities in the lower lobes more left than right, findings consistent with intermediate probability for pulmonary embolism.   Hospital Course:   Brief Summary:- 69 y.o.femalewith medical history significant ofdiastolic heart failure, type 2 diabetes mellitus, stage V chronic kidney disease, cognitive impairment,cerebellar aneurysm and history of CVA admitted on 04/22/2020 with acute on chronic diastolic dysfunction exacerbation   A/p 1)HFpEF-acute on chronic diastolic CHF exacerbation in a patient with grade 2 diastolic dysfunction and EF of 65 to 7 %   -Treated with  IV diuresis with Lasix,  -Weight is down to 216 pounds from 221 pounds couple days ago -Clinically much improved, no orthopnea, and hypoxia resolved -PTA patient was on Lasix 40 mg daily with increased to Lasix 40 mg twice daily -Outpatient follow-up with cardiologist advised  2)Acute Hypoxic Respiratory Failure--most likely secondary to #1 above, on admission D-dimer was elevated, VQ scan was indeterminate/intermediate probability--- clinically low index of suspicion for PE -Hypoxia- has Resolved -Lower extremity venous Dopplers negative for DVT -Unable to do CTA chest to rule out PE due to CKD 4 with GFR around 20 --Discussed risk versus benefit of anticoagulation with patient sister/POA Ms. Willeen Cass--- patient has a history of brain aneurysm and stroke--sister is concerned about [place] if patient is fully anticoagulated -Patient has also chronic anemia with hemoglobin around 9 -Given high bleeding risk including concerns about ICH should patient be fully anticoagulated , and given lack of definitive/conclusive evidence of VTE/DVT/PE--- patient sister isolated to forego Full anticoagulation at this time -Patient's hypoxia resolved with IV diuresis   3)CKD IV--creatinine currently 2.4 which is around patient's recent baseline -Repeat BMP every Monday for the next couple of weeks while diuresing patient -Outpatient follow-up with nephrologist advised  4)Chronic Anemia--- suspect due to underlying CKD, Hgb usually around 9 hemoglobin currently 8.7 continue to monitor closely -No evidence of bleeding, -May benefit from ESA/Procrit -- 5)DM2--A1c is 8.4, reflecting uncontrolled DM, resume Tradjenta on home insulin regimen  6)Adrenal  Insufficiency--- continue fludrocortisone and midodrine    7) depressive disorder--stable, continue Seroquel and Lexapro  8) cognitive impairment--- history of prior aneurysm,  history of prior stroke, patient sister Ms Willeen Cass  helps her make decisions  9) generalized weakness/deconditioning/ambulatory dysfunction----please see nursing documentation from RN Santa Lighter time 11:41 AM on 04/24/2020 -Please see PT eval, SNF rehab recommended -Patient and family have agreeable  Disposition/--discharge to SNF rehab-   Code Status : DNR  Family Communication:  (patient is alert, awake and coherent) -Discussed with patient's sister/POA Ms. Willeen Cass  Consults  :  na  Discharge Condition: stable  Follow UP   Contact information for follow-up providers    Ahmed Prima, Fransisco Hertz, PA-C. Schedule an appointment as soon as possible for a visit in 4 week(s).   Specialties: Physician Assistant, Cardiology Contact information: [place] [address] [phone]        Arnoldo Lenis, MD. Schedule an appointment as soon as possible for a visit in 4 week(s).   Specialty: Cardiology Contact information: [address] [place] [address] [phone]            Contact information for after-discharge care    [facility] .   Service: Skilled Chiropodist information: [address] [place] [phone]  Diet and Activity recommendation:  As advised  Discharge Instructions    Discharge Instructions    Call MD for:  difficulty breathing, headache or visual disturbances   Complete by: As directed    Call MD for:  extreme fatigue   Complete by: As directed    Call MD for:  persistant dizziness or light-headedness   Complete by: As directed    Call MD for:  persistant nausea and vomiting   Complete by: As directed    Call MD for:  temperature >100.4   Complete by: As directed    Diet - low sodium heart healthy   Complete by: As directed    Discharge instructions   Complete by: As directed    1)Avoid ibuprofen/Advil/Aleve/Motrin/Goody Powders/Naproxen/BC  powders/Meloxicam/Diclofenac/Indomethacin and other Nonsteroidal anti-inflammatory medications as these will make you more likely to bleed and can cause stomach ulcers, can also cause Kidney problems.   2)Very low-salt diet advised 3)Weigh yourself daily, call if you gain more than 3 pounds in 1 day or more than 5 pounds in 1 week as your diuretic medications may need to be adjusted 4)Limit your Fluid  intake to no more than 50 ounces (1.5 Liters) per day 5)Repeat BMP every Monday starting 04/29/20   Increase activity slowly   Complete by: As directed       Discharge Medications     Allergies as of 04/25/2020      Reactions   Penicillins Anaphylaxis   Did it involve swelling of the face/tongue/throat, SOB, or low BP? Yes Did it involve sudden or severe rash/hives, skin peeling, or any reaction on the inside of your mouth or nose? Unknown Did you need to seek medical attention at a hospital or doctor's office? Unknown When did it last happen?Within past 10 years If all above answers are "NO", may proceed with cephalosporin use.   Chicken Allergy    Contrast Media [iodinated Diagnostic Agents]    Eggs Or Egg-derived Products Swelling   Patient is allergic to egg yolk, but can eat egg white with no problems   Milk-related Compounds    Nickel    Oatmeal    Other    Black beans, cats, dogs, grass clippings   Wheat Bran       Medication List    STOP taking these medications   doxycycline 100 MG tablet Commonly known as: VIBRA-TABS   isosorbide mononitrate 30 MG 24 hr tablet Commonly known as: IMDUR   loratadine 10 MG tablet Commonly known as: CLARITIN   traMADol 50 MG tablet Commonly known as: ULTRAM     TAKE these medications   aspirin EC 81 MG tablet Take 1 tablet (81 mg total) by mouth daily with breakfast. What changed: when to take this   atorvastatin 20 MG tablet Commonly known as: LIPITOR Take 20 mg by mouth daily.   cetirizine 5 MG tablet Commonly known  as: ZYRTEC Take 5 mg by mouth daily.   escitalopram 10 MG tablet Commonly known as: LEXAPRO Take 10 mg by mouth every morning.   fludrocortisone 0.1 MG tablet Commonly known as: FLORINEF Take 2 tablets by mouth daily.   furosemide 40 MG tablet Commonly known as: LASIX Take 1 tablet (40 mg total) by mouth 2 (two) times daily. What changed: when to take this   hydrALAZINE 25 MG tablet Commonly known as: APRESOLINE Take 1 tablet (25 mg total) by mouth 3 (three) times daily.   insulin glargine 100 UNIT/ML injection Commonly known as: LANTUS  Inject 0.1 mLs (10 Units total) into the skin at bedtime. What changed: how much to take   insulin lispro 100 UNIT/ML injection Commonly known as: HUMALOG Inject 1-17 Units into the skin 3 (three) times daily before meals. Sliding scale starting at; 180-220= 4 units 221-260= 6 units 261-300= 8 units 301-340=10units 341-380=12units   linagliptin 5 MG Tabs tablet Commonly known as: TRADJENTA Take 5 mg by mouth every morning.   melatonin 1 MG Tabs tablet Take 5 mg by mouth at bedtime.   metoprolol succinate 100 MG 24 hr tablet Commonly known as: TOPROL-XL Take 100 mg by mouth every morning.   midodrine 10 MG tablet Commonly known as: PROAMATINE Take 1 tablet (10 mg total) by mouth 3 (three) times daily with meals. What changed:   medication strength  when to take this   multivitamin with minerals Tabs tablet Take 1 tablet by mouth daily.   pregabalin 75 MG capsule Commonly known as: LYRICA Take 75 mg by mouth 2 (two) times daily.   QUEtiapine 25 MG tablet Commonly known as: SEROQUEL Take 1 tablet (25 mg total) by mouth at bedtime.      Major procedures and Radiology Reports - PLEASE review detailed and final reports for all details, in brief -   DG Chest 2 View  Result Date: 04/22/2020 CLINICAL DATA:  Shortness of breath, chest pain. Additional history provided: Patient presents with chest pain and shortness of  breath since yesterday, denies fever, history of diabetes and CKD, COVID negative Apr 17, 2020 EXAM: CHEST - 2 VIEW COMPARISON:  Prior chest radiograph 04/17/2020 FINDINGS: Unchanged cardiomegaly. Central pulmonary vascular congestion. Prominence of the interstitial lung markings bilaterally. Small bilateral pleural effusions. No evidence of pneumothorax. No acute bony abnormality is identified. IMPRESSION: Unchanged cardiomegaly with central pulmonary vascular congestion. Bilateral interstitial prominence likely reflects interstitial edema. Atypical/viral pneumonia cannot be excluded. Small bilateral pleural effusions. Electronically Signed   By: Kellie Simmering DO   On: 04/22/2020 10:19   CT Head Wo Contrast  Result Date: 04/17/2020 CLINICAL DATA:  Ataxia, stroke suspected. Additional history provided: Increased confusion, weakness since 11 p.m. last night, history of stroke, short-term memory loss at baseline EXAM: CT HEAD WITHOUT CONTRAST TECHNIQUE: Contiguous axial images were obtained from the base of the skull through the vertex without intravenous contrast. COMPARISON:  Brain MRI 07/21/2019, head CT 07/20/2019. FINDINGS: Brain: Redemonstrated chronic lacunar infarcts within the cerebral white matter and basal ganglia bilaterally. A known chronic infarct within the paramedian right pons was better appreciated on prior MRI 07/21/2019. Stable, mild generalized parenchymal atrophy. There is no acute intracranial hemorrhage. No demarcated cortical infarct. No extra-axial fluid collection. No evidence of intracranial mass. No midline shift. Partially empty sella turcica. Vascular: No hyperdense vessel.  Atherosclerotic calcifications. Skull: Normal. Negative for fracture or focal lesion. Sinuses/Orbits: Visualized orbits show no acute finding. Minimal scattered paranasal sinus mucosal thickening. No significant mastoid effusion at the imaged levels. IMPRESSION: 1. No CT evidence of acute intracranial abnormality.  2. Redemonstrated chronic lacunar infarcts within the bilateral cerebral white matter and basal ganglia. 3. A known chronic infarct within the paramedian right pons was better appreciated on prior MRI 07/21/2019. 4. Minimal scattered paranasal sinus mucosal thickening. Electronically Signed   By: Kellie Simmering DO   On: 04/17/2020 11:23   MR BRAIN WO CONTRAST  Result Date: 04/17/2020 CLINICAL DATA:  Encephalopathy EXAM: MRI HEAD WITHOUT CONTRAST TECHNIQUE: Multiplanar, multiecho pulse sequences of the brain and surrounding structures were obtained without intravenous contrast.  COMPARISON:  07/21/2019 FINDINGS: Brain: There is no acute infarction or intracranial hemorrhage. There is no intracranial mass, mass effect, or edema. There is no hydrocephalus or extra-axial fluid collection. There are multiple chronic small vessel infarcts including involvement of bilateral basal ganglia and adjacent white matter as well as the right pons. There is hemosiderin deposition associated with the left basal ganglia infarct. Additional patchy T2 hyperintensity in the supratentorial white matter is nonspecific but may reflect mild chronic microvascular ischemic changes. Ventricles and sulci are within normal limits in size and configuration. Vascular: Major vessel flow voids at the skull base are preserved. Skull and upper cervical spine: Normal marrow signal is preserved. Sinuses/Orbits: Trace mucosal thickening. Bilateral lens replacements. Other: Incidental note is made of a partially empty sella. Trace mastoid fluid opacification. IMPRESSION: No evidence of recent infarction, hemorrhage, or mass. Stable chronic findings detailed above. Electronically Signed   By: Macy Mis M.D.   On: 04/17/2020 14:14   NM PULMONARY VENT AND PERF (V/Q Scan)  Result Date: 04/22/2020 CLINICAL DATA:  Shortness of breath and chest pain for 1 day EXAM: NUCLEAR MEDICINE VENTILATION - PERFUSION LUNG SCAN TECHNIQUE: Ventilation images were  obtained in multiple projections using inhaled aerosol Tc-42m DTPA. Perfusion images were obtained in multiple projections after intravenous injection of Tc-32m MAA. RADIOPHARMACEUTICALS:  44 mCi of Tc-51m DTPA aerosol inhalation and 1.53 mCi Tc46m MAA IV COMPARISON:  None Correlation: Chest radiograph 04/22/2020 FINDINGS: Ventilation: Decreased ventilation LEFT lower lobe. Subsegmental diminished ventilation posterior RIGHT lower lobe. Remaining ventilation normal. Perfusion: Matching area of diminished perfusion in LEFT lower lobe and smaller matching subsegmental defect in posterior RIGHT lower lobe. Remaining perfusion normal. Chest radiograph: Enlargement of cardiac silhouette with BILATERAL pleural effusions and pulmonary edema, question more prominent in LEFT lower lobe. IMPRESSION: Matching ventilation, perfusion and radiographic abnormalities in the lower lobes bilaterally LEFT greater than RIGHT. Findings represent an intermediate probability for pulmonary embolism. Electronically Signed   By: Lavonia Dana M.D.   On: 04/22/2020 18:06   US Venous Img Lower Bilateral (DVT)  Result Date: 04/23/2020 CLINICAL DATA:  Lower extremity edema. History of previous DVT. Evaluate for acute or chronic DVT. EXAM: BILATERAL LOWER EXTREMITY VENOUS DOPPLER ULTRASOUND TECHNIQUE: Gray-scale sonography with graded compression, as well as color Doppler and duplex ultrasound were performed to evaluate the lower extremity deep venous systems from the level of the common femoral vein and including the common femoral, femoral, profunda femoral, popliteal and calf veins including the posterior tibial, peroneal and gastrocnemius veins when visible. The superficial great saphenous vein was also interrogated. Spectral Doppler was utilized to evaluate flow at rest and with distal augmentation maneuvers in the common femoral, femoral and popliteal veins. COMPARISON:  None. FINDINGS: RIGHT LOWER EXTREMITY Common Femoral Vein: No  evidence of thrombus. Normal compressibility, respiratory phasicity and response to augmentation. Saphenofemoral Junction: No evidence of thrombus. Normal compressibility and flow on color Doppler imaging. Profunda Femoral Vein: No evidence of thrombus. Normal compressibility and flow on color Doppler imaging. Femoral Vein: No evidence of thrombus. Normal compressibility, respiratory phasicity and response to augmentation. Popliteal Vein: No evidence of thrombus. Normal compressibility, respiratory phasicity and response to augmentation. Calf Veins: No evidence of thrombus. Normal compressibility and flow on color Doppler imaging. Superficial Great Saphenous Vein: No evidence of thrombus. Normal compressibility. Venous Reflux:  None. Other Findings:  None. LEFT LOWER EXTREMITY Common Femoral Vein: No evidence of thrombus. Normal compressibility, respiratory phasicity and response to augmentation. Saphenofemoral Junction: No evidence of thrombus. Normal  compressibility and flow on color Doppler imaging. Profunda Femoral Vein: No evidence of thrombus. Normal compressibility and flow on color Doppler imaging. Femoral Vein: No evidence of thrombus. Normal compressibility, respiratory phasicity and response to augmentation. Popliteal Vein: No evidence of thrombus. Normal compressibility, respiratory phasicity and response to augmentation. Calf Veins: No evidence of thrombus. Normal compressibility and flow on color Doppler imaging. Superficial Great Saphenous Vein: No evidence of thrombus. Normal compressibility. Venous Reflux:  None. Other Findings:  None. IMPRESSION: No evidence of acute or chronic DVT within either lower extremity. Electronically Signed   By: Sandi Mariscal M.D.   On: 04/23/2020 12:32   DG Chest Portable 1 View  Result Date: 04/17/2020 CLINICAL DATA:  Shortness of breath. Additional provided: Wheezing, increased confusion, weakness since 11 p.m. last night, pending COVID test. EXAM: PORTABLE CHEST 1  VIEW COMPARISON:  Chest radiograph 09/05/2019 FINDINGS: Unchanged cardiomegaly. Central pulmonary vascular congestion. Ill-defined opacities at the bilateral lung bases. No definite pleural effusion or evidence of pneumothorax. 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 IMPRESSION: Unchanged cardiomegaly.  Central pulmonary vascular congestion. Ill-defined opacities at the bilateral lung bases may reflect edema, atelectasis or pneumonia. Electronically Signed   By: Kellie Simmering DO   On: 04/17/2020 11:12   ECHOCARDIOGRAM COMPLETE  Result Date: 04/18/2020    ECHOCARDIOGRAM REPORT   Patient Name:   GARYN WAGUESPACK Date of Exam: 04/18/2020 Medical Rec #:  [id number]  Height:       61.0 in Accession #:    [id number] Weight:       220.0 lb Date of Birth:  [birth year] BSA:          1.967 m Patient Age:    56 years   BP:           159/64 mmHg Patient Gender: F          HR:           72 bpm. Exam Location:  [facility] Procedure: 2D Echo, Cardiac Doppler and Color Doppler Indications:    I50.23 Acute on chronic systolic (congestive) heart failure  History:        Patient has no prior history of Echocardiogram examinations.                 Stroke; Risk Factors:Diabetes. CKD.  Sonographer:    Jonelle Sidle Dance Referring Phys: [place]  1. Left ventricular ejection fraction, by estimation, is 65 to 70%. The left ventricle has normal function. The left ventricle has no regional wall motion abnormalities. There is mild concentric left ventricular hypertrophy. Left ventricular diastolic parameters are consistent with Grade II diastolic dysfunction (pseudonormalization). Elevated left ventricular end-diastolic pressure.  2. Right ventricular systolic function is normal. The right ventricular size is normal. Tricuspid regurgitation signal is inadequate for assessing PA pressure.  3. Left atrial size was moderately dilated.  4. The mitral valve is grossly  normal. Mild mitral valve regurgitation.  5. The aortic valve has an indeterminant number of cusps. Aortic valve regurgitation is not visualized.  6. The inferior vena cava is dilated in size with >50% respiratory variability, suggesting right atrial pressure of 8 mmHg.  7. A small pericardial effusion is present. The pericardial effusion is circumferential. There is no evidence of cardiac tamponade. FINDINGS  Left Ventricle: Left ventricular ejection fraction, by estimation, is 65 to 70%. The left ventricle has normal function. The left ventricle has no regional wall motion abnormalities. The left ventricular internal  cavity size was normal in size. There is  mild concentric left ventricular hypertrophy. Left ventricular diastolic parameters are consistent with Grade II diastolic dysfunction (pseudonormalization). Elevated left ventricular end-diastolic pressure. Right Ventricle: The right ventricular size is normal. No increase in right ventricular wall thickness. Right ventricular systolic function is normal. Tricuspid regurgitation signal is inadequate for assessing PA pressure. Left Atrium: Left atrial size was moderately dilated. Right Atrium: Right atrial size was normal in size. Pericardium: A small pericardial effusion is present. The pericardial effusion is circumferential. There is no evidence of cardiac tamponade. Mitral Valve: The mitral valve is grossly normal. Mild mitral annular calcification. Mild mitral valve regurgitation. Tricuspid Valve: The tricuspid valve is grossly normal. Tricuspid valve regurgitation is mild. Aortic Valve: The aortic valve has an indeterminant number of cusps. Aortic valve regurgitation is not visualized. Mild aortic valve annular calcification. There is mild calcification of the aortic valve. Pulmonic Valve: The pulmonic valve was not well visualized. Pulmonic valve regurgitation is not visualized. Aorta: The aortic root is normal in size and structure. Venous: The  inferior vena cava is dilated in size with greater than 50% respiratory variability, suggesting right atrial pressure of 8 mmHg. IAS/Shunts: No atrial level shunt detected by color flow Doppler.  LEFT VENTRICLE PLAX 2D LVIDd:         4.58 cm  Diastology LVIDs:         3.69 cm  LV e' lateral:   4.57 cm/s LV PW:         1.30 cm  LV E/e' lateral: 35.2 LV IVS:        1.13 cm  LV e' medial:    3.92 cm/s LVOT diam:     2.00 cm  LV E/e' medial:  41.1 LV SV:         90 LV SV Index:   46 LVOT Area:     3.14 cm  RIGHT VENTRICLE RV Basal diam:  2.79 cm RV S prime:     13.50 cm/s TAPSE (M-mode): 2.4 cm LEFT ATRIUM              Index       RIGHT ATRIUM           Index LA diam:        4.60 cm  2.34 cm/m  RA Area:     16.00 cm LA Vol (A2C):   115.0 ml 58.47 ml/m RA Volume:   38.30 ml  19.47 ml/m LA Vol (A4C):   75.0 ml  38.13 ml/m LA Biplane Vol: 95.2 ml  48.40 ml/m  AORTIC VALVE LVOT Vmax:   100.00 cm/s LVOT Vmean:  69.900 cm/s LVOT VTI:    0.285 m  AORTA Ao Root diam: 3.20 cm Ao Asc diam:  3.60 cm MITRAL VALVE MV Area (PHT): 3.54 cm     SHUNTS MV Decel Time: 214 msec     Systemic VTI:  0.29 m MV E velocity: 161.00 cm/s  Systemic Diam: 2.00 cm MV A velocity: 149.00 cm/s MV E/A ratio:  1.08 Kate Sable MD Electronically signed by Kate Sable MD Signature Date/Time: 04/18/2020/10:06:32 AM    Final    Micro Results   Recent Results (from the past 240 hour(s))  SARS Coronavirus 2 by RT PCR (hospital order, performed in [facility] hospital lab) Nasopharyngeal Nasopharyngeal Swab     Status: None   Collection Time: 04/17/20  1:26 PM   Specimen: Nasopharyngeal Swab  Result Value Ref Range Status   SARS  Coronavirus 2 NEGATIVE NEGATIVE Final    Comment: (NOTE) SARS-CoV-2 target nucleic acids are NOT DETECTED. The SARS-CoV-2 RNA is generally detectable in upper and lower respiratory specimens during the acute phase of infection. The lowest concentration of SARS-CoV-2 viral copies this assay can detect is  250 copies / mL. A negative result does not preclude SARS-CoV-2 infection and should not be used as the sole basis for treatment or other patient management decisions.  A negative result may occur with improper specimen collection / handling, submission of specimen other than nasopharyngeal swab, presence of viral mutation(s) within the areas targeted by this assay, and inadequate number of viral copies (<250 copies / mL). A negative result must be combined with clinical observations, patient history, and epidemiological information. Fact Sheet for Patients:   StrictlyIdeas.no Fact Sheet for Healthcare Providers: BankingDealers.co.za This test is not yet approved or cleared  by the [place] FDA and has been authorized for detection and/or diagnosis of SARS-CoV-2 by FDA under an Emergency Use Authorization (EUA).  This EUA will remain in effect (meaning this test can be used) for the duration of the COVID-19 declaration under Section 564(b)(1) of the Act, 21 U.S.C. section 360bbb-3(b)(1), unless the authorization is terminated or revoked sooner. Performed at [facility], [address], [place], [place] [address]   Urine culture     Status: None   Collection Time: 04/17/20  3:19 PM   Specimen: Urine, Clean Catch  Result Value Ref Range Status   Specimen Description   Final    URINE, CLEAN CATCH Performed at [facility], [address] [place], [place] [address]    Special Requests   Final    NONE Performed at [facility], [address] [place], [place] [address]    Culture   Final    NO GROWTH Performed at [facility], [place] [address], [place], [place] [address]    Report Status 04/18/2020 FINAL  Final  SARS Coronavirus 2 by RT PCR (hospital order, performed in [facility] hospital lab) Nasopharyngeal Nasopharyngeal Swab     Status: None   Collection Time: 04/22/20 10:49 AM   Specimen: Nasopharyngeal Swab  Result  Value Ref Range Status   SARS Coronavirus 2 NEGATIVE NEGATIVE Final    Comment: (NOTE) SARS-CoV-2 target nucleic acids are NOT DETECTED. The SARS-CoV-2 RNA is generally detectable in upper and lower respiratory specimens during the acute phase of infection. The lowest concentration of SARS-CoV-2 viral copies this assay can detect is 250 copies / mL. A negative result does not preclude SARS-CoV-2 infection and should not be used as the sole basis for treatment or other patient management decisions.  A negative result may occur with improper specimen collection / handling, submission of specimen other than nasopharyngeal swab, presence of viral mutation(s) within the areas targeted by this assay, and inadequate number of viral copies (<250 copies / mL). A negative result must be combined with clinical observations, patient history, and epidemiological information. Fact Sheet for Patients:   StrictlyIdeas.no Fact Sheet for Healthcare Providers: BankingDealers.co.za This test is not yet approved or cleared  by the [place] FDA and has been authorized for detection and/or diagnosis of SARS-CoV-2 by FDA under an Emergency Use Authorization (EUA).  This EUA will remain in effect (meaning this test can be used) for the duration of the COVID-19 declaration under Section 564(b)(1) of the Act, 21 U.S.C. section 360bbb-3(b)(1), unless the authorization is terminated or revoked sooner. Performed at [facility], [place]  [address], [place], [place] [address]     Today   Subjective    Neena Beecham today has no new complaints -Breathing much better, no further orthopnea -O2 sats mid 90s on room air          Patient has been seen and examined prior to discharge   Objective   Blood pressure (!) 126/46, pulse 69, temperature 98 F (36.7 C), temperature source Oral, resp. rate 16, height 5\' 1"  (1.549 m), weight 98.3 kg, SpO2 94  %.   Intake/Output Summary (Last 24 hours) at 04/25/2020 1530 Last data filed at 04/25/2020 4562 Gross per 24 hour  Intake 480 ml  Output 2700 ml  Net -2220 ml    Exam Gen:- Awake Alert, morbidly obese, HEENT:- .AT, No sclera icterus Nose-  2 L/min Neck-Supple Neck +ve JVD,.  Lungs-improved air movement,, no wheezing, faint bibasilar rales CV- S1, S2 normal, regular  Abd-  +ve B.Sounds, Abd Soft, No tenderness,    Extremity/Skin:- trace +  edema, pedal pulses present  Psych-affect is appropriate, some baseline memory and cognitive deficits since stroke Neuro-generalized weakness, no new focal deficits, no tremors   Data Review   CBC w Diff:  Lab Results  Component Value Date   WBC 12.6 (H) 04/22/2020   HGB 8.7 (L) 04/22/2020   HCT 27.7 (L) 04/22/2020   PLT 209 04/22/2020   LYMPHOPCT 30 04/22/2020   MONOPCT 6 04/22/2020   EOSPCT 10 04/22/2020   BASOPCT 1 04/22/2020    CMP:  Lab Results  Component Value Date   NA 144 04/25/2020   K 4.0 04/25/2020   CL 103 04/25/2020   CO2 31 04/25/2020   BUN 52 (H) 04/25/2020   CREATININE 2.49 (H) 04/25/2020   PROT 6.2 (L) 04/22/2020   ALBUMIN 3.1 (L) 04/22/2020   BILITOT 1.0 04/22/2020   ALKPHOS 85 04/22/2020   AST 20 04/22/2020   ALT 14 04/22/2020  .   Total Discharge time is about 33 minutes  Roxan Hockey M.D on 04/25/2020 at 3:30 PM  Go to [email or web address] -  for contact info  [facility] - Office  [phone]

## 2020-04-25 NOTE — Progress Notes (Addendum)
Inpatient Diabetes Program Recommendations  AACE/ADA: New Consensus Statement on Inpatient Glycemic Control (2015)  Target Ranges:  Prepandial:   less than 140 mg/dL      Peak postprandial:   less than 180 mg/dL (1-2 hours)      Critically ill patients:  140 - 180 mg/dL   Lab Results  Component Value Date   GLUCAP 227 (H) 04/25/2020   HGBA1C 8.4 (H) 04/17/2020    Review of Glycemic Control Results for Victoria Winters, Victoria Winters (MRN [id number]) as of 04/25/2020 09:28  Ref. Range 04/24/2020 07:25 04/24/2020 11:19 04/24/2020 16:30 04/24/2020 21:19 04/25/2020 07:24  Glucose-Capillary Latest Ref Range: 70 - 99 mg/dL 164 (H) 310 (H) 268 (H) 267 (H) 227 (H)   Diabetes history:DM2 Outpatient Diabetes medications:Lantus 27 units QHS, Humalog 1-17 units TID with meals, Tradjenta 5 mg daily Current orders for Inpatient glycemic control:Novolog 0-6 units TID with meals  Inpatient Diabetes Program Recommendations:  -Lantus 10 units daily -Novolog 3 units tid meal coverage if eats 50% Secure chat to Dr. Maurene Capes.  Thank you, Victoria Winters. Victoria Alleyne, RN, MSN, CDE  Diabetes Coordinator Inpatient Glycemic Control Team Team Pager [phone] (8am-5pm) 04/25/2020 9:29 AM

## 2020-04-25 NOTE — Discharge Instructions (Signed)
1)Avoid ibuprofen/Advil/Aleve/Motrin/Goody Powders/Naproxen/BC powders/Meloxicam/Diclofenac/Indomethacin and other Nonsteroidal anti-inflammatory medications as these will make you more likely to bleed and can cause stomach ulcers, can also cause Kidney problems.   2)Very low-salt diet advised 3)Weigh yourself daily, call if you gain more than 3 pounds in 1 day or more than 5 pounds in 1 week as your diuretic medications may need to be adjusted 4)Limit your Fluid  intake to no more than 50 ounces (1.5 Liters) per day 5)Repeat BMP every Monday starting 04/29/20

## 2020-05-28 ENCOUNTER — Telehealth: Payer: Self-pay | Admitting: Cardiology

## 2020-05-28 NOTE — Telephone Encounter (Signed)
Patient's sister called and stated that the patient's feet are swollen and it's getting harder for her to breath. They want to know if there is a diuretic that she could take to help with the swelling until her appt on 6/25 with Dr. Harl Bowie. You can call Willeen Cass (Sister) [phone]

## 2020-05-30 ENCOUNTER — Ambulatory Visit: Payer: 59 | Admitting: Cardiology

## 2020-05-31 ENCOUNTER — Encounter: Payer: Self-pay | Admitting: Cardiology

## 2020-05-31 ENCOUNTER — Encounter: Payer: Self-pay | Admitting: *Deleted

## 2020-05-31 ENCOUNTER — Ambulatory Visit (INDEPENDENT_AMBULATORY_CARE_PROVIDER_SITE_OTHER): Payer: 59 | Admitting: Cardiology

## 2020-05-31 ENCOUNTER — Other Ambulatory Visit: Payer: Self-pay | Admitting: *Deleted

## 2020-05-31 ENCOUNTER — Other Ambulatory Visit: Payer: Self-pay

## 2020-05-31 VITALS — BP 142/68 | HR 89 | Ht 61.0 in | Wt 226.0 lb

## 2020-05-31 DIAGNOSIS — Z79899 Other long term (current) drug therapy: Secondary | ICD-10-CM | POA: Diagnosis not present

## 2020-05-31 DIAGNOSIS — I5033 Acute on chronic diastolic (congestive) heart failure: Secondary | ICD-10-CM | POA: Diagnosis not present

## 2020-05-31 MED ORDER — TORSEMIDE 20 MG PO TABS
40.0000 mg | ORAL_TABLET | Freq: Two times a day (BID) | ORAL | 3 refills | Status: DC
Start: 2020-05-31 — End: 2020-06-07

## 2020-05-31 NOTE — Progress Notes (Signed)
Clinical Summary Victoria Winters is a 69 y.o.female seen today as a new Victoria for the following medical problems.   1. Chronic diastolic HF  - admission 08/3789 with HF - discharge weight 216 lbs, discharged on lasix 40mg  bid - home scale at least 220 lbs.  - recent increased LE edema. +orthopnea  2. History of CVA   3. 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 Adrenal insufficiency - per chart, family is unaware.  - she is on midodrine and florinef.   5. History of brain aneurysm    Past Medical History:  Diagnosis Date  . Brain aneurysm   . Diabetes mellitus without complication ([place])   . Hallucinations    visual and auditory  . Memory loss   . Recurrent UTI   . Renal disorder   . Stage 5 chronic kidney disease ([place])   . Stroke [facility])      Allergies  Allergen Reactions  . Penicillins Anaphylaxis    Did it involve swelling of the face/tongue/throat, SOB, or low BP? Yes Did it involve sudden or severe rash/hives, skin peeling, or any reaction on the inside of your mouth or nose? Unknown Did you need to seek medical attention at a hospital or doctor's office? Unknown When did it last happen?Within past 10 years If all above answers are "NO", may proceed with cephalosporin use.   . Chicken Allergy   . Contrast Media [Iodinated Diagnostic Agents]   . Eggs Or Egg-Derived Products Swelling    Victoria Winters, but can eat egg white with no problems  . Milk-Related Compounds   . Nickel   . Oatmeal   . Other     Black beans, cats, dogs, grass clippings  . Wheat Bran      Current Outpatient Medications  Medication Sig Dispense Refill  . aspirin EC 81 MG tablet Take 1 tablet (81 mg total) by mouth daily with breakfast. 30 tablet 3  . atorvastatin (LIPITOR) 20 MG tablet Take 20 mg by mouth daily.    . cetirizine (ZYRTEC) 5 MG tablet Take 5 mg by mouth daily.    Marland Kitchen escitalopram (LEXAPRO) 10 MG tablet Take 10 mg by mouth every morning.    .  fludrocortisone (FLORINEF) 0.1 MG tablet Take 2 tablets by mouth daily.    . furosemide (LASIX) 40 MG tablet Take 1 tablet (40 mg total) by mouth 2 (two) times daily. 60 tablet 2  . hydrALAZINE (APRESOLINE) 25 MG tablet Take 1 tablet (25 mg total) by mouth 3 (three) times daily. 90 tablet 3  . insulin glargine (LANTUS) 100 UNIT/ML injection Inject 0.1 mLs (10 Units total) into the skin at bedtime. (Victoria taking differently: Inject 27 Units into the skin at bedtime. ) 10 mL 11  . insulin lispro (HUMALOG) 100 UNIT/ML injection Inject 1-17 Units into the skin 3 (three) times daily before meals. Sliding scale starting at; 180-220= 4 units 221-260= 6 units 261-300= 8 units 301-340=10units 341-380=12units    . linagliptin (TRADJENTA) 5 MG TABS tablet Take 5 mg by mouth every morning.    . Melatonin 1 MG TABS Take 5 mg by mouth at bedtime.     . metoprolol succinate (TOPROL-XL) 100 MG 24 hr tablet Take 100 mg by mouth every morning.     . midodrine (PROAMATINE) 10 MG tablet Take 1 tablet (10 mg total) by mouth 3 (three) times daily with meals. 90 tablet 3  . Multiple Vitamin (  MULTIVITAMIN WITH MINERALS) TABS tablet Take 1 tablet by mouth daily.    . pregabalin (LYRICA) 75 MG capsule Take 75 mg by mouth 2 (two) times daily.     . QUEtiapine (SEROQUEL) 25 MG tablet Take 1 tablet (25 mg total) by mouth at bedtime.     No current facility-administered medications for this visit.     Past Surgical History:  Procedure Laterality Date  . CHOLECYSTECTOMY    . SKIN GRAFT       Allergies  Allergen Reactions  . Penicillins Anaphylaxis    Did it involve swelling of the face/tongue/throat, SOB, or low BP? Yes Did it involve sudden or severe rash/hives, skin peeling, or any reaction on the inside of your mouth or nose? Unknown Did you need to seek medical attention at a hospital or doctor's office? Unknown When did it last happen?Within past 10 years If all above answers are "NO", may proceed with  cephalosporin use.   . Chicken Allergy   . Contrast Media [Iodinated Diagnostic Agents]   . Eggs Or Egg-Derived Products Swelling    Victoria Winters, but can eat egg white with no problems  . Milk-Related Compounds   . Nickel   . Oatmeal   . Other     Black beans, cats, dogs, grass clippings  . Wheat Bran       Family History  Problem Relation Age of Onset  . Heart disease Mother   . Diabetes Mellitus II Mother   . Heart disease Father   . Diabetes Father   . Heart disease Brother   . Cancer Other      Social History Victoria Winters reports that she has quit smoking. She has never used smokeless tobacco. Victoria Winters reports current alcohol use.   Review of Systems CONSTITUTIONAL: No weight loss, fever, chills, weakness or fatigue.  HEENT: Eyes: No visual loss, blurred vision, double vision or yellow sclerae.No hearing loss, sneezing, congestion, runny nose or sore throat.  SKIN: No rash or itching.  CARDIOVASCULAR: pe rhpi RESPIRATORY: No shortness of breath, cough or sputum.  GASTROINTESTINAL: No anorexia, nausea, vomiting or diarrhea. No abdominal pain or blood.  GENITOURINARY: No burning on urination, no polyuria NEUROLOGICAL: No headache, dizziness, syncope, paralysis, ataxia, numbness or tingling in the extremities. No change in bowel or bladder control.  MUSCULOSKELETAL: No muscle, back pain, joint pain or stiffness.  LYMPHATICS: No enlarged nodes. No history of splenectomy.  PSYCHIATRIC: No history of depression or anxiety.  ENDOCRINOLOGIC: No reports of sweating, cold or heat intolerance. No polyuria or polydipsia.  Marland Kitchen   Physical Examination Today's Vitals   05/31/20 1501  BP: (!) 142/68  Pulse: 89  SpO2: 92%  Weight: 226 lb (102.5 kg)  Height: 5\' 1"  (1.549 m)   Body mass index is 42.7 kg/m.  Gen: resting comfortably, no acute distress HEENT: no scleral icterus, pupils equal round and reactive, no palptable cervical adenopathy,  CV: RRR, no  m/r/g, no jvd Resp:bilateral crackles GI: abdomen is soft, non-tender, non-distended, normal bowel sounds, no hepatosplenomegaly MSK: extremities are warm, 1+ bilateral LE edema.  Skin: warm, no rash Neuro:  no focal deficits Psych: appropriate affect     Assessment and Plan  1. Acute on chronic diastolic HF - did well initially after recent discharge, but gradual progressing LE edema, orthopnea, SOB - she is not able to stand on scale at home regularly due to mobility limitations but was able to stand here, up 10 lbs  since hospital discharge - stop lasix, start torsemide 40mg  bid. Labs in 1 week - request pcp records to clarify the indicaitons for florinef as this is going to compete with her diuresis. I dont' have any history and not comortable adjusting today  2. Adrenal insufficiency - per hosptal records, has been on midodrine and florinef. I do not know the full history, family is unaware - competing pharmacology with midodrine and florinef with hydralazine and diuretic. May adjust meds once more is known about this diagnosis. Im not clear on indication for toprol at this time either, if being used for HTN or secondary indication.   Request pcp records   F/u 2 weeks.       Arnoldo Lenis, M.D.

## 2020-05-31 NOTE — Patient Instructions (Signed)
Medication Instructions:  Your physician has recommended you make the following change in your medication:  1.  STOP the Lasix 2.  START Torsemide 20 taking 2 tablets twice a day  *If you need a refill on your cardiac medications before your next appointment, please call your pharmacy*   Lab Work: 06/07/20:  [place]  If you have labs (blood work) drawn today and your tests are completely normal, you will receive your results only by:  [place] (if you have MyChart) OR  A paper copy in the mail If you have any lab test that is abnormal or we need to change your treatment, we will call you to review the results.   Testing/Procedures: None ordered   Follow-Up: At [facility], you and your health needs are our priority.  As part of our continuing mission to provide you with exceptional heart care, we have created designated Provider Care Teams.  These Care Teams include your primary Cardiologist (physician) and Advanced Practice Providers (APPs -  Physician Assistants and Nurse Practitioners) who all work together to provide you with the care you need, when you need it.  We recommend signing up for the patient portal called "MyChart".  Sign up information is provided on this After Visit Summary.  MyChart is used to connect with patients for Virtual Visits (Telemedicine).  Patients are able to view lab/test results, encounter notes, upcoming appointments, etc.  Non-urgent messages can be sent to your provider as well.   To learn more about what you can do with MyChart, go to [email or web address].    Your next appointment:   2 week(s)  The format for your next appointment:   In Person  Provider:   You may see Dr. Harl Bowie, or one of the following Advanced Practice Providers on your designated Care Team:    Bernerd Pho, PA-C   Ermalinda Barrios, [place]     Other Instructions

## 2020-06-05 ENCOUNTER — Telehealth: Payer: Self-pay | Admitting: Cardiology

## 2020-06-05 ENCOUNTER — Other Ambulatory Visit (HOSPITAL_COMMUNITY)
Admission: RE | Admit: 2020-06-05 | Discharge: 2020-06-05 | Disposition: A | Discharge status: Home / Self Care | Payer: 59 | Source: Ambulatory Visit | Attending: Cardiology | Admitting: Cardiology

## 2020-06-05 DIAGNOSIS — Z79899 Other long term (current) drug therapy: Secondary | ICD-10-CM

## 2020-06-05 LAB — BASIC METABOLIC PANEL
Anion gap: 11 (ref 5–15)
BUN: 53 mg/dL — ABNORMAL HIGH (ref 8–23)
CO2: 30 mmol/L (ref 22–32)
Calcium: 8.1 mg/dL — ABNORMAL LOW (ref 8.9–10.3)
Chloride: 100 mmol/L (ref 98–111)
Creatinine, Ser: 2.52 mg/dL — ABNORMAL HIGH (ref 0.44–1.00)
GFR calc Af Amer: 22 mL/min — ABNORMAL LOW (ref >60)
GFR calc non Af Amer: 19 mL/min — ABNORMAL LOW (ref >60)
Glucose, Bld: 239 mg/dL — ABNORMAL HIGH (ref 70–99)
Potassium: 3.1 mmol/L — ABNORMAL LOW (ref 3.5–5.1)
Sodium: 141 mmol/L (ref 135–145)

## 2020-06-05 LAB — MAGNESIUM: Magnesium: 2.6 mg/dL — ABNORMAL HIGH (ref 1.7–2.4)

## 2020-06-05 NOTE — Telephone Encounter (Signed)
On 05/31/20 pt stopped lasix and started torsemide 40 mg bid  Wt 220 lbs per home scale, sister says her weight is unchanged and her eyelids are now puffy and she cannot lie flat.

## 2020-06-05 NOTE — Telephone Encounter (Signed)
Pt's Sister states Pt has made medication changes as instructed, however Pt's legs are still swollen   Please call [phone]   Thanks reneer

## 2020-06-05 NOTE — Progress Notes (Deleted)
Cardiology Office Note    Date:  06/05/2020   ID:  Victoria Winters, DOB 11-06-1951, MRN [id number]  PCP:  Alfonse Flavors, MD  Cardiologist: No primary care provider on file. EPS: None  No chief complaint on file.   History of Present Illness:  Victoria Winters is a 69 y.o. female with history of chronic diastolic CHF recent admission 04/2020 discharge weight 216 pounds.  Discharged on Lasix 40 mg twice daily.  History of CVA, CKD stage IV, adrenal insufficiency on midodrine and Florinef, history of brain aneurysm.  Patient saw Dr. Harl Bowie 05/31/2020 with increased leg edema weight gain of 10 pounds.  She was unable to stand on her scale at home because of mobility limitations.  He stopped Lasix and started torsemide 40 mg twice daily.  He also requested PCP records to clarify indications for Florinef as that was competing with her diuresis.  Family is unaware of the diagnosis.  Patient called in 06/05/2020 complaining of her eyes being puffy and she cannot lay flat but weight was unchanged.  Past Medical History:  Diagnosis Date   Brain aneurysm    Diabetes mellitus without complication (HCC)    Hallucinations    visual and auditory   Memory loss    Recurrent UTI    Renal disorder    Stage 5 chronic kidney disease ([place])    Stroke [facility])     Past Surgical History:  Procedure Laterality Date   CHOLECYSTECTOMY     SKIN GRAFT      Current Medications: No outpatient medications have been marked as taking for the 06/17/20 encounter (Appointment) with Imogene Burn, PA-C.     Allergies:   Penicillins, Chicken allergy, Contrast media [iodinated diagnostic agents], Eggs or egg-derived products, Milk-related compounds, Nickel, Oatmeal, Other, Vaccinium angustifolium, and Wheat bran   Social History   Socioeconomic History   Marital status: Single    Spouse name: Not on file   Number of children: Not on file   Years of education: Not on file   Highest education  level: Not on file  Occupational History   Not on file  Tobacco Use   Smoking status: Former Smoker   Smokeless tobacco: Never Used  Scientific laboratory technician Use: Never used  Substance and Sexual Activity   Alcohol use: Yes    Comment: rarely   Drug use: Never   Sexual activity: Not on file  Other Topics Concern   Not on file  Social History Narrative   Not on file   Social Determinants of Health   Financial Resource Strain:    Difficulty of Paying Living Expenses:   Food Insecurity:    Worried About Charity fundraiser in the Last Year:    Arboriculturist in the Last Year:   Transportation Needs:    Film/video editor (Medical):    Lack of Transportation (Non-Medical):   Physical Activity:    Days of Exercise per Week:    Minutes of Exercise per Session:   Stress:    Feeling of Stress :   Social Connections:    Frequency of Communication with Friends and Family:    Frequency of Social Gatherings with Friends and Family:    Attends Religious Services:    Active Member of Clubs or Organizations:    Attends Archivist Meetings:    Marital Status:      Family History:  The patient's ***family history includes Cancer in an other  family member; Diabetes in her father; Diabetes Mellitus II in her mother; Heart disease in her brother, father, and mother.   ROS:   Please see the history of present illness.    ROS All other systems reviewed and are negative.   PHYSICAL EXAM:   VS:  There were no vitals taken for this visit.  Physical Exam  GEN: Well nourished, well developed, in no acute distress  HEENT: normal  Neck: no JVD, carotid bruits, or masses Cardiac:RRR; no murmurs, rubs, or gallops  Respiratory:  clear to auscultation bilaterally, normal work of breathing GI: soft, nontender, nondistended, + BS Ext: without cyanosis, clubbing, or edema, Good distal pulses bilaterally MS: no deformity or atrophy  Skin: warm and dry, no  rash Neuro:  Alert and Oriented x 3, Strength and sensation are intact Psych: euthymic mood, full affect  Wt Readings from Last 3 Encounters:  05/31/20 226 lb (102.5 kg)  04/25/20 216 lb 11.4 oz (98.3 kg)  04/18/20 220 lb 0.3 oz (99.8 kg)      Studies/Labs Reviewed:   EKG:  EKG is*** ordered today.  The ekg ordered today demonstrates ***  Recent Labs: 04/17/2020: TSH 1.827 04/22/2020: ALT 14; B Natriuretic Peptide 769.0; Hemoglobin 8.7; Platelets 209 06/05/2020: BUN 53; Creatinine, Ser 2.52; Magnesium 2.6; Potassium 3.1; Sodium 141   Lipid Panel No results found for: CHOL, TRIG, HDL, CHOLHDL, VLDL, LDLCALC, LDLDIRECT  Additional studies/ records that were reviewed today include:  Echo 5/13/2021IMPRESSIONS     1. Left ventricular ejection fraction, by estimation, is 65 to 70%. The  left ventricle has normal function. The left ventricle has no regional  wall motion abnormalities. There is mild concentric left ventricular  hypertrophy. Left ventricular diastolic  parameters are consistent with Grade II diastolic dysfunction  (pseudonormalization). Elevated left ventricular end-diastolic pressure.   2. Right ventricular systolic function is normal. The right ventricular  size is normal. Tricuspid regurgitation signal is inadequate for assessing  PA pressure.   3. Left atrial size was moderately dilated.   4. The mitral valve is grossly normal. Mild mitral valve regurgitation.   5. The aortic valve has an indeterminant number of cusps. Aortic valve  regurgitation is not visualized.   6. The inferior vena cava is dilated in size with >50% respiratory  variability, suggesting right atrial pressure of 8 mmHg.   7. A small pericardial effusion is present. The pericardial effusion is  circumferential. There is no evidence of cardiac tamponade.   FINDINGS   Left Ventricle: Left ventricular ejection fraction, by estimation, is 65  to 70%. The left ventricle has normal function. The  left ventricle has no  regional wall motion abnormalities. The left ventricular internal cavity  size was normal in size. There is   mild concentric left ventricular hypertrophy. Left ventricular diastolic  parameters are consistent with Grade II diastolic dysfunction  (pseudonormalization). Elevated left ventricular end-diastolic pressure.   Right Ventricle: The right ventricular size is normal. No increase in  right ventricular wall thickness. Right ventricular systolic function is  normal. Tricuspid regurgitation signal is inadequate for assessing PA  pressure.   Left Atrium: Left atrial size was moderately dilated.   Right Atrium: Right atrial size was normal in size.   Pericardium: A small pericardial effusion is present. The pericardial  effusion is circumferential. There is no evidence of cardiac tamponade.   Mitral Valve: The mitral valve is grossly normal. Mild mitral annular  calcification. Mild mitral valve regurgitation.   Tricuspid Valve: The  tricuspid valve is grossly normal. Tricuspid valve  regurgitation is mild.   Aortic Valve: The aortic valve has an indeterminant number of cusps.  Aortic valve regurgitation is not visualized. Mild aortic valve annular  calcification. There is mild calcification of the aortic valve.   Pulmonic Valve: The pulmonic valve was not well visualized. Pulmonic valve  regurgitation is not visualized.   Aorta: The aortic root is normal in size and structure.   Venous: The inferior vena cava is dilated in size with greater than 50%  respiratory variability, suggesting right atrial pressure of 8 mmHg.   IAS/Shunts: No atrial level shunt detected by color flow Doppler.      ASSESSMENT:    No diagnosis found.   PLAN:  In order of problems listed above:  Chronic diastolic CHF-lasix changed to torsemide 05/31/20 but no improvement in symptoms when she called in 06/05/20. Echo with normal LVEF 65-70% grade 2 DD. Also on Midodrine and  florinef which are competing with her diuresis. Dr. Harl Bowie requested PCP records  Adrenal insufficiency per hospital records on midodrine and florinef and toprol.  CKD stage 4     Medication Adjustments/Labs and Tests Ordered: Current medicines are reviewed at length with the patient today.  Concerns regarding medicines are outlined above.  Medication changes, Labs and Tests ordered today are listed in the Patient Instructions below. There are no Patient Instructions on file for this visit.   Signed, Ermalinda Barrios, PA-C  06/05/2020 2:45 PM    [facility] [address], [place], [place]  [address] Phone: [phone]; Fax: [phone]

## 2020-06-06 NOTE — Telephone Encounter (Signed)
Increase torsemide to 60mg  bid. K was low on labs, is she taking any supplement. If not start KCl 41mEq daily. Repeat BMET/Mg 1 week. Update [place] on symptoms early next week   Zandra Abts MD

## 2020-06-07 MED ORDER — POTASSIUM CHLORIDE CRYS ER 20 MEQ PO TBCR
40.0000 meq | EXTENDED_RELEASE_TABLET | Freq: Every day | ORAL | 3 refills | Status: DC
Start: 2020-06-07 — End: 2020-07-19

## 2020-06-07 MED ORDER — TORSEMIDE 20 MG PO TABS
60.0000 mg | ORAL_TABLET | Freq: Two times a day (BID) | ORAL | 3 refills | Status: DC
Start: 2020-06-07 — End: 2020-07-19

## 2020-06-07 NOTE — Telephone Encounter (Signed)
Sister notified of med changes,plans to repeat bmet on 06/14/20

## 2020-06-11 ENCOUNTER — Inpatient Hospital Stay (HOSPITAL_COMMUNITY): Payer: 59

## 2020-06-11 ENCOUNTER — Other Ambulatory Visit: Payer: Self-pay

## 2020-06-11 ENCOUNTER — Inpatient Hospital Stay (HOSPITAL_COMMUNITY): Payer: 59 | Admitting: Certified Registered Nurse Anesthetist

## 2020-06-11 ENCOUNTER — Encounter (HOSPITAL_COMMUNITY): Payer: Self-pay

## 2020-06-11 ENCOUNTER — Telehealth: Payer: Self-pay | Admitting: Cardiology

## 2020-06-11 ENCOUNTER — Emergency Department (HOSPITAL_COMMUNITY): Payer: 59

## 2020-06-11 ENCOUNTER — Encounter (HOSPITAL_COMMUNITY)
Admission: EM | Disposition: A | Discharge status: Skilled Nursing Facility | Payer: Self-pay | Source: Home / Self Care | Attending: Internal Medicine

## 2020-06-11 ENCOUNTER — Inpatient Hospital Stay (HOSPITAL_COMMUNITY)
Admission: EM | Admit: 2020-06-11 | Discharge: 2020-06-14 | DRG: 493 | Disposition: A | Discharge status: Skilled Nursing Facility | Payer: 59 | Attending: Internal Medicine | Admitting: Internal Medicine

## 2020-06-11 DIAGNOSIS — S82402A Unspecified fracture of shaft of left fibula, initial encounter for closed fracture: Secondary | ICD-10-CM | POA: Diagnosis not present

## 2020-06-11 DIAGNOSIS — E1122 Type 2 diabetes mellitus with diabetic chronic kidney disease: Secondary | ICD-10-CM | POA: Diagnosis present

## 2020-06-11 DIAGNOSIS — N189 Chronic kidney disease, unspecified: Secondary | ICD-10-CM | POA: Diagnosis present

## 2020-06-11 DIAGNOSIS — Y92009 Unspecified place in unspecified non-institutional (private) residence as the place of occurrence of the external cause: Secondary | ICD-10-CM

## 2020-06-11 DIAGNOSIS — S92343A Displaced fracture of fourth metatarsal bone, unspecified foot, initial encounter for closed fracture: Secondary | ICD-10-CM | POA: Diagnosis present

## 2020-06-11 DIAGNOSIS — W19XXXA Unspecified fall, initial encounter: Secondary | ICD-10-CM | POA: Diagnosis present

## 2020-06-11 DIAGNOSIS — E2740 Unspecified adrenocortical insufficiency: Secondary | ICD-10-CM | POA: Diagnosis present

## 2020-06-11 DIAGNOSIS — S82832A Other fracture of upper and lower end of left fibula, initial encounter for closed fracture: Secondary | ICD-10-CM | POA: Diagnosis present

## 2020-06-11 DIAGNOSIS — Z8249 Family history of ischemic heart disease and other diseases of the circulatory system: Secondary | ICD-10-CM

## 2020-06-11 DIAGNOSIS — Z79899 Other long term (current) drug therapy: Secondary | ICD-10-CM

## 2020-06-11 DIAGNOSIS — Z8679 Personal history of other diseases of the circulatory system: Secondary | ICD-10-CM | POA: Diagnosis not present

## 2020-06-11 DIAGNOSIS — E1165 Type 2 diabetes mellitus with hyperglycemia: Secondary | ICD-10-CM | POA: Diagnosis present

## 2020-06-11 DIAGNOSIS — I5032 Chronic diastolic (congestive) heart failure: Secondary | ICD-10-CM | POA: Diagnosis present

## 2020-06-11 DIAGNOSIS — I671 Cerebral aneurysm, nonruptured: Secondary | ICD-10-CM | POA: Diagnosis present

## 2020-06-11 DIAGNOSIS — Z8673 Personal history of transient ischemic attack (TIA), and cerebral infarction without residual deficits: Secondary | ICD-10-CM | POA: Diagnosis not present

## 2020-06-11 DIAGNOSIS — Z20822 Contact with and (suspected) exposure to covid-19: Secondary | ICD-10-CM | POA: Diagnosis present

## 2020-06-11 DIAGNOSIS — I132 Hypertensive heart and chronic kidney disease with heart failure and with stage 5 chronic kidney disease, or end stage renal disease: Secondary | ICD-10-CM | POA: Diagnosis present

## 2020-06-11 DIAGNOSIS — Z794 Long term (current) use of insulin: Secondary | ICD-10-CM

## 2020-06-11 DIAGNOSIS — I5030 Unspecified diastolic (congestive) heart failure: Secondary | ICD-10-CM | POA: Diagnosis present

## 2020-06-11 DIAGNOSIS — R269 Unspecified abnormalities of gait and mobility: Secondary | ICD-10-CM | POA: Diagnosis present

## 2020-06-11 DIAGNOSIS — Z87891 Personal history of nicotine dependence: Secondary | ICD-10-CM | POA: Diagnosis not present

## 2020-06-11 DIAGNOSIS — Z6841 Body Mass Index (BMI) 40.0 and over, adult: Secondary | ICD-10-CM | POA: Diagnosis not present

## 2020-06-11 DIAGNOSIS — N184 Chronic kidney disease, stage 4 (severe): Secondary | ICD-10-CM | POA: Diagnosis present

## 2020-06-11 DIAGNOSIS — T501X5A Adverse effect of loop [high-ceiling] diuretics, initial encounter: Secondary | ICD-10-CM | POA: Diagnosis not present

## 2020-06-11 DIAGNOSIS — Z833 Family history of diabetes mellitus: Secondary | ICD-10-CM

## 2020-06-11 DIAGNOSIS — D631 Anemia in chronic kidney disease: Secondary | ICD-10-CM | POA: Diagnosis present

## 2020-06-11 DIAGNOSIS — S92353A Displaced fracture of fifth metatarsal bone, unspecified foot, initial encounter for closed fracture: Secondary | ICD-10-CM | POA: Diagnosis present

## 2020-06-11 DIAGNOSIS — Z66 Do not resuscitate: Secondary | ICD-10-CM | POA: Diagnosis present

## 2020-06-11 DIAGNOSIS — Z419 Encounter for procedure for purposes other than remedying health state, unspecified: Secondary | ICD-10-CM

## 2020-06-11 DIAGNOSIS — E119 Type 2 diabetes mellitus without complications: Secondary | ICD-10-CM

## 2020-06-11 DIAGNOSIS — S82202A Unspecified fracture of shaft of left tibia, initial encounter for closed fracture: Secondary | ICD-10-CM | POA: Diagnosis not present

## 2020-06-11 DIAGNOSIS — F0150 Vascular dementia without behavioral disturbance: Secondary | ICD-10-CM | POA: Diagnosis present

## 2020-06-11 DIAGNOSIS — S82302A Unspecified fracture of lower end of left tibia, initial encounter for closed fracture: Secondary | ICD-10-CM | POA: Diagnosis present

## 2020-06-11 DIAGNOSIS — R4189 Other symptoms and signs involving cognitive functions and awareness: Secondary | ICD-10-CM | POA: Diagnosis not present

## 2020-06-11 DIAGNOSIS — Z7982 Long term (current) use of aspirin: Secondary | ICD-10-CM

## 2020-06-11 DIAGNOSIS — N179 Acute kidney failure, unspecified: Secondary | ICD-10-CM | POA: Diagnosis present

## 2020-06-11 DIAGNOSIS — Z87440 Personal history of urinary (tract) infections: Secondary | ICD-10-CM

## 2020-06-11 HISTORY — PX: TIBIA IM NAIL INSERTION: SHX2516

## 2020-06-11 LAB — COMPREHENSIVE METABOLIC PANEL
ALT: 15 U/L (ref 0–44)
AST: 16 U/L (ref 15–41)
Albumin: 3 g/dL — ABNORMAL LOW (ref 3.5–5.0)
Alkaline Phosphatase: 80 U/L (ref 38–126)
Anion gap: 10 (ref 5–15)
BUN: 51 mg/dL — ABNORMAL HIGH (ref 8–23)
CO2: 32 mmol/L (ref 22–32)
Calcium: 8.3 mg/dL — ABNORMAL LOW (ref 8.9–10.3)
Chloride: 101 mmol/L (ref 98–111)
Creatinine, Ser: 2.9 mg/dL — ABNORMAL HIGH (ref 0.44–1.00)
GFR calc Af Amer: 18 mL/min — ABNORMAL LOW (ref >60)
GFR calc non Af Amer: 16 mL/min — ABNORMAL LOW (ref >60)
Glucose, Bld: 245 mg/dL — ABNORMAL HIGH (ref 70–99)
Potassium: 4.2 mmol/L (ref 3.5–5.1)
Sodium: 143 mmol/L (ref 135–145)
Total Bilirubin: 0.6 mg/dL (ref 0.3–1.2)
Total Protein: 6 g/dL — ABNORMAL LOW (ref 6.5–8.1)

## 2020-06-11 LAB — CBC WITH DIFFERENTIAL/PLATELET
Abs Immature Granulocytes: 0.1 10*3/uL — ABNORMAL HIGH (ref 0.00–0.07)
Basophils Absolute: 0.1 10*3/uL (ref 0.0–0.1)
Basophils Relative: 1 %
Eosinophils Absolute: 0.3 10*3/uL (ref 0.0–0.5)
Eosinophils Relative: 3 %
HCT: 26.1 % — ABNORMAL LOW (ref 36.0–46.0)
Hemoglobin: 7.9 g/dL — ABNORMAL LOW (ref 12.0–15.0)
Immature Granulocytes: 1 %
Lymphocytes Relative: 20 %
Lymphs Abs: 2.7 10*3/uL (ref 0.7–4.0)
MCH: 27.1 pg (ref 26.0–34.0)
MCHC: 30.3 g/dL (ref 30.0–36.0)
MCV: 89.7 fL (ref 80.0–100.0)
Monocytes Absolute: 0.8 10*3/uL (ref 0.1–1.0)
Monocytes Relative: 6 %
Neutro Abs: 9.1 10*3/uL — ABNORMAL HIGH (ref 1.7–7.7)
Neutrophils Relative %: 69 %
Platelets: 246 10*3/uL (ref 150–400)
RBC: 2.91 MIL/uL — ABNORMAL LOW (ref 3.87–5.11)
RDW: 16.1 % — ABNORMAL HIGH (ref 11.5–15.5)
WBC: 13.1 10*3/uL — ABNORMAL HIGH (ref 4.0–10.5)
nRBC: 0 % (ref 0.0–0.2)

## 2020-06-11 LAB — SARS CORONAVIRUS 2 BY RT PCR (HOSPITAL ORDER, PERFORMED IN ~~LOC~~ HOSPITAL LAB): SARS Coronavirus 2: NEGATIVE

## 2020-06-11 LAB — CREATININE, SERUM
Creatinine, Ser: 2.89 mg/dL — ABNORMAL HIGH (ref 0.44–1.00)
GFR calc Af Amer: 18 mL/min — ABNORMAL LOW (ref >60)
GFR calc non Af Amer: 16 mL/min — ABNORMAL LOW (ref >60)

## 2020-06-11 LAB — CBC
HCT: 24 % — ABNORMAL LOW (ref 36.0–46.0)
Hemoglobin: 7.1 g/dL — ABNORMAL LOW (ref 12.0–15.0)
MCH: 26.6 pg (ref 26.0–34.0)
MCHC: 29.6 g/dL — ABNORMAL LOW (ref 30.0–36.0)
MCV: 89.9 fL (ref 80.0–100.0)
Platelets: 210 10*3/uL (ref 150–400)
RBC: 2.67 MIL/uL — ABNORMAL LOW (ref 3.87–5.11)
RDW: 16.1 % — ABNORMAL HIGH (ref 11.5–15.5)
WBC: 11.4 10*3/uL — ABNORMAL HIGH (ref 4.0–10.5)
nRBC: 0 % (ref 0.0–0.2)

## 2020-06-11 LAB — GLUCOSE, CAPILLARY
Glucose-Capillary: 199 mg/dL — ABNORMAL HIGH (ref 70–99)
Glucose-Capillary: 177 mg/dL — ABNORMAL HIGH (ref 70–99)
Glucose-Capillary: 168 mg/dL — ABNORMAL HIGH (ref 70–99)

## 2020-06-11 LAB — PROTIME-INR
INR: 1 (ref 0.8–1.2)
Prothrombin Time: 12.6 seconds (ref 11.4–15.2)

## 2020-06-11 LAB — ABO/RH: ABO/RH(D): A POS

## 2020-06-11 SURGERY — INSERTION, INTRAMEDULLARY ROD, TIBIA
Anesthesia: General | Site: Leg Lower | Laterality: Left

## 2020-06-11 MED ORDER — SODIUM CHLORIDE 0.9 % IV SOLN: 250.0000 mL | INTRAVENOUS | Status: DC | PRN

## 2020-06-11 MED ORDER — ONDANSETRON HCL 4 MG/2ML IJ SOLN
4.0000 mg | Freq: Once | INTRAMUSCULAR | Status: AC
  Administered 2020-06-11: 4 mg via INTRAVENOUS
  Filled 2020-06-11: qty 2

## 2020-06-11 MED ORDER — CEFAZOLIN SODIUM-DEXTROSE 2-3 GM-%(50ML) IV SOLR
INTRAVENOUS | Status: DC | PRN
Start: 2020-06-11 — End: 2020-06-11
  Administered 2020-06-11: 2 g via INTRAVENOUS

## 2020-06-11 MED ORDER — OXYCODONE HCL 5 MG/5ML PO SOLN: 5.0000 mg | Freq: Once | ORAL | Status: DC | PRN

## 2020-06-11 MED ORDER — SODIUM CHLORIDE 0.9% IV SOLUTION: Freq: Once | INTRAVENOUS | Status: DC

## 2020-06-11 MED ORDER — POLYETHYLENE GLYCOL 3350 17 G PO PACK: 17.0000 g | PACK | Freq: Every day | ORAL | Status: DC | PRN

## 2020-06-11 MED ORDER — EPHEDRINE SULFATE 50 MG/ML IJ SOLN
INTRAMUSCULAR | Status: DC | PRN
Start: 2020-06-11 — End: 2020-06-11
  Administered 2020-06-11 (×3): 5 mg via INTRAVENOUS

## 2020-06-11 MED ORDER — SUCCINYLCHOLINE CHLORIDE 200 MG/10ML IV SOSY
PREFILLED_SYRINGE | INTRAVENOUS | Status: AC
  Filled 2020-06-11: qty 10

## 2020-06-11 MED ORDER — SUGAMMADEX SODIUM 200 MG/2ML IV SOLN
INTRAVENOUS | Status: DC | PRN
Start: 2020-06-11 — End: 2020-06-11
  Administered 2020-06-11: 200 mg via INTRAVENOUS

## 2020-06-11 MED ORDER — SODIUM CHLORIDE 0.9% FLUSH: 3.0000 mL | INTRAVENOUS | Status: DC | PRN

## 2020-06-11 MED ORDER — METOCLOPRAMIDE HCL 5 MG/ML IJ SOLN: 5.0000 mg | Freq: Three times a day (TID) | INTRAMUSCULAR | Status: DC | PRN

## 2020-06-11 MED ORDER — ONDANSETRON HCL 4 MG PO TABS: 4.0000 mg | ORAL_TABLET | Freq: Four times a day (QID) | ORAL | Status: DC | PRN

## 2020-06-11 MED ORDER — PREGABALIN 75 MG PO CAPS
75.0000 mg | ORAL_CAPSULE | Freq: Two times a day (BID) | ORAL | Status: DC
  Administered 2020-06-11 – 2020-06-14 (×6): 75 mg via ORAL
  Filled 2020-06-11 (×6): qty 1

## 2020-06-11 MED ORDER — PROPOFOL 10 MG/ML IV BOLUS
INTRAVENOUS | Status: DC | PRN
Start: 2020-06-11 — End: 2020-06-11
  Administered 2020-06-11: 120 mg via INTRAVENOUS

## 2020-06-11 MED ORDER — ENOXAPARIN SODIUM 30 MG/0.3ML ~~LOC~~ SOLN
30.0000 mg | SUBCUTANEOUS | Status: DC
  Administered 2020-06-12 – 2020-06-14 (×3): 30 mg via SUBCUTANEOUS
  Filled 2020-06-11 (×3): qty 0.3

## 2020-06-11 MED ORDER — FENTANYL CITRATE (PF) 100 MCG/2ML IJ SOLN: 25.0000 ug | INTRAMUSCULAR | Status: DC | PRN

## 2020-06-11 MED ORDER — OXYCODONE HCL 5 MG PO TABS: 5.0000 mg | ORAL_TABLET | Freq: Once | ORAL | Status: DC | PRN

## 2020-06-11 MED ORDER — ACETAMINOPHEN 650 MG RE SUPP
650.0000 mg | Freq: Four times a day (QID) | RECTAL | Status: DC | PRN
  Filled 2020-06-11: qty 1

## 2020-06-11 MED ORDER — ROCURONIUM BROMIDE 10 MG/ML (PF) SYRINGE
PREFILLED_SYRINGE | INTRAVENOUS | Status: AC
  Filled 2020-06-11: qty 10

## 2020-06-11 MED ORDER — MAGNESIUM CITRATE PO SOLN: 1.0000 | Freq: Once | ORAL | Status: DC | PRN

## 2020-06-11 MED ORDER — CHLORHEXIDINE GLUCONATE 0.12 % MT SOLN
OROMUCOSAL | Status: AC
  Administered 2020-06-11: 15 mL via OROMUCOSAL
  Filled 2020-06-11: qty 15

## 2020-06-11 MED ORDER — METOPROLOL SUCCINATE ER 25 MG PO TB24
100.0000 mg | ORAL_TABLET | Freq: Every morning | ORAL | Status: DC
  Administered 2020-06-12 – 2020-06-14 (×3): 100 mg via ORAL
  Filled 2020-06-11 (×3): qty 4

## 2020-06-11 MED ORDER — SUCCINYLCHOLINE CHLORIDE 20 MG/ML IJ SOLN
INTRAMUSCULAR | Status: DC | PRN
Start: 2020-06-11 — End: 2020-06-11
  Administered 2020-06-11: 100 mg via INTRAVENOUS

## 2020-06-11 MED ORDER — SODIUM CHLORIDE 0.9% FLUSH
3.0000 mL | Freq: Two times a day (BID) | INTRAVENOUS | Status: DC
  Administered 2020-06-12 – 2020-06-14 (×4): 3 mL via INTRAVENOUS

## 2020-06-11 MED ORDER — ACETAMINOPHEN 10 MG/ML IV SOLN
INTRAVENOUS | Status: AC
  Administered 2020-06-11: 1000 mg
  Filled 2020-06-11: qty 100

## 2020-06-11 MED ORDER — POTASSIUM CHLORIDE CRYS ER 20 MEQ PO TBCR
40.0000 meq | EXTENDED_RELEASE_TABLET | Freq: Every day | ORAL | Status: DC
  Administered 2020-06-12 – 2020-06-14 (×3): 40 meq via ORAL
  Filled 2020-06-11 (×3): qty 2

## 2020-06-11 MED ORDER — 0.9 % SODIUM CHLORIDE (POUR BTL) OPTIME
TOPICAL | Status: DC | PRN
  Administered 2020-06-11: 1000 mL

## 2020-06-11 MED ORDER — ONDANSETRON HCL 4 MG/2ML IJ SOLN: 4.0000 mg | Freq: Once | INTRAMUSCULAR | Status: DC | PRN

## 2020-06-11 MED ORDER — SODIUM CHLORIDE 0.9 % IV SOLN: INTRAVENOUS | Status: DC

## 2020-06-11 MED ORDER — BACITRACIN ZINC 500 UNIT/GM EX OINT
TOPICAL_OINTMENT | CUTANEOUS | Status: AC
  Filled 2020-06-11: qty 28.35

## 2020-06-11 MED ORDER — LACTATED RINGERS IV SOLN
INTRAVENOUS | Status: DC | PRN
Start: 2020-06-11 — End: 2020-06-11

## 2020-06-11 MED ORDER — ADULT MULTIVITAMIN W/MINERALS CH
1.0000 | ORAL_TABLET | Freq: Every day | ORAL | Status: DC
  Administered 2020-06-12 – 2020-06-14 (×3): 1 via ORAL
  Filled 2020-06-11 (×3): qty 1

## 2020-06-11 MED ORDER — PHENYLEPHRINE 40 MCG/ML (10ML) SYRINGE FOR IV PUSH (FOR BLOOD PRESSURE SUPPORT)
PREFILLED_SYRINGE | INTRAVENOUS | Status: AC
  Filled 2020-06-11: qty 20

## 2020-06-11 MED ORDER — FENTANYL CITRATE (PF) 250 MCG/5ML IJ SOLN
INTRAMUSCULAR | Status: AC
  Filled 2020-06-11: qty 5

## 2020-06-11 MED ORDER — DOCUSATE SODIUM 100 MG PO CAPS
100.0000 mg | ORAL_CAPSULE | Freq: Two times a day (BID) | ORAL | Status: DC
  Administered 2020-06-12 – 2020-06-14 (×6): 100 mg via ORAL
  Filled 2020-06-11 (×6): qty 1

## 2020-06-11 MED ORDER — ROCURONIUM BROMIDE 100 MG/10ML IV SOLN
INTRAVENOUS | Status: DC | PRN
Start: 2020-06-11 — End: 2020-06-11
  Administered 2020-06-11: 40 mg via INTRAVENOUS

## 2020-06-11 MED ORDER — ESCITALOPRAM OXALATE 10 MG PO TABS
10.0000 mg | ORAL_TABLET | Freq: Every morning | ORAL | Status: DC
  Administered 2020-06-12 – 2020-06-14 (×3): 10 mg via ORAL
  Filled 2020-06-11 (×3): qty 1

## 2020-06-11 MED ORDER — MORPHINE SULFATE (PF) 4 MG/ML IV SOLN
4.0000 mg | Freq: Once | INTRAVENOUS | Status: AC
  Administered 2020-06-11: 4 mg via INTRAVENOUS
  Filled 2020-06-11: qty 1

## 2020-06-11 MED ORDER — METHOCARBAMOL 1000 MG/10ML IJ SOLN
500.0000 mg | Freq: Four times a day (QID) | INTRAVENOUS | Status: DC | PRN
  Filled 2020-06-11: qty 5

## 2020-06-11 MED ORDER — FLUDROCORTISONE ACETATE 0.1 MG PO TABS
200.0000 ug | ORAL_TABLET | Freq: Every day | ORAL | Status: DC
  Administered 2020-06-12 – 2020-06-14 (×4): 200 ug via ORAL
  Filled 2020-06-11 (×3): qty 2

## 2020-06-11 MED ORDER — LIDOCAINE HCL (CARDIAC) PF 100 MG/5ML IV SOSY
PREFILLED_SYRINGE | INTRAVENOUS | Status: DC | PRN
Start: 2020-06-11 — End: 2020-06-11
  Administered 2020-06-11: 100 mg via INTRATRACHEAL

## 2020-06-11 MED ORDER — ONDANSETRON HCL 4 MG/2ML IJ SOLN
INTRAMUSCULAR | Status: AC
  Filled 2020-06-11: qty 2

## 2020-06-11 MED ORDER — LIDOCAINE 2% (20 MG/ML) 5 ML SYRINGE
INTRAMUSCULAR | Status: AC
  Filled 2020-06-11: qty 5

## 2020-06-11 MED ORDER — INSULIN ASPART 100 UNIT/ML ~~LOC~~ SOLN
0.0000 [IU] | Freq: Three times a day (TID) | SUBCUTANEOUS | Status: DC
  Administered 2020-06-12 (×2): 2 [IU] via SUBCUTANEOUS
  Administered 2020-06-12: 3 [IU] via SUBCUTANEOUS
  Administered 2020-06-13: 2 [IU] via SUBCUTANEOUS
  Administered 2020-06-13: 4 [IU] via SUBCUTANEOUS
  Administered 2020-06-14: 2 [IU] via SUBCUTANEOUS
  Administered 2020-06-14: 4 [IU] via SUBCUTANEOUS

## 2020-06-11 MED ORDER — QUETIAPINE FUMARATE 25 MG PO TABS
25.0000 mg | ORAL_TABLET | Freq: Every day | ORAL | Status: DC
  Administered 2020-06-11 – 2020-06-14 (×4): 25 mg via ORAL
  Filled 2020-06-11 (×4): qty 1

## 2020-06-11 MED ORDER — ONDANSETRON HCL 4 MG/2ML IJ SOLN
INTRAMUSCULAR | Status: DC | PRN
Start: 2020-06-11 — End: 2020-06-11
  Administered 2020-06-11: 4 mg via INTRAVENOUS

## 2020-06-11 MED ORDER — PROPOFOL 10 MG/ML IV BOLUS
INTRAVENOUS | Status: AC
  Filled 2020-06-11: qty 40

## 2020-06-11 MED ORDER — METOCLOPRAMIDE HCL 5 MG PO TABS: 5.0000 mg | ORAL_TABLET | Freq: Three times a day (TID) | ORAL | Status: DC | PRN

## 2020-06-11 MED ORDER — ALBUTEROL SULFATE (2.5 MG/3ML) 0.083% IN NEBU: 2.5000 mg | INHALATION_SOLUTION | RESPIRATORY_TRACT | Status: DC | PRN

## 2020-06-11 MED ORDER — ASPIRIN EC 81 MG PO TBEC
81.0000 mg | DELAYED_RELEASE_TABLET | Freq: Every day | ORAL | Status: DC
  Administered 2020-06-12 – 2020-06-14 (×3): 81 mg via ORAL
  Filled 2020-06-11 (×3): qty 1

## 2020-06-11 MED ORDER — SENNA 8.6 MG PO TABS
1.0000 | ORAL_TABLET | Freq: Two times a day (BID) | ORAL | Status: DC
  Administered 2020-06-11 – 2020-06-14 (×7): 8.6 mg via ORAL
  Filled 2020-06-11 (×7): qty 1

## 2020-06-11 MED ORDER — LABETALOL HCL 5 MG/ML IV SOLN: 10.0000 mg | INTRAVENOUS | Status: DC | PRN

## 2020-06-11 MED ORDER — EPHEDRINE 5 MG/ML INJ
INTRAVENOUS | Status: AC
  Filled 2020-06-11: qty 10

## 2020-06-11 MED ORDER — HYDRALAZINE HCL 25 MG PO TABS
25.0000 mg | ORAL_TABLET | Freq: Three times a day (TID) | ORAL | Status: DC
  Administered 2020-06-11 – 2020-06-14 (×9): 25 mg via ORAL
  Filled 2020-06-11 (×10): qty 1

## 2020-06-11 MED ORDER — VANCOMYCIN HCL 1000 MG IV SOLR
INTRAVENOUS | Status: DC | PRN
  Administered 2020-06-11: 1000 mg

## 2020-06-11 MED ORDER — MIDODRINE HCL 5 MG PO TABS
10.0000 mg | ORAL_TABLET | Freq: Three times a day (TID) | ORAL | Status: DC
  Administered 2020-06-12 – 2020-06-14 (×8): 10 mg via ORAL
  Filled 2020-06-11 (×8): qty 2

## 2020-06-11 MED ORDER — INSULIN ASPART 100 UNIT/ML ~~LOC~~ SOLN
2.0000 [IU] | Freq: Three times a day (TID) | SUBCUTANEOUS | Status: DC
  Administered 2020-06-12 – 2020-06-14 (×8): 2 [IU] via SUBCUTANEOUS

## 2020-06-11 MED ORDER — VANCOMYCIN HCL 1000 MG IV SOLR
INTRAVENOUS | Status: AC
  Filled 2020-06-11: qty 1000

## 2020-06-11 MED ORDER — ACETAMINOPHEN 325 MG PO TABS: 650.0000 mg | ORAL_TABLET | Freq: Four times a day (QID) | ORAL | Status: DC | PRN

## 2020-06-11 MED ORDER — MELATONIN 5 MG PO TABS
5.0000 mg | ORAL_TABLET | Freq: Every day | ORAL | Status: DC
  Administered 2020-06-11 – 2020-06-14 (×4): 5 mg via ORAL
  Filled 2020-06-11 (×4): qty 1

## 2020-06-11 MED ORDER — HYDROMORPHONE HCL 1 MG/ML IJ SOLN: 0.5000 mg | INTRAMUSCULAR | Status: DC | PRN

## 2020-06-11 MED ORDER — ATORVASTATIN CALCIUM 10 MG PO TABS
20.0000 mg | ORAL_TABLET | Freq: Every day | ORAL | Status: DC
  Administered 2020-06-12 – 2020-06-14 (×3): 20 mg via ORAL
  Filled 2020-06-11 (×4): qty 2

## 2020-06-11 MED ORDER — CHLORHEXIDINE GLUCONATE 0.12 % MT SOLN: 15.0000 mL | Freq: Once | OROMUCOSAL | Status: AC

## 2020-06-11 MED ORDER — BISACODYL 10 MG RE SUPP: 10.0000 mg | Freq: Every day | RECTAL | Status: DC | PRN

## 2020-06-11 MED ORDER — ONDANSETRON HCL 4 MG/2ML IJ SOLN
4.0000 mg | Freq: Four times a day (QID) | INTRAMUSCULAR | Status: DC | PRN
  Filled 2020-06-11: qty 2

## 2020-06-11 MED ORDER — ARTIFICIAL TEARS OPHTHALMIC OINT
TOPICAL_OINTMENT | OPHTHALMIC | Status: AC
  Filled 2020-06-11: qty 3.5

## 2020-06-11 MED ORDER — OXYCODONE HCL 5 MG PO TABS
5.0000 mg | ORAL_TABLET | ORAL | Status: DC | PRN
  Administered 2020-06-11 – 2020-06-14 (×6): 5 mg via ORAL
  Filled 2020-06-11 (×6): qty 1

## 2020-06-11 MED ORDER — ORAL CARE MOUTH RINSE: 15.0000 mL | Freq: Once | OROMUCOSAL | Status: AC

## 2020-06-11 MED ORDER — ACETAMINOPHEN 10 MG/ML IV SOLN: 1000.0000 mg | Freq: Once | INTRAVENOUS | Status: AC | PRN

## 2020-06-11 MED ORDER — TRAZODONE HCL 50 MG PO TABS: 50.0000 mg | ORAL_TABLET | Freq: Every evening | ORAL | Status: DC | PRN

## 2020-06-11 MED ORDER — FENTANYL CITRATE (PF) 250 MCG/5ML IJ SOLN
INTRAMUSCULAR | Status: DC | PRN
  Administered 2020-06-11 (×3): 50 ug via INTRAVENOUS

## 2020-06-11 MED ORDER — INSULIN ASPART 100 UNIT/ML ~~LOC~~ SOLN
0.0000 [IU] | Freq: Every day | SUBCUTANEOUS | Status: DC
  Administered 2020-06-14: 2 [IU] via SUBCUTANEOUS

## 2020-06-11 SURGICAL SUPPLY — 72 items
ADAPTER GUIDE FAST 1.6 (MISCELLANEOUS) ×3 IMPLANT
BANDAGE ESMARK 6X9 LF (GAUZE/BANDAGES/DRESSINGS) ×1 IMPLANT
BIT DRILL 2.5X2.75 QC CALB (BIT) ×3 IMPLANT
BIT DRILL 3.5 (BIT) ×1
BIT DRILL 3.5MM (BIT) ×1 IMPLANT
BIT DRILL CALIBRATED 2.7 (BIT) ×2 IMPLANT
BIT DRILL CALIBRATED 2.7MM (BIT) ×1
BNDG COHESIVE 4X5 TAN STRL (GAUZE/BANDAGES/DRESSINGS) ×3 IMPLANT
BNDG COHESIVE 6X5 TAN NS LF (GAUZE/BANDAGES/DRESSINGS) ×3 IMPLANT
BNDG COHESIVE 6X5 TAN STRL LF (GAUZE/BANDAGES/DRESSINGS) ×3 IMPLANT
BNDG ELASTIC 4X5.8 VLCR STR LF (GAUZE/BANDAGES/DRESSINGS) ×3 IMPLANT
BNDG ESMARK 6X9 LF (GAUZE/BANDAGES/DRESSINGS) ×3
COVER SURGICAL LIGHT HANDLE (MISCELLANEOUS) ×6 IMPLANT
COVER WAND RF STERILE (DRAPES) ×3 IMPLANT
CUFF TOURN SGL QUICK 34 (TOURNIQUET CUFF)
CUFF TOURN SGL QUICK 42 (TOURNIQUET CUFF) IMPLANT
CUFF TRNQT CYL 34X4.125X (TOURNIQUET CUFF) IMPLANT
DRAPE C-ARM 42X72 X-RAY (DRAPES) ×3 IMPLANT
DRAPE C-ARMOR (DRAPES) ×3 IMPLANT
DRAPE IMP U-DRAPE 54X76 (DRAPES) ×3 IMPLANT
DRAPE ORTHO SPLIT 77X108 STRL (DRAPES) ×2
DRAPE SURG ORHT 6 SPLT 77X108 (DRAPES) ×1 IMPLANT
DRAPE U-SHAPE 47X51 STRL (DRAPES) ×3 IMPLANT
DRILL BIT 3.5MM (BIT) ×2
DRSG ADAPTIC 3X8 NADH LF (GAUZE/BANDAGES/DRESSINGS) ×3 IMPLANT
DRSG MEPITEL 4X7.2 (GAUZE/BANDAGES/DRESSINGS) ×3 IMPLANT
DRSG PAD ABDOMINAL 8X10 ST (GAUZE/BANDAGES/DRESSINGS) ×6 IMPLANT
DURAPREP 26ML APPLICATOR (WOUND CARE) ×6 IMPLANT
ELECT REM PT RETURN 9FT ADLT (ELECTROSURGICAL) ×3
ELECTRODE REM PT RTRN 9FT ADLT (ELECTROSURGICAL) ×1 IMPLANT
GAUZE SPONGE 4X4 12PLY STRL (GAUZE/BANDAGES/DRESSINGS) ×3 IMPLANT
GLOVE BIO SURGEON STRL SZ7 (GLOVE) ×3 IMPLANT
GLOVE BIO SURGEON STRL SZ8 (GLOVE) ×6 IMPLANT
GLOVE BIOGEL PI IND STRL 8 (GLOVE) ×1 IMPLANT
GLOVE BIOGEL PI INDICATOR 8 (GLOVE) ×2
GOWN STRL REUS W/ TWL LRG LVL3 (GOWN DISPOSABLE) ×3 IMPLANT
GOWN STRL REUS W/TWL LRG LVL3 (GOWN DISPOSABLE) ×6
K-WIRE ACE 1.6X6 (WIRE) ×6
KIT BASIN OR (CUSTOM PROCEDURE TRAY) ×3 IMPLANT
KIT TURNOVER KIT B (KITS) ×3 IMPLANT
KWIRE ACE 1.6X6 (WIRE) ×2 IMPLANT
NAIL FLEX WIN 3.0MM (Nail) ×3 IMPLANT
PACK ORTHO EXTREMITY (CUSTOM PROCEDURE TRAY) ×3 IMPLANT
PACK UNIVERSAL I (CUSTOM PROCEDURE TRAY) ×3 IMPLANT
PAD ABD 8X10 STRL (GAUZE/BANDAGES/DRESSINGS) ×6 IMPLANT
PAD ARMBOARD 7.5X6 YLW CONV (MISCELLANEOUS) ×6 IMPLANT
PAD CAST 4YDX4 CTTN HI CHSV (CAST SUPPLIES) ×1 IMPLANT
PADDING CAST COTTON 4X4 STRL (CAST SUPPLIES) ×2
PADDING CAST COTTON 6X4 STRL (CAST SUPPLIES) ×3 IMPLANT
PLATE 9H 184 LT MED DIST TIB (Plate) ×3 IMPLANT
SCREW CORTICAL 3.5MM  28MM (Screw) ×2 IMPLANT
SCREW CORTICAL 3.5MM  34MM (Screw) ×2 IMPLANT
SCREW CORTICAL 3.5MM 24MM (Screw) ×6 IMPLANT
SCREW CORTICAL 3.5MM 28MM (Screw) ×1 IMPLANT
SCREW CORTICAL 3.5MM 34MM (Screw) ×1 IMPLANT
SCREW LOCK CORT STAR 3.5X12 (Screw) ×3 IMPLANT
SCREW LOCK CORT STAR 3.5X14 (Screw) ×3 IMPLANT
SCREW LOCK CORT STAR 3.5X22 (Screw) ×3 IMPLANT
SCREW LOCK CORT STAR 3.5X24 (Screw) ×3 IMPLANT
SCREW LOCK CORT STAR 3.5X36 (Screw) ×3 IMPLANT
SCREW LOCK CORT STAR 3.5X40 (Screw) ×3 IMPLANT
SCREW LOW PROFILE 3.5X48MM (Screw) ×3 IMPLANT
SPONGE LAP 18X18 RF (DISPOSABLE) ×3 IMPLANT
STAPLER VISISTAT 35W (STAPLE) IMPLANT
SUT ETHILON 3 0 PS 1 (SUTURE) ×3 IMPLANT
SUT MON AB 2-0 CT1 27 (SUTURE) ×3 IMPLANT
TOWEL GREEN STERILE (TOWEL DISPOSABLE) ×3 IMPLANT
TOWEL GREEN STERILE FF (TOWEL DISPOSABLE) ×3 IMPLANT
TUBE CONNECTING 12'X1/4 (SUCTIONS) ×1
TUBE CONNECTING 12X1/4 (SUCTIONS) ×2 IMPLANT
UNDERPAD 30X36 HEAVY ABSORB (UNDERPADS AND DIAPERS) ×3 IMPLANT
YANKAUER SUCT BULB TIP NO VENT (SUCTIONS) ×3 IMPLANT

## 2020-06-11 NOTE — Op Note (Signed)
06/11/2020  7:14 PM  PATIENT:  Victoria Winters  69 y.o. female  PRE-OPERATIVE DIAGNOSIS: 1.  Left distal tibia fracture      2.  Left fibula lateral malleolus fracture  POST-OPERATIVE DIAGNOSIS:  Same  Procedure(s): 1.  Open treatment left distal tibia fracture with internal fixation   2.  Open treatment left fibula lateral malleolus fracture with internal fixation  SURGEON:  Wylene Simmer, MD  ASSISTANT:  none  ANESTHESIA:   General  EBL:  minimal   TOURNIQUET:   Total Tourniquet Time Documented: Thigh (Left) - 69 minutes Total: Thigh (Left) - 69 minutes  COMPLICATIONS:  None apparent  DISPOSITION:  Extubated, awake and stable to recovery.  INDICATION FOR PROCEDURE: The patient is a 69 year old female with a past medical history significant for diabetes, chronic kidney disease and stroke. She fell this morning injuring her left leg. She has fractures of her left distal tibia and fibula at the lateral malleolus. She presents today for operative treatment of these displaced and unstable left ankle injuries. Her sister who is power of attorney understands the risks and benefits of the alternative treatment options and elect surgical treatment. She specifically understands risks of bleeding, infection, nerve damage, blood clots, nonunion, continued pain, need for additional surgery, amputation and death.  PROCEDURE IN DETAIL: After preoperative consent was obtained and the correct operative site was identified, the patient was brought the operating room and placed upon the operating table. Preoperative antibiotics were administered. General anesthesia was administered. Surgical timeout was taken. The left lower extremity was prepped and draped in standard sterile fashion with a tourniquet around the thigh. The extremity was exsanguinated and the tourniquet was inflated to 350 mmHg. A longitudinal incision was then made over the medial malleolus. Submuscular dissection was then carried along the  medial aspect of the tibia. A 9 hole [employer] distal tibia plate was selected. It was inserted on the handle. A radiograph confirmed the tip of the plate proximally. A small incision was made in this location. The plate was then manipulated along the medial tibial cortex and positioned appropriately. It was provisionally pinned distally. Radiographs confirmed appropriate position of the plate distally. The plate was then secured to the distal tibia with a nonlocking screw which pulled the plate securely down to bone. 4 more locking holes were drilled and filled with locking screws. The block of screws was positioned just above the subchondral bone and just distal to the fracture site which was noted to be very low anterolaterally. There was significant lateral comminution preventing the use of the next 2 more proximal locking holes.  The plate was then used as a reduction tool. The fracture was reduced under fluoroscopic guidance and the plate provisionally pinned to the tibia with a 2.5 mm drill bit. AP and lateral radiographs confirmed appropriate reduction of the fracture site and appropriate position of the plate. The plate was then secured at the proximal end with a 3.5 mm fully threaded nonlocking screw. This pulled the plate securely down to bone. A fluoroscopic view was obtained locating an oblong hole just proximal from the fracture line. This hole was utilized to drill into the distal end of the proximal fragment. A nonlocking screw was inserted pulling the tibia over to the plate further reducing the fracture. The remaining proximal screws were drilled and filled with locking and nonlocking screws. The reduction screw was then removed adjacent to the fracture site to allow adequate working length of the plate. AP and  lateral radiographs confirmed appropriate reduction of the fracture in appropriate position and length of all hardware.  Attention was turned to the distal tip of the fibula. A  small incision was made and dissection carried down to the tip of the fibula. A drill hole was made at the tip of the fibula longitudinally into the medullary canal. A 3 mm [employer] titanium Winn nail was inserted. It was seated appropriately and then pulled back and cut. It was then seated at the tip of the fibula with impactor.  Final AP, mortise and lateral radiographs of the ankle showed appropriate position and length of all hardware and appropriate reduction of the distal tibia and fibular fractures. AP and lateral radiographs of the tibial shaft showed appropriate position and length of the proximal screws.  The wounds were irrigated copiously. Vancomycin powder was sprinkled in all of the wounds along the plate. Subcutaneous tissues were approximated with Vicryl. Skin incisions were closed with staples. Sterile dressings were applied followed by a well-padded short leg splint. The tourniquet was released after application of the dressings. The patient was awakened from anesthesia and transported to the recovery room in stable condition.   FOLLOW UP PLAN: Nonweightbearing on the left lower extremity. Admission to the hospitalist service for management of medical comorbidities. Physical therapy, Occupational Therapy and Case management for discharge planning. Lovenox for DVT prophylaxis followed by aspirin in 2 weeks.

## 2020-06-11 NOTE — H&P (Signed)
Victoria Winters is an 69 y.o. female.   Chief Complaint: Left ankle pain HPI: The patient is a 69 year old female with a past medical history significant for diabetes, stroke and chronic kidney disease.  She fell this morning getting out of bed.  She was unable to bear weight and came to the emergency room at [facility]  X-rays revealed distal tibia and fibula fractures.  She complains of pain in the ankle with any attempted motion.  She feels better when lying still.  She has not a smoker.  I have spoken with her sister via phone.  Her sister is power of attorney due to her history of short-term memory loss after her stroke.  She is on her way to the hospital now.  She provides the history documented above due to the patient's memory loss.  By report the patient takes only aspirin for blood thinning.  Past Medical History:  Diagnosis Date   Brain aneurysm    Diabetes mellitus without complication (HCC)    Hallucinations    visual and auditory   Memory loss    Recurrent UTI    Renal disorder    Stage 5 chronic kidney disease (HCC)    Stroke [facility])     Past Surgical History:  Procedure Laterality Date   CHOLECYSTECTOMY     SKIN GRAFT      Family History  Problem Relation Age of Onset   Heart disease Mother    Diabetes Mellitus II Mother    Heart disease Father    Diabetes Father    Heart disease Brother    Cancer Other    Social History:  reports that she has quit smoking. She has never used smokeless tobacco. She reports current alcohol use. She reports that she does not use drugs.  Allergies:  Allergies  Allergen Reactions   Penicillins Anaphylaxis    Did it involve swelling of the face/tongue/throat, SOB, or low BP? Yes Did it involve sudden or severe rash/hives, skin peeling, or any reaction on the inside of your mouth or nose? Unknown Did you need to seek medical attention at a hospital or doctor's office? Unknown When did it last happen?Within past 10  years If all above answers are "NO", may proceed with cephalosporin use.    Chicken Allergy    Contrast Media [Iodinated Diagnostic Agents]    Eggs Or Egg-Derived Products Swelling    Patient is allergic to egg yolk, but can eat egg white with no problems   Milk-Related Compounds    Nickel    Oatmeal    Other     Black beans, cats, dogs, grass clippings   Vaccinium Angustifolium Hives   Wheat Bran     Medications Prior to Admission  Medication Sig Dispense Refill   metoprolol succinate (TOPROL-XL) 100 MG 24 hr tablet Take 100 mg by mouth every morning.      aspirin EC 81 MG tablet Take 1 tablet (81 mg total) by mouth daily with breakfast. 30 tablet 3   atorvastatin (LIPITOR) 20 MG tablet Take 20 mg by mouth daily.     cetirizine (ZYRTEC) 5 MG tablet Take 5 mg by mouth daily.     escitalopram (LEXAPRO) 10 MG tablet Take 10 mg by mouth every morning.     fludrocortisone (FLORINEF) 0.1 MG tablet Take 2 tablets by mouth daily.     hydrALAZINE (APRESOLINE) 25 MG tablet Take 1 tablet (25 mg total) by mouth 3 (three) times daily. 90 tablet 3  insulin glargine (LANTUS) 100 UNIT/ML injection Inject 0.1 mLs (10 Units total) into the skin at bedtime. (Patient taking differently: Inject 27 Units into the skin at bedtime. ) 10 mL 11   insulin lispro (HUMALOG) 100 UNIT/ML injection Inject 1-17 Units into the skin 3 (three) times daily before meals. Sliding scale starting at; 180-220= 4 units 221-260= 6 units 261-300= 8 units 301-340=10units 341-380=12units     linagliptin (TRADJENTA) 5 MG TABS tablet Take 5 mg by mouth every morning.     Melatonin 1 MG TABS Take 5 mg by mouth at bedtime.      midodrine (PROAMATINE) 10 MG tablet Take 1 tablet (10 mg total) by mouth 3 (three) times daily with meals. 90 tablet 3   Multiple Vitamin (MULTIVITAMIN WITH MINERALS) TABS tablet Take 1 tablet by mouth daily.     potassium chloride SA (KLOR-CON) 20 MEQ tablet Take 2 tablets (40 mEq  total) by mouth daily. 60 tablet 3   pregabalin (LYRICA) 75 MG capsule Take 75 mg by mouth 2 (two) times daily.      QUEtiapine (SEROQUEL) 25 MG tablet Take 1 tablet (25 mg total) by mouth at bedtime.     torsemide (DEMADEX) 20 MG tablet Take 3 tablets (60 mg total) by mouth 2 (two) times daily. 180 tablet 3    Results for orders placed or performed during the hospital encounter of 06/11/20 (from the past 48 hour(s))  Comprehensive metabolic panel     Status: Abnormal   Collection Time: 06/11/20 12:40 PM  Result Value Ref Range   Sodium 143 135 - 145 mmol/L   Potassium 4.2 3.5 - 5.1 mmol/L   Chloride 101 98 - 111 mmol/L   CO2 32 22 - 32 mmol/L   Glucose, Bld 245 (H) 70 - 99 mg/dL    Comment: Glucose reference range applies only to samples taken after fasting for at least 8 hours.   BUN 51 (H) 8 - 23 mg/dL   Creatinine, Ser 2.90 (H) 0.44 - 1.00 mg/dL   Calcium 8.3 (L) 8.9 - 10.3 mg/dL   Total Protein 6.0 (L) 6.5 - 8.1 g/dL   Albumin 3.0 (L) 3.5 - 5.0 g/dL   AST 16 15 - 41 U/L   ALT 15 0 - 44 U/L   Alkaline Phosphatase 80 38 - 126 U/L   Total Bilirubin 0.6 0.3 - 1.2 mg/dL   GFR calc non Af Amer 16 (L) >60 mL/min   GFR calc Af Amer 18 (L) >60 mL/min   Anion gap 10 5 - 15    Comment: Performed at [facility], [address] [place], [place] [address]  CBC with Differential     Status: Abnormal   Collection Time: 06/11/20 12:40 PM  Result Value Ref Range   WBC 13.1 (H) 4.0 - 10.5 K/uL   RBC 2.91 (L) 3.87 - 5.11 MIL/uL   Hemoglobin 7.9 (L) 12.0 - 15.0 g/dL   HCT 26.1 (L) 36 - 46 %   MCV 89.7 80.0 - 100.0 fL   MCH 27.1 26.0 - 34.0 pg   MCHC 30.3 30.0 - 36.0 g/dL   RDW 16.1 (H) 11.5 - 15.5 %   Platelets 246 150 - 400 K/uL   nRBC 0.0 0.0 - 0.2 %   Neutrophils Relative % 69 %   Neutro Abs 9.1 (H) 1.7 - 7.7 K/uL   Lymphocytes Relative 20 %   Lymphs Abs 2.7 0.7 - 4.0 K/uL   Monocytes Relative 6 %  Monocytes Absolute 0.8 0 - 1 K/uL   Eosinophils Relative 3 %   Eosinophils  Absolute 0.3 0 - 0 K/uL   Basophils Relative 1 %   Basophils Absolute 0.1 0 - 0 K/uL   Immature Granulocytes 1 %   Abs Immature Granulocytes 0.10 (H) 0.00 - 0.07 K/uL    Comment: Performed at [facility], [address] [place], [place] [address]  Protime-INR     Status: None   Collection Time: 06/11/20 12:40 PM  Result Value Ref Range   Prothrombin Time 12.6 11.4 - 15.2 seconds   INR 1.0 0.8 - 1.2    Comment: (NOTE) INR goal varies based on device and disease states. Performed at [facility], [address] [place], [place] [address]   SARS Coronavirus 2 by RT PCR (hospital order, performed in [facility] hospital lab) Nasopharyngeal Nasopharyngeal Swab     Status: None   Collection Time: 06/11/20  1:46 PM   Specimen: Nasopharyngeal Swab  Result Value Ref Range   SARS Coronavirus 2 NEGATIVE NEGATIVE    Comment: (NOTE) SARS-CoV-2 target nucleic acids are NOT DETECTED.  The SARS-CoV-2 RNA is generally detectable in upper and lower respiratory specimens during the acute phase of infection. The lowest concentration of SARS-CoV-2 viral copies this assay can detect is 250 copies / mL. A negative result does not preclude SARS-CoV-2 infection and should not be used as the sole basis for treatment or other patient management decisions.  A negative result may occur with improper specimen collection / handling, submission of specimen other than nasopharyngeal swab, presence of viral mutation(s) within the areas targeted by this assay, and inadequate number of viral copies (<250 copies / mL). A negative result must be combined with clinical observations, patient history, and epidemiological information.  Fact Sheet for Patients:   StrictlyIdeas.no  Fact Sheet for Healthcare Providers: BankingDealers.co.za  This test is not yet approved or  cleared by the [place] FDA and has been authorized for detection and/or diagnosis of  SARS-CoV-2 by FDA under an Emergency Use Authorization (EUA).  This EUA will remain in effect (meaning this test can be used) for the duration of the COVID-19 declaration under Section 564(b)(1) of the Act, 21 U.S.C. section 360bbb-3(b)(1), unless the authorization is terminated or revoked sooner.  Performed at [facility], [address] [place], [place] [address]   Glucose, capillary     Status: Abnormal   Collection Time: 06/11/20  3:59 PM  Result Value Ref Range   Glucose-Capillary 199 (H) 70 - 99 mg/dL    Comment: Glucose reference range applies only to samples taken after fasting for at least 8 hours.   DG Chest 1 View  Result Date: 06/11/2020 CLINICAL DATA:  Pain following fall EXAM: CHEST  1 VIEW COMPARISON:  Apr 22, 2020 FINDINGS: There is cardiomegaly with pulmonary venous hypertension. There is mild interstitial edema. No airspace opacity. No pneumothorax. No adenopathy. No bone lesions. IMPRESSION: Cardiomegaly with pulmonary vascular congestion. There is a degree of interstitial edema. Appearance is felt to be indicative of a degree of congestive heart failure. No consolidation. No pneumothorax. Electronically Signed   By: Lowella Grip III M.D.   On: 06/11/2020 13:18   DG Pelvis 1-2 Views  Result Date: 06/11/2020 CLINICAL DATA:  Pain following fall EXAM: PELVIS - 1-2 VIEW COMPARISON:  None. FINDINGS: There is no evidence of pelvic fracture or dislocation. There is slight symmetric narrowing of each hip joint. No erosive change. IMPRESSION: Slight symmetric narrowing  of each hip joint. No fracture or dislocation. Electronically Signed   By: Lowella Grip III M.D.   On: 06/11/2020 13:17   DG Ankle Complete Left  Result Date: 06/11/2020 CLINICAL DATA:  Ankle pain after fall EXAM: LEFT FOOT - COMPLETE 3+ VIEW; LEFT ANKLE COMPLETE - 3+ VIEW COMPARISON:  07/21/2019 FINDINGS: Acute comminuted fracture of the distal left tibial metaphysis with 4 mm of lateral displacement and  mild anterior apex angulation. Dominant fracture fragment measures up to 5.0 cm. No definite fracture extension to the tibiotalar joint. Acute minimally displaced transversely oriented fracture of the distal fibular metaphysis just proximal to the level of the distal tibiofibular joint. Ankle mortise remains congruent without widening or dislocation. Nondisplaced fractures of the fourth and fifth metatarsal necks. Bony alignment of the left foot is maintained other than pes cavus alignment. There is prominent soft tissue swelling of the dorsal foot, ankle, and lower leg. IMPRESSION: 1. Acute comminuted mildly displaced and angulated fracture of the distal left tibial metaphysis. 2. Acute minimally displaced fracture of the distal fibular metaphysis. 3. Nondisplaced fractures of the fourth and fifth metatarsal necks. Electronically Signed   By: Davina Poke D.O.   On: 06/11/2020 12:10   DG Foot Complete Left  Result Date: 06/11/2020 CLINICAL DATA:  Ankle pain after fall EXAM: LEFT FOOT - COMPLETE 3+ VIEW; LEFT ANKLE COMPLETE - 3+ VIEW COMPARISON:  07/21/2019 FINDINGS: Acute comminuted fracture of the distal left tibial metaphysis with 4 mm of lateral displacement and mild anterior apex angulation. Dominant fracture fragment measures up to 5.0 cm. No definite fracture extension to the tibiotalar joint. Acute minimally displaced transversely oriented fracture of the distal fibular metaphysis just proximal to the level of the distal tibiofibular joint. Ankle mortise remains congruent without widening or dislocation. Nondisplaced fractures of the fourth and fifth metatarsal necks. Bony alignment of the left foot is maintained other than pes cavus alignment. There is prominent soft tissue swelling of the dorsal foot, ankle, and lower leg. IMPRESSION: 1. Acute comminuted mildly displaced and angulated fracture of the distal left tibial metaphysis. 2. Acute minimally displaced fracture of the distal fibular  metaphysis. 3. Nondisplaced fractures of the fourth and fifth metatarsal necks. Electronically Signed   By: Davina Poke D.O.   On: 06/11/2020 12:10   DG FEMUR MIN 2 VIEWS LEFT  Result Date: 06/11/2020 CLINICAL DATA:  Fall. EXAM: LEFT FEMUR 2 VIEWS COMPARISON:  07/20/2019. FINDINGS: Slightly angulated fracture of the proximal fibula noted. This lies slightly superior to an old left fibular fracture. No other focal abnormality identified. Tibial plateau is intact. Diffuse osteopenia and mild tricompartment degenerative change. IMPRESSION: Slightly angulated fracture of the proximal fibula is noted. This lies slightly superior to an old left fibular fracture. No other acute abnormality identified. Electronically Signed   By: Marcello Moores  Register   On: 06/11/2020 13:18    Review of Systems no recent fever, chills, nausea, vomiting or changes in her appetite  Blood pressure (!) 173/85, pulse (!) 58, temperature 98 F (36.7 C), temperature source Oral, resp. rate 18, height 5\' 1"  (1.549 m), weight 99.8 kg, SpO2 100 %. Physical Exam  Well-nourished well-developed overweight woman in no apparent distress.  Alert.  Oriented to person and place.  Extraocular motions are intact.  Respirations are unlabored.  The left lower extremity has gross apex anterior angulation at the distal tibia.  Pulses are palpable in the left foot.  No lymphadenopathy is noted.  Intact sensibility to light touch dorsally and  plantarly at the forefoot.  Active plantar flexion dorsiflexion strength is observed at the toes.    Assessment/Plan Left distal tibia and fibula fractures - I explained the nature of this injury to the patient and her sister in detail.  She has a comminuted and displaced fracture of her distal tibial metaphysis as well as a fracture of the distal fibula at the same level.  I believe it is medically necessary to consider surgical treatment for this displaced and unstable left leg injury.  She will need either  intramedullary nailing or open treatment with plating.  Her sister understands the risks and benefits of the alternative treatment options and elect surgical treatment.  She specifically understands risks of bleeding, infection, nerve damage, blood clots, failure to heal, need for additional surgery, amputation and death.  Wylene Simmer, MD 2020-07-02, 5:12 PM

## 2020-06-11 NOTE — Anesthesia Procedure Notes (Signed)
Procedure Name: Intubation Date/Time: 06/11/2020 5:19 PM Performed by: Clovis Cao, CRNA Pre-anesthesia Checklist: Patient identified, Emergency Drugs available, Suction available, Patient being monitored and Timeout performed Patient Re-evaluated:Patient Re-evaluated prior to induction Oxygen Delivery Method: Circle system utilized Preoxygenation: Pre-oxygenation with 100% oxygen Induction Type: IV induction Ventilation: Mask ventilation without difficulty Laryngoscope Size: Miller and 2 Grade View: Grade I Tube type: Oral Tube size: 7.5 mm Number of attempts: 1 Airway Equipment and Method: Stylet Placement Confirmation: ETT inserted through vocal cords under direct vision,  positive ETCO2 and breath sounds checked- equal and bilateral Secured at: 21 cm Tube secured with: Tape Dental Injury: Teeth and Oropharynx as per pre-operative assessment

## 2020-06-11 NOTE — Anesthesia Preprocedure Evaluation (Addendum)
Anesthesia Evaluation  Patient identified by MRN, date of birth, ID band Patient awake    Reviewed: Allergy & Precautions, NPO status , Patient's Chart, lab work & pertinent test results  History of Anesthesia Complications Negative for: history of anesthetic complications  Airway Mallampati: II  TM Distance: >3 FB Neck ROM: Full    Dental  (+) Dental Advisory Given, Teeth Intact   Pulmonary former smoker,    Pulmonary exam normal        Cardiovascular hypertension, Pt. on medications and Pt. on home beta blockers +CHF (HFpEF)  Normal cardiovascular exam   '21 TTE - EF 65 to 70%. Mild concentric LVH. Grade II diastolic dysfunction (pseudonormalization). LA was moderately dilated. Mild MR. A small, circumferential pericardial effusion is present without evidence of cardiac tamponade.    Neuro/Psych  Visual and auditory hallucinations CVA, Residual Symptoms    GI/Hepatic negative GI ROS, Neg liver ROS,   Endo/Other  diabetes, Type 2, Insulin DependentMorbid obesity  Renal/GU CRFRenal disease     Musculoskeletal negative musculoskeletal ROS (+)   Abdominal   Peds  Hematology  (+) anemia ,   Anesthesia Other Findings Anaphylaxis to PCN Covid test negative   Reproductive/Obstetrics                            Anesthesia Physical Anesthesia Plan  ASA: IV  Anesthesia Plan: General   Post-op Pain Management:    Induction: Intravenous  PONV Risk Score and Plan: 3 and Treatment may vary due to age or medical condition, Ondansetron and Midazolam  Airway Management Planned: Oral ETT  Additional Equipment: None  Intra-op Plan:   Post-operative Plan: Extubation in OR  Informed Consent: I have reviewed the patients History and Physical, chart, labs and discussed the procedure including the risks, benefits and alternatives for the proposed anesthesia with the patient or authorized  representative who has indicated his/her understanding and acceptance.     Dental advisory given and Consent reviewed with POA  Plan Discussed with: CRNA and Anesthesiologist  Anesthesia Plan Comments:        Anesthesia Quick Evaluation

## 2020-06-11 NOTE — Progress Notes (Signed)
Pt to CT via transport.

## 2020-06-11 NOTE — ED Triage Notes (Addendum)
Pt brought to ED via [facility] for fall after walking with walker and tripped walking back from bathroom. Pt c/o left foot pain. Pt denies LOC or hitting head.

## 2020-06-11 NOTE — Transfer of Care (Signed)
Immediate Anesthesia Transfer of Care Note  Patient: Victoria Winters  Procedure(s) Performed: OPEN REDUCTION INTERNAL FIXATION LEFT TIBIA (Left Leg Lower)  Patient Location: PACU  Anesthesia Type:General  Level of Consciousness: drowsy  Airway & Oxygen Therapy: Patient Spontanous Breathing and Patient connected to face mask oxygen  Post-op Assessment: Report given to RN and Post -op Vital signs reviewed and stable  Post vital signs: Reviewed and stable  Last Vitals:  Vitals Value Taken Time  BP    Temp    Pulse 63 06/11/20 1918  Resp 15 06/11/20 1918  SpO2 99 % 06/11/20 1918  Vitals shown include unvalidated device data.  Last Pain:  Vitals:   06/11/20 1136  TempSrc: Oral  PainSc: 10-Worst pain ever         Complications: No complications documented.

## 2020-06-11 NOTE — Telephone Encounter (Signed)
Patient's sister called and stated that she was taken by ambulance to the hospital, she had fallen and hit her head, Her legs had been swollen prior to.Her sisiter jennifer can be reached at [phone]

## 2020-06-11 NOTE — Progress Notes (Signed)
Per blood bank, pt needs 2nd type and screen draw, notified OR.

## 2020-06-11 NOTE — ED Notes (Signed)
Ice pack applied to left foot. 

## 2020-06-11 NOTE — H&P (Signed)
Patient Demographics:    Victoria Winters, is a 69 y.o. female  MRN: [id number]   DOB - 09/29/1951  Admit Date - 06/11/2020  Outpatient Primary MD for the patient is Zhou-Talbert, Elwyn Lade, MD   Assessment & Plan:    Principal Problem:   Tibia/fibula fracture, left, closed, initial encounter Active Problems:   CKD (chronic kidney disease) IV   Brain aneurysm   DM (diabetes mellitus) ([place])   Anemia in chronic kidney disease (CKD)   (HFpEF) heart failure with preserved ejection fraction - Grade 2 diastolic dysfunction   History of CVA (cerebrovascular accident)   1)Acute comminuted fracture of the distal left tibial metaphysis/distal fibula metaphysis fracture/fractures of the fourth and fifth metatarsal necks-----the ankle mortise is not disrupted -EDP discussed case with on-call orthopedic surgeon Dr. Doran Durand -Plan is for operative correction on 06/11/2020 -Further management per orthopedic team  2) status post fall--patient with prior history of stroke/ICH--- patient is a poor historian but she does deny head injury or loss of consciousness -CT head pending  3) acute on chronic anemia--patient has chronic anemia of CKD, -baseline hemoglobin usually around 9 currently 7.9, check stool occult blood, monitor H&H closely and transfuse as clinically indicated -Patient denies any obvious bleeding -May benefit from ESA/Procrit  4)AKI----acute kidney injury on CKD stage - IV due to increased diuretic use recently -   creatinine on admission=2.90  , baseline creatinine =2.4    ,  renally adjust medications, avoid nephrotoxic agents / dehydration  / hypotension  5)HFpEF--patient with history of chronic diastolic dysfunction CHF, patient's cardiology team has been titrating up diuretics lately due to increasing lower extremity  edema and fatigue --Please consult cardiology in-house postop prior to discharge to adjust patient diuretic regimen -Patient recent echo with EF of 65 to 70% with grade 2 diastolic dysfunction -Patient baseline weight is usually around 215lb  6)DM2--history of uncontrolled DM with recent A1c of 8.4--- patient will be n.p.o. for orthopedic surgery so Allow some permissive Hyperglycemia rather than risk life-threatening hypoglycemia in a patient with unreliable oral intake. Use Novolog/Humalog Sliding scale insulin with Accu-Cheks/Fingersticks as ordered --Hold Tradjenta on home insulin regimen  7) history of adrenal insufficiency--continue midodrine and hydrocortisone patient is at risk for orthostatic hypotension  8) cognitive impairment/social/ethics--- patient has cognitive impairment--- history of prior aneurysm,  history of prior stroke, patient sister Ms Willeen Cass helps her make decisions -CODE STATUS verified with patient and sister she is a DNR  9)HTN-BP is not at goal, use IV labetalol as needed  Disposition/Need for in-Hospital Stay- patient unable to be discharged at this time due to --left ankle/tibia-fibula fracture as well as metatarsal fracture requiring orthopedic/operative intervention and pain control, anticipate that patient will need SNF rehab  Status is: Inpatient  Remains inpatient appropriate because:left ankle/tibia-fibula fracture as well as metatarsal fracture requiring orthopedic/operative intervention and pain control, anticipate that patient will need SNF rehab   Dispo: The patient is from:  Home              Anticipated d/c is to: SNF              Anticipated d/c date is: 3 days              Patient currently is not medically stable to d/c. Barriers: Not Clinically Stable- -left ankle/tibia-fibula fracture as well as metatarsal fracture requiring orthopedic/operative intervention and pain control, anticipate that patient will need SNF rehab    With  History of - Reviewed by me  Past Medical History:  Diagnosis Date  . Brain aneurysm   . Diabetes mellitus without complication ([place])   . Hallucinations    visual and auditory  . Memory loss   . Recurrent UTI   . Renal disorder   . Stage 5 chronic kidney disease ([place])   . Stroke [facility])       Past Surgical History:  Procedure Laterality Date  . CHOLECYSTECTOMY    . SKIN GRAFT        Chief Complaint  Patient presents with  . Fall      HPI:    Victoria Winters  is a 69 y.o. female  with medical history significant for chronic diastolic heart failure, type 2 diabetes mellitus, stage IV chronic kidney disease, cognitive impairment,cerebellar aneurysm and history of CVA presents on 06/11/2020 to the ED after a fall at home with obvious Lt foot/ankle area deformity and swelling  Patient denies loss of consciousness or head injury however patient's fall was unwitnessed and patient is a poor historian so CT head has been requested  --Denies chest pain, palpitations,  dizziness -Additional history obtained from patient's caregiver and sister Ms. Willeen Cass at bedside  -Apparently patient was in her usual state of health sister found her on the floor laying prone----patient apparently may have been down for up to 2 hours it is unclear--- the patient fell and never called out for help-  --Imaging studies in the ED consistent with left distal tibia, left distal fibula and fourth and fifth metatarsal neck fractures -EDP discussed case with on-call orthopedic physician Dr. Doran Durand who advised transfer to [facility] for orthopedic intervention -- Patient's cardiology team has been increasing her diuretics lately due to increasing lower extremity edema -Creatinine today is 2.9 from a baseline around 2.4, potassium is 4.2 - Hgb is down to 7.9 from a baseline usually around 9    Review of systems:    In addition to the HPI above,   A full Review of  Systems was done, all other  systems reviewed are negative except as noted above in HPI , .    Social History:  Reviewed by me    Social History   Tobacco Use  . Smoking status: Former Research scientist (life sciences)  . Smokeless tobacco: Never Used  Substance Use Topics  . Alcohol use: Yes    Comment: rarely       Family History :  Reviewed by me    Family History  Problem Relation Age of Onset  . Heart disease Mother   . Diabetes Mellitus II Mother   . Heart disease Father   . Diabetes Father   . Heart disease Brother   . Cancer Other      Home Medications:   Prior to Admission medications   Medication Sig Start Date End Date Taking? Authorizing Provider  metoprolol succinate (TOPROL-XL) 100 MG 24 hr tablet Take 100 mg by mouth every morning.  Yes [provider]  aspirin EC 81 MG tablet Take 1 tablet (81 mg total) by mouth daily with breakfast. 04/25/20   Trei Schoch, MD  atorvastatin (LIPITOR) 20 MG tablet Take 20 mg by mouth daily. 09/29/19   [provider]  cetirizine (ZYRTEC) 5 MG tablet Take 5 mg by mouth daily.    [provider]  escitalopram (LEXAPRO) 10 MG tablet Take 10 mg by mouth every morning.    [provider]  fludrocortisone (FLORINEF) 0.1 MG tablet Take 2 tablets by mouth daily. 03/19/20   [provider]  hydrALAZINE (APRESOLINE) 25 MG tablet Take 1 tablet (25 mg total) by mouth 3 (three) times daily. 04/25/20   Roxan Hockey, MD  insulin glargine (LANTUS) 100 UNIT/ML injection Inject 0.1 mLs (10 Units total) into the skin at bedtime. Patient taking differently: Inject 27 Units into the skin at bedtime.  07/29/19   Kathie Dike, MD  insulin lispro (HUMALOG) 100 UNIT/ML injection Inject 1-17 Units into the skin 3 (three) times daily before meals. Sliding scale starting at; 180-220= 4 units 221-260= 6 units 261-300= 8 units 301-340=10units 341-380=12units    [provider]  linagliptin (TRADJENTA) 5 MG TABS tablet Take 5 mg by mouth  every morning.    [provider]  Melatonin 1 MG TABS Take 5 mg by mouth at bedtime.     [provider]  midodrine (PROAMATINE) 10 MG tablet Take 1 tablet (10 mg total) by mouth 3 (three) times daily with meals. 04/25/20   Roxan Hockey, MD  Multiple Vitamin (MULTIVITAMIN WITH MINERALS) TABS tablet Take 1 tablet by mouth daily.    [provider]  potassium chloride SA (KLOR-CON) 20 MEQ tablet Take 2 tablets (40 mEq total) by mouth daily. 06/07/20   Arnoldo Lenis, MD  pregabalin (LYRICA) 75 MG capsule Take 75 mg by mouth 2 (two) times daily.  10/23/19   [provider]  QUEtiapine (SEROQUEL) 25 MG tablet Take 1 tablet (25 mg total) by mouth at bedtime. 07/29/19   Kathie Dike, MD  torsemide (DEMADEX) 20 MG tablet Take 3 tablets (60 mg total) by mouth 2 (two) times daily. 06/07/20 09/05/20  Arnoldo Lenis, MD     Allergies:     Allergies  Allergen Reactions  . Penicillins Anaphylaxis    Did it involve swelling of the face/tongue/throat, SOB, or low BP? Yes Did it involve sudden or severe rash/hives, skin peeling, or any reaction on the inside of your mouth or nose? Unknown Did you need to seek medical attention at a hospital or doctor's office? Unknown When did it last happen?Within past 10 years If all above answers are "NO", may proceed with cephalosporin use.   . Chicken Allergy   . Contrast Media [Iodinated Diagnostic Agents]   . Eggs Or Egg-Derived Products Swelling    Patient is allergic to egg yolk, but can eat egg white with no problems  . Milk-Related Compounds   . Nickel   . Oatmeal   . Other     Black beans, cats, dogs, grass clippings  . Vaccinium Angustifolium Hives  . Wheat Bran      Physical Exam:   Vitals  Blood pressure (!) 173/85, pulse (!) 58, temperature 98 F (36.7 C), temperature source Oral, resp. rate 18, height 5\' 1"  (1.549 m), weight 99.8 kg, SpO2 100 %.  Physical Examination: General appearance -  alert, obese appearing, and in no distress Mental status - alert, oriented to person, place,  and time,   some baseline memory and cognitive deficits since stroke Eyes - sclera anicteric Neck - supple, no JVD elevation , Chest -diminished bases, faint bibasilar Rales  Heart - S1 and S2 normal, regular  Abdomen - soft, nontender, nondistended, increased truncal adiposity noted  neurological - screening mental status exam normal, neck supple without rigidity, cranial nerves II through XII intact, DTR's normal and symmetric -Generalized weakness Extremities -2+ pitting pedal edema noted, intact peripheral pulses Skin - warm, dry MSK-left ankle/ left foot and left lower extremity bruising/ecchymosis swelling, tenderness, no streaking or significant erythema     Data Review:    CBC Recent Labs  Lab 06/11/20 1240  WBC 13.1*  HGB 7.9*  HCT 26.1*  PLT 246  MCV 89.7  MCH 27.1  MCHC 30.3  RDW 16.1*  LYMPHSABS 2.7  MONOABS 0.8  EOSABS 0.3  BASOSABS 0.1   ------------------------------------------------------------------------------------------------------------------  Chemistries  Recent Labs  Lab 06/05/20 1219 06/11/20 1240  NA 141 143  K 3.1* 4.2  CL 100 101  CO2 30 32  GLUCOSE 239* 245*  BUN 53* 51*  CREATININE 2.52* 2.90*  CALCIUM 8.1* 8.3*  MG 2.6*  --   AST  --  16  ALT  --  15  ALKPHOS  --  80  BILITOT  --  0.6   ------------------------------------------------------------------------------------------------------------------ estimated creatinine clearance is 19.8 mL/min (A) (by C-G formula based on SCr of 2.9 mg/dL (H)). ------------------------------------------------------------------------------------------------------------------ No results for input(s): TSH, T4TOTAL, T3FREE, THYROIDAB in the last 72 hours.  Invalid input(s): FREET3   Coagulation profile Recent Labs  Lab 06/11/20 1240  INR 1.0    ------------------------------------------------------------------------------------------------------------------- No results for input(s): DDIMER in the last 72 hours. -------------------------------------------------------------------------------------------------------------------  Cardiac Enzymes No results for input(s): CKMB, TROPONINI, MYOGLOBIN in the last 168 hours.  Invalid input(s): CK ------------------------------------------------------------------------------------------------------------------    Component Value Date/Time   BNP 769.0 (H) 04/22/2020 0945     ---------------------------------------------------------------------------------------------------------------  Urinalysis    Component Value Date/Time   COLORURINE YELLOW 04/17/2020 1519   APPEARANCEUR CLEAR 04/17/2020 1519   LABSPEC 1.008 04/17/2020 1519   PHURINE 6.0 04/17/2020 1519   GLUCOSEU 150 (A) 04/17/2020 1519   HGBUR SMALL (A) 04/17/2020 1519   BILIRUBINUR NEGATIVE 04/17/2020 1519   KETONESUR NEGATIVE 04/17/2020 1519   PROTEINUR 100 (A) 04/17/2020 1519   NITRITE NEGATIVE 04/17/2020 1519   LEUKOCYTESUR NEGATIVE 04/17/2020 1519    ----------------------------------------------------------------------------------------------------------------   Imaging Results:    DG Chest 1 View  Result Date: 06/11/2020 CLINICAL DATA:  Pain following fall EXAM: CHEST  1 VIEW COMPARISON:  Apr 22, 2020 FINDINGS: There is cardiomegaly with pulmonary venous hypertension. There is mild interstitial edema. No airspace opacity. No pneumothorax. No adenopathy. No bone lesions. IMPRESSION: Cardiomegaly with pulmonary vascular congestion. There is a degree of interstitial edema. Appearance is felt to be indicative of a degree of congestive heart failure. No consolidation. No pneumothorax. Electronically Signed   By: Lowella Grip III M.D.   On: 06/11/2020 13:18   DG Pelvis 1-2 Views  Result Date: 06/11/2020 CLINICAL  DATA:  Pain following fall EXAM: PELVIS - 1-2 VIEW COMPARISON:  None. FINDINGS: There is no evidence of pelvic fracture or dislocation. There is slight symmetric narrowing of each hip joint. No erosive change. IMPRESSION: Slight symmetric narrowing of each hip joint. No fracture or dislocation. Electronically Signed   By: Lowella Grip III M.D.   On: 06/11/2020 13:17   DG Ankle Complete Left  Result Date: 06/11/2020 CLINICAL DATA:  Ankle  pain after fall EXAM: LEFT FOOT - COMPLETE 3+ VIEW; LEFT ANKLE COMPLETE - 3+ VIEW COMPARISON:  07/21/2019 FINDINGS: Acute comminuted fracture of the distal left tibial metaphysis with 4 mm of lateral displacement and mild anterior apex angulation. Dominant fracture fragment measures up to 5.0 cm. No definite fracture extension to the tibiotalar joint. Acute minimally displaced transversely oriented fracture of the distal fibular metaphysis just proximal to the level of the distal tibiofibular joint. Ankle mortise remains congruent without widening or dislocation. Nondisplaced fractures of the fourth and fifth metatarsal necks. Bony alignment of the left foot is maintained other than pes cavus alignment. There is prominent soft tissue swelling of the dorsal foot, ankle, and lower leg. IMPRESSION: 1. Acute comminuted mildly displaced and angulated fracture of the distal left tibial metaphysis. 2. Acute minimally displaced fracture of the distal fibular metaphysis. 3. Nondisplaced fractures of the fourth and fifth metatarsal necks. Electronically Signed   By: Davina Poke D.O.   On: 06/11/2020 12:10   DG Foot Complete Left  Result Date: 06/11/2020 CLINICAL DATA:  Ankle pain after fall EXAM: LEFT FOOT - COMPLETE 3+ VIEW; LEFT ANKLE COMPLETE - 3+ VIEW COMPARISON:  07/21/2019 FINDINGS: Acute comminuted fracture of the distal left tibial metaphysis with 4 mm of lateral displacement and mild anterior apex angulation. Dominant fracture fragment measures up to 5.0 cm. No  definite fracture extension to the tibiotalar joint. Acute minimally displaced transversely oriented fracture of the distal fibular metaphysis just proximal to the level of the distal tibiofibular joint. Ankle mortise remains congruent without widening or dislocation. Nondisplaced fractures of the fourth and fifth metatarsal necks. Bony alignment of the left foot is maintained other than pes cavus alignment. There is prominent soft tissue swelling of the dorsal foot, ankle, and lower leg. IMPRESSION: 1. Acute comminuted mildly displaced and angulated fracture of the distal left tibial metaphysis. 2. Acute minimally displaced fracture of the distal fibular metaphysis. 3. Nondisplaced fractures of the fourth and fifth metatarsal necks. Electronically Signed   By: Davina Poke D.O.   On: 06/11/2020 12:10   DG FEMUR MIN 2 VIEWS LEFT  Result Date: 06/11/2020 CLINICAL DATA:  Fall. EXAM: LEFT FEMUR 2 VIEWS COMPARISON:  07/20/2019. FINDINGS: Slightly angulated fracture of the proximal fibula noted. This lies slightly superior to an old left fibular fracture. No other focal abnormality identified. Tibial plateau is intact. Diffuse osteopenia and mild tricompartment degenerative change. IMPRESSION: Slightly angulated fracture of the proximal fibula is noted. This lies slightly superior to an old left fibular fracture. No other acute abnormality identified. Electronically Signed   By: Marcello Moores  Register   On: 06/11/2020 13:18    Radiological Exams on Admission: DG Chest 1 View  Result Date: 06/11/2020 CLINICAL DATA:  Pain following fall EXAM: CHEST  1 VIEW COMPARISON:  Apr 22, 2020 FINDINGS: There is cardiomegaly with pulmonary venous hypertension. There is mild interstitial edema. No airspace opacity. No pneumothorax. No adenopathy. No bone lesions. IMPRESSION: Cardiomegaly with pulmonary vascular congestion. There is a degree of interstitial edema. Appearance is felt to be indicative of a degree of congestive heart  failure. No consolidation. No pneumothorax. Electronically Signed   By: Lowella Grip III M.D.   On: 06/11/2020 13:18   DG Pelvis 1-2 Views  Result Date: 06/11/2020 CLINICAL DATA:  Pain following fall EXAM: PELVIS - 1-2 VIEW COMPARISON:  None. FINDINGS: There is no evidence of pelvic fracture or dislocation. There is slight symmetric narrowing of each hip joint. No erosive change. IMPRESSION: Slight  symmetric narrowing of each hip joint. No fracture or dislocation. Electronically Signed   By: Lowella Grip III M.D.   On: 06/11/2020 13:17   DG Ankle Complete Left  Result Date: 06/11/2020 CLINICAL DATA:  Ankle pain after fall EXAM: LEFT FOOT - COMPLETE 3+ VIEW; LEFT ANKLE COMPLETE - 3+ VIEW COMPARISON:  07/21/2019 FINDINGS: Acute comminuted fracture of the distal left tibial metaphysis with 4 mm of lateral displacement and mild anterior apex angulation. Dominant fracture fragment measures up to 5.0 cm. No definite fracture extension to the tibiotalar joint. Acute minimally displaced transversely oriented fracture of the distal fibular metaphysis just proximal to the level of the distal tibiofibular joint. Ankle mortise remains congruent without widening or dislocation. Nondisplaced fractures of the fourth and fifth metatarsal necks. Bony alignment of the left foot is maintained other than pes cavus alignment. There is prominent soft tissue swelling of the dorsal foot, ankle, and lower leg. IMPRESSION: 1. Acute comminuted mildly displaced and angulated fracture of the distal left tibial metaphysis. 2. Acute minimally displaced fracture of the distal fibular metaphysis. 3. Nondisplaced fractures of the fourth and fifth metatarsal necks. Electronically Signed   By: Davina Poke D.O.   On: 06/11/2020 12:10   DG Foot Complete Left  Result Date: 06/11/2020 CLINICAL DATA:  Ankle pain after fall EXAM: LEFT FOOT - COMPLETE 3+ VIEW; LEFT ANKLE COMPLETE - 3+ VIEW COMPARISON:  07/21/2019 FINDINGS: Acute  comminuted fracture of the distal left tibial metaphysis with 4 mm of lateral displacement and mild anterior apex angulation. Dominant fracture fragment measures up to 5.0 cm. No definite fracture extension to the tibiotalar joint. Acute minimally displaced transversely oriented fracture of the distal fibular metaphysis just proximal to the level of the distal tibiofibular joint. Ankle mortise remains congruent without widening or dislocation. Nondisplaced fractures of the fourth and fifth metatarsal necks. Bony alignment of the left foot is maintained other than pes cavus alignment. There is prominent soft tissue swelling of the dorsal foot, ankle, and lower leg. IMPRESSION: 1. Acute comminuted mildly displaced and angulated fracture of the distal left tibial metaphysis. 2. Acute minimally displaced fracture of the distal fibular metaphysis. 3. Nondisplaced fractures of the fourth and fifth metatarsal necks. Electronically Signed   By: Davina Poke D.O.   On: 06/11/2020 12:10   DG FEMUR MIN 2 VIEWS LEFT  Result Date: 06/11/2020 CLINICAL DATA:  Fall. EXAM: LEFT FEMUR 2 VIEWS COMPARISON:  07/20/2019. FINDINGS: Slightly angulated fracture of the proximal fibula noted. This lies slightly superior to an old left fibular fracture. No other focal abnormality identified. Tibial plateau is intact. Diffuse osteopenia and mild tricompartment degenerative change. IMPRESSION: Slightly angulated fracture of the proximal fibula is noted. This lies slightly superior to an old left fibular fracture. No other acute abnormality identified. Electronically Signed   By: Marcello Moores  Register   On: 06/11/2020 13:18    DVT Prophylaxis -SCD  --postop DVT prophylaxis as per orthopedic team AM Labs Ordered, also please review Full Orders  Family Communication: Admission, patients condition and plan of care including tests being ordered have been discussed with the patient and sister at bedside who indicate understanding and agree  with the plan   Code Status - DNR Likely DC to SNF refer after ortho/ankle surgery  Condition   Stable  Roxan Hockey M.D on 06/11/2020 at 5:06 PM Go to [email or web address] -  for contact info  [facility] - Office  [phone]

## 2020-06-11 NOTE — ED Notes (Signed)
Pt transported to xray 

## 2020-06-11 NOTE — ED Provider Notes (Signed)
[facility] Provider Note   CSN: [id number] Arrival date & time: 06/11/20  1129     History Chief Complaint  Patient presents with  . Fall    Victoria Winters is a 69 y.o. female.  Patient is a 69 year old female with past medical history of congestive heart failure, chronic renal insufficiency, anemia, prior CVA, diabetes, and short-term memory loss.  She presents today for evaluation of a left leg injury.  Patient was ambulating to the bathroom when she fell and injured her leg.  She denies having struck her head or other injury.  The history is provided by the patient.  Fall This is a new problem. The current episode started 1 to 2 hours ago. The problem occurs constantly. The problem has not changed since onset.Pertinent negatives include no chest pain, no headaches and no shortness of breath. Exacerbated by: Movement and palpation. Nothing relieves the symptoms. She has tried nothing for the symptoms.       Past Medical History:  Diagnosis Date  . Brain aneurysm   . Diabetes mellitus without complication ([place])   . Hallucinations    visual and auditory  . Memory loss   . Recurrent UTI   . Renal disorder   . Stage 5 chronic kidney disease ([place])   . Stroke [facility])     Patient Active Problem List   Diagnosis Date Noted  . Acute on chronic heart failure with preserved ejection fraction (HFpEF) --grade 2 diastolic dysfunction 50/08/3817  . Acute respiratory failure with hypoxia ([place]) 04/23/2020  . Heart failure ([place]) 04/22/2020  . Acute and chronic respiratory failure with hypoxia ([place]) 04/17/2020  . DNR (do not resuscitate) 04/17/2020  . Leukocytosis 04/17/2020  . Anemia in chronic kidney disease (CKD) 04/17/2020  . Closed fracture of distal end of right fibula with routine healing 11/23/2019  . Palliative care by specialist   . Goals of care, counseling/discussion   . History of CVA (cerebrovascular accident)   . Acute encephalopathy 07/21/2019  . Fall  07/20/2019  . 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 Brain aneurysm 07/20/2019  . DM (diabetes mellitus) ([place]) 07/20/2019  . Bilateral fibular fractures 07/20/2019  . CVA (cerebral vascular accident) ([place]) 07/20/2019  . Hallucination 07/20/2019    Past Surgical History:  Procedure Laterality Date  . CHOLECYSTECTOMY    . SKIN GRAFT       OB History    Gravida  2   Para  2   Term  2   Preterm      AB      Living        SAB      TAB      Ectopic      Multiple      Live Births              Family History  Problem Relation Age of Onset  . Heart disease Mother   . Diabetes Mellitus II Mother   . Heart disease Father   . Diabetes Father   . Heart disease Brother   . Cancer Other     Social History   Tobacco Use  . Smoking status: Former Research scientist (life sciences)  . Smokeless tobacco: Never Used  Vaping Use  . Vaping Use: Never used  Substance Use Topics  . Alcohol use: Yes    Comment: rarely  . Drug use: Never    Home Medications Prior to Admission medications   Medication Sig Start Date End Date Taking?  Authorizing Provider  aspirin EC 81 MG tablet Take 1 tablet (81 mg total) by mouth daily with breakfast. 04/25/20   Emokpae, Courage, MD  atorvastatin (LIPITOR) 20 MG tablet Take 20 mg by mouth daily. 09/29/19   [provider]  cetirizine (ZYRTEC) 5 MG tablet Take 5 mg by mouth daily.    [provider]  escitalopram (LEXAPRO) 10 MG tablet Take 10 mg by mouth every morning.    [provider]  fludrocortisone (FLORINEF) 0.1 MG tablet Take 2 tablets by mouth daily. 03/19/20   [provider]  hydrALAZINE (APRESOLINE) 25 MG tablet Take 1 tablet (25 mg total) by mouth 3 (three) times daily. 04/25/20   Roxan Hockey, MD  insulin glargine (LANTUS) 100 UNIT/ML injection Inject 0.1 mLs (10 Units total) into the skin at bedtime. Patient taking differently: Inject 27 Units into the skin at bedtime.  07/29/19   Kathie Dike, MD    insulin lispro (HUMALOG) 100 UNIT/ML injection Inject 1-17 Units into the skin 3 (three) times daily before meals. Sliding scale starting at; 180-220= 4 units 221-260= 6 units 261-300= 8 units 301-340=10units 341-380=12units    [provider]  linagliptin (TRADJENTA) 5 MG TABS tablet Take 5 mg by mouth every morning.    [provider]  Melatonin 1 MG TABS Take 5 mg by mouth at bedtime.     [provider]  metoprolol succinate (TOPROL-XL) 100 MG 24 hr tablet Take 100 mg by mouth every morning.     [provider]  midodrine (PROAMATINE) 10 MG tablet Take 1 tablet (10 mg total) by mouth 3 (three) times daily with meals. 04/25/20   Roxan Hockey, MD  Multiple Vitamin (MULTIVITAMIN WITH MINERALS) TABS tablet Take 1 tablet by mouth daily.    [provider]  potassium chloride SA (KLOR-CON) 20 MEQ tablet Take 2 tablets (40 mEq total) by mouth daily. 06/07/20   Arnoldo Lenis, MD  pregabalin (LYRICA) 75 MG capsule Take 75 mg by mouth 2 (two) times daily.  10/23/19   [provider]  QUEtiapine (SEROQUEL) 25 MG tablet Take 1 tablet (25 mg total) by mouth at bedtime. 07/29/19   Kathie Dike, MD  torsemide (DEMADEX) 20 MG tablet Take 3 tablets (60 mg total) by mouth 2 (two) times daily. 06/07/20 09/05/20  Arnoldo Lenis, MD    Allergies    Penicillins, Chicken allergy, Contrast media [iodinated diagnostic agents], Eggs or egg-derived products, Milk-related compounds, Nickel, Oatmeal, Other, Vaccinium angustifolium, and Wheat bran  Review of Systems   Review of Systems  Respiratory: Negative for shortness of breath.   Cardiovascular: Negative for chest pain.  Neurological: Negative for headaches.  All other systems reviewed and are negative.   Physical Exam Updated Vital Signs BP (!) 199/82   Pulse 62   Temp 98 F (36.7 C) (Oral)   Resp 18   Ht 5\' 1"  (1.549 m)   Wt 99.8 kg   SpO2 94%   BMI 41.57 kg/m   Physical  Exam Vitals and nursing note reviewed.  Constitutional:      General: She is not in acute distress.    Appearance: She is well-developed. She is not diaphoretic.  HENT:     Head: Normocephalic and atraumatic.  Cardiovascular:     Rate and Rhythm: Normal rate and regular rhythm.     Heart sounds: No murmur heard.  No friction rub. No gallop.   Pulmonary:     Effort: Pulmonary effort is normal. No  respiratory distress.     Breath sounds: Normal breath sounds. No wheezing.  Abdominal:     General: Bowel sounds are normal. There is no distension.     Palpations: Abdomen is soft.     Tenderness: There is no abdominal tenderness.  Musculoskeletal:        General: Normal range of motion.     Cervical back: Normal range of motion and neck supple.     Right lower leg: Edema present.     Left lower leg: Edema present.     Comments: There is ecchymosis and swelling of the left lower leg and ankle extending into the foot.  Motor and sensation are intact and DP pulses are palpable.  There is 2-3+ pitting edema of both lower extremities.  Skin:    General: Skin is warm and dry.  Neurological:     Mental Status: She is alert and oriented to person, place, and time.     ED Results / Procedures / Treatments   Labs (all labs ordered are listed, but only abnormal results are displayed) Labs Reviewed  COMPREHENSIVE METABOLIC PANEL  CBC WITH DIFFERENTIAL/PLATELET  PROTIME-INR    EKG None  Radiology DG Ankle Complete Left  Result Date: 06/11/2020 CLINICAL DATA:  Ankle pain after fall EXAM: LEFT FOOT - COMPLETE 3+ VIEW; LEFT ANKLE COMPLETE - 3+ VIEW COMPARISON:  07/21/2019 FINDINGS: Acute comminuted fracture of the distal left tibial metaphysis with 4 mm of lateral displacement and mild anterior apex angulation. Dominant fracture fragment measures up to 5.0 cm. No definite fracture extension to the tibiotalar joint. Acute minimally displaced transversely oriented fracture of the distal fibular  metaphysis just proximal to the level of the distal tibiofibular joint. Ankle mortise remains congruent without widening or dislocation. Nondisplaced fractures of the fourth and fifth metatarsal necks. Bony alignment of the left foot is maintained other than pes cavus alignment. There is prominent soft tissue swelling of the dorsal foot, ankle, and lower leg. IMPRESSION: 1. Acute comminuted mildly displaced and angulated fracture of the distal left tibial metaphysis. 2. Acute minimally displaced fracture of the distal fibular metaphysis. 3. Nondisplaced fractures of the fourth and fifth metatarsal necks. Electronically Signed   By: Davina Poke D.O.   On: 06/11/2020 12:10   DG Foot Complete Left  Result Date: 06/11/2020 CLINICAL DATA:  Ankle pain after fall EXAM: LEFT FOOT - COMPLETE 3+ VIEW; LEFT ANKLE COMPLETE - 3+ VIEW COMPARISON:  07/21/2019 FINDINGS: Acute comminuted fracture of the distal left tibial metaphysis with 4 mm of lateral displacement and mild anterior apex angulation. Dominant fracture fragment measures up to 5.0 cm. No definite fracture extension to the tibiotalar joint. Acute minimally displaced transversely oriented fracture of the distal fibular metaphysis just proximal to the level of the distal tibiofibular joint. Ankle mortise remains congruent without widening or dislocation. Nondisplaced fractures of the fourth and fifth metatarsal necks. Bony alignment of the left foot is maintained other than pes cavus alignment. There is prominent soft tissue swelling of the dorsal foot, ankle, and lower leg. IMPRESSION: 1. Acute comminuted mildly displaced and angulated fracture of the distal left tibial metaphysis. 2. Acute minimally displaced fracture of the distal fibular metaphysis. 3. Nondisplaced fractures of the fourth and fifth metatarsal necks. Electronically Signed   By: Davina Poke D.O.   On: 06/11/2020 12:10    Procedures Procedures (including critical care  time)  Medications Ordered in ED Medications  morphine 4 MG/ML injection 4 mg (has no administration in time  range)  ondansetron (ZOFRAN) injection 4 mg (has no administration in time range)    ED Course  I have reviewed the triage vital signs and the nursing notes.  Pertinent labs & imaging results that were available during my care of the patient were reviewed by me and considered in my medical decision making (see chart for details).    MDM Rules/Calculators/A&P  Patient with extensive past medical history presenting after fall.  Patient has distal tibia and fibular fractures on x-ray.  These findings were discussed with Dr. Doran Durand from orthopedics.  He is recommending transfer to [facility] for surgical repair.  I have spoken with Dr. Denton Brick who agrees to make arrangements for admission there.  Final Clinical Impression(s) / ED Diagnoses Final diagnoses:  None    Rx / DC Orders ED Discharge Orders    None       Veryl Speak, MD 06/11/20 1514

## 2020-06-11 NOTE — ED Notes (Signed)
[facility] at bedside 

## 2020-06-11 NOTE — ED Notes (Signed)
ED TO INPATIENT HANDOFF REPORT  ED Nurse Name and Phone #: [phone]  S Name/Age/Gender Victoria Winters 69 y.o. female Room/Bed: APA18/APA18  Code Status   Code Status: Prior  Home/SNF/Other Home Patient oriented to: self, place, time and situation Is this baseline? Yes   Triage Complete: Triage complete  Chief Complaint Tibia/fibula fracture, left, closed, initial encounter [S82.202A, S82.402A]  Triage Note Pt brought to ED via [facility] for fall after walking with walker and tripped walking back from bathroom. Pt c/o left foot pain. Pt denies LOC or hitting head.     Allergies Allergies  Allergen Reactions  . Penicillins Anaphylaxis    Did it involve swelling of the face/tongue/throat, SOB, or low BP? Yes Did it involve sudden or severe rash/hives, skin peeling, or any reaction on the inside of your mouth or nose? Unknown Did you need to seek medical attention at a hospital or doctor's office? Unknown When did it last happen?Within past 10 years If all above answers are "NO", may proceed with cephalosporin use.   . Chicken Allergy   . Contrast Media [Iodinated Diagnostic Agents]   . Eggs Or Egg-Derived Products Swelling    Patient is allergic to egg yolk, but can eat egg white with no problems  . Milk-Related Compounds   . Nickel   . Oatmeal   . Other     Black beans, cats, dogs, grass clippings  . Vaccinium Angustifolium Hives  . Wheat Bran     Level of Care/Admitting Diagnosis ED Disposition    ED Disposition Condition Comment   Admit  Hospital Area: [facility] [[id number]]  Level of Care: Med-Surg [16]  May admit patient to [facility] or [facility] if equivalent level of care is available:: No  Covid Evaluation: Asymptomatic Screening Protocol (No Symptoms)  Diagnosis: Tibia/fibula fracture, left, closed, initial encounter [[id number]]  Admitting Physician: Morrison Old  Attending Physician: Morrison Old   Estimated length of stay: 3 - 4 days  Certification:: I certify this patient will need inpatient services for at least 2 midnights  Bed request comments: med-surg       B Medical/Surgery History Past Medical History:  Diagnosis Date  . Brain aneurysm   . Diabetes mellitus without complication ([place])   . Hallucinations    visual and auditory  . Memory loss   . Recurrent UTI   . Renal disorder   . Stage 5 chronic kidney disease ([place])   . Stroke [facility])    Past Surgical History:  Procedure Laterality Date  . CHOLECYSTECTOMY    . SKIN GRAFT       A IV Location/Drains/Wounds Patient Lines/Drains/Airways Status    Active Line/Drains/Airways    Name Placement date Placement time Site Days   Peripheral IV 06/11/20 Right Antecubital 06/11/20  1235  Antecubital  less than 1   External Urinary Catheter 04/22/20  1056  --  50          Intake/Output Last 24 hours No intake or output data in the 24 hours ending 06/11/20 1501  Labs/Imaging Results for orders placed or performed during the hospital encounter of 06/11/20 (from the past 48 hour(s))  Comprehensive metabolic panel     Status: Abnormal   Collection Time: 06/11/20 12:40 PM  Result Value Ref Range   Sodium 143 135 - 145 mmol/L   Potassium 4.2 3.5 - 5.1 mmol/L   Chloride 101 98 - 111 mmol/L   CO2 32 22 - 32 mmol/L  Glucose, Bld 245 (H) 70 - 99 mg/dL    Comment: Glucose reference range applies only to samples taken after fasting for at least 8 hours.   BUN 51 (H) 8 - 23 mg/dL   Creatinine, Ser 2.90 (H) 0.44 - 1.00 mg/dL   Calcium 8.3 (L) 8.9 - 10.3 mg/dL   Total Protein 6.0 (L) 6.5 - 8.1 g/dL   Albumin 3.0 (L) 3.5 - 5.0 g/dL   AST 16 15 - 41 U/L   ALT 15 0 - 44 U/L   Alkaline Phosphatase 80 38 - 126 U/L   Total Bilirubin 0.6 0.3 - 1.2 mg/dL   GFR calc non Af Amer 16 (L) >60 mL/min   GFR calc Af Amer 18 (L) >60 mL/min   Anion gap 10 5 - 15    Comment: Performed at [facility], [address] [place], [place]  [address]  CBC with Differential     Status: Abnormal   Collection Time: 06/11/20 12:40 PM  Result Value Ref Range   WBC 13.1 (H) 4.0 - 10.5 K/uL   RBC 2.91 (L) 3.87 - 5.11 MIL/uL   Hemoglobin 7.9 (L) 12.0 - 15.0 g/dL   HCT 26.1 (L) 36 - 46 %   MCV 89.7 80.0 - 100.0 fL   MCH 27.1 26.0 - 34.0 pg   MCHC 30.3 30.0 - 36.0 g/dL   RDW 16.1 (H) 11.5 - 15.5 %   Platelets 246 150 - 400 K/uL   nRBC 0.0 0.0 - 0.2 %   Neutrophils Relative % 69 %   Neutro Abs 9.1 (H) 1.7 - 7.7 K/uL   Lymphocytes Relative 20 %   Lymphs Abs 2.7 0.7 - 4.0 K/uL   Monocytes Relative 6 %   Monocytes Absolute 0.8 0 - 1 K/uL   Eosinophils Relative 3 %   Eosinophils Absolute 0.3 0 - 0 K/uL   Basophils Relative 1 %   Basophils Absolute 0.1 0 - 0 K/uL   Immature Granulocytes 1 %   Abs Immature Granulocytes 0.10 (H) 0.00 - 0.07 K/uL    Comment: Performed at [facility], [address], [place], [place] [address]  Protime-INR     Status: None   Collection Time: 06/11/20 12:40 PM  Result Value Ref Range   Prothrombin Time 12.6 11.4 - 15.2 seconds   INR 1.0 0.8 - 1.2    Comment: (NOTE) INR goal varies based on device and disease states. Performed at [facility], [address], [place], [place] [address]    DG Chest 1 View  Result Date: 06/11/2020 CLINICAL DATA:  Pain following fall EXAM: CHEST  1 VIEW COMPARISON:  Apr 22, 2020 FINDINGS: There is cardiomegaly with pulmonary venous hypertension. There is mild interstitial edema. No airspace opacity. No pneumothorax. No adenopathy. No bone lesions. IMPRESSION: Cardiomegaly with pulmonary vascular congestion. There is a degree of interstitial edema. Appearance is felt to be indicative of a degree of congestive heart failure. No consolidation. No pneumothorax. Electronically Signed   By: Lowella Grip III M.D.   On: 06/11/2020 13:18   DG Pelvis 1-2 Views  Result Date: 06/11/2020 CLINICAL DATA:  Pain following fall EXAM: PELVIS - 1-2 VIEW COMPARISON:  None. FINDINGS: There  is no evidence of pelvic fracture or dislocation. There is slight symmetric narrowing of each hip joint. No erosive change. IMPRESSION: Slight symmetric narrowing of each hip joint. No fracture or dislocation. Electronically Signed   By: Lowella Grip III M.D.   On: 06/11/2020 13:17  DG Ankle Complete Left  Result Date: 06/11/2020 CLINICAL DATA:  Ankle pain after fall EXAM: LEFT FOOT - COMPLETE 3+ VIEW; LEFT ANKLE COMPLETE - 3+ VIEW COMPARISON:  07/21/2019 FINDINGS: Acute comminuted fracture of the distal left tibial metaphysis with 4 mm of lateral displacement and mild anterior apex angulation. Dominant fracture fragment measures up to 5.0 cm. No definite fracture extension to the tibiotalar joint. Acute minimally displaced transversely oriented fracture of the distal fibular metaphysis just proximal to the level of the distal tibiofibular joint. Ankle mortise remains congruent without widening or dislocation. Nondisplaced fractures of the fourth and fifth metatarsal necks. Bony alignment of the left foot is maintained other than pes cavus alignment. There is prominent soft tissue swelling of the dorsal foot, ankle, and lower leg. IMPRESSION: 1. Acute comminuted mildly displaced and angulated fracture of the distal left tibial metaphysis. 2. Acute minimally displaced fracture of the distal fibular metaphysis. 3. Nondisplaced fractures of the fourth and fifth metatarsal necks. Electronically Signed   By: Davina Poke D.O.   On: 06/11/2020 12:10   DG Foot Complete Left  Result Date: 06/11/2020 CLINICAL DATA:  Ankle pain after fall EXAM: LEFT FOOT - COMPLETE 3+ VIEW; LEFT ANKLE COMPLETE - 3+ VIEW COMPARISON:  07/21/2019 FINDINGS: Acute comminuted fracture of the distal left tibial metaphysis with 4 mm of lateral displacement and mild anterior apex angulation. Dominant fracture fragment measures up to 5.0 cm. No definite fracture extension to the tibiotalar joint. Acute minimally displaced transversely  oriented fracture of the distal fibular metaphysis just proximal to the level of the distal tibiofibular joint. Ankle mortise remains congruent without widening or dislocation. Nondisplaced fractures of the fourth and fifth metatarsal necks. Bony alignment of the left foot is maintained other than pes cavus alignment. There is prominent soft tissue swelling of the dorsal foot, ankle, and lower leg. IMPRESSION: 1. Acute comminuted mildly displaced and angulated fracture of the distal left tibial metaphysis. 2. Acute minimally displaced fracture of the distal fibular metaphysis. 3. Nondisplaced fractures of the fourth and fifth metatarsal necks. Electronically Signed   By: Davina Poke D.O.   On: 06/11/2020 12:10   DG FEMUR MIN 2 VIEWS LEFT  Result Date: 06/11/2020 CLINICAL DATA:  Fall. EXAM: LEFT FEMUR 2 VIEWS COMPARISON:  07/20/2019. FINDINGS: Slightly angulated fracture of the proximal fibula noted. This lies slightly superior to an old left fibular fracture. No other focal abnormality identified. Tibial plateau is intact. Diffuse osteopenia and mild tricompartment degenerative change. IMPRESSION: Slightly angulated fracture of the proximal fibula is noted. This lies slightly superior to an old left fibular fracture. No other acute abnormality identified. Electronically Signed   By: Marcello Moores  Register   On: 06/11/2020 13:18    Pending Labs Unresulted Labs (From admission, onward) Comment          Start     Ordered   06/11/20 1319  SARS Coronavirus 2 by RT PCR (hospital order, performed in [facility] hospital lab) Nasopharyngeal Nasopharyngeal Swab  (Tier 2 (TAT 2 hrs))  Once,   STAT       Question Answer Comment  Is this test for diagnosis or screening Screening   Symptomatic for COVID-19 as defined by CDC No   Hospitalized for COVID-19 No   Admitted to ICU for COVID-19 No   Previously tested for COVID-19 Yes   Resident in a congregate (group) care setting No   Employed in healthcare setting  No   Pregnant No   Has patient completed COVID  vaccination(s) (2 doses of Pfizer/Moderna 1 dose of [employer]) Unknown      06/11/20 1319          Vitals/Pain Today's Vitals   06/11/20 1230 06/11/20 1300 06/11/20 1330 06/11/20 1400  BP: (!) 209/83 (!) 178/62 (!) 190/72 (!) 173/85  Pulse: 61 60 (!) 58 (!) 58  Resp:      Temp:      TempSrc:      SpO2: 97% 98% 99% 100%  Weight:      Height:      PainSc:        Isolation Precautions No active isolations  Medications Medications  labetalol (NORMODYNE) injection 10 mg (has no administration in time range)  morphine 4 MG/ML injection 4 mg (4 mg Intravenous Given 06/11/20 1236)  ondansetron (ZOFRAN) injection 4 mg (4 mg Intravenous Given 06/11/20 1235)    Mobility walks with device Moderate fall risk   Focused Assessments    R Recommendations: See Admitting Provider Note  Report given to:   Additional Notes:

## 2020-06-11 NOTE — Anesthesia Postprocedure Evaluation (Signed)
Anesthesia Post Note  Patient: Victoria Winters  Procedure(s) Performed: OPEN REDUCTION INTERNAL FIXATION LEFT TIBIA (Left Leg Lower)     Patient location during evaluation: PACU Anesthesia Type: General Level of consciousness: awake and alert Pain management: pain level controlled Vital Signs Assessment: post-procedure vital signs reviewed and stable Respiratory status: spontaneous breathing, nonlabored ventilation, respiratory function stable and patient connected to nasal cannula oxygen Cardiovascular status: blood pressure returned to baseline and stable Postop Assessment: no apparent nausea or vomiting Anesthetic complications: no   No complications documented.  Last Vitals:  Vitals:   06/11/20 2015 06/11/20 2057  BP: (!) 178/84 (!) 186/78  Pulse: 66 62  Resp: 15 16  Temp: 36.8 C 36.8 C  SpO2: 99% 100%    Last Pain:  Vitals:   06/11/20 2057  TempSrc: Oral  PainSc: 10-Worst pain ever                 Halo Shevlin

## 2020-06-12 ENCOUNTER — Encounter (HOSPITAL_COMMUNITY): Payer: Self-pay | Admitting: Orthopedic Surgery

## 2020-06-12 DIAGNOSIS — N179 Acute kidney failure, unspecified: Secondary | ICD-10-CM

## 2020-06-12 LAB — CBC
HCT: 23.2 % — ABNORMAL LOW (ref 36.0–46.0)
Hemoglobin: 7.1 g/dL — ABNORMAL LOW (ref 12.0–15.0)
MCH: 27.8 pg (ref 26.0–34.0)
MCHC: 30.6 g/dL (ref 30.0–36.0)
MCV: 91 fL (ref 80.0–100.0)
Platelets: 191 10*3/uL (ref 150–400)
RBC: 2.55 MIL/uL — ABNORMAL LOW (ref 3.87–5.11)
RDW: 16.1 % — ABNORMAL HIGH (ref 11.5–15.5)
WBC: 12.8 10*3/uL — ABNORMAL HIGH (ref 4.0–10.5)
nRBC: 0 % (ref 0.0–0.2)

## 2020-06-12 LAB — GLUCOSE, CAPILLARY
Glucose-Capillary: 210 mg/dL — ABNORMAL HIGH (ref 70–99)
Glucose-Capillary: 275 mg/dL — ABNORMAL HIGH (ref 70–99)
Glucose-Capillary: 203 mg/dL — ABNORMAL HIGH (ref 70–99)
Glucose-Capillary: 228 mg/dL — ABNORMAL HIGH (ref 70–99)

## 2020-06-12 LAB — BASIC METABOLIC PANEL
Anion gap: 11 (ref 5–15)
BUN: 46 mg/dL — ABNORMAL HIGH (ref 8–23)
CO2: 31 mmol/L (ref 22–32)
Calcium: 8.1 mg/dL — ABNORMAL LOW (ref 8.9–10.3)
Chloride: 103 mmol/L (ref 98–111)
Creatinine, Ser: 3 mg/dL — ABNORMAL HIGH (ref 0.44–1.00)
GFR calc Af Amer: 18 mL/min — ABNORMAL LOW (ref >60)
GFR calc non Af Amer: 15 mL/min — ABNORMAL LOW (ref >60)
Glucose, Bld: 199 mg/dL — ABNORMAL HIGH (ref 70–99)
Potassium: 4.3 mmol/L (ref 3.5–5.1)
Sodium: 145 mmol/L (ref 135–145)

## 2020-06-12 LAB — HEMOGLOBIN A1C
Hgb A1c MFr Bld: 8.1 % — ABNORMAL HIGH (ref 4.8–5.6)
Mean Plasma Glucose: 185.77 mg/dL

## 2020-06-12 MED ORDER — CEFAZOLIN SODIUM-DEXTROSE 2-4 GM/100ML-% IV SOLN
2.0000 g | INTRAVENOUS | Status: AC
  Filled 2020-06-12: qty 100

## 2020-06-12 MED ORDER — ASPIRIN EC 81 MG PO TBEC
81.0000 mg | DELAYED_RELEASE_TABLET | Freq: Two times a day (BID) | ORAL | 0 refills | Status: DC
Start: 2020-06-12 — End: 2020-08-19

## 2020-06-12 MED ORDER — OXYCODONE HCL 5 MG PO TABS: 5.0000 mg | ORAL_TABLET | ORAL | 0 refills | Status: AC | PRN

## 2020-06-12 MED ORDER — SENNA 8.6 MG PO TABS
2.0000 | ORAL_TABLET | Freq: Two times a day (BID) | ORAL | 0 refills | Status: DC
Start: 2020-06-12 — End: 2020-07-19

## 2020-06-12 MED ORDER — POVIDONE-IODINE 10 % EX SWAB
2.0000 "application " | Freq: Once | CUTANEOUS | Status: AC
  Administered 2020-06-12: 2 via TOPICAL

## 2020-06-12 MED ORDER — LACTATED RINGERS IV SOLN: INTRAVENOUS | Status: DC

## 2020-06-12 MED ORDER — DOCUSATE SODIUM 100 MG PO CAPS
100.0000 mg | ORAL_CAPSULE | Freq: Two times a day (BID) | ORAL | 0 refills | Status: DC
Start: 2020-06-12 — End: 2020-07-16

## 2020-06-12 MED ORDER — CHLORHEXIDINE GLUCONATE 4 % EX LIQD
60.0000 mL | Freq: Once | CUTANEOUS | Status: AC
  Administered 2020-06-12: 4 via TOPICAL

## 2020-06-12 NOTE — NC FL2 (Signed)
[place] LEVEL OF CARE SCREENING TOOL     IDENTIFICATION  Patient Name: Victoria Winters Birthdate: Jan 17, 1951 Sex: female Admission Date (Current Location): 06/11/2020  [facility] and [place] Number:  Herbalist and Address:  [facility], [place] [address] [place], [place] [address]      Provider Number: [id number]  Attending Physician Name and Address:  Little Ishikawa, MD  Relative Name and Phone Number:  Willeen Cass - sister - [phone]    Current Level of Care: Hospital Recommended Level of Care: [place] Prior Approval Number:    Date Approved/Denied:   PASRR Number: [id number]  Discharge Plan: SNF    Current Diagnoses: Patient Active Problem List   Diagnosis Date Noted  . AKI (acute kidney injury) ([place]) 06/12/2020  . Tibia/fibula fracture, left, closed, initial encounter 06/11/2020  . (HFpEF) heart failure with preserved ejection fraction - Grade 2 diastolic dysfunction 30/08/2329  . Acute on chronic heart failure with preserved ejection fraction (HFpEF) --grade 2 diastolic dysfunction 07/62/2633  . Acute respiratory failure with hypoxia ([place]) 04/23/2020  . Heart failure ([place]) 04/22/2020  . Acute and chronic respiratory failure with hypoxia ([place]) 04/17/2020  . DNR (do not resuscitate) 04/17/2020  . Leukocytosis 04/17/2020  . Anemia in chronic kidney disease (CKD) 04/17/2020  . Closed fracture of distal end of right fibula with routine healing 11/23/2019  . Palliative care by specialist   . Goals of care, counseling/discussion   . History of CVA (cerebrovascular accident)   . Acute encephalopathy 07/21/2019  . Fall 07/20/2019  . CKD (chronic kidney disease) IV 07/20/2019  . Brain aneurysm 07/20/2019  . DM (diabetes mellitus) ([place]) 07/20/2019  . Bilateral fibular fractures 07/20/2019  . CVA (cerebral vascular accident) ([place]) 07/20/2019  . Hallucination 07/20/2019    Orientation RESPIRATION  BLADDER Height & Weight     Self, Situation, Place  O2, Normal External catheter Weight: 99.8 kg Height:  5\' 1"  (154.9 cm)  BEHAVIORAL SYMPTOMS/MOOD NEUROLOGICAL BOWEL NUTRITION STATUS      Continent Diet (See discharge summary)  AMBULATORY STATUS COMMUNICATION OF NEEDS Skin   Extensive Assist Verbally Surgical wounds                       Personal Care Assistance Level of Assistance  Bathing, Dressing Bathing Assistance: Maximum assistance   Dressing Assistance: Maximum assistance     Functional Limitations Info  Sight, Hearing, Speech Sight Info: Adequate Hearing Info: Adequate Speech Info: Adequate    SPECIAL CARE FACTORS FREQUENCY  PT (By licensed PT), OT (By licensed OT)     PT Frequency: 5 x per week OT Frequency: 5 x per week            Contractures Contractures Info: Not present    Additional Factors Info  Code Status, Allergies Code Status Info: DNR Allergies Info: penicillin, chicken, contrast media, eggs, milk, nickel, oatmeal, black beans, vaccinium, wheat           Current Medications (06/12/2020):  This is the current hospital active medication list Current Facility-Administered Medications  Medication Dose Route Frequency Provider Last Rate Last Admin  . 0.9 %  sodium chloride infusion (Manually program via Guardrails IV Fluids)   Intravenous Once Wylene Simmer, MD      . 0.9 %  sodium chloride infusion (Manually program via Guardrails IV Fluids)   Intravenous Once Wylene Simmer, MD      . 0.9 %  sodium  chloride infusion  250 mL Intravenous PRN Wylene Simmer, MD      . acetaminophen (TYLENOL) tablet 650 mg  650 mg Oral Q6H PRN Wylene Simmer, MD       Or  . acetaminophen (TYLENOL) suppository 650 mg  650 mg Rectal Q6H PRN Wylene Simmer, MD      . albuterol (PROVENTIL) (2.5 MG/3ML) 0.083% nebulizer solution 2.5 mg  2.5 mg Nebulization Q2H PRN Wylene Simmer, MD      . aspirin EC tablet 81 mg  81 mg Oral Q breakfast Wylene Simmer, MD   81 mg at 06/12/20  1014  . atorvastatin (LIPITOR) tablet 20 mg  20 mg Oral Daily Wylene Simmer, MD   20 mg at 06/12/20 1010  . bisacodyl (DULCOLAX) suppository 10 mg  10 mg Rectal Daily PRN Wylene Simmer, MD      . ceFAZolin (ANCEF) IVPB 2g/100 mL premix  2 g Intravenous On Call to [place], MD      . docusate sodium (COLACE) capsule 100 mg  100 mg Oral BID Wylene Simmer, MD   100 mg at 06/12/20 1011  . enoxaparin (LOVENOX) injection 30 mg  30 mg Subcutaneous Q24H Wylene Simmer, MD   30 mg at 06/12/20 0900  . escitalopram (LEXAPRO) tablet 10 mg  10 mg Oral q morning - 10a Wylene Simmer, MD   10 mg at 06/12/20 1011  . fludrocortisone (FLORINEF) tablet 200 mcg  200 mcg Oral Daily Wylene Simmer, MD   200 mcg at 06/12/20 1012  . hydrALAZINE (APRESOLINE) tablet 25 mg  25 mg Oral TID Wylene Simmer, MD   25 mg at 06/12/20 1013  . HYDROmorphone (DILAUDID) injection 0.5-1 mg  0.5-1 mg Intravenous Q2H PRN Wylene Simmer, MD      . insulin aspart (novoLOG) injection 0-5 Units  0-5 Units Subcutaneous QHS Wylene Simmer, MD      . insulin aspart (novoLOG) injection 0-9 Units  0-9 Units Subcutaneous TID WC Wylene Simmer, MD   3 Units at 06/12/20 1214  . insulin aspart (novoLOG) injection 2 Units  2 Units Subcutaneous TID WC Wylene Simmer, MD   2 Units at 06/12/20 1214  . labetalol (NORMODYNE) injection 10 mg  10 mg Intravenous Q4H PRN Wylene Simmer, MD      . lactated ringers infusion   Intravenous Continuous Wylene Simmer, MD 50 mL/hr at 06/12/20 1042 New Bag at 06/12/20 1042  . magnesium citrate solution 1 Bottle  1 Bottle Oral Once PRN Wylene Simmer, MD      . melatonin tablet 5 mg  5 mg Oral QHS Wylene Simmer, MD   5 mg at 06/11/20 2208  . methocarbamol (ROBAXIN) 500 mg in dextrose 5 % 50 mL IVPB  500 mg Intravenous Q6H PRN Wylene Simmer, MD      . metoCLOPramide (REGLAN) tablet 5-10 mg  5-10 mg Oral Q8H PRN Wylene Simmer, MD       Or  . metoCLOPramide (REGLAN) injection 5-10 mg  5-10 mg Intravenous Q8H PRN Wylene Simmer, MD      .  metoprolol succinate (TOPROL-XL) 24 hr tablet 100 mg  100 mg Oral q morning - 10a Wylene Simmer, MD   100 mg at 06/12/20 1011  . midodrine (PROAMATINE) tablet 10 mg  10 mg Oral TID WC Wylene Simmer, MD   10 mg at 06/12/20 1211  . multivitamin with minerals tablet 1 tablet  1 tablet Oral Daily Wylene Simmer, MD   1 tablet at 06/12/20 1012  .  ondansetron (ZOFRAN) tablet 4 mg  4 mg Oral Q6H PRN Wylene Simmer, MD       Or  . ondansetron [facility]) injection 4 mg  4 mg Intravenous Q6H PRN Wylene Simmer, MD      . oxyCODONE (Oxy IR/ROXICODONE) immediate release tablet 5 mg  5 mg Oral Q4H PRN Wylene Simmer, MD   5 mg at 06/12/20 0955  . polyethylene glycol (MIRALAX / GLYCOLAX) packet 17 g  17 g Oral Daily PRN Wylene Simmer, MD      . potassium chloride SA (KLOR-CON) CR tablet 40 mEq  40 mEq Oral Daily Wylene Simmer, MD   40 mEq at 06/12/20 1011  . pregabalin (LYRICA) capsule 75 mg  75 mg Oral BID Wylene Simmer, MD   75 mg at 06/12/20 1012  . QUEtiapine (SEROQUEL) tablet 25 mg  25 mg Oral QHS Wylene Simmer, MD   25 mg at 06/11/20 2207  . senna (SENOKOT) tablet 8.6 mg  1 tablet Oral BID Wylene Simmer, MD   8.6 mg at 06/12/20 1011  . sodium chloride flush (NS) 0.9 % injection 3 mL  3 mL Intravenous Q12H Wylene Simmer, MD   3 mL at 06/12/20 1051  . sodium chloride flush (NS) 0.9 % injection 3 mL  3 mL Intravenous PRN Wylene Simmer, MD      . traZODone (DESYREL) tablet 50 mg  50 mg Oral QHS PRN Wylene Simmer, MD         Discharge Medications: Please see discharge summary for a list of discharge medications.  Relevant Imaging Results:  Relevant Lab Results:   Additional Information SSN: [place]  [place], [place]

## 2020-06-12 NOTE — Progress Notes (Addendum)
PROGRESS NOTE    Victoria Winters  PFX:[id number] DOB: 17-Jan-1951 DOA: 06/11/2020 PCP: Alfonse Flavors, MD   Brief Narrative:  Victoria Winters  is a 69 y.o. female with medical history significant for chronic diastolic heart failure, type 2 diabetes mellitus, stage IV chronic kidney disease, cognitive impairment,cerebellar aneurysm and history of CVA presents on 06/11/2020 to the ED after a fall at home with obvious Lt foot/ankle area deformity and swelling. Patient denies loss of consciousness or head injury however patient's fall was unwitnessed and patient is a poor historian so CT head has been requested. Apparently patient was in her usual state of health sister found her on the floor laying prone----patient apparently may have been down for up to 2 hours it is unclear--- the patient fell and never called out for help. In the ED: imaging consistent with left distal tibia, left distal fibula and fourth and fifth metatarsal neck fractures. Orthopedic physician Dr. Doran Durand consulted in ED who advised transfer to [facility] for orthopedic intervention. Patient's cardiology team has been increasing her diuretics lately due to increasing lower extremity edema.   Assessment & Plan:   Principal Problem:   Tibia/fibula fracture, left, closed, initial encounter Active Problems:   CKD (chronic kidney disease) IV   Brain aneurysm   DM (diabetes mellitus) (HCC)   History of CVA (cerebrovascular accident)   Anemia in chronic kidney disease (CKD)   (HFpEF) heart failure with preserved ejection fraction - Grade 2 diastolic dysfunction   AKI (acute kidney injury) (HCC)   Acute comminuted fracture of the distal left tibial metaphysis/distal fibula metaphysis fracture/fractures of the fourth and fifth metatarsal necks-----the ankle mortise is not disrupted - Orthopedics following - Dr. Doran Durand consulted previously - ORIF on 06/11/2020 - tolerated procedure well - Further management per orthopedic team -  Lovenox in house; 81mg  ASA BID upon D/C for DVT ppx - Pain well controlled  Ambulatory dysfunction with suspected mechanical fall - History of stroke/ICH - Poor historian but she denies head injury or loss of consciousness - CT head with no acute process  Acute on chronic anemia(of chronic disease/CKD) - Baseline hemoglobin usually around 9  - Patient denies any obvious bleeding - Likely some hemodilution will affect and blood loss and surgery -Downtrending over the past 48 hours, patient remains asymptomatic however if less than 7.0 or symptomatic would transfuse 2 units  AKI on CKD 4 - Baseline creatinine =2.4 Lab Results  Component Value Date   CREATININE 3.00 (H) 06/12/2020   CREATININE 2.89 (H) 06/11/2020   CREATININE 2.90 (H) 06/11/2020  -Likely in setting of poor p.o. intake, recently increased diuretics and dehydration, hold nephrotoxic irrigations for now  HFpEF not in acute exacerbation - Outpatient cardiology team has been titrating up diuretics lately due to increasing lower extremity edema and fatigue - Patient recent echo with EF of 65 to 70% with grade 2 diastolic dysfunction - Patient baseline weight is usually around 215lb -essentially at baseline now -Patient will need to follow-up with cardiology for further evaluation and diuretic adjustment after discharge -Not currently on diuretics as below  IDDM2, uncontrolled - A1c of 8.4 -Continue Novolog/Humalog Sliding scale insulin with Accu-Cheks/Fingersticks as ordered --Hold Tradjenta -Continue hypoglycemic protocol  History of adrenal insufficiency - Continue midodrine and hydrocortisone patient is at risk for orthostatic hypotension   Cognitive impairment/social/ethics - Patient has cognitive impairment at baseline --- history of prior aneurysm, history of prior stroke, patient sister MsJennifer Smithhelps her make decisions -CODE STATUS verified with  patient and sister she is a DNR  HTN,  essential, uncontrolled - Blood pressure poorly controlled perioperatively likely in the setting of pain  -Continue IV labetalol as needed   DVT prophylaxis: Lovenox Code Status: DNR Family Communication: None at bedside, attempted to call  Status is: Inpatient  Dispo: The patient is from: Home              Anticipated d/c is to: Home              Anticipated d/c date is: 24 to 48 hours pending clinical course              Patient currently not medically stable for discharge given need for close monitoring postoperatively, PT OT evaluation for safe disposition  Consultants:   Orthopedic surgery  Procedures:   Left tibia ORIF 06/11/2020  Antimicrobials:  None indicated  Subjective: No acute issues or events overnight denies nausea, vomiting, diarrhea, constipation, headache, fevers, chills.  Pain currently well controlled.  Objective: Vitals:   06/12/20 1011 06/12/20 1013 06/12/20 1201 06/12/20 1404  BP: (!) 161/65 (!) 161/65 (!) 156/72 (!) 149/56  Pulse: 71  69 62  Resp:   16 16  Temp:   98.1 F (36.7 C) 97.9 F (36.6 C)  TempSrc:   Oral Oral  SpO2:   99% 100%  Weight:      Height:        Intake/Output Summary (Last 24 hours) at 06/12/2020 1617 Last data filed at 06/12/2020 1300 Gross per 24 hour  Intake 1430 ml  Output 1150 ml  Net 280 ml   Filed Weights   06/11/20 1137  Weight: 99.8 kg    Examination:  General:  Pleasantly resting in bed, No acute distress.  Alert to person place and general situation HEENT:  Normocephalic atraumatic.  Sclerae nonicteric, noninjected.  Extraocular movements intact bilaterally. Neck:  Without mass or deformity.  Trachea is midline. Lungs:  Clear to auscultate bilaterally without rhonchi, wheeze, or rales. Heart:  Regular rate and rhythm.  Without murmurs, rubs, or gallops. Abdomen:  Soft, nontender, nondistended.  Without guarding or rebound. Extremities: Left lower extremity bandage clean dry intact, 2+ pitting edema left  lower extremity to the knee.  Otherwise without cyanosis, clubbing, or obvious deformity. Vascular:  Dorsalis pedis and posterior tibial pulses palpable bilaterally. Skin:  Warm and dry, no erythema, no ulcerations.   Data Reviewed: I have personally reviewed following labs and imaging studies  CBC: Recent Labs  Lab 06/11/20 1240 06/11/20 2307 06/12/20 0435  WBC 13.1* 11.4* 12.8*  NEUTROABS 9.1*  --   --   HGB 7.9* 7.1* 7.1*  HCT 26.1* 24.0* 23.2*  MCV 89.7 89.9 91.0  PLT 246 210 161   Basic Metabolic Panel: Recent Labs  Lab 06/11/20 1240 06/11/20 2307 06/12/20 0435  NA 143  --  145  K 4.2  --  4.3  CL 101  --  103  CO2 32  --  31  GLUCOSE 245*  --  199*  BUN 51*  --  46*  CREATININE 2.90* 2.89* 3.00*  CALCIUM 8.3*  --  8.1*   GFR: Estimated Creatinine Clearance: 19.2 mL/min (A) (by C-G formula based on SCr of 3 mg/dL (H)). Liver Function Tests: Recent Labs  Lab 06/11/20 1240  AST 16  ALT 15  ALKPHOS 80  BILITOT 0.6  PROT 6.0*  ALBUMIN 3.0*   No results for input(s): LIPASE, AMYLASE in the last 168 hours. No results for  input(s): AMMONIA in the last 168 hours. Coagulation Profile: Recent Labs  Lab 06/11/20 1240  INR 1.0   Cardiac Enzymes: No results for input(s): CKTOTAL, CKMB, CKMBINDEX, TROPONINI in the last 168 hours. BNP (last 3 results) No results for input(s): PROBNP in the last 8760 hours. HbA1C: Recent Labs    06/12/20 0435  HGBA1C 8.1*   CBG: Recent Labs  Lab 06/11/20 1559 06/11/20 1919 06/11/20 2203 06/12/20 0745 06/12/20 1153  GLUCAP 199* 177* 168* 210* 275*   Lipid Profile: No results for input(s): CHOL, HDL, LDLCALC, TRIG, CHOLHDL, LDLDIRECT in the last 72 hours. Thyroid Function Tests: No results for input(s): TSH, T4TOTAL, FREET4, T3FREE, THYROIDAB in the last 72 hours. Anemia Panel: No results for input(s): VITAMINB12, FOLATE, FERRITIN, TIBC, IRON, RETICCTPCT in the last 72 hours. Sepsis Labs: No results for input(s):  PROCALCITON, LATICACIDVEN in the last 168 hours.  Recent Results (from the past 240 hour(s))  SARS Coronavirus 2 by RT PCR (hospital order, performed in [facility] hospital lab) Nasopharyngeal Nasopharyngeal Swab     Status: None   Collection Time: 06/11/20  1:46 PM   Specimen: Nasopharyngeal Swab  Result Value Ref Range Status   SARS Coronavirus 2 NEGATIVE NEGATIVE Final    Comment: (NOTE) SARS-CoV-2 target nucleic acids are NOT DETECTED.  The SARS-CoV-2 RNA is generally detectable in upper and lower respiratory specimens during the acute phase of infection. The lowest concentration of SARS-CoV-2 viral copies this assay can detect is 250 copies / mL. A negative result does not preclude SARS-CoV-2 infection and should not be used as the sole basis for treatment or other patient management decisions.  A negative result may occur with improper specimen collection / handling, submission of specimen other than nasopharyngeal swab, presence of viral mutation(s) within the areas targeted by this assay, and inadequate number of viral copies (<250 copies / mL). A negative result must be combined with clinical observations, patient history, and epidemiological information.  Fact Sheet for Patients:   StrictlyIdeas.no  Fact Sheet for Healthcare Providers: BankingDealers.co.za  This test is not yet approved or  cleared by the [place] FDA and has been authorized for detection and/or diagnosis of SARS-CoV-2 by FDA under an Emergency Use Authorization (EUA).  This EUA will remain in effect (meaning this test can be used) for the duration of the COVID-19 declaration under Section 564(b)(1) of the Act, 21 U.S.C. section 360bbb-3(b)(1), unless the authorization is terminated or revoked sooner.  Performed at [facility], [address] [place], [place] [address]      Radiology Studies: DG Chest 1 View  Result Date: 06/11/2020 CLINICAL  DATA:  Pain following fall EXAM: CHEST  1 VIEW COMPARISON:  Apr 22, 2020 FINDINGS: There is cardiomegaly with pulmonary venous hypertension. There is mild interstitial edema. No airspace opacity. No pneumothorax. No adenopathy. No bone lesions. IMPRESSION: Cardiomegaly with pulmonary vascular congestion. There is a degree of interstitial edema. Appearance is felt to be indicative of a degree of congestive heart failure. No consolidation. No pneumothorax. Electronically Signed   By: Lowella Grip III M.D.   On: 06/11/2020 13:18   DG Pelvis 1-2 Views  Result Date: 06/11/2020 CLINICAL DATA:  Pain following fall EXAM: PELVIS - 1-2 VIEW COMPARISON:  None. FINDINGS: There is no evidence of pelvic fracture or dislocation. There is slight symmetric narrowing of each hip joint. No erosive change. IMPRESSION: Slight symmetric narrowing of each hip joint. No fracture or dislocation. Electronically Signed   By: Lowella Grip III  M.D.   On: 06/11/2020 13:17   DG Tibia/Fibula Left  Result Date: 06/11/2020 CLINICAL DATA:  Distal tibial and fibular fractures. EXAM: DG C-ARM 1-60 MIN; LEFT TIBIA AND FIBULA - 2 VIEW COMPARISON:  Films from earlier in the same day. FLUOROSCOPY TIME:  Fluoroscopy Time:  1 minutes 16 seconds Radiation Exposure Index (if provided by the fluoroscopic device): 1.94 mGy Number of Acquired Spot Images: 8 FINDINGS: There is a fixation sideplate noted along the distal tibia with multiple fixation screws. Small medullary rod is noted within the distal fibula. Fracture fragments are in near anatomic alignment. Proximal fibular fracture is not appreciated on this exam. IMPRESSION: Status post ORIF of distal tibial and fibular fractures. Electronically Signed   By: Inez Catalina M.D.   On: 06/11/2020 19:51   DG Ankle Complete Left  Result Date: 06/11/2020 CLINICAL DATA:  Ankle pain after fall EXAM: LEFT FOOT - COMPLETE 3+ VIEW; LEFT ANKLE COMPLETE - 3+ VIEW COMPARISON:  07/21/2019 FINDINGS: Acute  comminuted fracture of the distal left tibial metaphysis with 4 mm of lateral displacement and mild anterior apex angulation. Dominant fracture fragment measures up to 5.0 cm. No definite fracture extension to the tibiotalar joint. Acute minimally displaced transversely oriented fracture of the distal fibular metaphysis just proximal to the level of the distal tibiofibular joint. Ankle mortise remains congruent without widening or dislocation. Nondisplaced fractures of the fourth and fifth metatarsal necks. Bony alignment of the left foot is maintained other than pes cavus alignment. There is prominent soft tissue swelling of the dorsal foot, ankle, and lower leg. IMPRESSION: 1. Acute comminuted mildly displaced and angulated fracture of the distal left tibial metaphysis. 2. Acute minimally displaced fracture of the distal fibular metaphysis. 3. Nondisplaced fractures of the fourth and fifth metatarsal necks. Electronically Signed   By: Davina Poke D.O.   On: 06/11/2020 12:10   CT HEAD WO CONTRAST  Result Date: 06/11/2020 CLINICAL DATA:  Status post fall. EXAM: CT HEAD WITHOUT CONTRAST TECHNIQUE: Contiguous axial images were obtained from the base of the skull through the vertex without intravenous contrast. COMPARISON:  Apr 17, 2020 FINDINGS: Brain: There is mild cerebral atrophy with widening of the extra-axial spaces and ventricular dilatation. There are areas of decreased attenuation within the white matter tracts of the supratentorial brain, consistent with microvascular disease changes. Small chronic bilateral lacunar infarcts are seen. Vascular: No hyperdense vessel or unexpected calcification. Skull: Normal. Negative for fracture or focal lesion. Sinuses/Orbits: No acute finding. Other: None. IMPRESSION: 1. Generalized cerebral atrophy. 2. Small chronic bilateral lacunar infarcts. 3. No acute intracranial abnormality. Electronically Signed   By: Virgina Norfolk M.D.   On: 06/11/2020 22:56   DG  Foot Complete Left  Result Date: 06/11/2020 CLINICAL DATA:  Ankle pain after fall EXAM: LEFT FOOT - COMPLETE 3+ VIEW; LEFT ANKLE COMPLETE - 3+ VIEW COMPARISON:  07/21/2019 FINDINGS: Acute comminuted fracture of the distal left tibial metaphysis with 4 mm of lateral displacement and mild anterior apex angulation. Dominant fracture fragment measures up to 5.0 cm. No definite fracture extension to the tibiotalar joint. Acute minimally displaced transversely oriented fracture of the distal fibular metaphysis just proximal to the level of the distal tibiofibular joint. Ankle mortise remains congruent without widening or dislocation. Nondisplaced fractures of the fourth and fifth metatarsal necks. Bony alignment of the left foot is maintained other than pes cavus alignment. There is prominent soft tissue swelling of the dorsal foot, ankle, and lower leg. IMPRESSION: 1. Acute comminuted mildly displaced  and angulated fracture of the distal left tibial metaphysis. 2. Acute minimally displaced fracture of the distal fibular metaphysis. 3. Nondisplaced fractures of the fourth and fifth metatarsal necks. Electronically Signed   By: Davina Poke D.O.   On: 06/11/2020 12:10   DG C-Arm 1-60 Min  Result Date: 06/11/2020 CLINICAL DATA:  Distal tibial and fibular fractures. EXAM: DG C-ARM 1-60 MIN; LEFT TIBIA AND FIBULA - 2 VIEW COMPARISON:  Films from earlier in the same day. FLUOROSCOPY TIME:  Fluoroscopy Time:  1 minutes 16 seconds Radiation Exposure Index (if provided by the fluoroscopic device): 1.94 mGy Number of Acquired Spot Images: 8 FINDINGS: There is a fixation sideplate noted along the distal tibia with multiple fixation screws. Small medullary rod is noted within the distal fibula. Fracture fragments are in near anatomic alignment. Proximal fibular fracture is not appreciated on this exam. IMPRESSION: Status post ORIF of distal tibial and fibular fractures. Electronically Signed   By: Inez Catalina M.D.   On:  06/11/2020 19:51   DG FEMUR MIN 2 VIEWS LEFT  Result Date: 06/11/2020 CLINICAL DATA:  Fall. EXAM: LEFT FEMUR 2 VIEWS COMPARISON:  07/20/2019. FINDINGS: Slightly angulated fracture of the proximal fibula noted. This lies slightly superior to an old left fibular fracture. No other focal abnormality identified. Tibial plateau is intact. Diffuse osteopenia and mild tricompartment degenerative change. IMPRESSION: Slightly angulated fracture of the proximal fibula is noted. This lies slightly superior to an old left fibular fracture. No other acute abnormality identified. Electronically Signed   By: Marcello Moores  Register   On: 06/11/2020 13:18        Scheduled Meds: . sodium chloride   Intravenous Once  . sodium chloride   Intravenous Once  . aspirin EC  81 mg Oral Q breakfast  . atorvastatin  20 mg Oral Daily  . docusate sodium  100 mg Oral BID  . enoxaparin (LOVENOX) injection  30 mg Subcutaneous Q24H  . escitalopram  10 mg Oral q morning - 10a  . fludrocortisone  200 mcg Oral Daily  . hydrALAZINE  25 mg Oral TID  . insulin aspart  0-5 Units Subcutaneous QHS  . insulin aspart  0-9 Units Subcutaneous TID WC  . insulin aspart  2 Units Subcutaneous TID WC  . melatonin  5 mg Oral QHS  . metoprolol succinate  100 mg Oral q morning - 10a  . midodrine  10 mg Oral TID WC  . multivitamin with minerals  1 tablet Oral Daily  . potassium chloride SA  40 mEq Oral Daily  . pregabalin  75 mg Oral BID  . QUEtiapine  25 mg Oral QHS  . senna  1 tablet Oral BID  . sodium chloride flush  3 mL Intravenous Q12H   Continuous Infusions: . sodium chloride    .  ceFAZolin (ANCEF) IV    . lactated ringers 50 mL/hr at 06/12/20 1042  . methocarbamol (ROBAXIN) IV       LOS: 1 day   Time spent: 40 min  Little Ishikawa, DO [facility]  If 7PM-7AM, please contact night-coverage [email or web address]  06/12/2020, 4:17 PM

## 2020-06-12 NOTE — Plan of Care (Signed)
  Problem: Education: Goal: Knowledge of General Education information will improve Description: Including pain rating scale, medication(s)/side effects and non-pharmacologic comfort measures Outcome: Progressing   Problem: Health Behavior/Discharge Planning: Goal: Ability to manage health-related needs will improve Outcome: Progressing   Problem: Coping: Goal: Level of anxiety will decrease Outcome: Progressing   Problem: Elimination: Goal: Will not experience complications related to bowel motility Outcome: Progressing   Problem: Pain Managment: Goal: General experience of comfort will improve Outcome: Progressing   Problem: Safety: Goal: Ability to remain free from injury will improve Outcome: Progressing

## 2020-06-12 NOTE — Plan of Care (Signed)

## 2020-06-12 NOTE — Evaluation (Signed)
Occupational Therapy Evaluation Patient Details Name: Victoria Winters MRN: [id number] DOB: Jul 11, 1951 Today's Date: 06/12/2020    History of Present Illness Pt is a 69 y.o. F with significant PMH of diabetes, chronic kidney disease, stroke, memory loss who presents after a fall with a left distal tibia and fibula lateral malleolus fracture now s/p ORIF 06/11/2020.   Clinical Impression   Pt was ambulated with a RW and could perform ADL independently, but was assisted for IADL. Pt initially more confused, but after sitting at EOB was able to state place and month with minimal prompting. Pt experiencing increased pain, transfer not attempted, pt likely to struggle with NWB precaution on L LE. Pt needs min to total assist for ADL. Demonstrating significant myoclonus B UEs interfering with ability to drink initially. Pt reports she had these type movements prior to hospitalization.Pt requiring 2L 02 to maintain SP02 >90%.  Recommend SNF for further rehab.     Follow Up Recommendations  SNF;Supervision/Assistance - 24 hour    Equipment Recommendations   (defer to next venue)    Recommendations for Other Services       Precautions / Restrictions Precautions Precautions: Fall Restrictions Weight Bearing Restrictions: Yes LLE Weight Bearing: Non weight bearing      Mobility Bed Mobility Overal bed mobility: Needs Assistance Bed Mobility: Supine to Sit;Sit to Supine;Rolling Rolling: Mod assist;Max assist   Supine to sit: Max assist;+2 for physical assistance Sit to supine: Mod assist   General bed mobility comments: MaxA + 2 to transition to edge of bed, with assist for BLE's and trunk. ModA for return to supine with assist for BLE's. Rolling to right and left for peri care and bed linen change with mod-maxA (modA to right, maxA to left)   Transfers                 General transfer comment: deferred    Balance Overall balance assessment: Needs assistance Sitting-balance support:  Feet unsupported;No upper extremity supported Sitting balance-Leahy Scale: Fair Sitting balance - Comments: Supervision for safety sitting EOB                                   ADL either performed or assessed with clinical judgement   ADL Overall ADL's : Needs assistance/impaired Eating/Feeding: Minimal assistance;Sitting;Bed level   Grooming: Wash/dry hands;Wash/dry face;Bed level;Set up   Upper Body Bathing: Moderate assistance;Sitting   Lower Body Bathing: Total assistance;+2 for physical assistance;Bed level   Upper Body Dressing : Moderate assistance;Sitting   Lower Body Dressing: Total assistance;+2 for physical assistance;Bed level       Toileting- Clothing Manipulation and Hygiene: Total assistance;Bed level               Vision Patient Visual Report: No change from baseline       Perception     Praxis      Pertinent Vitals/Pain Pain Assessment: Faces Faces Pain Scale: Hurts whole lot Pain Location: LLE with movement Pain Descriptors / Indicators: Grimacing;Guarding;Sharp Pain Intervention(s): Monitored during session;RN gave pain meds during session;Repositioned     Hand Dominance Left   Extremity/Trunk Assessment Upper Extremity Assessment Upper Extremity Assessment: Generalized weakness (with dystonia)   Lower Extremity Assessment Lower Extremity Assessment: Generalized weakness;LLE deficits/detail LLE Deficits / Details: Nerve block still in effect, pt unable to wiggle toes.   Cervical / Trunk Assessment Cervical / Trunk Assessment: Other exceptions Cervical / Trunk Exceptions: obesity  Communication Communication Communication: No difficulties   Cognition Arousal/Alertness: Awake/alert Behavior During Therapy: WFL for tasks assessed/performed Overall Cognitive Status: History of cognitive impairments - at baseline                                 General Comments: Pt with history of memory loss. Initially  when asking orientation questions, pt repeatedly asking for water. Towards end of session, able to correctly state place, not oriented to time. Follows 1 step commands > 75% of the times, responds accurately to yes/no questions.   General Comments       Exercises     Shoulder Instructions      Home Living Family/patient expects to be discharged to:: Skilled nursing facility Living Arrangements: Other relatives (sister)                               Additional Comments: PTA, living with her sister      Prior Functioning/Environment Level of Independence: Needs assistance  Gait / Transfers Assistance Needed: household ambulator using RW ADL's / Homemaking Assistance Needed: independent ADL's, requires assist IADL's            OT Problem List: Decreased strength;Decreased activity tolerance;Impaired balance (sitting and/or standing);Decreased knowledge of use of DME or AE;Pain      OT Treatment/Interventions: Self-care/ADL training;DME and/or AE instruction;Balance training;Patient/family education;Therapeutic activities    OT Goals(Current goals can be found in the care plan section) Acute Rehab OT Goals Patient Stated Goal: agrees she needs rehab OT Goal Formulation: With patient Time For Goal Achievement: 06/26/20 Potential to Achieve Goals: Good ADL Goals Pt Will Perform Eating: with set-up;sitting (with AE as needed) Pt Will Perform Grooming: with supervision;sitting Pt Will Perform Upper Body Bathing: with supervision;sitting Pt Will Perform Upper Body Dressing: with supervision;sitting Pt Will Transfer to Toilet: with +2 assist;with mod assist;bedside commode Additional ADL Goal #1: Pt will perform bed mobility with min assist in preparation for ADL. Additional ADL Goal #2: Pt will adhere to NWB on L LE with minimal verbal cues during functional transfers.  OT Frequency: Min 2X/week   Barriers to D/C:            Co-evaluation PT/OT/SLP  Co-Evaluation/Treatment: Yes Reason for Co-Treatment: Necessary to address cognition/behavior during functional activity;For patient/therapist safety PT goals addressed during session: Mobility/safety with mobility OT goals addressed during session: ADL's and self-care      AM-PAC OT "6 Clicks" Daily Activity     Outcome Measure Help from another person eating meals?: A Little Help from another person taking care of personal grooming?: A Little Help from another person toileting, which includes using toliet, bedpan, or urinal?: Total Help from another person bathing (including washing, rinsing, drying)?: A Lot Help from another person to put on and taking off regular upper body clothing?: A Lot Help from another person to put on and taking off regular lower body clothing?: Total 6 Click Score: 12   End of Session Equipment Utilized During Treatment: Oxygen Nurse Communication: Mobility status (aware breakfast was ordered)  Activity Tolerance: Patient limited by pain Patient left: in bed;with call bell/phone within reach;with bed alarm set;with nursing/sitter in room  OT Visit Diagnosis: Unsteadiness on feet (R26.81);Other abnormalities of gait and mobility (R26.89);Pain;Other symptoms and signs involving cognitive function;Muscle weakness (generalized) (M62.81)  Time: 9758-8325 OT Time Calculation (min): 37 min Charges:  OT General Charges $OT Visit: 1 Visit OT Evaluation $OT Eval Moderate Complexity: 1 Mod  Victoria Winters, OTR/L Acute Rehabilitation Services Pager: [phone] Office: [phone]  Victoria Winters 06/12/2020, 2:00 PM

## 2020-06-12 NOTE — Evaluation (Signed)
Physical Therapy Evaluation Patient Details Name: Victoria Winters MRN: [id number] DOB: [birth year] Today's Date: 06/12/2020   History of Present Illness  Pt is a 69 y.o. F with significant PMH of diabetes, chronic kidney disease, stroke, memory loss who presents after a fall with a left distal tibia and fibula lateral malleolus fracture now s/p ORIF 06/11/2020.  Clinical Impression  Prior to admission, pt reports she was living with her sister, using a walker for ambulation and independent with ADL's. On PT evaluation, pt presents with decreased functional mobility secondary to weakness, LLE pain, balance deficits, and cognitive impairments. Requiring two person mod-maximal assist for bed mobility. Transfer deferred due to pain (RN gave pain medication during session). Pt with urinary incontinence so returned to supine and rolled to change and place bed linens. Recommending SNF to maximize functional mobility and decrease caregiver burden.     Follow Up Recommendations SNF;Supervision/Assistance - 24 hour    Equipment Recommendations  Other (comment) (defer)    Recommendations for Other Services       Precautions / Restrictions Precautions Precautions: Fall Restrictions Weight Bearing Restrictions: Yes LLE Weight Bearing: Non weight bearing      Mobility  Bed Mobility Overal bed mobility: Needs Assistance Bed Mobility: Supine to Sit;Sit to Supine;Rolling Rolling: Mod assist;Max assist   Supine to sit: Max assist;+2 for physical assistance Sit to supine: Mod assist   General bed mobility comments: MaxA + 2 to transition to edge of bed, with assist for BLE's and trunk. ModA for return to supine with assist for BLE's. Rolling to right and left for peri care and bed linen change with mod-maxA (modA to right, maxA to left)   Transfers                 General transfer comment: deferred  Ambulation/Gait                Stairs            Wheelchair Mobility     Modified Rankin (Stroke Patients Only)       Balance Overall balance assessment: Needs assistance Sitting-balance support: Feet unsupported;No upper extremity supported Sitting balance-Leahy Scale: Fair Sitting balance - Comments: Supervision for safety sitting EOB                                     Pertinent Vitals/Pain Pain Assessment: Faces Faces Pain Scale: Hurts whole lot Pain Location: LLE with movement Pain Descriptors / Indicators: Grimacing;Guarding;Sharp Pain Intervention(s): Limited activity within patient's tolerance;Monitored during session;RN gave pain meds during session    [place] expects to be discharged to:: Skilled nursing facility                 Additional Comments: PTA, living with her sister    Prior Function Level of Independence: Needs assistance   Gait / Transfers Assistance Needed: household ambulator using RW  ADL's / Homemaking Assistance Needed: independent ADL's, requires assist IADL's        Hand Dominance   Dominant Hand: Left    Extremity/Trunk Assessment   Upper Extremity Assessment Upper Extremity Assessment: Generalized weakness    Lower Extremity Assessment Lower Extremity Assessment: Generalized weakness;LLE deficits/detail LLE Deficits / Details: Nerve block still in effect, pt unable to wiggle toes.       Communication   Communication: No difficulties  Cognition Arousal/Alertness: Awake/alert Behavior During Therapy: WFL for tasks assessed/performed Overall  Cognitive Status: History of cognitive impairments - at baseline                                 General Comments: Pt with history of memory loss. Initially when asking orientation questions, pt repeatedly asking for water. Towards end of session, able to correctly state place, not oriented to time. Follows 1 step commands > 75% of the times, responds accurately to yes/no questions.      General Comments       Exercises     Assessment/Plan    PT Assessment Patient needs continued PT services  PT Problem List Decreased strength;Decreased range of motion;Decreased activity tolerance;Decreased balance;Decreased mobility;Decreased cognition;Decreased safety awareness;Impaired sensation;Pain       PT Treatment Interventions DME instruction;Functional mobility training;Therapeutic activities;Therapeutic exercise;Balance training;Patient/family education;Wheelchair mobility training    PT Goals (Current goals can be found in the Care Plan section)  Acute Rehab PT Goals Patient Stated Goal: less pain PT Goal Formulation: With patient Time For Goal Achievement: 06/26/20 Potential to Achieve Goals: Fair    Frequency Min 3X/week   Barriers to discharge        Co-evaluation PT/OT/SLP Co-Evaluation/Treatment: Yes Reason for Co-Treatment: Necessary to address cognition/behavior during functional activity;Complexity of the patient's impairments (multi-system involvement);For patient/therapist safety;To address functional/ADL transfers PT goals addressed during session: Mobility/safety with mobility         AM-PAC PT "6 Clicks" Mobility  Outcome Measure Help needed turning from your back to your side while in a flat bed without using bedrails?: A Lot Help needed moving from lying on your back to sitting on the side of a flat bed without using bedrails?: Total Help needed moving to and from a bed to a chair (including a wheelchair)?: Total Help needed standing up from a chair using your arms (e.g., wheelchair or bedside chair)?: Total Help needed to walk in hospital room?: Total Help needed climbing 3-5 steps with a railing? : Total 6 Click Score: 7    End of Session Equipment Utilized During Treatment: Oxygen Activity Tolerance: Patient limited by pain Patient left: in bed;with call bell/phone within reach;with bed alarm set Nurse Communication: Mobility status PT Visit Diagnosis:  Pain;Other abnormalities of gait and mobility (R26.89);Muscle weakness (generalized) (M62.81) Pain - Right/Left: Left Pain - part of body: Leg    Time: 1005-1036 PT Time Calculation (min) (ACUTE ONLY): 31 min   Charges:   PT Evaluation $PT Eval Moderate Complexity: 1 Mod            Wyona Almas, PT, DPT Acute Rehabilitation Services Pager [phone] Office [phone]   Deno Etienne 06/12/2020, 11:41 AM

## 2020-06-12 NOTE — Progress Notes (Signed)
The patient appears very drowsy but answers all questions.  When trying to feed herself, the patient has spastic type movements with her hands.  The patient's family reports that she does have some tremors but this seems worse.  Dr Avon Gully was notified and he returned the call and encouraged the nurse to explain that it is mostly likely the results from anesthesia, new medications and on going medical issues.

## 2020-06-12 NOTE — TOC Initial Note (Signed)
Transition of Care [facility]) - Initial/Assessment Note    Patient Details  Name: Victoria Winters MRN: [id number] Date of Birth: [birth year]  [facility]) CM/SW Contact:    Curlene Labrum, RN Phone Number: 06/12/2020, 4:37 PM  Clinical Narrative:                 Case Management met with the patient and called the daughter on the phone to discuss SNF placement.  Patient lives with the sister who is disabled and can not care for the patient after her S/P ORIF for Left distal tibia/fibula fx and repair.  The patient's sister would like a SNF workup for Rehab in [place] area.  Will continue to follow and call the sister tomorrow for choice of facility.  Expected Discharge Plan: Skilled Nursing Facility Barriers to Discharge: Continued Medical Work up   Patient Goals and CMS Choice Patient states their goals for this hospitalization and ongoing recovery are:: Patient's sister would like the patient to go to rehab for PT/OT.  Patient with memory issues and CVA. CMS Medicare.gov Compare Post Acute Care list provided to:: Patient Represenative (must comment) (sister) Choice offered to / list presented to : [place] / Guardian  Expected Discharge Plan and Services Expected Discharge Plan: [place]   Discharge Planning Services: CM Consult Post Acute Care Choice: [place] Living arrangements for the past 2 months: Single Family Home                                      Prior Living Arrangements/Services Living arrangements for the past 2 months: Single Family Home Lives with:: Relatives Patient language and need for interpreter reviewed:: Yes Do you feel safe going back to the place where you live?: Yes          Current home services: DME (currently has rolling walker) Criminal Activity/Legal Involvement Pertinent to Current Situation/Hospitalization: No - Comment as needed  Activities of Daily Living Home Assistive  Devices/Equipment: Walker (specify type) ADL Screening (condition at time of admission) Patient's cognitive ability adequate to safely complete daily activities?: Yes Is the patient deaf or have difficulty hearing?: No Does the patient have difficulty seeing, even when wearing glasses/contacts?: No Does the patient have difficulty concentrating, remembering, or making decisions?: Yes Patient able to express need for assistance with ADLs?: Yes Does the patient have difficulty dressing or bathing?: Yes Independently performs ADLs?: Yes (appropriate for developmental age) Does the patient have difficulty walking or climbing stairs?: Yes Weakness of Legs: Both Weakness of Arms/Hands: None  Permission Sought/Granted Permission sought to share information with : Case Manager Permission granted to share information with : Yes, Verbal Permission Granted     Permission granted to share info w AGENCY: SNf facility  Permission granted to share info w Relationship: sister     Emotional Assessment Appearance:: Appears stated age Attitude/Demeanor/Rapport: Inconsistent Affect (typically observed): Accepting Orientation: : Oriented to Self, Oriented to Situation, Oriented to Place Alcohol / Substance Use: Not Applicable Psych Involvement: No (comment)  Admission diagnosis:  Fall [W19.XXXA] Tibia/fibula fracture, left, closed, initial encounter [Q76.195K, S82.402A] Patient Active Problem List   Diagnosis Date Noted  . AKI (acute kidney injury) ([place]) 06/12/2020  . Tibia/fibula fracture, left, closed, initial encounter 06/11/2020  . (HFpEF) heart failure with preserved ejection fraction - Grade 2 diastolic dysfunction 93/26/7124  . Acute on chronic heart failure with preserved ejection  fraction (HFpEF) --grade 2 diastolic dysfunction 38/18/2993  . Acute respiratory failure with hypoxia ([place]) 04/23/2020  . Heart failure ([place]) 04/22/2020  . Acute and chronic respiratory failure with hypoxia ([place])  04/17/2020  . DNR (do not resuscitate) 04/17/2020  . Leukocytosis 04/17/2020  . Anemia in chronic kidney disease (CKD) 04/17/2020  . Closed fracture of distal end of right fibula with routine healing 11/23/2019  . Palliative care by specialist   . Goals of care, counseling/discussion   . History of CVA (cerebrovascular accident)   . Acute encephalopathy 07/21/2019  . Fall 07/20/2019  . CKD (chronic kidney disease) IV 07/20/2019  . Brain aneurysm 07/20/2019  . DM (diabetes mellitus) ([place]) 07/20/2019  . Bilateral fibular fractures 07/20/2019  . CVA (cerebral vascular accident) ([place]) 07/20/2019  . Hallucination 07/20/2019   PCP:  Alfonse Flavors, MD Pharmacy:   [facility], [place] [address] [place] [address] Phone: [phone] Fax: [phone]     Social Determinants of Health (SDOH) Interventions    Readmission Risk Interventions Readmission Risk Prevention Plan 06/12/2020 04/23/2020 07/28/2019  Transportation Screening Complete Complete -  PCP or Specialist Appt within 3-5 Days - Complete Not Complete  Not Complete comments - - Pt going to SNF rehab where the SNF MD will follow  Medication Review (RN CM) - - -  HRI or [place] - Complete Not Complete  HRI or Home Care Consult comments - - Pt going to SNF rehab  Social Work Consult for [place] Planning/Counseling - Complete Complete  Palliative Care Screening - Not Complete Not Applicable  Medication Review ([place]) Complete Complete Complete  PCP or Specialist appointment within 3-5 days of discharge Complete - -  [place] or Home Care Consult Complete - -  SW Recovery Care/Counseling Consult Complete - -  Palliative Care Screening Complete - -  Skilled Nursing Facility Complete - -

## 2020-06-12 NOTE — Discharge Instructions (Signed)
Wylene Simmer, MD [facility]  Please read the following information regarding your care after surgery.  Medications  You only need a prescription for the narcotic pain medicine (ex. oxycodone, Percocet, Norco).  All of the other medicines listed below are available over the counter. X Aleve 2 pills twice a day for the first 3 days after surgery. X acetominophen (Tylenol) 650 mg every 4-6 hours as you need for minor to moderate pain X oxycodone as prescribed for severe pain  Narcotic pain medicine (ex. oxycodone, Percocet, Vicodin) will cause constipation.  To prevent this problem, take the following medicines while you are taking any pain medicine. X docusate sodium (Colace) 100 mg twice a day X senna (Senokot) 2 tablets twice a day  X To help prevent blood clots, take a baby aspirin (81 mg) twice a day for six weeks after surgery.  You should also get up every hour while you are awake to move around.    Weight Bearing X Do not bear any weight on the operated leg or foot.  Cast / Splint / Dressing X Keep your splint, cast or dressing clean and dry.  Don't put anything (coat hanger, pencil, etc) down inside of it.  If it gets damp, use a hair dryer on the cool setting to dry it.  If it gets soaked, call the office to schedule an appointment for a cast change.    After your dressing, cast or splint is removed; you may shower, but do not soak or scrub the wound.  Allow the water to run over it, and then gently pat it dry.  Swelling It is normal for you to have swelling where you had surgery.  To reduce swelling and pain, keep your toes above your nose for at least 3 days after surgery.  It may be necessary to keep your foot or leg elevated for several weeks.  If it hurts, it should be elevated.  Follow Up Call my office at [phone] when you are discharged from the hospital or surgery center to schedule an appointment to be seen two weeks after surgery.  Call my office at [phone]  if you develop a fever >101.5 F, nausea, vomiting, bleeding from the surgical site or severe pain.

## 2020-06-12 NOTE — Progress Notes (Addendum)
Subjective: 1 Day Post-Op Procedure(s) (LRB): OPEN REDUCTION INTERNAL FIXATION LEFT TIBIA (Left)  Patient reports pain as mild to moderate.  Resting comfortably.  Denies fever, chills, N/V, CP, SOB.  Objective:   VITALS:  Temp:  [97.5 F (36.4 C)-98.3 F (36.8 C)] 98.1 F (36.7 C) (07/07 0746) Pulse Rate:  [58-71] 71 (07/07 0746) Resp:  [14-24] 15 (07/07 0746) BP: (114-209)/(48-85) 161/65 (07/07 0746) SpO2:  [93 %-100 %] 99 % (07/07 0746) Weight:  [99.8 kg] 99.8 kg (07/06 1137)  General: WDWN patient in NAD. Psych:  Appropriate mood and affect. Neuro:  A&O x 1, Moving all extremities, sensation intact to light touch HEENT:  EOMs intact Chest:  Even non-labored respirations Skin:  Incision C/D/I, no rashes or lesions Extremities: warm/dry, no visible edema, erythema or echymosis.  No lymphadenopathy. Pulses: Popliteus 2+ MSK:  ROM: EHL/FHL intact, MMT: able to perform quad set    LABS Recent Labs    06/11/20 1240 06/11/20 1240 06/11/20 2307 06/12/20 0435  HGB 7.9*  --  7.1* 7.1*  WBC 13.1*   < > 11.4* 12.8*  PLT 246   < > 210 191   < > = values in this interval not displayed.   Recent Labs    06/11/20 1240 06/11/20 1240 06/11/20 2307 06/12/20 0435  NA 143  --   --  145  K 4.2  --   --  4.3  CL 101  --   --  103  CO2 32  --   --  31  BUN 51*  --   --  46*  CREATININE 2.90*   < > 2.89* 3.00*  GLUCOSE 245*  --   --  199*   < > = values in this interval not displayed.   Recent Labs    06/11/20 1240  INR 1.0     Assessment/Plan: 1 Day Post-Op Procedure(s) (LRB): OPEN REDUCTION INTERNAL FIXATION LEFT TIBIA (Left)  -NWB L LE -Up with therapy -Disp: SNF -After searching the PMP Aware database I have provided the patient a Rx for oxycodone upon D/C. -DVT ppx:  Lovenox in house; 81mg  ASA BID upon D/C -D/C scripts on chart -Plan for 2 week outpatient post-op visit with Dr. Doran Durand.  Mechele Claude PA-C [facility] Office:  [phone]

## 2020-06-13 LAB — CBC
HCT: 19.5 % — ABNORMAL LOW (ref 36.0–46.0)
Hemoglobin: 5.9 g/dL — CL (ref 12.0–15.0)
MCH: 27.7 pg (ref 26.0–34.0)
MCHC: 30.3 g/dL (ref 30.0–36.0)
MCV: 91.5 fL (ref 80.0–100.0)
Platelets: 162 10*3/uL (ref 150–400)
RBC: 2.13 MIL/uL — ABNORMAL LOW (ref 3.87–5.11)
RDW: 16.3 % — ABNORMAL HIGH (ref 11.5–15.5)
WBC: 9.4 10*3/uL (ref 4.0–10.5)
nRBC: 0 % (ref 0.0–0.2)

## 2020-06-13 LAB — BASIC METABOLIC PANEL
Anion gap: 10 (ref 5–15)
BUN: 43 mg/dL — ABNORMAL HIGH (ref 8–23)
CO2: 29 mmol/L (ref 22–32)
Calcium: 7.8 mg/dL — ABNORMAL LOW (ref 8.9–10.3)
Chloride: 104 mmol/L (ref 98–111)
Creatinine, Ser: 3.16 mg/dL — ABNORMAL HIGH (ref 0.44–1.00)
GFR calc Af Amer: 17 mL/min — ABNORMAL LOW (ref >60)
GFR calc non Af Amer: 14 mL/min — ABNORMAL LOW (ref >60)
Glucose, Bld: 158 mg/dL — ABNORMAL HIGH (ref 70–99)
Potassium: 4.4 mmol/L (ref 3.5–5.1)
Sodium: 143 mmol/L (ref 135–145)

## 2020-06-13 LAB — GLUCOSE, CAPILLARY
Glucose-Capillary: 159 mg/dL — ABNORMAL HIGH (ref 70–99)
Glucose-Capillary: 150 mg/dL — ABNORMAL HIGH (ref 70–99)
Glucose-Capillary: 221 mg/dL — ABNORMAL HIGH (ref 70–99)
Glucose-Capillary: 254 mg/dL — ABNORMAL HIGH (ref 70–99)
Glucose-Capillary: 203 mg/dL — ABNORMAL HIGH (ref 70–99)

## 2020-06-13 LAB — HEMOGLOBIN AND HEMATOCRIT, BLOOD
HCT: 27.9 % — ABNORMAL LOW (ref 36.0–46.0)
Hemoglobin: 8.6 g/dL — ABNORMAL LOW (ref 12.0–15.0)

## 2020-06-13 LAB — PREPARE RBC (CROSSMATCH)

## 2020-06-13 MED ORDER — FUROSEMIDE 10 MG/ML IJ SOLN
20.0000 mg | Freq: Once | INTRAMUSCULAR | Status: DC
Start: 2020-06-13 — End: 2020-06-13

## 2020-06-13 MED ORDER — SODIUM CHLORIDE 0.9% IV SOLUTION: Freq: Once | INTRAVENOUS | Status: AC

## 2020-06-13 MED ORDER — FUROSEMIDE 10 MG/ML IJ SOLN
20.0000 mg | Freq: Once | INTRAMUSCULAR | Status: AC
  Administered 2020-06-13: 20 mg via INTRAVENOUS
  Filled 2020-06-13: qty 2

## 2020-06-13 NOTE — Progress Notes (Signed)
CRITICAL VALUE ALERT  Critical Value:  Hemoglobin 5.9  Date & Time Notied:  06/13/2020 0433  Provider Notified: M. Sharlet Salina  Orders Received/Actions taken: Awaiting response

## 2020-06-13 NOTE — Progress Notes (Signed)
Pt was groggy, very sleepy this morning. 2 units of PRBC's completed today.  1200 Pt is more awake.

## 2020-06-13 NOTE — Progress Notes (Signed)
PROGRESS NOTE    Victoria Winters  XHB:[id number] DOB: July 15, 1951 DOA: 06/11/2020 PCP: Alfonse Flavors, MD    Brief Narrative:  69 year old female with history of chronic diastolic heart failure, type 2 diabetes, stage IV chronic kidney disease, cognitive impairment with vascular dementia, presented to the emergency room on 7/6 with fall at home and obvious deformity of the left foot and ankle area.  Patient does have cognitive dysfunction with previous stroke, she lives at home with her sister.  In the emergency room she was hemodynamically stable.  She was found to have left distal tibia, left distal fibula and fourth and fifth metatarsal neck fractures.  Admit to the hospital for ORIF.   Assessment & Plan:   Principal Problem:   Tibia/fibula fracture, left, closed, initial encounter Active Problems:   CKD (chronic kidney disease) IV   Brain aneurysm   DM (diabetes mellitus) ([place])   History of CVA (cerebrovascular accident)   Anemia in chronic kidney disease (CKD)   (HFpEF) heart failure with preserved ejection fraction - Grade 2 diastolic dysfunction   AKI (acute kidney injury) ([place])  Acute comminuted fracture of the distal left tibia and fibula along with fracture of the fourth and fifth metatarsal necks: Status post ORIF left distal tibia, status post ORIF left fibula and lateral malleolus on 7/6. Surgically stable as per surgery. DVT prophylaxis, Lovenox in the house, aspirin 81 mg twice daily upon DC. Nonweightbearing. Work with PT OT, referred to inpatient physical therapy at a skilled nursing facility. Adequate pain medications.  Anemia of chronic kidney disease, acute on chronic anemia with expected perioperative blood loss and hemodilution: Hemoglobin 5.9.  Baseline hemoglobin about 9.  No evidence of bleeding. Receiving 2 units of PRBC transfusion today, recheck hemoglobin tomorrow morning. Since patient has no evidence of active bleeding, will continue Lovenox  prophylaxis.  Acute kidney injury on chronic kidney disease stage IV: Reported baseline creatinine about 2.4.  Creatinine more than 3 today.  Blood transfusion and monitoring.  Diuretics on hold.  Recheck tomorrow morning.  Heart failure with preserved ejection fraction: Euvolemic.  Recent echocardiogram with ejection fraction 65 to 70%.  Type 2 diabetes, uncontrolled with hyperglycemia: On Tradjenta at home.  Continue to hold.  Remains on insulin.  Stable.  Adrenal insufficiency: On midodrine and hydrocortisone that she will continue.  Cognitive impairment, vascular dementia: Patient on multiple medications regimen including Seroquel, Lexapro and also on Lyrica.  Will use carefully along with pain medications to avoid oversedation.  DVT prophylaxis: enoxaparin (LOVENOX) injection 30 mg Start: 06/12/20 0800 SCDs Start: 06/11/20 2049 SCDs Start: 06/11/20 1536   Code Status: DNR Family Communication: None today. Disposition Plan: Status is: Inpatient  Remains inpatient appropriate because:Unsafe d/c plan   Dispo: The patient is from: Home              Anticipated d/c is to: SNF              Anticipated d/c date is: 2 days              Patient currently is not medically stable to d/c.         Consultants:   Orthopedics  Procedures:   ORIF, 7/6  Antimicrobials:   None   Subjective: Patient seen and examined.  Pleasantly confused.  "My sister will take me home" poor historian.  Patient also denies any complaints.  She is not sure why she is in the hospital.  Objective: Vitals:   06/13/20 0736 06/13/20  0900 06/13/20 1053 06/13/20 1157  BP: (!) 154/68 (!) 156/65 (!) 162/59 (!) 156/96  Pulse: 68 71 70 100  Resp: 15 16 16 16   Temp: 98.1 F (36.7 C) 99.3 F (37.4 C) 98.8 F (37.1 C) 99.1 F (37.3 C)  TempSrc: Oral Oral Oral Oral  SpO2: 98% 98% 100% 96%  Weight:      Height:        Intake/Output Summary (Last 24 hours) at 06/13/2020 1314 Last data filed at  06/13/2020 0900 Gross per 24 hour  Intake 1040 ml  Output 300 ml  Net 740 ml   Filed Weights   06/11/20 1137  Weight: 99.8 kg    Examination:  General exam: Appears calm and comfortable, chronically sick looking.  Not in any distress. On room air. Pleasant, slightly impulsive.  Alert oriented x1. Respiratory system: Clear to auscultation. Respiratory effort normal. Cardiovascular system: S1 & S2 heard,  Gastrointestinal system: Abdomen is nondistended, soft and nontender.  Extremities: Symmetric 5 x 5 power. Left leg on below-knee cast, some ecchymosis on the distal exposed toes with intact vasculature and neurovascular status.    Data Reviewed: I have personally reviewed following labs and imaging studies  CBC: Recent Labs  Lab 06/11/20 1240 06/11/20 2307 06/12/20 0435 06/13/20 0346  WBC 13.1* 11.4* 12.8* 9.4  NEUTROABS 9.1*  --   --   --   HGB 7.9* 7.1* 7.1* 5.9*  HCT 26.1* 24.0* 23.2* 19.5*  MCV 89.7 89.9 91.0 91.5  PLT 246 210 191 488   Basic Metabolic Panel: Recent Labs  Lab 06/11/20 1240 06/11/20 2307 06/12/20 0435 06/13/20 0346  NA 143  --  145 143  K 4.2  --  4.3 4.4  CL 101  --  103 104  CO2 32  --  31 29  GLUCOSE 245*  --  199* 158*  BUN 51*  --  46* 43*  CREATININE 2.90* 2.89* 3.00* 3.16*  CALCIUM 8.3*  --  8.1* 7.8*   GFR: Estimated Creatinine Clearance: 18.2 mL/min (A) (by C-G formula based on SCr of 3.16 mg/dL (H)). Liver Function Tests: Recent Labs  Lab 06/11/20 1240  AST 16  ALT 15  ALKPHOS 80  BILITOT 0.6  PROT 6.0*  ALBUMIN 3.0*   No results for input(s): LIPASE, AMYLASE in the last 168 hours. No results for input(s): AMMONIA in the last 168 hours. Coagulation Profile: Recent Labs  Lab 06/11/20 1240  INR 1.0   Cardiac Enzymes: No results for input(s): CKTOTAL, CKMB, CKMBINDEX, TROPONINI in the last 168 hours. BNP (last 3 results) No results for input(s): PROBNP in the last 8760 hours. HbA1C: Recent Labs     06/12/20 0435  HGBA1C 8.1*   CBG: Recent Labs  Lab 06/12/20 1648 06/12/20 1928 06/13/20 0733 06/13/20 0854 06/13/20 1149  GLUCAP 203* 228* 159* 150* 221*   Lipid Profile: No results for input(s): CHOL, HDL, LDLCALC, TRIG, CHOLHDL, LDLDIRECT in the last 72 hours. Thyroid Function Tests: No results for input(s): TSH, T4TOTAL, FREET4, T3FREE, THYROIDAB in the last 72 hours. Anemia Panel: No results for input(s): VITAMINB12, FOLATE, FERRITIN, TIBC, IRON, RETICCTPCT in the last 72 hours. Sepsis Labs: No results for input(s): PROCALCITON, LATICACIDVEN in the last 168 hours.  Recent Results (from the past 240 hour(s))  SARS Coronavirus 2 by RT PCR (hospital order, performed in [facility] hospital lab) Nasopharyngeal Nasopharyngeal Swab     Status: None   Collection Time: 06/11/20  1:46 PM   Specimen: Nasopharyngeal Swab  Result Value Ref Range Status   SARS Coronavirus 2 NEGATIVE NEGATIVE Final    Comment: (NOTE) SARS-CoV-2 target nucleic acids are NOT DETECTED.  The SARS-CoV-2 RNA is generally detectable in upper and lower respiratory specimens during the acute phase of infection. The lowest concentration of SARS-CoV-2 viral copies this assay can detect is 250 copies / mL. A negative result does not preclude SARS-CoV-2 infection and should not be used as the sole basis for treatment or other patient management decisions.  A negative result may occur with improper specimen collection / handling, submission of specimen other than nasopharyngeal swab, presence of viral mutation(s) within the areas targeted by this assay, and inadequate number of viral copies (<250 copies / mL). A negative result must be combined with clinical observations, patient history, and epidemiological information.  Fact Sheet for Patients:   StrictlyIdeas.no  Fact Sheet for Healthcare Providers: BankingDealers.co.za  This test is not yet approved or   cleared by the [place] FDA and has been authorized for detection and/or diagnosis of SARS-CoV-2 by FDA under an Emergency Use Authorization (EUA).  This EUA will remain in effect (meaning this test can be used) for the duration of the COVID-19 declaration under Section 564(b)(1) of the Act, 21 U.S.C. section 360bbb-3(b)(1), unless the authorization is terminated or revoked sooner.  Performed at [facility], [address] [place], [place] [address]          Radiology Studies: DG Tibia/Fibula Left  Result Date: 06/11/2020 CLINICAL DATA:  Distal tibial and fibular fractures. EXAM: DG C-ARM 1-60 MIN; LEFT TIBIA AND FIBULA - 2 VIEW COMPARISON:  Films from earlier in the same day. FLUOROSCOPY TIME:  Fluoroscopy Time:  1 minutes 16 seconds Radiation Exposure Index (if provided by the fluoroscopic device): 1.94 mGy Number of Acquired Spot Images: 8 FINDINGS: There is a fixation sideplate noted along the distal tibia with multiple fixation screws. Small medullary rod is noted within the distal fibula. Fracture fragments are in near anatomic alignment. Proximal fibular fracture is not appreciated on this exam. IMPRESSION: Status post ORIF of distal tibial and fibular fractures. Electronically Signed   By: Inez Catalina M.D.   On: 06/11/2020 19:51   CT HEAD WO CONTRAST  Result Date: 06/11/2020 CLINICAL DATA:  Status post fall. EXAM: CT HEAD WITHOUT CONTRAST TECHNIQUE: Contiguous axial images were obtained from the base of the skull through the vertex without intravenous contrast. COMPARISON:  Apr 17, 2020 FINDINGS: Brain: There is mild cerebral atrophy with widening of the extra-axial spaces and ventricular dilatation. There are areas of decreased attenuation within the white matter tracts of the supratentorial brain, consistent with microvascular disease changes. Small chronic bilateral lacunar infarcts are seen. Vascular: No hyperdense vessel or unexpected calcification. Skull: Normal. Negative  for fracture or focal lesion. Sinuses/Orbits: No acute finding. Other: None. IMPRESSION: 1. Generalized cerebral atrophy. 2. Small chronic bilateral lacunar infarcts. 3. No acute intracranial abnormality. Electronically Signed   By: Virgina Norfolk M.D.   On: 06/11/2020 22:56   DG C-Arm 1-60 Min  Result Date: 06/11/2020 CLINICAL DATA:  Distal tibial and fibular fractures. EXAM: DG C-ARM 1-60 MIN; LEFT TIBIA AND FIBULA - 2 VIEW COMPARISON:  Films from earlier in the same day. FLUOROSCOPY TIME:  Fluoroscopy Time:  1 minutes 16 seconds Radiation Exposure Index (if provided by the fluoroscopic device): 1.94 mGy Number of Acquired Spot Images: 8 FINDINGS: There is a fixation sideplate noted along the distal tibia with multiple fixation screws. Small medullary rod is noted  within the distal fibula. Fracture fragments are in near anatomic alignment. Proximal fibular fracture is not appreciated on this exam. IMPRESSION: Status post ORIF of distal tibial and fibular fractures. Electronically Signed   By: Inez Catalina M.D.   On: 06/11/2020 19:51        Scheduled Meds: . sodium chloride   Intravenous Once  . sodium chloride   Intravenous Once  . aspirin EC  81 mg Oral Q breakfast  . atorvastatin  20 mg Oral Daily  . docusate sodium  100 mg Oral BID  . enoxaparin (LOVENOX) injection  30 mg Subcutaneous Q24H  . escitalopram  10 mg Oral q morning - 10a  . fludrocortisone  200 mcg Oral Daily  . hydrALAZINE  25 mg Oral TID  . insulin aspart  0-5 Units Subcutaneous QHS  . insulin aspart  0-9 Units Subcutaneous TID WC  . insulin aspart  2 Units Subcutaneous TID WC  . melatonin  5 mg Oral QHS  . metoprolol succinate  100 mg Oral q morning - 10a  . midodrine  10 mg Oral TID WC  . multivitamin with minerals  1 tablet Oral Daily  . potassium chloride SA  40 mEq Oral Daily  . pregabalin  75 mg Oral BID  . QUEtiapine  25 mg Oral QHS  . senna  1 tablet Oral BID  . sodium chloride flush  3 mL Intravenous Q12H    Continuous Infusions: . sodium chloride    . lactated ringers 50 mL/hr at 06/12/20 1042  . methocarbamol (ROBAXIN) IV       LOS: 2 days    Time spent: 25 minutes    Barb Merino, MD [facility] Pager [phone]

## 2020-06-13 NOTE — Progress Notes (Signed)
Subjective: 2 Days Post-Op Procedure(s) (LRB): OPEN REDUCTION INTERNAL FIXATION LEFT TIBIA (Left) Patient reports pain as mild.  The patient c/o soreness of her ankle.  She is getting PRBCs for post op anemia.  Objective: Vital signs in last 24 hours: Temp:  [97.9 F (36.6 C)-99.3 F (37.4 C)] 99.1 F (37.3 C) (07/08 1157) Pulse Rate:  [62-100] 100 (07/08 1157) Resp:  [15-20] 16 (07/08 1157) BP: (125-162)/(56-96) 156/96 (07/08 1157) SpO2:  [94 %-100 %] 96 % (07/08 1157)  Intake/Output from previous day: 07/07 0701 - 07/08 0700 In: 690 [P.O.:690] Out: 300 [Urine:300] Intake/Output this shift: Total I/O In: 590 [P.O.:240; Blood:350] Out: -   Recent Labs    06/11/20 1240 06/11/20 2307 06/12/20 0435 06/13/20 0346  HGB 7.9* 7.1* 7.1* 5.9*   Recent Labs    06/12/20 0435 06/13/20 0346  WBC 12.8* 9.4  RBC 2.55* 2.13*  HCT 23.2* 19.5*  PLT 191 162   Recent Labs    06/12/20 0435 06/13/20 0346  NA 145 143  K 4.3 4.4  CL 103 104  CO2 31 29  BUN 46* 43*  CREATININE 3.00* 3.16*  GLUCOSE 199* 158*  CALCIUM 8.1* 7.8*   Recent Labs    06/11/20 1240  INR 1.0    PE  wn wd female in nad.  L LE splinted.  ecchmosis as the toes is resolveing.  Intact sens to LT at the forefoot.  active PF and DF strength of the toes.  Brisk cap refill at the toes.   Assessment/Plan: 2 Days Post-Op Procedure(s) (LRB): OPEN REDUCTION INTERNAL FIXATION LEFT TIBIA (Left) Continue PT for NWB gait training.  F/u in the office in two weeks for suture removal and casting.       Wylene Simmer 06/13/2020, 12:35 PM

## 2020-06-13 NOTE — TOC CAGE-AID Note (Signed)
Transition of Care [facility]) - CAGE-AID Screening   Patient Details  Name: Victoria Winters MRN: [id number] Date of Birth: 04-Mar-1951  Transition of Care [facility]) CM/SW Contact:    Emeterio Reeve, [place] Phone Number: 06/13/2020, 12:04 PM   Clinical Narrative:  CSW met with pt at bedside. CSW introduced self and explained her role at the hospital. Pt denied alcohol use and substance use.   CAGE-AID Screening:    Have You Ever Felt You Ought to Cut Down on Your Drinking or Drug Use?: No Have People Annoyed You By Critizing Your Drinking Or Drug Use?: No Have You Felt Bad Or Guilty About Your Drinking Or Drug Use?: No Have You Ever Had a Drink or Used Drugs First Thing In The Morning to STeady Your Nerves or to Get Rid of a Hangover?: No CAGE-AID Score: 0  Substance Abuse Education Offered: Yes    Blima Ledger, [place] Social Worker [phone]

## 2020-06-13 NOTE — TOC Progression Note (Signed)
Transition of Care (TOC) - Progression Note    Patient Details  Name: Victoria Winters MRN: [id number] Date of Birth: 05/11/1951  Transition of Care (TOC) CM/SW Contact   R [place]ub[place], RN Phone Number: 06/13/2020, 2:56 PM  Clinical Narrative:    Case Management met with the patient and sister at the bedside to give Medicare choice for SNF placement.  The sister, [name], chose [facility].  Kathy at [facility] care [facility] was notified and offered placement for the patient.  [employer] [employer] was called and insurance authorization was started for 06/14/2020.  Clinicals were faxed to [facility] and waiting on insurance authorization.  Will continue to follow for placement.  Proof of patient's COVID vaccines were faxed to [facility] as well.   Expected Discharge Plan: Skilled Nursing Facility Barriers to Discharge: Continued Medical Work up  Expected Discharge Plan and Services Expected Discharge Plan: Skilled Nursing Facility   Discharge Planning Services: CM Consult Post Acute Care Choice: Skilled Nursing Facility Living arrangements for the past 2 months: Single Family Home                                       Social Determinants of Health (SDOH) Interventions    Readmission Risk Interventions Readmission Risk Prevention Plan 06/12/2020 04/23/2020 07/28/2019  Transportation Screening Complete Complete -  PCP or Specialist Appt within 3-5 Days - Complete Not Complete  Not Complete comments - - Pt going to SNF rehab where the SNF MD will follow  Medication Review (RN CM) - - -  HRI or Home Care Consult - Complete Not Complete  HRI or Home Care Consult comments - - Pt going to SNF rehab  Social Work Consult for Recovery Care Planning/Counseling - Complete Complete  Palliative Care Screening - Not Complete Not Applicable  Medication Review (RN Care Manager) Complete Complete Complete  PCP or Specialist appointment within 3-5 days of discharge  Complete - -  HRI or Home Care Consult Complete - -  SW Recovery Care/Counseling Consult Complete - -  Palliative Care Screening Complete - -  Skilled Nursing Facility Complete - -   

## 2020-06-13 NOTE — Progress Notes (Signed)
First unit of PRBCs started @ [phone]

## 2020-06-14 LAB — TYPE AND SCREEN
ABO/RH(D): A POS
Antibody Screen: NEGATIVE
Unit division: 0
Unit division: 0

## 2020-06-14 LAB — CBC
HCT: 27.9 % — ABNORMAL LOW (ref 36.0–46.0)
Hemoglobin: 8.4 g/dL — ABNORMAL LOW (ref 12.0–15.0)
MCH: 27 pg (ref 26.0–34.0)
MCHC: 30.1 g/dL (ref 30.0–36.0)
MCV: 89.7 fL (ref 80.0–100.0)
Platelets: 151 10*3/uL (ref 150–400)
RBC: 3.11 MIL/uL — ABNORMAL LOW (ref 3.87–5.11)
RDW: 15.7 % — ABNORMAL HIGH (ref 11.5–15.5)
WBC: 10.3 10*3/uL (ref 4.0–10.5)
nRBC: 0 % (ref 0.0–0.2)

## 2020-06-14 LAB — BASIC METABOLIC PANEL
Anion gap: 11 (ref 5–15)
BUN: 42 mg/dL — ABNORMAL HIGH (ref 8–23)
CO2: 27 mmol/L (ref 22–32)
Calcium: 8.1 mg/dL — ABNORMAL LOW (ref 8.9–10.3)
Chloride: 105 mmol/L (ref 98–111)
Creatinine, Ser: 2.99 mg/dL — ABNORMAL HIGH (ref 0.44–1.00)
GFR calc Af Amer: 18 mL/min — ABNORMAL LOW (ref >60)
GFR calc non Af Amer: 15 mL/min — ABNORMAL LOW (ref >60)
Glucose, Bld: 180 mg/dL — ABNORMAL HIGH (ref 70–99)
Potassium: 4.6 mmol/L (ref 3.5–5.1)
Sodium: 143 mmol/L (ref 135–145)

## 2020-06-14 LAB — BPAM RBC
Blood Product Expiration Date: 202108042359
Blood Product Expiration Date: 202108042359
ISSUE DATE / TIME: 202107080505
ISSUE DATE / TIME: 202107081124
Unit Type and Rh: 6200
Unit Type and Rh: 6200

## 2020-06-14 LAB — GLUCOSE, CAPILLARY
Glucose-Capillary: 169 mg/dL — ABNORMAL HIGH (ref 70–99)
Glucose-Capillary: 249 mg/dL — ABNORMAL HIGH (ref 70–99)
Glucose-Capillary: 265 mg/dL — ABNORMAL HIGH (ref 70–99)
Glucose-Capillary: 242 mg/dL — ABNORMAL HIGH (ref 70–99)

## 2020-06-14 MED ORDER — PREGABALIN 75 MG PO CAPS: 75.0000 mg | ORAL_CAPSULE | Freq: Two times a day (BID) | ORAL | 0 refills | Status: DC

## 2020-06-14 MED ORDER — INSULIN GLARGINE 100 UNIT/ML ~~LOC~~ SOLN
10.0000 [IU] | Freq: Every day | SUBCUTANEOUS | Status: DC
  Administered 2020-06-14: 10 [IU] via SUBCUTANEOUS
  Filled 2020-06-14: qty 0.1

## 2020-06-14 NOTE — Progress Notes (Signed)
Physical Therapy Treatment Patient Details Name: Victoria Winters MRN: [id number] DOB: [birth year] Today's Date: 06/14/2020    History of Present Illness Pt is a 69 y.o. F with significant PMH of diabetes, chronic kidney disease, stroke, memory loss who presents after a fall with a left distal tibia and fibula lateral malleolus fracture now s/p ORIF 06/11/2020.    PT Comments    Treatment session limited by pain (pt premedicated at beginning of session). Trialed placing patient on RA, but desaturation noted to 87%, so returned to 2L O2. Pt requiring two person total assist to transition to edge of bed. Adamantly refusing transfer out of bed so returned to supine. Minimal participation in BLE exercises; still unable to wiggle toes on left foot. Continue to recommend SNF for ongoing Physical Therapy.      Follow Up Recommendations  SNF;Supervision/Assistance - 24 hour     Equipment Recommendations  Other (comment) (defer)    Recommendations for Other Services       Precautions / Restrictions Precautions Precautions: Fall Restrictions Weight Bearing Restrictions: Yes LLE Weight Bearing: Non weight bearing    Mobility  Bed Mobility Overal bed mobility: Needs Assistance Bed Mobility: Supine to Sit;Sit to Supine;Rolling     Supine to sit: +2 for physical assistance;Total assist Sit to supine: Max assist;+2 for physical assistance   General bed mobility comments: TotalA + 2 to transition to edge of bed via helicopter method and use of bed pad, pt unable to even assist with RLE due to pain. MaxA + 2 to return to supine with assist for BLE elevation back into bed and trunk guidance.  Transfers                 General transfer comment: pt adamantly refusing  Ambulation/Gait                 Stairs             Wheelchair Mobility    Modified Rankin (Stroke Patients Only)       Balance Overall balance assessment: Needs assistance Sitting-balance support: Feet  unsupported;No upper extremity supported Sitting balance-Leahy Scale: Fair Sitting balance - Comments: Supervision for safety sitting EOB                                    Cognition Arousal/Alertness: Awake/alert Behavior During Therapy: WFL for tasks assessed/performed Overall Cognitive Status: History of cognitive impairments - at baseline                                 General Comments: Pt with history of memory loss. Pt oriented to "[facility]," but not city. Follows 1 step commands ~50% of the time with max repetition and increased time.      Exercises General Exercises - Lower Extremity Quad Sets: Both;5 reps;Supine Long Arc Quad: AROM;AAROM;10 reps;Both;Seated Heel Slides: Right;10 reps;Supine    General Comments        Pertinent Vitals/Pain Pain Assessment: Faces Faces Pain Scale: Hurts worst Pain Location: LLE with movement Pain Descriptors / Indicators: Grimacing;Guarding;Sharp Pain Intervention(s): Limited activity within patient's tolerance;Monitored during session;Premedicated before session;Repositioned    Home Living                      Prior Function  PT Goals (current goals can now be found in the care plan section) Acute Rehab PT Goals Patient Stated Goal: less pain PT Goal Formulation: With patient Time For Goal Achievement: 06/26/20 Potential to Achieve Goals: Fair Progress towards PT goals: Not progressing toward goals - comment (limited by pain)    Frequency    Min 3X/week      PT Plan Current plan remains appropriate    Co-evaluation              AM-PAC PT "6 Clicks" Mobility   Outcome Measure  Help needed turning from your back to your side while in a flat bed without using bedrails?: A Lot Help needed moving from lying on your back to sitting on the side of a flat bed without using bedrails?: Total Help needed moving to and from a bed to a chair (including a  wheelchair)?: Total Help needed standing up from a chair using your arms (e.g., wheelchair or bedside chair)?: Total Help needed to walk in hospital room?: Total Help needed climbing 3-5 steps with a railing? : Total 6 Click Score: 7    End of Session Equipment Utilized During Treatment: Oxygen Activity Tolerance: Patient limited by pain Patient left: in bed;with call bell/phone within reach;with bed alarm set Nurse Communication: Mobility status PT Visit Diagnosis: Pain;Other abnormalities of gait and mobility (R26.89);Muscle weakness (generalized) (M62.81) Pain - Right/Left: Left Pain - part of body: Leg     Time: 6378-5885 PT Time Calculation (min) (ACUTE ONLY): 22 min  Charges:  $Therapeutic Activity: 8-22 mins                       Wyona Almas, PT, DPT Acute Rehabilitation Services Pager [phone] Office [phone]    Deno Etienne 06/14/2020, 11:11 AM

## 2020-06-14 NOTE — Progress Notes (Signed)
PROGRESS NOTE    Victoria Winters  MBT:[id number] DOB: 1951/07/12 DOA: 06/11/2020 PCP: Alfonse Flavors, MD    Brief Narrative:  69 year old female with history of chronic diastolic heart failure, type 2 diabetes, stage IV chronic kidney disease, cognitive impairment with vascular dementia, presented to the emergency room on 7/6 with fall at home and obvious deformity of the left foot and ankle area.  Patient does have cognitive dysfunction with previous stroke, she lives at home with her sister.  In the emergency room she was hemodynamically stable.  She was found to have left distal tibia, left distal fibula and fourth and fifth metatarsal neck fractures.  Admit to the hospital for ORIF.   Assessment & Plan:   Principal Problem:   Tibia/fibula fracture, left, closed, initial encounter Active Problems:   CKD (chronic kidney disease) IV   Brain aneurysm   DM (diabetes mellitus) ([place])   History of CVA (cerebrovascular accident)   Anemia in chronic kidney disease (CKD)   (HFpEF) heart failure with preserved ejection fraction - Grade 2 diastolic dysfunction   AKI (acute kidney injury) ([place])  Acute comminuted fracture of the distal left tibia and fibula along with fracture of the fourth and fifth metatarsal necks: Status post ORIF left distal tibia, status post ORIF left fibula and lateral malleolus on 7/6. Surgically stable as per surgery. DVT prophylaxis, Lovenox in the house, aspirin 81 mg twice daily upon DC. Nonweightbearing. Work with PT OT, referred to inpatient physical therapy at a skilled nursing facility. Adequate pain medications.  Anemia of chronic kidney disease, acute on chronic anemia with expected perioperative blood loss and hemodilution: Hemoglobin 5.9.  Baseline hemoglobin about 9.  No evidence of bleeding. Received 2 units of PRBC transfusion with appropriate response.  Recheck hemoglobin tomorrow morning. Since patient has no evidence of active bleeding, will  continue Lovenox prophylaxis.  Acute kidney injury on chronic kidney disease stage IV: Reported baseline creatinine about 2.4.  Stabilizing and back to baseline.  Diuretics on hold.  Heart failure with preserved ejection fraction: Euvolemic.  Recent echocardiogram with ejection fraction 65 to 70%.  Euvolemic.  Type 2 diabetes, uncontrolled with hyperglycemia: On Tradjenta and insulin at home.  Continue to hold.  Start long-acting insulin today.  Continue on sliding scale insulin.  Adrenal insufficiency: On midodrine and hydrocortisone that she will continue.  Cognitive impairment, vascular dementia: Patient on multiple medications regimen including Seroquel, Lexapro and also on Lyrica.  Will use carefully along with pain medications to avoid oversedation.  DVT prophylaxis: enoxaparin (LOVENOX) injection 30 mg Start: 06/12/20 0800 SCDs Start: 06/11/20 2049 SCDs Start: 06/11/20 1536   Code Status: DNR Family Communication: Patient's sister on 7/8 Disposition Plan: Status is: Inpatient  Remains inpatient appropriate because:Unsafe d/c plan   Dispo: The patient is from: Home              Anticipated d/c is to: SNF              Anticipated d/c date is: 2 days              Patient currently is medically stable to transfer to a skilled level of care.         Consultants:   Orthopedics  Procedures:   ORIF, 7/6  Antimicrobials:   None   Subjective: Patient seen and examined.  No overnight events.  Today denies any complaints.  She still has some pain on her left foot but using some pain medications as per her.  Objective: Vitals:   06/13/20 2016 06/14/20 0300 06/14/20 0808 06/14/20 1014  BP: (!) 168/67 (!) 156/72 (!) 148/66 (!) 148/86  Pulse: 62 62 65 65  Resp: 16 16 16    Temp: 97.8 F (36.6 C) 97.7 F (36.5 C) 98.3 F (36.8 C)   TempSrc: Oral Oral Oral   SpO2: 99% 97% 98%   Weight:      Height:        Intake/Output Summary (Last 24 hours) at 06/14/2020  1105 Last data filed at 06/14/2020 8841 Gross per 24 hour  Intake 1110 ml  Output --  Net 1110 ml   Filed Weights   06/11/20 1137  Weight: 99.8 kg    Examination:  General exam: Appears calm and comfortable, chronically sick looking.  Not in any distress. On room air. Mostly alert and oriented.  She has some cognitive delay. Respiratory system: Clear to auscultation. Respiratory effort normal. Cardiovascular system: S1 & S2 heard,  Gastrointestinal system: Abdomen is nondistended, soft and nontender.  Extremities: Symmetric 5 x 5 power. Left leg on below-knee cast, some ecchymosis on the distal exposed toes with intact vasculature and neurovascular status.    Data Reviewed: I have personally reviewed following labs and imaging studies  CBC: Recent Labs  Lab 06/11/20 1240 06/11/20 1240 06/11/20 2307 06/12/20 0435 06/13/20 0346 06/13/20 1844 06/14/20 0720  WBC 13.1*  --  11.4* 12.8* 9.4  --  10.3  NEUTROABS 9.1*  --   --   --   --   --   --   HGB 7.9*   < > 7.1* 7.1* 5.9* 8.6* 8.4*  HCT 26.1*   < > 24.0* 23.2* 19.5* 27.9* 27.9*  MCV 89.7  --  89.9 91.0 91.5  --  89.7  PLT 246  --  210 191 162  --  151   < > = values in this interval not displayed.   Basic Metabolic Panel: Recent Labs  Lab 06/11/20 1240 06/11/20 2307 06/12/20 0435 06/13/20 0346 06/14/20 0720  NA 143  --  145 143 143  K 4.2  --  4.3 4.4 4.6  CL 101  --  103 104 105  CO2 32  --  31 29 27   GLUCOSE 245*  --  199* 158* 180*  BUN 51*  --  46* 43* 42*  CREATININE 2.90* 2.89* 3.00* 3.16* 2.99*  CALCIUM 8.3*  --  8.1* 7.8* 8.1*   GFR: Estimated Creatinine Clearance: 19.2 mL/min (A) (by C-G formula based on SCr of 2.99 mg/dL (H)). Liver Function Tests: Recent Labs  Lab 06/11/20 1240  AST 16  ALT 15  ALKPHOS 80  BILITOT 0.6  PROT 6.0*  ALBUMIN 3.0*   No results for input(s): LIPASE, AMYLASE in the last 168 hours. No results for input(s): AMMONIA in the last 168 hours. Coagulation  Profile: Recent Labs  Lab 06/11/20 1240  INR 1.0   Cardiac Enzymes: No results for input(s): CKTOTAL, CKMB, CKMBINDEX, TROPONINI in the last 168 hours. BNP (last 3 results) No results for input(s): PROBNP in the last 8760 hours. HbA1C: Recent Labs    06/12/20 0435  HGBA1C 8.1*   CBG: Recent Labs  Lab 06/13/20 0854 06/13/20 1149 06/13/20 1621 06/13/20 1959 06/14/20 0727  GLUCAP 150* 221* 254* 203* 169*   Lipid Profile: No results for input(s): CHOL, HDL, LDLCALC, TRIG, CHOLHDL, LDLDIRECT in the last 72 hours. Thyroid Function Tests: No results for input(s): TSH, T4TOTAL, FREET4, T3FREE, THYROIDAB in the last 72 hours. Anemia  Panel: No results for input(s): VITAMINB12, FOLATE, FERRITIN, TIBC, IRON, RETICCTPCT in the last 72 hours. Sepsis Labs: No results for input(s): PROCALCITON, LATICACIDVEN in the last 168 hours.  Recent Results (from the past 240 hour(s))  SARS Coronavirus 2 by RT PCR (hospital order, performed in [facility] hospital lab) Nasopharyngeal Nasopharyngeal Swab     Status: None   Collection Time: 06/11/20  1:46 PM   Specimen: Nasopharyngeal Swab  Result Value Ref Range Status   SARS Coronavirus 2 NEGATIVE NEGATIVE Final    Comment: (NOTE) SARS-CoV-2 target nucleic acids are NOT DETECTED.  The SARS-CoV-2 RNA is generally detectable in upper and lower respiratory specimens during the acute phase of infection. The lowest concentration of SARS-CoV-2 viral copies this assay can detect is 250 copies / mL. A negative result does not preclude SARS-CoV-2 infection and should not be used as the sole basis for treatment or other patient management decisions.  A negative result may occur with improper specimen collection / handling, submission of specimen other than nasopharyngeal swab, presence of viral mutation(s) within the areas targeted by this assay, and inadequate number of viral copies (<250 copies / mL). A negative result must be combined with  clinical observations, patient history, and epidemiological information.  Fact Sheet for Patients:   StrictlyIdeas.no  Fact Sheet for Healthcare Providers: BankingDealers.co.za  This test is not yet approved or  cleared by the [place] FDA and has been authorized for detection and/or diagnosis of SARS-CoV-2 by FDA under an Emergency Use Authorization (EUA).  This EUA will remain in effect (meaning this test can be used) for the duration of the COVID-19 declaration under Section 564(b)(1) of the Act, 21 U.S.C. section 360bbb-3(b)(1), unless the authorization is terminated or revoked sooner.  Performed at [facility], [address], [place], [place] [address]          Radiology Studies: No results found.      Scheduled Meds: . sodium chloride   Intravenous Once  . sodium chloride   Intravenous Once  . aspirin EC  81 mg Oral Q breakfast  . atorvastatin  20 mg Oral Daily  . docusate sodium  100 mg Oral BID  . enoxaparin (LOVENOX) injection  30 mg Subcutaneous Q24H  . escitalopram  10 mg Oral q morning - 10a  . fludrocortisone  200 mcg Oral Daily  . hydrALAZINE  25 mg Oral TID  . insulin aspart  0-5 Units Subcutaneous QHS  . insulin aspart  0-9 Units Subcutaneous TID WC  . insulin aspart  2 Units Subcutaneous TID WC  . insulin glargine  10 Units Subcutaneous QHS  . melatonin  5 mg Oral QHS  . metoprolol succinate  100 mg Oral q morning - 10a  . midodrine  10 mg Oral TID WC  . multivitamin with minerals  1 tablet Oral Daily  . potassium chloride SA  40 mEq Oral Daily  . pregabalin  75 mg Oral BID  . QUEtiapine  25 mg Oral QHS  . senna  1 tablet Oral BID  . sodium chloride flush  3 mL Intravenous Q12H   Continuous Infusions: . sodium chloride    . lactated ringers 50 mL/hr at 06/12/20 1042  . methocarbamol (ROBAXIN) IV       LOS: 3 days    Time spent: 25 minutes    Barb Merino, MD [facility] Pager [phone]

## 2020-06-14 NOTE — Plan of Care (Signed)

## 2020-06-14 NOTE — Discharge Summary (Signed)
Physician Discharge Summary  Victoria Winters VHQ:[id number] DOB: [birth year] DOA: 06/11/2020  PCP: Alfonse Flavors, MD  Admit date: 06/11/2020 Discharge date: 06/14/2020  Admitted From: Home Disposition: Skilled nursing facility  Recommendations for Outpatient Follow-up:  1. Follow up with PCP in 1-2 weeks 2. Please obtain BMP/CBC in one week 3. Follow-up with orthopedic surgeon in 2 weeks, they will schedule follow-up   Discharge Condition: Stable CODE STATUS: DNR Diet recommendation: Low-salt low-carb diet  Discharge summary: 69 year old female with history of chronic diastolic heart failure, type 2 diabetes, stage IV chronic kidney disease, cognitive impairment with vascular dementia, presented to the emergency room on 7/6 with fall at home and obvious deformity of the left foot and ankle area.  Patient does have cognitive dysfunction with previous stroke, she lives at home with her sister.  In the emergency room she was hemodynamically stable.  She was found to have left distal tibia, left distal fibula and fourth and fifth metatarsal neck fractures.  Admit to the hospital for ORIF.   Assessment & Plan of care:   Principal Problem:   Tibia/fibula fracture, left, closed, initial encounter Active Problems:   CKD (chronic kidney disease) IV   Brain aneurysm   DM (diabetes mellitus) ([place])   History of CVA (cerebrovascular accident)   Anemia in chronic kidney disease (CKD)   (HFpEF) heart failure with preserved ejection fraction - Grade 2 diastolic dysfunction   AKI (acute kidney injury) ([place])  Acute comminuted fracture of the distal left tibia and fibula along with fracture of the fourth and fifth metatarsal necks: Status post ORIF left distal tibia, status post ORIF left fibula and lateral malleolus on 7/6. Surgically stable as per surgery. DVT prophylaxis, aspirin 81 mg twice daily upon DC. Nonweightbearing. Work with PT OT, referred to inpatient physical therapy at a  skilled nursing facility. Adequate pain medications.  Anemia of chronic kidney disease, acute on chronic anemia with expected perioperative blood loss and hemodilution: Hemoglobin 5.9.  Baseline hemoglobin about 9.  No evidence of bleeding. Received 2 units of PRBC transfusion with appropriate response.   Recheck hemoglobin in 1 week to ensure stabilization.  Acute kidney injury on chronic kidney disease stage IV: Reported baseline creatinine about 2.4.  Stabilizing and back to baseline. Resume diuretics. Recheck in about 1 week to ensure stabilization.  Heart failure with preserved ejection fraction: Euvolemic.  Recent echocardiogram with ejection fraction 65 to 70%.  Euvolemic. Resume diuretics and metoprolol.  Type 2 diabetes, uncontrolled with hyperglycemia: On Tradjenta and insulin at home.   Resume.  Adrenal insufficiency: On midodrine and hydrocortisone that she will continue.  Cognitive impairment, vascular dementia: Patient on multiple medications regimen including Seroquel, Lexapro and also on Lyrica.  Will use carefully along with pain medications to avoid oversedation.  Clinically stable to transfer to skilled level of care.  Discharge Diagnoses:  Principal Problem:   Tibia/fibula fracture, left, closed, initial encounter Active Problems:   CKD (chronic kidney disease) IV   Brain aneurysm   DM (diabetes mellitus) ([facility])   History of CVA (cerebrovascular accident)   Anemia in chronic kidney disease (CKD)   (HFpEF) heart failure with preserved ejection fraction - Grade 2 diastolic dysfunction   AKI (acute kidney injury) [facility])    Discharge Instructions  Discharge Instructions    Call MD for:  redness, tenderness, or signs of infection (pain, swelling, redness, odor or green/yellow discharge around incision site)   Complete by: As directed    Call MD for:  severe  uncontrolled pain   Complete by: As directed    Diet - low sodium heart healthy   Complete by: As  directed    Increase activity slowly   Complete by: As directed    No wound care   Complete by: As directed      Allergies as of 06/14/2020      Reactions   Penicillins Anaphylaxis   Did it involve swelling of the face/tongue/throat, SOB, or low BP? Yes Did it involve sudden or severe rash/hives, skin peeling, or any reaction on the inside of your mouth or nose? Unknown Did you need to seek medical attention at a hospital or doctor's office? Unknown When did it last happen?Within past 10 years If all above answers are "NO", may proceed with cephalosporin use.   Chicken Allergy    Contrast Media [iodinated Diagnostic Agents]    Eggs Or Egg-derived Products Swelling   Patient is allergic to egg yolk, but can eat egg white with no problems   Milk-related Compounds    Nickel    Oatmeal    Other    Black beans, cats, dogs, grass clippings   Vaccinium Angustifolium Hives   Wheat Bran       Medication List    TAKE these medications   aspirin EC 81 MG tablet Take 1 tablet (81 mg total) by mouth 2 (two) times daily. What changed:   when to take this  Another medication with the same name was removed. Continue taking this medication, and follow the directions you see here.   atorvastatin 20 MG tablet Commonly known as: LIPITOR Take 20 mg by mouth at bedtime.   cetirizine 10 MG tablet Commonly known as: ZYRTEC Take 10 mg by mouth at bedtime.   docusate sodium 100 MG capsule Commonly known as: Colace Take 1 capsule (100 mg total) by mouth 2 (two) times daily. While taking narcotic pain medicine.   escitalopram 10 MG tablet Commonly known as: LEXAPRO Take 10 mg by mouth every morning.   fludrocortisone 0.1 MG tablet Commonly known as: FLORINEF Take 2 tablets by mouth daily.   hydrALAZINE 25 MG tablet Commonly known as: APRESOLINE Take 1 tablet (25 mg total) by mouth 3 (three) times daily.   insulin glargine 100 UNIT/ML injection Commonly known as: LANTUS Inject  0.1 mLs (10 Units total) into the skin at bedtime.   insulin lispro 100 UNIT/ML injection Commonly known as: HUMALOG Inject 4-12 Units into the skin 3 (three) times daily before meals. Sliding scale starting at; 180-220= 4 units 221-260= 6 units 261-300= 8 units 301-340=10units 341-380=12units   linagliptin 5 MG Tabs tablet Commonly known as: TRADJENTA Take 5 mg by mouth every morning.   melatonin 5 MG Tabs Take 5 mg by mouth at bedtime.   metoprolol succinate 100 MG 24 hr tablet Commonly known as: TOPROL-XL Take 100 mg by mouth every morning.   midodrine 10 MG tablet Commonly known as: PROAMATINE Take 1 tablet (10 mg total) by mouth 3 (three) times daily with meals.   multivitamin with minerals Tabs tablet Take 1 tablet by mouth daily.   oxyCODONE 5 MG immediate release tablet Commonly known as: Roxicodone Take 1 tablet (5 mg total) by mouth every 4 (four) hours as needed for up to 5 days for moderate pain or severe pain.   potassium chloride SA 20 MEQ tablet Commonly known as: KLOR-CON Take 2 tablets (40 mEq total) by mouth daily.   pregabalin 75 MG capsule Commonly known as: LYRICA  Take 1 capsule (75 mg total) by mouth 2 (two) times daily.   QUEtiapine 25 MG tablet Commonly known as: SEROQUEL Take 1 tablet (25 mg total) by mouth at bedtime.   senna 8.6 MG Tabs tablet Commonly known as: SENOKOT Take 2 tablets (17.2 mg total) by mouth 2 (two) times daily.   torsemide 20 MG tablet Commonly known as: DEMADEX Take 3 tablets (60 mg total) by mouth 2 (two) times daily.       Follow-up Information    Wylene Simmer, MD. Schedule an appointment as soon as possible for a visit in 2 week(s).   Specialty: Orthopedic Surgery Contact information: [address] [place] [address] [phone]              Allergies  Allergen Reactions  . Penicillins Anaphylaxis    Did it involve swelling of the face/tongue/throat, SOB, or low BP? Yes Did  it involve sudden or severe rash/hives, skin peeling, or any reaction on the inside of your mouth or nose? Unknown Did you need to seek medical attention at a hospital or doctor's office? Unknown When did it last happen?Within past 10 years If all above answers are "NO", may proceed with cephalosporin use.   . Chicken Allergy   . Contrast Media [Iodinated Diagnostic Agents]   . Eggs Or Egg-Derived Products Swelling    Patient is allergic to egg yolk, but can eat egg white with no problems  . Milk-Related Compounds   . Nickel   . Oatmeal   . Other     Black beans, cats, dogs, grass clippings  . Vaccinium Angustifolium Hives  . Wheat Bran     Consultations:  Orthopedics   Procedures/Studies: DG Chest 1 View  Result Date: 06/11/2020 CLINICAL DATA:  Pain following fall EXAM: CHEST  1 VIEW COMPARISON:  Apr 22, 2020 FINDINGS: There is cardiomegaly with pulmonary venous hypertension. There is mild interstitial edema. No airspace opacity. No pneumothorax. No adenopathy. No bone lesions. IMPRESSION: Cardiomegaly with pulmonary vascular congestion. There is a degree of interstitial edema. Appearance is felt to be indicative of a degree of congestive heart failure. No consolidation. No pneumothorax. Electronically Signed   By: Lowella Grip III M.D.   On: 06/11/2020 13:18   DG Pelvis 1-2 Views  Result Date: 06/11/2020 CLINICAL DATA:  Pain following fall EXAM: PELVIS - 1-2 VIEW COMPARISON:  None. FINDINGS: There is no evidence of pelvic fracture or dislocation. There is slight symmetric narrowing of each hip joint. No erosive change. IMPRESSION: Slight symmetric narrowing of each hip joint. No fracture or dislocation. Electronically Signed   By: Lowella Grip III M.D.   On: 06/11/2020 13:17   DG Tibia/Fibula Left  Result Date: 06/11/2020 CLINICAL DATA:  Distal tibial and fibular fractures. EXAM: DG C-ARM 1-60 MIN; LEFT TIBIA AND FIBULA - 2 VIEW COMPARISON:  Films from earlier in the same  day. FLUOROSCOPY TIME:  Fluoroscopy Time:  1 minutes 16 seconds Radiation Exposure Index (if provided by the fluoroscopic device): 1.94 mGy Number of Acquired Spot Images: 8 FINDINGS: There is a fixation sideplate noted along the distal tibia with multiple fixation screws. Small medullary rod is noted within the distal fibula. Fracture fragments are in near anatomic alignment. Proximal fibular fracture is not appreciated on this exam. IMPRESSION: Status post ORIF of distal tibial and fibular fractures. Electronically Signed   By: Inez Catalina M.D.   On: 06/11/2020 19:51   DG Ankle Complete Left  Result Date: 06/11/2020 CLINICAL  DATA:  Ankle pain after fall EXAM: LEFT FOOT - COMPLETE 3+ VIEW; LEFT ANKLE COMPLETE - 3+ VIEW COMPARISON:  07/21/2019 FINDINGS: Acute comminuted fracture of the distal left tibial metaphysis with 4 mm of lateral displacement and mild anterior apex angulation. Dominant fracture fragment measures up to 5.0 cm. No definite fracture extension to the tibiotalar joint. Acute minimally displaced transversely oriented fracture of the distal fibular metaphysis just proximal to the level of the distal tibiofibular joint. Ankle mortise remains congruent without widening or dislocation. Nondisplaced fractures of the fourth and fifth metatarsal necks. Bony alignment of the left foot is maintained other than pes cavus alignment. There is prominent soft tissue swelling of the dorsal foot, ankle, and lower leg. IMPRESSION: 1. Acute comminuted mildly displaced and angulated fracture of the distal left tibial metaphysis. 2. Acute minimally displaced fracture of the distal fibular metaphysis. 3. Nondisplaced fractures of the fourth and fifth metatarsal necks. Electronically Signed   By: Davina Poke D.O.   On: 06/11/2020 12:10   CT HEAD WO CONTRAST  Result Date: 06/11/2020 CLINICAL DATA:  Status post fall. EXAM: CT HEAD WITHOUT CONTRAST TECHNIQUE: Contiguous axial images were obtained from the base  of the skull through the vertex without intravenous contrast. COMPARISON:  Apr 17, 2020 FINDINGS: Brain: There is mild cerebral atrophy with widening of the extra-axial spaces and ventricular dilatation. There are areas of decreased attenuation within the white matter tracts of the supratentorial brain, consistent with microvascular disease changes. Small chronic bilateral lacunar infarcts are seen. Vascular: No hyperdense vessel or unexpected calcification. Skull: Normal. Negative for fracture or focal lesion. Sinuses/Orbits: No acute finding. Other: None. IMPRESSION: 1. Generalized cerebral atrophy. 2. Small chronic bilateral lacunar infarcts. 3. No acute intracranial abnormality. Electronically Signed   By: Virgina Norfolk M.D.   On: 06/11/2020 22:56   DG Foot Complete Left  Result Date: 06/11/2020 CLINICAL DATA:  Ankle pain after fall EXAM: LEFT FOOT - COMPLETE 3+ VIEW; LEFT ANKLE COMPLETE - 3+ VIEW COMPARISON:  07/21/2019 FINDINGS: Acute comminuted fracture of the distal left tibial metaphysis with 4 mm of lateral displacement and mild anterior apex angulation. Dominant fracture fragment measures up to 5.0 cm. No definite fracture extension to the tibiotalar joint. Acute minimally displaced transversely oriented fracture of the distal fibular metaphysis just proximal to the level of the distal tibiofibular joint. Ankle mortise remains congruent without widening or dislocation. Nondisplaced fractures of the fourth and fifth metatarsal necks. Bony alignment of the left foot is maintained other than pes cavus alignment. There is prominent soft tissue swelling of the dorsal foot, ankle, and lower leg. IMPRESSION: 1. Acute comminuted mildly displaced and angulated fracture of the distal left tibial metaphysis. 2. Acute minimally displaced fracture of the distal fibular metaphysis. 3. Nondisplaced fractures of the fourth and fifth metatarsal necks. Electronically Signed   By: Davina Poke D.O.   On:  06/11/2020 12:10   DG C-Arm 1-60 Min  Result Date: 06/11/2020 CLINICAL DATA:  Distal tibial and fibular fractures. EXAM: DG C-ARM 1-60 MIN; LEFT TIBIA AND FIBULA - 2 VIEW COMPARISON:  Films from earlier in the same day. FLUOROSCOPY TIME:  Fluoroscopy Time:  1 minutes 16 seconds Radiation Exposure Index (if provided by the fluoroscopic device): 1.94 mGy Number of Acquired Spot Images: 8 FINDINGS: There is a fixation sideplate noted along the distal tibia with multiple fixation screws. Small medullary rod is noted within the distal fibula. Fracture fragments are in near anatomic alignment. Proximal fibular fracture is not appreciated  on this exam. IMPRESSION: Status post ORIF of distal tibial and fibular fractures. Electronically Signed   By: Inez Catalina M.D.   On: 06/11/2020 19:51   DG FEMUR MIN 2 VIEWS LEFT  Result Date: 06/11/2020 CLINICAL DATA:  Fall. EXAM: LEFT FEMUR 2 VIEWS COMPARISON:  07/20/2019. FINDINGS: Slightly angulated fracture of the proximal fibula noted. This lies slightly superior to an old left fibular fracture. No other focal abnormality identified. Tibial plateau is intact. Diffuse osteopenia and mild tricompartment degenerative change. IMPRESSION: Slightly angulated fracture of the proximal fibula is noted. This lies slightly superior to an old left fibular fracture. No other acute abnormality identified. Electronically Signed   By: Marcello Moores  Register   On: 06/11/2020 13:18    (Echo, Carotid, EGD, Colonoscopy, ERCP)    Subjective: Patient seen and examined. Poor historian. Complains of pain on her foot otherwise no other overnight events. She really wants to go home however she knows she needs to go to rehab.   Discharge Exam: Vitals:   06/14/20 1014 06/14/20 1358  BP: (!) 148/86 (!) 164/86  Pulse: 65 62  Resp:  17  Temp:  97.9 F (36.6 C)  SpO2:  97%   Vitals:   06/14/20 0300 06/14/20 0808 06/14/20 1014 06/14/20 1358  BP: (!) 156/72 (!) 148/66 (!) 148/86 (!) 164/86   Pulse: 62 65 65 62  Resp: 16 16  17   Temp: 97.7 F (36.5 C) 98.3 F (36.8 C)  97.9 F (36.6 C)  TempSrc: Oral Oral    SpO2: 97% 98%  97%  Weight:      Height:        General: Pt is alert, awake, not in acute distress, alert oriented x2. Cardiovascular: RRR, S1/S2 +, no rubs, no gallops Respiratory: CTA bilaterally, no wheezing, no rhonchi Abdominal: Soft, NT, ND, bowel sounds + Extremities: Left leg on below-knee cast, ecchymosis and swelling of the toes, neurovascularly intact.    The results of significant diagnostics from this hospitalization (including imaging, microbiology, ancillary and laboratory) are listed below for reference.     Microbiology: Recent Results (from the past 240 hour(s))  SARS Coronavirus 2 by RT PCR (hospital order, performed in [facility] hospital lab) Nasopharyngeal Nasopharyngeal Swab     Status: None   Collection Time: 06/11/20  1:46 PM   Specimen: Nasopharyngeal Swab  Result Value Ref Range Status   SARS Coronavirus 2 NEGATIVE NEGATIVE Final    Comment: (NOTE) SARS-CoV-2 target nucleic acids are NOT DETECTED.  The SARS-CoV-2 RNA is generally detectable in upper and lower respiratory specimens during the acute phase of infection. The lowest concentration of SARS-CoV-2 viral copies this assay can detect is 250 copies / mL. A negative result does not preclude SARS-CoV-2 infection and should not be used as the sole basis for treatment or other patient management decisions.  A negative result may occur with improper specimen collection / handling, submission of specimen other than nasopharyngeal swab, presence of viral mutation(s) within the areas targeted by this assay, and inadequate number of viral copies (<250 copies / mL). A negative result must be combined with clinical observations, patient history, and epidemiological information.  Fact Sheet for Patients:   StrictlyIdeas.no  Fact Sheet for Healthcare  Providers: BankingDealers.co.za  This test is not yet approved or  cleared by the [place] FDA and has been authorized for detection and/or diagnosis of SARS-CoV-2 by FDA under an Emergency Use Authorization (EUA).  This EUA will remain in effect (meaning this test can  be used) for the duration of the COVID-19 declaration under Section 564(b)(1) of the Act, 21 U.S.C. section 360bbb-3(b)(1), unless the authorization is terminated or revoked sooner.  Performed at [facility], [address], [place], [place] [address]      Labs: BNP (last 3 results) Recent Labs    07/20/19 1631 04/17/20 1039 04/22/20 0945  BNP 109.0* 650.0* 706.2*   Basic Metabolic Panel: Recent Labs  Lab 06/11/20 1240 06/11/20 2307 06/12/20 0435 06/13/20 0346 06/14/20 0720  NA 143  --  145 143 143  K 4.2  --  4.3 4.4 4.6  CL 101  --  103 104 105  CO2 32  --  31 29 27   GLUCOSE 245*  --  199* 158* 180*  BUN 51*  --  46* 43* 42*  CREATININE 2.90* 2.89* 3.00* 3.16* 2.99*  CALCIUM 8.3*  --  8.1* 7.8* 8.1*   Liver Function Tests: Recent Labs  Lab 06/11/20 1240  AST 16  ALT 15  ALKPHOS 80  BILITOT 0.6  PROT 6.0*  ALBUMIN 3.0*   No results for input(s): LIPASE, AMYLASE in the last 168 hours. No results for input(s): AMMONIA in the last 168 hours. CBC: Recent Labs  Lab 06/11/20 1240 06/11/20 1240 06/11/20 2307 06/12/20 0435 06/13/20 0346 06/13/20 1844 06/14/20 0720  WBC 13.1*  --  11.4* 12.8* 9.4  --  10.3  NEUTROABS 9.1*  --   --   --   --   --   --   HGB 7.9*   < > 7.1* 7.1* 5.9* 8.6* 8.4*  HCT 26.1*   < > 24.0* 23.2* 19.5* 27.9* 27.9*  MCV 89.7  --  89.9 91.0 91.5  --  89.7  PLT 246  --  210 191 162  --  151   < > = values in this interval not displayed.   Cardiac Enzymes: No results for input(s): CKTOTAL, CKMB, CKMBINDEX, TROPONINI in the last 168 hours. BNP: Invalid input(s): POCBNP CBG: Recent Labs  Lab 06/13/20 1149 06/13/20 1621 06/13/20 1959  06/14/20 0727 06/14/20 1145  GLUCAP 221* 254* 203* 169* 249*   D-Dimer No results for input(s): DDIMER in the last 72 hours. Hgb A1c Recent Labs    06/12/20 0435  HGBA1C 8.1*   Lipid Profile No results for input(s): CHOL, HDL, LDLCALC, TRIG, CHOLHDL, LDLDIRECT in the last 72 hours. Thyroid function studies No results for input(s): TSH, T4TOTAL, T3FREE, THYROIDAB in the last 72 hours.  Invalid input(s): FREET3 Anemia work up No results for input(s): VITAMINB12, FOLATE, FERRITIN, TIBC, IRON, RETICCTPCT in the last 72 hours. Urinalysis    Component Value Date/Time   COLORURINE YELLOW 04/17/2020 [place] 04/17/2020 1519   LABSPEC 1.008 04/17/2020 1519   PHURINE 6.0 04/17/2020 1519   GLUCOSEU 150 (A) 04/17/2020 1519   HGBUR SMALL (A) 04/17/2020 1519   BILIRUBINUR NEGATIVE 04/17/2020 1519   KETONESUR NEGATIVE 04/17/2020 1519   PROTEINUR 100 (A) 04/17/2020 1519   NITRITE NEGATIVE 04/17/2020 1519   LEUKOCYTESUR NEGATIVE 04/17/2020 1519   Sepsis Labs Invalid input(s): PROCALCITONIN,  WBC,  LACTICIDVEN Microbiology Recent Results (from the past 240 hour(s))  SARS Coronavirus 2 by RT PCR (hospital order, performed in [facility] hospital lab) Nasopharyngeal Nasopharyngeal Swab     Status: None   Collection Time: 06/11/20  1:46 PM   Specimen: Nasopharyngeal Swab  Result Value Ref Range Status   SARS Coronavirus 2 NEGATIVE NEGATIVE Final    Comment: (NOTE) SARS-CoV-2 target nucleic  acids are NOT DETECTED.  The SARS-CoV-2 RNA is generally detectable in upper and lower respiratory specimens during the acute phase of infection. The lowest concentration of SARS-CoV-2 viral copies this assay can detect is 250 copies / mL. A negative result does not preclude SARS-CoV-2 infection and should not be used as the sole basis for treatment or other patient management decisions.  A negative result may occur with improper specimen collection / handling, submission of  specimen other than nasopharyngeal swab, presence of viral mutation(s) within the areas targeted by this assay, and inadequate number of viral copies (<250 copies / mL). A negative result must be combined with clinical observations, patient history, and epidemiological information.  Fact Sheet for Patients:   StrictlyIdeas.no  Fact Sheet for Healthcare Providers: BankingDealers.co.za  This test is not yet approved or  cleared by the [place] FDA and has been authorized for detection and/or diagnosis of SARS-CoV-2 by FDA under an Emergency Use Authorization (EUA).  This EUA will remain in effect (meaning this test can be used) for the duration of the COVID-19 declaration under Section 564(b)(1) of the Act, 21 U.S.C. section 360bbb-3(b)(1), unless the authorization is terminated or revoked sooner.  Performed at [facility], [address], [place], [place] [address]      Time coordinating discharge:  32 minutes  SIGNED:   Barb Merino, MD  [facility] 06/14/2020, 2:55 PM

## 2020-06-14 NOTE — Progress Notes (Signed)
Discharge packet printed and will be sent via PTAR.  Report given to [facility] Loss adjuster, chartered) at [facility].  A rolling walker was ordered and provided to the patient.  The patient's sister will pick up.

## 2020-06-14 NOTE — Progress Notes (Signed)
Inpatient Diabetes Program Recommendations  AACE/ADA: New Consensus Statement on Inpatient Glycemic Control (2015)  Target Ranges:  Prepandial:   less than 140 mg/dL      Peak postprandial:   less than 180 mg/dL (1-2 hours)      Critically ill patients:  140 - 180 mg/dL   Lab Results  Component Value Date   GLUCAP 169 (H) 06/14/2020   HGBA1C 8.1 (H) 06/12/2020    Review of Glycemic Control Results for Victoria Winters, Victoria Winters (MRN [id number]) as of 06/14/2020 09:04  Ref. Range 06/13/2020 11:49 06/13/2020 16:21 06/13/2020 19:59 06/14/2020 07:27  Glucose-Capillary Latest Ref Range: 70 - 99 mg/dL 221 (H) 254 (H) 203 (H) 169 (H)   Diabetes history: Type 2 DM Outpatient Diabetes medications: Lantus 10 units QHS, Tradjenta 5 mg QD, Humalog 4-12 units TID Current orders for Inpatient glycemic control:  Lantus 10 units QHS, Tradjenta 5 mg QD, Humalog 4-12 units TID Florinef 0.2 mg QD Inpatient Diabetes Program Recommendations:    If to remain inpatient, Consider adding Lantus 8 units QHS.   Thanks, Bronson Curb, MSN, RNC-OB Diabetes Coordinator [phone] (8a-5p)

## 2020-06-14 NOTE — TOC Transition Note (Signed)
Transition of Care [facility]) - CM/SW Discharge Note   Patient Details  Name: Victoria Winters MRN: [id number] Date of Birth: December 18, 1950  Transition of Care [facility]) CM/SW Contact:  Curlene Labrum, RN Phone Number: 06/14/2020, 3:17 PM   Clinical Narrative:    Case management received insurance authorization approval for patient's placement at [place].  The patient's sister was called and the patient will be able to be discharge today via [place].  The sister stated that she would bring the patient's clothes over to the facility later today.  I called the physician and the discharged paperwork was completed and placed in the hub for the receiving facility.   Final next level of care: Skilled Nursing Facility Barriers to Discharge: Continued Medical Work up   Patient Goals and CMS Choice Patient states their goals for this hospitalization and ongoing recovery are:: Patient's sister would like the patient to go to rehab for PT/OT.  Patient with memory issues and CVA. CMS Medicare.gov Compare Post Acute Care list provided to:: Patient Represenative (must comment) (sister) Choice offered to / list presented to : [facility] POA / [place]  Discharge Placement                       Discharge Plan and Services   Discharge Planning Services: CM Consult Post Acute Care Choice: Skilled Nursing Facility                               Social Determinants of Health (SDOH) Interventions     Readmission Risk Interventions Readmission Risk Prevention Plan 06/12/2020 04/23/2020 07/28/2019  Transportation Screening Complete Complete -  PCP or Specialist Appt within 3-5 Days - Complete Not Complete  Not Complete comments - - Pt going to SNF rehab where the SNF MD will follow  Medication Review (RN CM) - - -  HRI or [place] - Complete Not Complete  HRI or Home Care Consult comments - - Pt going to SNF rehab  Social Work Consult for [place] Planning/Counseling -  Complete Complete  Palliative Care Screening - Not Complete Not Applicable  Medication Review [employer]) Complete Complete Complete  PCP or Specialist appointment within 3-5 days of discharge Complete - -  [place] or Home Care Consult Complete - -  SW Recovery Care/Counseling Consult Complete - -  Palliative Care Screening Complete - -  Skilled Nursing Facility Complete - -

## 2020-06-14 NOTE — Progress Notes (Signed)
Subjective: 3 Days Post-Op Procedure(s) (LRB): OPEN REDUCTION INTERNAL FIXATION LEFT TIBIA (Left)  Patient reports pain as mild to moderate.  Resting comfortably in bed.  Objective:   VITALS:  Temp:  [97.7 F (36.5 C)-99.3 F (37.4 C)] 97.7 F (36.5 C) (07/09 0300) Pulse Rate:  [62-100] 62 (07/09 0300) Resp:  [16] 16 (07/09 0300) BP: (156-168)/(59-96) 156/72 (07/09 0300) SpO2:  [96 %-100 %] 97 % (07/09 0300)  General: WDWN patient in NAD. Psych:  Appropriate mood and affect. Neuro:  A&O x 1, Moving all extremities, sensation intact to light touch HEENT:  EOMs intact Chest:  Even non-labored respirations Skin: SLS C/D/I, no rashes or lesions Extremities: warm/dry, no visible edema, erythema or echymosis.  No lymphadenopathy. Pulses: Popliteus 2+ MSK:  ROM: EHL/FHL intact, MMT: able to perform quad set    LABS Recent Labs    06/11/20 2307 06/11/20 2307 06/12/20 0435 06/12/20 0435 06/13/20 0346 06/13/20 1844 06/14/20 0720  HGB 7.1*  --  7.1*  --  5.9* 8.6* 8.4*  WBC 11.4*   < > 12.8*   < > 9.4  --  10.3  PLT 210   < > 191   < > 162  --  151   < > = values in this interval not displayed.   Recent Labs    06/12/20 0435 06/13/20 0346  NA 145 143  K 4.3 4.4  CL 103 104  CO2 31 29  BUN 46* 43*  CREATININE 3.00* 3.16*  GLUCOSE 199* 158*   Recent Labs    06/11/20 1240  INR 1.0     Assessment/Plan: 3 Days Post-Op Procedure(s) (LRB): OPEN REDUCTION INTERNAL FIXATION LEFT TIBIA (Left)  NWB L LE Up with therapy Disp: SNF D/C scripts on chart Plan for 2 week outpatient post-op visit with Dr. Doran Durand.  Mechele Claude PA-C [facility] Office:  [phone]

## 2020-06-17 ENCOUNTER — Ambulatory Visit: Payer: 59 | Admitting: Physician Assistant

## 2020-07-15 ENCOUNTER — Encounter (HOSPITAL_COMMUNITY): Payer: Self-pay | Admitting: Emergency Medicine

## 2020-07-15 ENCOUNTER — Emergency Department (HOSPITAL_COMMUNITY): Payer: Medicare Other

## 2020-07-15 ENCOUNTER — Emergency Department (HOSPITAL_COMMUNITY)
Admission: EM | Admit: 2020-07-15 | Discharge: 2020-07-15 | Disposition: A | Discharge status: Home / Self Care | Payer: Medicare Other | Source: Home / Self Care | Attending: Emergency Medicine | Admitting: Emergency Medicine

## 2020-07-15 DIAGNOSIS — R3912 Poor urinary stream: Secondary | ICD-10-CM | POA: Insufficient documentation

## 2020-07-15 DIAGNOSIS — Z794 Long term (current) use of insulin: Secondary | ICD-10-CM | POA: Insufficient documentation

## 2020-07-15 DIAGNOSIS — K5900 Constipation, unspecified: Secondary | ICD-10-CM

## 2020-07-15 DIAGNOSIS — R413 Other amnesia: Secondary | ICD-10-CM | POA: Insufficient documentation

## 2020-07-15 DIAGNOSIS — R8290 Unspecified abnormal findings in urine: Secondary | ICD-10-CM | POA: Insufficient documentation

## 2020-07-15 DIAGNOSIS — N179 Acute kidney failure, unspecified: Secondary | ICD-10-CM | POA: Diagnosis not present

## 2020-07-15 DIAGNOSIS — I5033 Acute on chronic diastolic (congestive) heart failure: Secondary | ICD-10-CM | POA: Insufficient documentation

## 2020-07-15 DIAGNOSIS — Z87891 Personal history of nicotine dependence: Secondary | ICD-10-CM | POA: Insufficient documentation

## 2020-07-15 DIAGNOSIS — Z79899 Other long term (current) drug therapy: Secondary | ICD-10-CM | POA: Insufficient documentation

## 2020-07-15 DIAGNOSIS — E119 Type 2 diabetes mellitus without complications: Secondary | ICD-10-CM | POA: Insufficient documentation

## 2020-07-15 DIAGNOSIS — R5383 Other fatigue: Secondary | ICD-10-CM | POA: Insufficient documentation

## 2020-07-15 LAB — CBC WITH DIFFERENTIAL/PLATELET
Abs Immature Granulocytes: 0.06 10*3/uL (ref 0.00–0.07)
Basophils Absolute: 0.1 10*3/uL (ref 0.0–0.1)
Basophils Relative: 0 %
Eosinophils Absolute: 0.3 10*3/uL (ref 0.0–0.5)
Eosinophils Relative: 2 %
HCT: 31 % — ABNORMAL LOW (ref 36.0–46.0)
Hemoglobin: 9.5 g/dL — ABNORMAL LOW (ref 12.0–15.0)
Immature Granulocytes: 0 %
Lymphocytes Relative: 19 %
Lymphs Abs: 2.8 10*3/uL (ref 0.7–4.0)
MCH: 26.5 pg (ref 26.0–34.0)
MCHC: 30.6 g/dL (ref 30.0–36.0)
MCV: 86.4 fL (ref 80.0–100.0)
Monocytes Absolute: 1.2 10*3/uL — ABNORMAL HIGH (ref 0.1–1.0)
Monocytes Relative: 8 %
Neutro Abs: 10.2 10*3/uL — ABNORMAL HIGH (ref 1.7–7.7)
Neutrophils Relative %: 71 %
Platelets: 168 10*3/uL (ref 150–400)
RBC: 3.59 MIL/uL — ABNORMAL LOW (ref 3.87–5.11)
RDW: 15.2 % (ref 11.5–15.5)
WBC: 14.6 10*3/uL — ABNORMAL HIGH (ref 4.0–10.5)
nRBC: 0 % (ref 0.0–0.2)

## 2020-07-15 LAB — COMPREHENSIVE METABOLIC PANEL
ALT: 12 U/L (ref 0–44)
AST: 15 U/L (ref 15–41)
Albumin: 2.9 g/dL — ABNORMAL LOW (ref 3.5–5.0)
Alkaline Phosphatase: 83 U/L (ref 38–126)
Anion gap: 14 (ref 5–15)
BUN: 42 mg/dL — ABNORMAL HIGH (ref 8–23)
CO2: 27 mmol/L (ref 22–32)
Calcium: 8.2 mg/dL — ABNORMAL LOW (ref 8.9–10.3)
Chloride: 97 mmol/L — ABNORMAL LOW (ref 98–111)
Creatinine, Ser: 3.61 mg/dL — ABNORMAL HIGH (ref 0.44–1.00)
GFR calc Af Amer: 14 mL/min — ABNORMAL LOW (ref >60)
GFR calc non Af Amer: 12 mL/min — ABNORMAL LOW (ref >60)
Glucose, Bld: 243 mg/dL — ABNORMAL HIGH (ref 70–99)
Potassium: 4.4 mmol/L (ref 3.5–5.1)
Sodium: 138 mmol/L (ref 135–145)
Total Bilirubin: 0.7 mg/dL (ref 0.3–1.2)
Total Protein: 5.7 g/dL — ABNORMAL LOW (ref 6.5–8.1)

## 2020-07-15 LAB — URINALYSIS, MICROSCOPIC (REFLEX)

## 2020-07-15 LAB — URINALYSIS, ROUTINE W REFLEX MICROSCOPIC
Bilirubin Urine: NEGATIVE
Glucose, UA: 100 mg/dL — AB
Ketones, ur: NEGATIVE mg/dL
Nitrite: NEGATIVE
Protein, ur: 100 mg/dL — AB
Specific Gravity, Urine: 1.02 (ref 1.005–1.030)
pH: 5.5 (ref 5.0–8.0)

## 2020-07-15 LAB — POC OCCULT BLOOD, ED: Fecal Occult Bld: NEGATIVE

## 2020-07-15 LAB — LIPASE, BLOOD: Lipase: 27 U/L (ref 11–51)

## 2020-07-15 MED ORDER — SORBITOL 70 % SOLN
960.0000 mL | TOPICAL_OIL | Freq: Once | ORAL | Status: AC
  Administered 2020-07-15: 960 mL via RECTAL
  Filled 2020-07-15: qty 473

## 2020-07-15 MED ORDER — POLYETHYLENE GLYCOL 3350 17 GM/SCOOP PO POWD: 1.0000 | Freq: Once | ORAL | 0 refills | Status: AC

## 2020-07-15 NOTE — ED Notes (Signed)
RN found pt w/ O2 sat at 86% on RA, RN placed pt on 2L [place], after about 5 min O2 sat increased to 96%.

## 2020-07-15 NOTE — ED Notes (Signed)
Discharge instructions reviewed w/ pt and w/ caregiver at facility. Both verbalized understanding. Pt transported back to facility via [facility]

## 2020-07-15 NOTE — Discharge Instructions (Addendum)
Please follow up CLOSELY with your primary care doctor to reassess your kidney health, recheck your urine, and follow up on today's visit  [place]. Irregular bowel habits such as constipation and diarrhea can lead to many problems over time.  Having one soft bowel movement a day is the most important way to prevent further problems.  The anorectal canal is designed to handle stretching and feces to safely manage our ability to get rid of solid waste (feces, poop, stool) out of our body.  BUT, hard constipated stools can act like ripping concrete bricks and diarrhea can be a burning fire to this very sensitive area of our body, causing inflamed hemorrhoids, anal fissures, increasing risk is perirectal abscesses, abdominal pain/bloating, an making irritable bowel worse.     The goal: ONE SOFT BOWEL MOVEMENT A DAY!  To have soft, regular bowel movements:  Drink at least 8 tall glasses of water a day.   Take plenty of fiber.  Fiber is the undigested part of plant food that passes into the colon, acting s "natures broom" to encourage bowel motility and movement.  Fiber can absorb and hold large amounts of water. This results in a larger, bulkier stool, which is soft and easier to pass. Work gradually over several weeks up to 6 servings a day of fiber (25g a day even more if needed) in the form of: Vegetables -- Root (potatoes, carrots, turnips), leafy green (lettuce, salad greens, celery, spinach), or cooked high residue (cabbage, broccoli, etc) Fruit -- Fresh (unpeeled skin & pulp), Dried (prunes, apricots, cherries, etc ),  or stewed ( applesauce)  Whole grain breads, pasta, etc (whole wheat)  Bran cereals  Bulking Agents -- This type of water-retaining fiber generally is easily obtained each day by one of the following:  Psyllium bran -- The psyllium plant is remarkable because its ground seeds can retain so much water. This product is available as Metamucil, Konsyl, Effersyllium, Per  Diem Fiber, or the less expensive generic preparation in drug and health food stores. Although labeled a laxative, it really is not a laxative.  Methylcellulose -- This is another fiber derived from wood which also retains water. It is available as Citrucel. Polyethylene Glycol - and "artificial" fiber commonly called Miralax or Glycolax.  It is helpful for people with gassy or bloated feelings with regular fiber Flax Seed - a less gassy fiber than psyllium No reading or other relaxing activity while on the toilet. If bowel movements take longer than 5 minutes, you are too constipated AVOID CONSTIPATION.  High fiber and water intake usually takes care of this.  Sometimes a laxative is needed to stimulate more frequent bowel movements, but  Laxatives are not a good long-term solution as it can wear the colon out. Osmotics (Milk of Magnesia, Fleets phosphosoda, Magnesium citrate, MiraLax, GoLytely) are safer than  Stimulants (Senokot, Castor Oil, Dulcolax, Ex Lax)    Do not take laxatives for more than 7days in a row.  IF SEVERELY CONSTIPATED, try a Bowel Retraining Program: Do not use laxatives.  Eat a diet high in roughage, such as bran cereals and leafy vegetables.  Drink six (6) ounces of prune or apricot juice each morning.  Eat two (2) large servings of stewed fruit each day.  Take one (1) heaping tablespoon of a psyllium-based bulking agent twice a day. Use sugar-free sweetener when possible to avoid excessive calories.  Eat a normal breakfast.  Set aside 15 minutes after breakfast to sit  on the toilet, but do not strain to have a bowel movement.  If you do not have a bowel movement by the third day, use an enema and repeat the above steps.  Controlling diarrhea Switch to liquids and simpler foods for a few days to avoid stressing your intestines further. Avoid dairy products (especially milk & ice cream) for a short time.  The intestines often can lose the ability to digest lactose when  stressed. Avoid foods that cause gassiness or bloating.  Typical foods include beans and other legumes, cabbage, broccoli, and dairy foods.  Every person has some sensitivity to other foods, so listen to our body and avoid those foods that trigger problems for you. Adding fiber (Citrucel, Metamucil, psyllium, Miralax) gradually can help thicken stools by absorbing excess fluid and retrain the intestines to act more normally.  Slowly increase the dose over a few weeks.  Too much fiber too soon can backfire and cause cramping & bloating. Probiotics (such as active yogurt, Align, etc) may help repopulate the intestines and colon with normal bacteria and calm down a sensitive digestive tract.  Most studies show it to be of mild help, though, and such products can be costly. Medicines: Bismuth subsalicylate (ex. Kayopectate, Pepto Bismol) every 30 minutes for up to 6 doses can help control diarrhea.  Avoid if pregnant. Loperamide (Immodium) can slow down diarrhea.  Start with two tablets (4mg  total) first and then try one tablet every 6 hours.  Avoid if you are having fevers or severe pain.  If you are not better or start feeling worse, stop all medicines and call your doctor for advice Call your doctor if you are getting worse or not better.  Sometimes further testing (cultures, endoscopy, X-ray studies, bloodwork, etc) may be needed to help diagnose and treat the cause of the diarrhea.  Managing Pain  Pain after surgery or related to activity is often due to strain/injury to muscle, tendon, nerves and/or incisions.  This pain is usually short-term and will improve in a few months.   Many people find it helpful to do the following things TOGETHER to help speed the process of healing and to get back to regular activity more quickly:  Avoid heavy physical activity  no lifting greater than 20 pounds Do not "push through" the pain.  Listen to your body and avoid positions and maneuvers than reproduce the  pain Walking is okay as tolerated, but go slowly and stop when getting sore.  Remember: If it hurts to do it, then don't do it! Take Anti-inflammatory medication  Take with food/snack around the clock for 1-2 weeks This helps the muscle and nerve tissues become less irritable and calm down faster Choose ONE of the following over-the-counter medications: Naproxen 220mg  tabs (ex. Aleve) 1-2 pills twice a day  Ibuprofen 200mg  tabs (ex. Advil, Motrin) 3-4 pills with every meal and just before bedtime Acetaminophen 500mg  tabs (Tylenol) 1-2 pills with every meal and just before bedtime Use a Heating pad or Ice/Cold Pack 4-6 times a day May use warm bath/hottub  or showers Try Gentle Massage and/or Stretching  at the area of pain many times a day stop if you feel pain - do not overdo it  Try these steps together to help you body heal faster and avoid making things get worse.  Doing just one of these things may not be enough.    If you are not getting better after two weeks or are noticing you are getting worse,  contact our office for further advice; we may need to re-evaluate you & see what other things we can do to help.

## 2020-07-15 NOTE — ED Triage Notes (Signed)
Pt here from [place] with c/o syncopal episode times 2 once in the ned and once sitting on toilet pt arrives to the ED alert and oriented , pt abd is distended

## 2020-07-15 NOTE — ED Notes (Signed)
Pt tolerated swallowing fluids well.

## 2020-07-15 NOTE — ED Notes (Signed)
Hooked patient back up to the monitor patient is resting with call bell in reach 

## 2020-07-15 NOTE — ED Notes (Signed)
[facility] called to transport pt 

## 2020-07-15 NOTE — ED Provider Notes (Addendum)
[place] EMERGENCY DEPARTMENT Provider Note   CSN: [id number] Arrival date & time: 07/15/20  1037     History No chief complaint on file.   Victoria Winters is a 69 y.o. female.  HPI Patient is a 69 year old female with a history of DM on insulin, hallucinations, memory loss, recurrent UTIs, CKD 5 history of stroke HFpEF on diuretics.  Patient is coming in from skilled nursing facility for abdominal pain.  She states that is been ongoing for 20 days it is generalized described as pressure and she states it is present more often than it is absent but it is not constant.  She states she feels it would get better if she could have a good bowel movement.  She denies any aggravating or mitigating factors however.  She has taken no medications for this.  She recently had surgery open reduction internal fixation of left tibia.   Patient states that she is having decreased urine recently. She did pee prior to my arrival at bedside but did not have purwick on therefore was unable to collect a sample.  Patient states that she has been considered for dialysis before but has not been on dialysis. She has seen by nephrology regularly per her sister.  Patient denies any fevers, chills, nausea or vomiting. She states she did have some abdominal pain earlier but states that it is now resolved.     Past Medical History:  Diagnosis Date  . Brain aneurysm   . Diabetes mellitus without complication ([place])   . Hallucinations    visual and auditory  . Memory loss   . Recurrent UTI   . Renal disorder   . Stage 5 chronic kidney disease ([place])   . Stroke [facility])     Patient Active Problem List   Diagnosis Date Noted  . Vascular dementia with behavior disturbance ([place]) 07/16/2020  . Obesity, Class III, BMI 40-49.9 (morbid obesity) ([place]) 07/16/2020  . AKI (acute kidney injury) ([place]) 06/12/2020  . Tibia/fibula fracture, left, closed, initial encounter 06/11/2020  . (HFpEF) heart failure  with preserved ejection fraction - Grade 2 diastolic dysfunction 59/93/5701  . Acute on chronic heart failure with preserved ejection fraction (HFpEF) --grade 2 diastolic dysfunction 77/93/9030  . Acute respiratory failure with hypoxia ([place]) 04/23/2020  . Heart failure ([place]) 04/22/2020  . Acute and chronic respiratory failure with hypoxia ([place]) 04/17/2020  . DNR (do not resuscitate) 04/17/2020  . Leukocytosis 04/17/2020  . Anemia in chronic kidney disease (CKD) 04/17/2020  . Closed fracture of distal end of right fibula with routine healing 11/23/2019  . Palliative care by specialist   . Goals of care, counseling/discussion   . History of CVA (cerebrovascular accident)   . Acute encephalopathy 07/21/2019  . Fall 07/20/2019  . CKD (chronic kidney disease) IV 07/20/2019  . Brain aneurysm 07/20/2019  . DM (diabetes mellitus) ([place]) 07/20/2019  . Bilateral fibular fractures 07/20/2019  . CVA (cerebral vascular accident) ([place]) 07/20/2019  . Hallucination 07/20/2019    Past Surgical History:  Procedure Laterality Date  . CHOLECYSTECTOMY    . SKIN GRAFT    . TIBIA IM NAIL INSERTION Left 06/11/2020   Procedure: OPEN REDUCTION INTERNAL FIXATION LEFT TIBIA;  Surgeon: Wylene Simmer, MD;  Location: [place];  Service: Orthopedics;  Laterality: Left;     OB History    Gravida  2   Para  2   Term  2   Preterm      AB  Living        SAB      TAB      Ectopic      Multiple      Live Births              Family History  Problem Relation Age of Onset  . Heart disease Mother   . Diabetes Mellitus II Mother   . Heart disease Father   . Diabetes Father   . Heart disease Brother   . Cancer Other     Social History   Tobacco Use  . Smoking status: Former Research scientist (life sciences)  . Smokeless tobacco: Never Used  Vaping Use  . Vaping Use: Never used  Substance Use Topics  . Alcohol use: Yes    Comment: rarely  . Drug use: Never    Home Medications Prior to Admission medications     Medication Sig Start Date End Date Taking? Authorizing Provider  aspirin EC 81 MG tablet Take 1 tablet (81 mg total) by mouth 2 (two) times daily. 06/12/20  Yes Corky Sing, PA-C  atorvastatin (LIPITOR) 20 MG tablet Take 20 mg by mouth at bedtime.  09/29/19  Yes [provider]  cetirizine (ZYRTEC) 10 MG tablet Take 10 mg by mouth at bedtime.    Yes [provider]  escitalopram (LEXAPRO) 10 MG tablet Take 10 mg by mouth every morning.   Yes [provider]  fludrocortisone (FLORINEF) 0.1 MG tablet Take 0.2 mg by mouth daily.  03/19/20  Yes [provider]  hydrALAZINE (APRESOLINE) 25 MG tablet Take 1 tablet (25 mg total) by mouth 3 (three) times daily. 04/25/20  Yes Emokpae, Courage, MD  insulin glargine (LANTUS) 100 UNIT/ML injection Inject 0.1 mLs (10 Units total) into the skin at bedtime. 07/29/19  Yes Kathie Dike, MD  insulin lispro (HUMALOG) 100 UNIT/ML injection Inject 4-12 Units into the skin 3 (three) times daily before meals. Per sliding scale 180-220= 4 units 221-260= 6 units 261-300= 8 units 301-340=10units 341-380=12units   Yes [provider]  linagliptin (TRADJENTA) 5 MG TABS tablet Take 5 mg by mouth every morning.   Yes [provider]  melatonin 5 MG TABS Take 5 mg by mouth at bedtime.    Yes [provider]  metoprolol succinate (TOPROL-XL) 100 MG 24 hr tablet Take 100 mg by mouth every morning.    Yes [provider]  midodrine (PROAMATINE) 10 MG tablet Take 1 tablet (10 mg total) by mouth 3 (three) times daily with meals. 04/25/20  Yes Roxan Hockey, MD  Multiple Vitamin (MULTIVITAMIN WITH MINERALS) TABS tablet Take 1 tablet by mouth daily.   Yes [provider]  oxyCODONE (OXY IR/ROXICODONE) 5 MG immediate release tablet Take 5 mg by mouth every 4 (four) hours as needed for severe pain.   Yes [provider]  potassium chloride SA (KLOR-CON) 20 MEQ tablet Take 2 tablets (40  mEq total) by mouth daily. 06/07/20  Yes Branch, Alphonse Guild, MD  pregabalin (LYRICA) 75 MG capsule Take 1 capsule (75 mg total) by mouth 2 (two) times daily. 06/14/20 07/16/21 Yes Barb Merino, MD  QUEtiapine (SEROQUEL) 25 MG tablet Take 1 tablet (25 mg total) by mouth at bedtime. 07/29/19  Yes Kathie Dike, MD  senna (SENOKOT) 8.6 MG TABS tablet Take 2 tablets (17.2 mg total) by mouth 2 (two) times daily. 06/12/20  Yes Corky Sing, PA-C  torsemide (DEMADEX) 20 MG tablet Take 3 tablets (60 mg total) by mouth 2 (  two) times daily. 06/07/20 09/05/20 Yes BranchAlphonse Guild, MD  EPINEPHrine 0.3 mg/0.3 mL IJ SOAJ injection Inject 0.3 mg into the muscle daily as needed for anaphylaxis.    [provider]  OXYGEN Inhale 2 L/min into the lungs continuous as needed (shortness of breath/desaturation).    [provider]  polyethylene glycol powder (GLYCOLAX/MIRALAX) 17 GM/SCOOP powder Take 17 g by mouth every evening.    [provider]    Allergies    Penicillins, Chicken allergy, Contrast media [iodinated diagnostic agents], Eggs or egg-derived products, Milk-related compounds, Nickel, Oatmeal, Other, Vaccinium angustifolium, and Wheat bran  Review of Systems   Review of Systems  Constitutional: Positive for fatigue. Negative for chills and fever.  HENT: Negative for congestion.   Eyes: Negative for pain.  Respiratory: Negative for cough and shortness of breath.   Cardiovascular: Negative for chest pain and leg swelling.  Gastrointestinal: Positive for abdominal pain and constipation. Negative for diarrhea, nausea and vomiting.  Genitourinary: Positive for decreased urine volume. Negative for dyspareunia, dysuria, vaginal bleeding, vaginal discharge and vaginal pain.  Musculoskeletal: Negative for myalgias.  Skin: Negative for rash.  Neurological: Negative for dizziness and headaches.    Physical Exam Updated Vital Signs BP (!) 147/68 (BP Location: Right Arm)   Pulse  71   Temp 99.3 F (37.4 C) (Oral)   Resp 18   SpO2 92%   Physical Exam Vitals and nursing note reviewed.  Constitutional:      General: She is not in acute distress.    Appearance: She is obese.     Comments: Morbidly obese female, pleasant. Interactive in exam, able to answer questions the best of her ability.  HENT:     Head: Normocephalic and atraumatic.     Nose: Nose normal.     Mouth/Throat:     Mouth: Mucous membranes are dry.  Eyes:     General: No scleral icterus. Cardiovascular:     Rate and Rhythm: Normal rate and regular rhythm.     Pulses: Normal pulses.     Heart sounds: Normal heart sounds.  Pulmonary:     Effort: Pulmonary effort is normal. No respiratory distress.     Breath sounds: No wheezing.  Abdominal:     Palpations: Abdomen is soft.     Tenderness: There is abdominal tenderness. There is no right CVA tenderness, left CVA tenderness, guarding or rebound.     Comments: Diffuse abdominal tenderness. Abdomen is protuberant but soft. There is 1 small area of periumbilical bruising no flank bruising. Bruising appears remote and is faint green/yellow in color. No tenderness over the area of bruising. Negative fluid wave. Areas of dullness and tympany with percussion.  Genitourinary:    Rectum: Normal.     Comments: No palpable stool in rectal vault. Some loose stool expressed with rectal exam. No hemorrhoids or fissures. Fecal occult negative. Musculoskeletal:     Cervical back: Normal range of motion.     Right lower leg: No edema.     Left lower leg: No edema.  Skin:    General: Skin is warm and dry.     Capillary Refill: Capillary refill takes less than 2 seconds.  Neurological:     Mental Status: She is alert. Mental status is at baseline.     Comments: Oriented to self, location, date of week. Answer is here to be 2020 and month to be October. Speech is fluent. Memory appears to be very poor however this is baseline  for her per sister.   Psychiatric:         Mood and Affect: Mood normal.        Behavior: Behavior normal.     ED Results / Procedures / Treatments   Labs (all labs ordered are listed, but only abnormal results are displayed) Labs Reviewed  COMPREHENSIVE METABOLIC PANEL - Abnormal; Notable for the following components:      Result Value   Chloride 97 (*)    Glucose, Bld 243 (*)    BUN 42 (*)    Creatinine, Ser 3.61 (*)    Calcium 8.2 (*)    Total Protein 5.7 (*)    Albumin 2.9 (*)    GFR calc non Af Amer 12 (*)    GFR calc Af Amer 14 (*)    All other components within normal limits  CBC WITH DIFFERENTIAL/PLATELET - Abnormal; Notable for the following components:   WBC 14.6 (*)    RBC 3.59 (*)    Hemoglobin 9.5 (*)    HCT 31.0 (*)    Neutro Abs 10.2 (*)    Monocytes Absolute 1.2 (*)    All other components within normal limits  URINALYSIS, ROUTINE W REFLEX MICROSCOPIC - Abnormal; Notable for the following components:   APPearance CLOUDY (*)    Glucose, UA 100 (*)    Hgb urine dipstick SMALL (*)    Protein, ur 100 (*)    Leukocytes,Ua LARGE (*)    All other components within normal limits  URINALYSIS, MICROSCOPIC (REFLEX) - Abnormal; Notable for the following components:   Bacteria, UA FEW (*)    Non Squamous Epithelial PRESENT (*)    All other components within normal limits  LIPASE, BLOOD  POC OCCULT BLOOD, ED    EKG EKG Interpretation  Date/Time:  Monday July 15 2020 10:47:09 EDT Ventricular Rate:  68 PR Interval:    QRS Duration: 95 QT Interval:  457 QTC Calculation: 487 R Axis:   95 Text Interpretation: Sinus rhythm Probable left atrial enlargement Right axis deviation Borderline T wave abnormalities Borderline prolonged QT interval no significant change since May 2021 Confirmed by Sherwood Gambler [phone]) on 07/15/2020 10:59:35 AM Also confirmed by Sherwood Gambler [phone]), editor Hattie Perch [phone])  on 07/15/2020 12:13:14 PM   Radiology CT ABDOMEN PELVIS WO CONTRAST  Result Date:  07/15/2020 CLINICAL DATA:  Abdominal distension. EXAM: CT ABDOMEN AND PELVIS WITHOUT CONTRAST TECHNIQUE: Multidetector CT imaging of the abdomen and pelvis was performed following the standard protocol without IV contrast. COMPARISON:  CT lumbar spine 09/05/2019 FINDINGS: Lower chest: Minimal linear scarring/atelectasis left base. Mild cardiomegaly. Calcified plaque over the left anterior descending and lateral circumflex coronary arteries. Calcified plaque over the thoracic aorta. Possible 1.5 cm right infrahilar lymph node. Hepatobiliary: Previous cholecystectomy. Liver and biliary tree are normal. Pancreas: Normal. Spleen: Normal. Adrenals/Urinary Tract: Adrenal glands are normal. Kidneys are normal size without hydronephrosis or nephrolithiasis. Ureters are normal. Bladder is mildly distended. Stomach/Bowel: Stomach and small bowel are normal. Appendix is normal. Moderate air and stool throughout the colon with moderate fecal retention over the sigmoid colon. There is a redundant sigmoid colon. Minimal wall thickening of the rectosigmoid colon which could be seen with mild stercoral colitis. Minimal diverticulosis of the colon. Vascular/Lymphatic: Mild calcified plaque over the abdominal aorta which is normal in caliber. Few small nonspecific periaortic lymph nodes. Reproductive: Normal. Uterus and ovaries displaced anteriorly and superiorly by the air and stool filled rectosigmoid colon Other: No free peritoneal fluid or free  peritoneal air. No focal inflammatory change. Tiny umbilical hernia containing only peritoneal fat. Musculoskeletal: Degenerative changes of the spine. Mild stable compression fracture of L2. IMPRESSION: 1. Minimal wall thickening of the rectosigmoid colon with associated moderate fecal retention. Findings may be due to mild stercoral colitis. Diverticulosis of the colon without evidence of diverticulitis. 2. Aortic Atherosclerosis (ICD10-I70.0). Atherosclerotic coronary artery disease.  Mild cardiomegaly. 3.  Small umbilical hernia containing only peritoneal fat. 4.  Stable moderate L2 compression fracture. Electronically Signed   By: Marin Olp M.D.   On: 07/15/2020 14:30   CT Head Wo Contrast  Result Date: 07/15/2020 CLINICAL DATA:  Syncopal episode EXAM: CT HEAD WITHOUT CONTRAST TECHNIQUE: Contiguous axial images were obtained from the base of the skull through the vertex without intravenous contrast. COMPARISON:  06/11/2020 FINDINGS: Brain: There is no acute intracranial hemorrhage, mass effect, or edema. Gray-white differentiation is preserved. There is no extra-axial fluid collection. Ventricles and sulci are stable in size and configuration. Chronic infarcts of the basal ganglia and adjacent white matter bilaterally. Additional patchy hypoattenuation in the supratentorial white matter is nonspecific but may reflect mild chronic microvascular ischemic changes similar to the prior study. Vascular: There is atherosclerotic calcification at the skull base. Skull: Calvarium is unremarkable. Sinuses/Orbits: No acute finding. Other: None. IMPRESSION: No acute intracranial abnormality. Stable chronic findings detailed above. Electronically Signed   By: Macy Mis M.D.   On: 07/15/2020 11:53   DG Abd Acute W/Chest  Result Date: 07/16/2020 CLINICAL DATA:  Abdominal distension. EXAM: DG ABDOMEN ACUTE W/ 1V CHEST COMPARISON:  CT abdomen pelvis from yesterday. FINDINGS: Unchanged cardiomegaly. Normal pulmonary vascularity. No focal consolidation, pleural effusion, or pneumothorax. No acute osseous abnormality. 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 There is no evidence of dilated bowel loops or free intraperitoneal air. Large amount of stool throughout the colon. No radiopaque calculi or other significant radiographic abnormality is seen. IMPRESSION: 1. Prominent stool throughout the colon. Correlate for constipation.  2. No acute cardiopulmonary disease. Electronically Signed   By: Titus Dubin M.D.   On: 07/16/2020 10:03   DG Abdomen Acute W/Chest  Result Date: 07/15/2020 CLINICAL DATA:  Abdominal distension. EXAM: DG ABDOMEN ACUTE W/ 1V CHEST COMPARISON:  Chest x-ray 06/11/2020 FINDINGS: The heart is enlarged but stable. Mild central vascular congestion but no edema, infiltrates or effusions. Stable eventration of the right hemidiaphragm. The bony thorax is intact. Large amount of stool and air throughout the colon and down into the rectum suggesting constipation and fecal impaction. No distended small bowel loops to suggest obstruction. No free air. No worrisome calcifications. The bony structures are intact. IMPRESSION: 1. Stable cardiac enlargement and mild central vascular congestion. 2. Large amount of stool and air throughout the colon suggesting constipation and fecal impaction. 3. No findings for small bowel obstruction or free air. Electronically Signed   By: Marijo Sanes M.D.   On: 07/15/2020 11:33    Procedures Procedures (including critical care time)  Medications Ordered in ED Medications  sorbitol, milk of mag, mineral oil, glycerin (SMOG) enema (960 mLs Rectal Given 07/15/20 1633)    ED Course  I have reviewed the triage vital signs and the nursing notes.  Pertinent labs & imaging results that were available during my care of the patient were reviewed by me and considered in my medical decision making (see chart for details).  Patient is 69 year old female with past medical history detailed above presented today for constipation.  Physical  exam is notable for distended abdomen. She also has mild generalized tenderness to palpation with no focal tenderness. Mild well-healing bruise in the periumbilical region that is approximately 3 cm in diameter.  Patient does have CBC with mild leukocytosis. Anemia present she does appear dehydrated but her hemoglobin is somewhat improved from her  baseline likely secondary to dehydration. CMP notable for hyperglycemia with blood sugar of 243 patient is diabetic and states that she is taking her insulin as prescribed. She has no anion gap and her CO2 is within normal limits she is not in DKA or HHS. She is mentating at her baseline per her sister. BUN and creatinine are significantly elevated but appears that her creatinine has been slowly and intermittently increasing over the past several months. Has had these regularly checked by her nephrologist who has had serial creatinine checks. Over the past 2 months it is increased from 2.5-3.2.  Urinalysis obtained by nursing staff with very small sample from catheterization after patient has wet the bed. Patient states that she has no dysuria, frequency or urgency. She does state that she has had some decreased urine. This is likely from her dehydration. There is glucose present in urine which is consistent with patient's hyperglycemia, there is proteins with leukocytes and few bacteria and some non-squamous epithelial cells present.  I discussed all of his results with patient as it appears that she has slowly decreasing renal function will likely need close nephrology follow-up for admission for nephrology consultation.  I attempted manual disimpaction which was unsuccessful. She did have some diarrhea.  Plain film of chest and abdomen to evaluate for free air under the diaphragm patient does have tympany with percussion and protuberant abdomen. There is no evidence of perforation. IMPRESSION:  1. Stable cardiac enlargement and mild central vascular congestion.  2. Large amount of stool and air throughout the colon suggesting  constipation and fecal impaction.  3. No findings for small bowel obstruction or free air.   CT head obtained because of patient's baseline altered mental status. There are no acute abnormalities no evidence of bleed severe mass-effect. No hemorrhage. After CT head was  obtained patient sister at bedside confirms that this is patient's baseline mental status.  Clinical Course as of Jul 17 949  [facility] Jul 15, 2020  1515 CT w/o contrast obtained of abd without any acute abnormalities. Mild colitis likely inflammation dt constipation. Dc with my attending. Seeing as disimpaction was unsuccessful will provide pt with smog enema.    [WF]    Clinical Course User Index [WF] Tedd Sias, PA   I discussed patient's elevated creatinine and BUN with her --sister has left for the night. Patient is understanding that she is trending towards needing dialysis. I offered patient admission considering her abnormal kidney function labs today. She states that she prefer to go of abdomen chest to. She is feeling much better after enema. I informed her that she needs close follow-up of her primary care doctor and nephrologist. Also recommended gastroenterology follow-up given her severe constipation. She is unable to tell me whether she is taking any pain medicines however with her recent surgery I suspect this is some degree of constipation due to something medicine that she is provided. CT scan shows no evidence of ileus or obstruction. Smog enema was mildly successful and patient is symptomatically improved. As she is deferring admission for AKI we'll discharge with close follow-up with nephrology. Patient can return precautions. I discussed this case with my attending  physician who cosigned this note including patient's presenting symptoms, physical exam, and planned diagnostics and interventions. Attending physician stated agreement with plan or made changes to plan which were implemented.   MDM Rules/Calculators/A&P                          Final Clinical Impression(s) / ED Diagnoses Final diagnoses:  Constipation, unspecified constipation type  Abnormal urinalysis    Rx / DC Orders ED Discharge Orders         Ordered    polyethylene glycol powder (GLYCOLAX/MIRALAX) 17  GM/SCOOP powder   Once     Reprint     07/15/20 1810           Tedd Sias, PA 07/17/20 0950    Tedd Sias, PA 07/18/20 1143    Sherwood Gambler, MD 07/24/20 [phone]

## 2020-07-16 ENCOUNTER — Inpatient Hospital Stay (HOSPITAL_COMMUNITY)
Admission: EM | Admit: 2020-07-16 | Discharge: 2020-07-19 | DRG: 683 | Disposition: A | Discharge status: Skilled Nursing Facility | Payer: Medicare Other | Source: Skilled Nursing Facility | Attending: Internal Medicine | Admitting: Internal Medicine

## 2020-07-16 ENCOUNTER — Other Ambulatory Visit: Payer: Self-pay

## 2020-07-16 ENCOUNTER — Emergency Department (HOSPITAL_COMMUNITY): Payer: Medicare Other

## 2020-07-16 DIAGNOSIS — F0151 Vascular dementia with behavioral disturbance: Secondary | ICD-10-CM | POA: Diagnosis present

## 2020-07-16 DIAGNOSIS — E2740 Unspecified adrenocortical insufficiency: Secondary | ICD-10-CM | POA: Diagnosis present

## 2020-07-16 DIAGNOSIS — I5032 Chronic diastolic (congestive) heart failure: Secondary | ICD-10-CM | POA: Diagnosis present

## 2020-07-16 DIAGNOSIS — Z833 Family history of diabetes mellitus: Secondary | ICD-10-CM

## 2020-07-16 DIAGNOSIS — Z9981 Dependence on supplemental oxygen: Secondary | ICD-10-CM

## 2020-07-16 DIAGNOSIS — N179 Acute kidney failure, unspecified: Principal | ICD-10-CM

## 2020-07-16 DIAGNOSIS — I5030 Unspecified diastolic (congestive) heart failure: Secondary | ICD-10-CM | POA: Diagnosis present

## 2020-07-16 DIAGNOSIS — Z6841 Body Mass Index (BMI) 40.0 and over, adult: Secondary | ICD-10-CM | POA: Diagnosis not present

## 2020-07-16 DIAGNOSIS — N184 Chronic kidney disease, stage 4 (severe): Secondary | ICD-10-CM | POA: Diagnosis present

## 2020-07-16 DIAGNOSIS — Z7982 Long term (current) use of aspirin: Secondary | ICD-10-CM

## 2020-07-16 DIAGNOSIS — Z91012 Allergy to eggs: Secondary | ICD-10-CM | POA: Diagnosis not present

## 2020-07-16 DIAGNOSIS — G8929 Other chronic pain: Secondary | ICD-10-CM | POA: Diagnosis present

## 2020-07-16 DIAGNOSIS — E1165 Type 2 diabetes mellitus with hyperglycemia: Secondary | ICD-10-CM

## 2020-07-16 DIAGNOSIS — I130 Hypertensive heart and chronic kidney disease with heart failure and stage 1 through stage 4 chronic kidney disease, or unspecified chronic kidney disease: Secondary | ICD-10-CM | POA: Diagnosis present

## 2020-07-16 DIAGNOSIS — Z66 Do not resuscitate: Secondary | ICD-10-CM | POA: Diagnosis present

## 2020-07-16 DIAGNOSIS — Z515 Encounter for palliative care: Secondary | ICD-10-CM | POA: Diagnosis present

## 2020-07-16 DIAGNOSIS — Z91011 Allergy to milk products: Secondary | ICD-10-CM | POA: Diagnosis not present

## 2020-07-16 DIAGNOSIS — K529 Noninfective gastroenteritis and colitis, unspecified: Secondary | ICD-10-CM | POA: Diagnosis not present

## 2020-07-16 DIAGNOSIS — Z91018 Allergy to other foods: Secondary | ICD-10-CM

## 2020-07-16 DIAGNOSIS — Z880 Allergy status to penicillin: Secondary | ICD-10-CM

## 2020-07-16 DIAGNOSIS — Z87891 Personal history of nicotine dependence: Secondary | ICD-10-CM

## 2020-07-16 DIAGNOSIS — E8770 Fluid overload, unspecified: Secondary | ICD-10-CM | POA: Diagnosis present

## 2020-07-16 DIAGNOSIS — S82392D Other fracture of lower end of left tibia, subsequent encounter for closed fracture with routine healing: Secondary | ICD-10-CM

## 2020-07-16 DIAGNOSIS — I69311 Memory deficit following cerebral infarction: Secondary | ICD-10-CM

## 2020-07-16 DIAGNOSIS — S92353D Displaced fracture of fifth metatarsal bone, unspecified foot, subsequent encounter for fracture with routine healing: Secondary | ICD-10-CM

## 2020-07-16 DIAGNOSIS — R0902 Hypoxemia: Secondary | ICD-10-CM | POA: Diagnosis present

## 2020-07-16 DIAGNOSIS — Z79899 Other long term (current) drug therapy: Secondary | ICD-10-CM

## 2020-07-16 DIAGNOSIS — E119 Type 2 diabetes mellitus without complications: Secondary | ICD-10-CM

## 2020-07-16 DIAGNOSIS — N189 Chronic kidney disease, unspecified: Secondary | ICD-10-CM | POA: Diagnosis present

## 2020-07-16 DIAGNOSIS — E876 Hypokalemia: Secondary | ICD-10-CM | POA: Diagnosis present

## 2020-07-16 DIAGNOSIS — Z8249 Family history of ischemic heart disease and other diseases of the circulatory system: Secondary | ICD-10-CM

## 2020-07-16 DIAGNOSIS — Z9109 Other allergy status, other than to drugs and biological substances: Secondary | ICD-10-CM

## 2020-07-16 DIAGNOSIS — Z794 Long term (current) use of insulin: Secondary | ICD-10-CM

## 2020-07-16 DIAGNOSIS — D631 Anemia in chronic kidney disease: Secondary | ICD-10-CM | POA: Diagnosis present

## 2020-07-16 DIAGNOSIS — F01518 Vascular dementia, unspecified severity, with other behavioral disturbance: Secondary | ICD-10-CM | POA: Diagnosis present

## 2020-07-16 DIAGNOSIS — Z20822 Contact with and (suspected) exposure to covid-19: Secondary | ICD-10-CM | POA: Diagnosis present

## 2020-07-16 DIAGNOSIS — E785 Hyperlipidemia, unspecified: Secondary | ICD-10-CM | POA: Diagnosis present

## 2020-07-16 DIAGNOSIS — K5900 Constipation, unspecified: Secondary | ICD-10-CM

## 2020-07-16 DIAGNOSIS — Z7189 Other specified counseling: Secondary | ICD-10-CM

## 2020-07-16 DIAGNOSIS — R443 Hallucinations, unspecified: Secondary | ICD-10-CM | POA: Diagnosis present

## 2020-07-16 DIAGNOSIS — S92343D Displaced fracture of fourth metatarsal bone, unspecified foot, subsequent encounter for fracture with routine healing: Secondary | ICD-10-CM

## 2020-07-16 DIAGNOSIS — Z87440 Personal history of urinary (tract) infections: Secondary | ICD-10-CM

## 2020-07-16 LAB — URINALYSIS, ROUTINE W REFLEX MICROSCOPIC
Bilirubin Urine: NEGATIVE
Glucose, UA: 50 mg/dL — AB
Hgb urine dipstick: NEGATIVE
Ketones, ur: NEGATIVE mg/dL
Leukocytes,Ua: NEGATIVE
Nitrite: NEGATIVE
Protein, ur: 100 mg/dL — AB
Specific Gravity, Urine: 1.01 (ref 1.005–1.030)
pH: 5 (ref 5.0–8.0)

## 2020-07-16 LAB — COMPREHENSIVE METABOLIC PANEL
ALT: 12 U/L (ref 0–44)
AST: 16 U/L (ref 15–41)
Albumin: 2.9 g/dL — ABNORMAL LOW (ref 3.5–5.0)
Alkaline Phosphatase: 91 U/L (ref 38–126)
Anion gap: 14 (ref 5–15)
BUN: 53 mg/dL — ABNORMAL HIGH (ref 8–23)
CO2: 29 mmol/L (ref 22–32)
Calcium: 8.4 mg/dL — ABNORMAL LOW (ref 8.9–10.3)
Chloride: 98 mmol/L (ref 98–111)
Creatinine, Ser: 4.07 mg/dL — ABNORMAL HIGH (ref 0.44–1.00)
GFR calc Af Amer: 12 mL/min — ABNORMAL LOW (ref >60)
GFR calc non Af Amer: 11 mL/min — ABNORMAL LOW (ref >60)
Glucose, Bld: 152 mg/dL — ABNORMAL HIGH (ref 70–99)
Potassium: 4.1 mmol/L (ref 3.5–5.1)
Sodium: 141 mmol/L (ref 135–145)
Total Bilirubin: 0.9 mg/dL (ref 0.3–1.2)
Total Protein: 6.2 g/dL — ABNORMAL LOW (ref 6.5–8.1)

## 2020-07-16 LAB — CBC WITH DIFFERENTIAL/PLATELET
Abs Immature Granulocytes: 0.03 10*3/uL (ref 0.00–0.07)
Basophils Absolute: 1 10*3/uL — ABNORMAL HIGH (ref 0.0–0.1)
Basophils Relative: 1 %
Eosinophils Absolute: 0.2 10*3/uL (ref 0.0–0.5)
Eosinophils Relative: 2 %
HCT: 32.1 % — ABNORMAL LOW (ref 36.0–46.0)
Hemoglobin: 9.8 g/dL — ABNORMAL LOW (ref 12.0–15.0)
Immature Granulocytes: 0 %
Lymphocytes Relative: 27 %
Lymphs Abs: 2.6 10*3/uL (ref 0.7–4.0)
MCH: 26.8 pg (ref 26.0–34.0)
MCHC: 30.5 g/dL (ref 30.0–36.0)
MCV: 87.9 fL (ref 80.0–100.0)
Monocytes Absolute: 1.4 10*3/uL — ABNORMAL HIGH (ref 0.1–1.0)
Monocytes Relative: 15 %
Neutro Abs: 5.2 10*3/uL (ref 1.7–7.7)
Neutrophils Relative %: 55 %
Platelets: 190 10*3/uL (ref 150–400)
RBC: 3.65 MIL/uL — ABNORMAL LOW (ref 3.87–5.11)
RDW: 15.5 % (ref 11.5–15.5)
WBC: 9.5 10*3/uL (ref 4.0–10.5)
nRBC: 0 /100 WBC

## 2020-07-16 LAB — GLUCOSE, CAPILLARY: Glucose-Capillary: 140 mg/dL — ABNORMAL HIGH (ref 70–99)

## 2020-07-16 LAB — LIPASE, BLOOD: Lipase: 22 U/L (ref 11–51)

## 2020-07-16 LAB — SARS CORONAVIRUS 2 BY RT PCR (HOSPITAL ORDER, PERFORMED IN ~~LOC~~ HOSPITAL LAB): SARS Coronavirus 2: NEGATIVE

## 2020-07-16 MED ORDER — SORBITOL 70 % SOLN
30.0000 mL | Status: DC | PRN
  Administered 2020-07-19: 30 mL via ORAL
  Filled 2020-07-16: qty 30

## 2020-07-16 MED ORDER — ONDANSETRON HCL 4 MG/2ML IJ SOLN: 4.0000 mg | Freq: Four times a day (QID) | INTRAMUSCULAR | Status: DC | PRN

## 2020-07-16 MED ORDER — ONDANSETRON HCL 4 MG PO TABS: 4.0000 mg | ORAL_TABLET | Freq: Four times a day (QID) | ORAL | Status: DC | PRN

## 2020-07-16 MED ORDER — ESCITALOPRAM OXALATE 10 MG PO TABS
10.0000 mg | ORAL_TABLET | Freq: Every morning | ORAL | Status: DC
  Administered 2020-07-17 – 2020-07-19 (×3): 10 mg via ORAL
  Filled 2020-07-16 (×3): qty 1

## 2020-07-16 MED ORDER — NEPRO/CARBSTEADY PO LIQD
237.0000 mL | Freq: Three times a day (TID) | ORAL | Status: DC | PRN
  Filled 2020-07-16: qty 237

## 2020-07-16 MED ORDER — ASPIRIN EC 81 MG PO TBEC
81.0000 mg | DELAYED_RELEASE_TABLET | Freq: Every day | ORAL | Status: DC
  Administered 2020-07-17 – 2020-07-19 (×3): 81 mg via ORAL
  Filled 2020-07-16 (×3): qty 1

## 2020-07-16 MED ORDER — SODIUM CHLORIDE 0.9 % IV BOLUS
500.0000 mL | Freq: Once | INTRAVENOUS | Status: AC
  Administered 2020-07-16: 500 mL via INTRAVENOUS

## 2020-07-16 MED ORDER — PREGABALIN 75 MG PO CAPS
75.0000 mg | ORAL_CAPSULE | Freq: Two times a day (BID) | ORAL | Status: DC
  Administered 2020-07-16 – 2020-07-19 (×6): 75 mg via ORAL
  Filled 2020-07-16 (×6): qty 1

## 2020-07-16 MED ORDER — HYDROXYZINE HCL 25 MG PO TABS: 25.0000 mg | ORAL_TABLET | Freq: Three times a day (TID) | ORAL | Status: DC | PRN

## 2020-07-16 MED ORDER — SODIUM CHLORIDE 0.9 % IV SOLN: INTRAVENOUS | Status: DC

## 2020-07-16 MED ORDER — POLYETHYLENE GLYCOL 3350 17 G PO PACK
17.0000 g | PACK | Freq: Every day | ORAL | Status: DC
  Administered 2020-07-17 – 2020-07-18 (×2): 17 g via ORAL
  Filled 2020-07-16 (×2): qty 1

## 2020-07-16 MED ORDER — MORPHINE SULFATE (PF) 2 MG/ML IV SOLN: 2.0000 mg | Freq: Four times a day (QID) | INTRAVENOUS | Status: DC | PRN

## 2020-07-16 MED ORDER — ADULT MULTIVITAMIN W/MINERALS CH
1.0000 | ORAL_TABLET | Freq: Every day | ORAL | Status: DC
  Administered 2020-07-17 – 2020-07-19 (×3): 1 via ORAL
  Filled 2020-07-16 (×3): qty 1

## 2020-07-16 MED ORDER — SODIUM CHLORIDE 0.9 % IV SOLN
2.0000 g | INTRAVENOUS | Status: DC
  Administered 2020-07-16: 2 g via INTRAVENOUS
  Filled 2020-07-16: qty 20

## 2020-07-16 MED ORDER — ACETAMINOPHEN 325 MG PO TABS: 650.0000 mg | ORAL_TABLET | Freq: Four times a day (QID) | ORAL | Status: DC | PRN

## 2020-07-16 MED ORDER — QUETIAPINE FUMARATE 50 MG PO TABS
25.0000 mg | ORAL_TABLET | Freq: Every day | ORAL | Status: DC
  Administered 2020-07-16 – 2020-07-18 (×3): 25 mg via ORAL
  Filled 2020-07-16 (×4): qty 1

## 2020-07-16 MED ORDER — ENOXAPARIN SODIUM 30 MG/0.3ML ~~LOC~~ SOLN
30.0000 mg | SUBCUTANEOUS | Status: DC
  Administered 2020-07-16 – 2020-07-18 (×3): 30 mg via SUBCUTANEOUS
  Filled 2020-07-16 (×3): qty 0.3

## 2020-07-16 MED ORDER — HYDRALAZINE HCL 20 MG/ML IJ SOLN: 5.0000 mg | INTRAMUSCULAR | Status: DC | PRN

## 2020-07-16 MED ORDER — INSULIN GLARGINE 100 UNIT/ML ~~LOC~~ SOLN
10.0000 [IU] | Freq: Every day | SUBCUTANEOUS | Status: DC
  Administered 2020-07-17 – 2020-07-18 (×2): 10 [IU] via SUBCUTANEOUS
  Filled 2020-07-16 (×4): qty 0.1

## 2020-07-16 MED ORDER — DOCUSATE SODIUM 283 MG RE ENEM
1.0000 | ENEMA | RECTAL | Status: DC | PRN
  Filled 2020-07-16 (×2): qty 1

## 2020-07-16 MED ORDER — OXYCODONE HCL 5 MG PO TABS: 5.0000 mg | ORAL_TABLET | ORAL | Status: DC | PRN

## 2020-07-16 MED ORDER — CALCIUM CARBONATE ANTACID 1250 MG/5ML PO SUSP
500.0000 mg | Freq: Four times a day (QID) | ORAL | Status: DC | PRN
  Filled 2020-07-16: qty 5

## 2020-07-16 MED ORDER — CAMPHOR-MENTHOL 0.5-0.5 % EX LOTN
1.0000 "application " | TOPICAL_LOTION | Freq: Three times a day (TID) | CUTANEOUS | Status: DC | PRN
  Filled 2020-07-16: qty 222

## 2020-07-16 MED ORDER — SENNA 8.6 MG PO TABS
2.0000 | ORAL_TABLET | Freq: Two times a day (BID) | ORAL | Status: DC
  Administered 2020-07-16 – 2020-07-18 (×4): 17.2 mg via ORAL
  Filled 2020-07-16 (×4): qty 2

## 2020-07-16 MED ORDER — ATORVASTATIN CALCIUM 10 MG PO TABS
20.0000 mg | ORAL_TABLET | Freq: Every day | ORAL | Status: DC
  Administered 2020-07-16 – 2020-07-18 (×3): 20 mg via ORAL
  Filled 2020-07-16 (×3): qty 2

## 2020-07-16 MED ORDER — MELATONIN 5 MG PO TABS
5.0000 mg | ORAL_TABLET | Freq: Every day | ORAL | Status: DC
  Administered 2020-07-16 – 2020-07-18 (×3): 5 mg via ORAL
  Filled 2020-07-16 (×4): qty 1

## 2020-07-16 MED ORDER — ZOLPIDEM TARTRATE 5 MG PO TABS: 5.0000 mg | ORAL_TABLET | Freq: Every evening | ORAL | Status: DC | PRN

## 2020-07-16 MED ORDER — ACETAMINOPHEN 650 MG RE SUPP: 650.0000 mg | Freq: Four times a day (QID) | RECTAL | Status: DC | PRN

## 2020-07-16 MED ORDER — INSULIN ASPART 100 UNIT/ML ~~LOC~~ SOLN
0.0000 [IU] | Freq: Three times a day (TID) | SUBCUTANEOUS | Status: DC
  Administered 2020-07-17: 2 [IU] via SUBCUTANEOUS
  Administered 2020-07-17 – 2020-07-19 (×4): 3 [IU] via SUBCUTANEOUS
  Administered 2020-07-19: 2 [IU] via SUBCUTANEOUS

## 2020-07-16 MED ORDER — FLUDROCORTISONE ACETATE 0.1 MG PO TABS
0.2000 mg | ORAL_TABLET | Freq: Every day | ORAL | Status: DC
  Administered 2020-07-17 – 2020-07-19 (×3): 0.2 mg via ORAL
  Filled 2020-07-16 (×4): qty 2

## 2020-07-16 MED ORDER — LACTATED RINGERS IV SOLN: INTRAVENOUS | Status: DC

## 2020-07-16 MED ORDER — METRONIDAZOLE 500 MG PO TABS
500.0000 mg | ORAL_TABLET | Freq: Once | ORAL | Status: DC
  Filled 2020-07-16: qty 1

## 2020-07-16 MED ORDER — POLYETHYLENE GLYCOL 3350 17 GM/SCOOP PO POWD
17.0000 g | Freq: Every evening | ORAL | Status: DC
  Filled 2020-07-16: qty 255

## 2020-07-16 NOTE — ED Triage Notes (Addendum)
Pt arrives via ems from [facility] with abd distention, ams for several days and burning with urination. Given 150cc NS en route. Sepsis initiated en route. Pt seen here yesterday and discharged.  Cap 15 RR 30 HR 70 132/69 cbg 155 98.17F

## 2020-07-16 NOTE — H&P (Addendum)
History and Physical    [name] HYQ:[id number] DOB: [birth year] DOA: 07/16/2020  PCP: Alfonse Flavors, MD Consultants:  Doran Durand - orthopedics; [place]- cardiology; [facility] - nephrology Patient coming from: [facility]; NOK: Jonetta Speak, [phone]  Chief Complaint: abdominal pain  HPI: Victoria Winters is a 69 y.o. female with medical history significant of CVA: stage IV CKD; dementia; chronic diastolic CHF; and DM presenting with abdominal pain.  She was previously admitted from 7/6-9 with an acute comminuted fracture of the distal L tibia and fibula and fracture of the 4/5 metatarsal necks, s/p ORIF on 7/6.  She returned to the ER yesterday (8/9) for syncope x 2.  She described abdominal pain; CT showed fecal retention and possible mild stercoral colitis.   She was noted be hypoxic and was placed on 2L O2.  She was sent back to SNF but returned this AM with c/o abdominal distention, AMS.  She had a large BM while in radiology this AM.  AXR shows prominent stool throughout the colon.  The patient is unable to provide significant history.  She is uncertain why she is here, denies pain, and reports that she is unable to see her abdomen so doesn't know if it is distended.  She is oriented x 1.  I spoke with her sister.  She was at home prior to her last hospitalization.  She was having spells/drop attacks since 2019 and before.  At baseline, she has variable cognitive function - she has days where she can't communicate at all.  She has short-term memory loss and periodic hallucinations.  Her usual life is difficulty remembering short-term issues but had long-term memory.  Her functional status varied prior to hospitalization - goal was to use the walker to mobilize but she often wasn't able to get up without significant assistance and couldn't walker.  She is no longer able to lift her and she doesn't think she can care for her at home anymore.  She is not surprised about her  worsening renal function; she is not sure if she is a candidate or not but it was mentioned in the past.   She does not want her to suffer, to "needlessly live a life, she's just got so much wrong, I don't want to suffer."  She is not inclined to proceed with HD.  She doesn't want to scare her or present her with information.  She is open to palliative care but does not want her sister to know it because she fears that will scare her.      ED Course:  AKI, ?mild colitis.  In SNF, had abdominal pain and distention yesterday - sent to ER.  CT with constipation, ?bowel thickening, normal WBC.  Sent back with stool softeners.  Returned with the same.  On exam, TTP and distention.  No leukocytosis, no stool in rectal vault.  Creatinine 2.99, 3.6 yesterday, 4.07 today.  UA pending.  Started on antibiotics for possible colitis.  Needs IVF.  Review of Systems: As per HPI; otherwise review of systems reviewed and negative.  Very limited due to dementia.    Past Medical History:  Diagnosis Date  . Brain aneurysm   . Diabetes mellitus without complication ([place])   . Hallucinations    visual and auditory  . Memory loss   . Recurrent UTI   . Renal disorder   . Stage 5 chronic kidney disease ([place])   . Stroke [facility])     Past Surgical History:  Procedure  Laterality Date  . CHOLECYSTECTOMY    . SKIN GRAFT    . TIBIA IM NAIL INSERTION Left 06/11/2020   Procedure: OPEN REDUCTION INTERNAL FIXATION LEFT TIBIA;  Surgeon: Wylene Simmer, MD;  Location: [place];  Service: Orthopedics;  Laterality: Left;    Social History   Socioeconomic History  . Marital status: Single    Spouse name: Not on file  . Number of children: Not on file  . Years of education: Not on file  . Highest education level: Not on file  Occupational History  . Not on file  Tobacco Use  . Smoking status: Former Research scientist (life sciences)  . Smokeless tobacco: Never Used  Vaping Use  . Vaping Use: Never used  Substance and Sexual Activity  . Alcohol  use: Yes    Comment: rarely  . Drug use: Never  . Sexual activity: Not on file  Other Topics Concern  . Not on file  Social History Narrative  . Not on file   Social Determinants of Health   Financial Resource Strain:   . Difficulty of Paying Living Expenses:   Food Insecurity:   . Worried About Charity fundraiser in the Last Year:   . Arboriculturist in the Last Year:   Transportation Needs:   . Film/video editor (Medical):   Marland Kitchen Lack of Transportation (Non-Medical):   Physical Activity:   . Days of Exercise per Week:   . Minutes of Exercise per Session:   Stress:   . Feeling of Stress :   Social Connections:   . Frequency of Communication with Friends and Family:   . Frequency of Social Gatherings with Friends and Family:   . Attends Religious Services:   . Active Member of Clubs or Organizations:   . Attends Archivist Meetings:   Marland Kitchen Marital Status:   Intimate Partner Violence:   . Fear of Current or Ex-Partner:   . Emotionally Abused:   Marland Kitchen Physically Abused:   . Sexually Abused:     Allergies  Allergen Reactions  . Penicillins Anaphylaxis    Did it involve swelling of the face/tongue/throat, SOB, or low BP? Yes Did it involve sudden or severe rash/hives, skin peeling, or any reaction on the inside of your mouth or nose? Unknown Did you need to seek medical attention at a hospital or doctor's office? Unknown When did it last happen?Within past 10 years If all above answers are "NO", may proceed with cephalosporin use.   . Chicken Allergy Other (See Comments)    unk on mar  . Contrast Media [Iodinated Diagnostic Agents] Other (See Comments)    unk on mar  . Eggs Or Egg-Derived Products Swelling    Patient is allergic to egg yolk, but can eat egg white with no problems  . Milk-Related Compounds Other (See Comments)    unk on mar  . Nickel Other (See Comments)    unk on mar  . Oatmeal Other (See Comments)    unk on mar  . Other     Black  beans, blueberry, cats, dogs, grass clippings  . Vaccinium Angustifolium Hives  . Wheat Bran Other (See Comments)    unk on mar    Family History  Problem Relation Age of Onset  . Heart disease Mother   . Diabetes Mellitus II Mother   . Heart disease Father   . Diabetes Father   . Heart disease Brother   . Cancer Other     Prior  to Admission medications   Medication Sig Start Date End Date Taking? Authorizing Provider  aspirin EC 81 MG tablet Take 1 tablet (81 mg total) by mouth 2 (two) times daily. 06/12/20   Corky Sing, PA-C  atorvastatin (LIPITOR) 20 MG tablet Take 20 mg by mouth at bedtime.  09/29/19   [provider]  cetirizine (ZYRTEC) 10 MG tablet Take 10 mg by mouth at bedtime.     [provider]  docusate sodium (COLACE) 100 MG capsule Take 1 capsule (100 mg total) by mouth 2 (two) times daily. While taking narcotic pain medicine. Patient not taking: Reported on 07/15/2020 06/12/20   Corky Sing, PA-C  escitalopram (LEXAPRO) 10 MG tablet Take 10 mg by mouth every morning.    [provider]  fludrocortisone (FLORINEF) 0.1 MG tablet Take 2 tablets by mouth daily. 03/19/20   [provider]  hydrALAZINE (APRESOLINE) 25 MG tablet Take 1 tablet (25 mg total) by mouth 3 (three) times daily. 04/25/20   Roxan Hockey, MD  insulin glargine (LANTUS) 100 UNIT/ML injection Inject 0.1 mLs (10 Units total) into the skin at bedtime. 07/29/19   Kathie Dike, MD  insulin lispro (HUMALOG) 100 UNIT/ML injection Inject 4-12 Units into the skin 3 (three) times daily before meals. Sliding scale starting at; 180-220= 4 units 221-260= 6 units 261-300= 8 units 301-340=10units 341-380=12units     [provider]  linagliptin (TRADJENTA) 5 MG TABS tablet Take 5 mg by mouth every morning.    [provider]  melatonin 5 MG TABS Take 5 mg by mouth at bedtime.     [provider]  metoprolol succinate (TOPROL-XL) 100 MG 24 hr  tablet Take 100 mg by mouth every morning.     [provider]  midodrine (PROAMATINE) 10 MG tablet Take 1 tablet (10 mg total) by mouth 3 (three) times daily with meals. 04/25/20   Roxan Hockey, MD  Multiple Vitamin (MULTIVITAMIN WITH MINERALS) TABS tablet Take 1 tablet by mouth daily.    [provider]  oxyCODONE (OXY IR/ROXICODONE) 5 MG immediate release tablet Take 5 mg by mouth every 4 (four) hours as needed for severe pain.    [provider]  potassium chloride SA (KLOR-CON) 20 MEQ tablet Take 2 tablets (40 mEq total) by mouth daily. 06/07/20   Arnoldo Lenis, MD  pregabalin (LYRICA) 75 MG capsule Take 1 capsule (75 mg total) by mouth 2 (two) times daily. 06/14/20 07/15/20  Barb Merino, MD  QUEtiapine (SEROQUEL) 25 MG tablet Take 1 tablet (25 mg total) by mouth at bedtime. 07/29/19   Kathie Dike, MD  senna (SENOKOT) 8.6 MG TABS tablet Take 2 tablets (17.2 mg total) by mouth 2 (two) times daily. 06/12/20   Corky Sing, PA-C  torsemide (DEMADEX) 20 MG tablet Take 3 tablets (60 mg total) by mouth 2 (two) times daily. 06/07/20 09/05/20  Arnoldo Lenis, MD    Physical Exam: Vitals:   07/16/20 1530 07/16/20 1600 07/16/20 1630 07/16/20 1700  BP: (!) 120/54 (!) 109/46 (!) 115/44 (!) 118/47  Pulse: 72 66 70 68  Resp: 14 13 17 11   Temp:      TempSrc:      SpO2: 92% 91% 95% 96%     . General:  Appears calm and comfortable and is NAD . Eyes:  PERRL, EOMI, normal lids, iris . ENT:  grossly normal hearing, lips & tongue, mmm . Neck:  no LAD, masses or thyromegaly . Cardiovascular:  RRR, no m/r/g. No LE edema.  Marland Kitchen Respiratory:   CTA bilaterally with no wheezes/rales/rhonchi.  Normal respiratory effort. . Abdomen:  soft, NT, obese with ?distention, NABS . Skin:  no rash or induration seen on limited exam . Musculoskeletal: decreased tone BUE/BLE, bruising on R dorsal foot, L foot is in a short leg cast . Psychiatric:  detached mood and affect, speech  fluent and appropriate, AOx1 . Neurologic:  CN 2-12 grossly intact, moves all extremities in coordinated fashion    Radiological Exams on Admission: CT ABDOMEN PELVIS WO CONTRAST  Result Date: 07/15/2020 CLINICAL DATA:  Abdominal distension. EXAM: CT ABDOMEN AND PELVIS WITHOUT CONTRAST TECHNIQUE: Multidetector CT imaging of the abdomen and pelvis was performed following the standard protocol without IV contrast. COMPARISON:  CT lumbar spine 09/05/2019 FINDINGS: Lower chest: Minimal linear scarring/atelectasis left base. Mild cardiomegaly. Calcified plaque over the left anterior descending and lateral circumflex coronary arteries. Calcified plaque over the thoracic aorta. Possible 1.5 cm right infrahilar lymph node. Hepatobiliary: Previous cholecystectomy. Liver and biliary tree are normal. Pancreas: Normal. Spleen: Normal. Adrenals/Urinary Tract: Adrenal glands are normal. Kidneys are normal size without hydronephrosis or nephrolithiasis. Ureters are normal. Bladder is mildly distended. Stomach/Bowel: Stomach and small bowel are normal. Appendix is normal. Moderate air and stool throughout the colon with moderate fecal retention over the sigmoid colon. There is a redundant sigmoid colon. Minimal wall thickening of the rectosigmoid colon which could be seen with mild stercoral colitis. Minimal diverticulosis of the colon. Vascular/Lymphatic: Mild calcified plaque over the abdominal aorta which is normal in caliber. Few small nonspecific periaortic lymph nodes. Reproductive: Normal. Uterus and ovaries displaced anteriorly and superiorly by the air and stool filled rectosigmoid colon Other: No free peritoneal fluid or free peritoneal air. No focal inflammatory change. Tiny umbilical hernia containing only peritoneal fat. Musculoskeletal: Degenerative changes of the spine. Mild stable compression fracture of L2. IMPRESSION: 1. Minimal wall thickening of the rectosigmoid colon with associated moderate fecal  retention. Findings may be due to mild stercoral colitis. Diverticulosis of the colon without evidence of diverticulitis. 2. Aortic Atherosclerosis (ICD10-I70.0). Atherosclerotic coronary artery disease. Mild cardiomegaly. 3.  Small umbilical hernia containing only peritoneal fat. 4.  Stable moderate L2 compression fracture. Electronically Signed   By: Marin Olp M.D.   On: 07/15/2020 14:30   CT Head Wo Contrast  Result Date: 07/15/2020 CLINICAL DATA:  Syncopal episode EXAM: CT HEAD WITHOUT CONTRAST TECHNIQUE: Contiguous axial images were obtained from the base of the skull through the vertex without intravenous contrast. COMPARISON:  06/11/2020 FINDINGS: Brain: There is no acute intracranial hemorrhage, mass effect, or edema. Gray-white differentiation is preserved. There is no extra-axial fluid collection. Ventricles and sulci are stable in size and configuration. Chronic infarcts of the basal ganglia and adjacent white matter bilaterally. Additional patchy hypoattenuation in the supratentorial white matter is nonspecific but may reflect mild chronic microvascular ischemic changes similar to the prior study. Vascular: There is atherosclerotic calcification at the skull base. Skull: Calvarium is unremarkable. Sinuses/Orbits: No acute finding. Other: None. IMPRESSION: No acute intracranial abnormality. Stable chronic findings detailed above. Electronically Signed   By: Macy Mis M.D.   On: 07/15/2020 11:53   DG Abd Acute W/Chest  Result Date: 07/16/2020 CLINICAL DATA:  Abdominal distension. EXAM: DG ABDOMEN ACUTE W/ 1V CHEST COMPARISON:  CT abdomen pelvis from yesterday. FINDINGS: Unchanged cardiomegaly. Normal pulmonary vascularity. No focal consolidation, pleural effusion, or pneumothorax. No acute osseous abnormality. Nonaggressive appearing chondroid lesion within the left proximal humeral metadiaphysis,  likely an enchondroma. There is no evidence of dilated bowel loops or free intraperitoneal  air. Large amount of stool throughout the colon. No radiopaque calculi or other significant radiographic abnormality is seen. IMPRESSION: 1. Prominent stool throughout the colon. Correlate for constipation. 2. No acute cardiopulmonary disease. Electronically Signed   By: Titus Dubin M.D.   On: 07/16/2020 10:03   DG Abdomen Acute W/Chest  Result Date: 07/15/2020 CLINICAL DATA:  Abdominal distension. EXAM: DG ABDOMEN ACUTE W/ 1V CHEST COMPARISON:  Chest x-ray 06/11/2020 FINDINGS: The heart is enlarged but stable. Mild central vascular congestion but no edema, infiltrates or effusions. Stable eventration of the right hemidiaphragm. The bony thorax is intact. Large amount of stool and air throughout the colon and down into the rectum suggesting constipation and fecal impaction. No distended small bowel loops to suggest obstruction. No free air. No worrisome calcifications. The bony structures are intact. IMPRESSION: 1. Stable cardiac enlargement and mild central vascular congestion. 2. Large amount of stool and air throughout the colon suggesting constipation and fecal impaction. 3. No findings for small bowel obstruction or free air. Electronically Signed   By: Marijo Sanes M.D.   On: 07/15/2020 11:33    EKG: not done today   Labs on Admission: I have personally reviewed the available labs and imaging studies at the time of the admission.  Pertinent labs:   Glucose 152 BUN 53/Creatinine 4.07/GFR 11; 42/3.61/12 on 8/9; 42/2.99/15 on 7/9 (appears to be baseline) Albumin 2.9 WBC 9.5 Hgb 9.8 UA: 50 glucose; 100 protein; rare bacteria  Assessment/Plan Principal Problem:   AKI (acute kidney injury) ([place]) Active Problems:   CKD (chronic kidney disease) IV   DM (diabetes mellitus) ([place])   Goals of care, counseling/discussion   DNR (do not resuscitate)   (HFpEF) heart failure with preserved ejection fraction - Grade 2 diastolic dysfunction   Vascular dementia with behavior disturbance (HCC)    Obesity, Class III, BMI 40-49.9 (morbid obesity) ([place])    AKI vs. Progressive CKD -Baseline creatinine appears to be about 3 -Creatinine has worsened over the last month, although the GFR is only gradually worsening -UA without evidence of current dehydration -There may an acute component to this, but suspect instead that this is more related to progressive CKD, now stage 5 -Anemia is likely due to renal disease; recent transfusion, appears to be stable -Will admit to Med Surg -Will hydrate with gentle IVF -Recheck BMP in AM -If not improving, nephrology consultation could be considered -However, she does not appear to be a good candidate for HD and her sister is not inclined to proceed with HD at this time -Will request palliative care consultation  Constipation -Will order bowel regimen  -No evidence of sepsis -She currently denies pain -Will hold antibiotics at this time -Continue Senokot, Miralax -Will add tap water enema as well as manual disimpaction  Vascular dementia with behavioral disturbance -Patient has significant difficulty with her short-term memory -Also with hallucinations -Continue Lexapro, Seroquel  Recent comminuted fracture of the distal left tibia and fibula along with fracture of the fourth and fifth metatarsal necks -Status post ORIF left distal tibia, status post ORIF left fibula and lateral malleolus on 7/6. -At SNF for rehab -Will need return to rehab  Chronic diastolic CHF -Appears to be compensated at this time -Prior echo in 04/2020 with preserved EF and grade 2 diastolic dysfunction -Hold diuretics and gently hydrate based on worsening renal function  DM -Recent A1c was 8,1, indicating poor control -hold  Tradjenta -Continue insulin -Cover with sensitive-scale SSI -Given her long-term prognosis, tight glycemic control is not essential  HTN -Patient on anti-HTN meds (hydralazine, Toprol-XL) but also midodrine (see below) -Will hold BP  meds, including Midodrine, and monitor for now -Will cover with prn IV hydralazine  HLD -Continue Lipitor  Adrenal insufficiency -Continue Florinef  Chronic pain -Continue Lyrica, Oxy IR  Morbid obesity -BMI 41.6 -Weight loss should be encouraged -Has apparent OHS for which she uses periodic [place] O2 at home  Goals of care -Patient is DNR -Her sister prefers to focus on comfort measures rather than aggressive measures -At this time, it does not appear that she will be pursuing HD  -Palliative care consultation      Note: This patient has been tested and is negative for the novel coronavirus COVID-19.    DVT prophylaxis:  Lovenox  Code Status: DNR - confirmed with bedside paperwork/family Family Communication: None present; I spoke with the patient's sister by telephone at the time of admission. Disposition Plan:  The patient is from: SNF  Anticipated d/c is to: SNF, possibly with hospice  Anticipated d/c date will depend on clinical response to treatment, likely several days  Patient is currently: acutely ill Consults called: Palliative care; PT/OT/TOC team  Admission status:  Admit - It is my clinical opinion that admission to INPATIENT is reasonable and necessary because of the expectation that this patient will require hospital care that crosses at least 2 midnights to treat this condition based on the medical complexity of the problems presented.  Given the aforementioned information, the predictability of an adverse outcome is felt to be significant.    Karmen Bongo MD [facility]   How to contact the [facility] Attending or Consulting provider [place] or covering provider during after hours [place], for this patient?  1. Check the care team in [facility] and look for a) attending/consulting TR[facility] provider listed and b) the [facility] team listed 2. Log into [email or web address] and use [place] universal password to access. If you do not have the password, please contact the  hospital operator. 3. Locate the [facility] provider you are looking for under [facility] and page to a number that you can be directly reached. 4. If you still have difficulty reaching the provider, please page the [facility] (Director on Call) for the Hospitalists listed on amion for assistance.   07/16/2020, 6:36 PM

## 2020-07-16 NOTE — ED Provider Notes (Signed)
[facility] EMERGENCY DEPARTMENT Provider Note   CSN: [id number] Arrival date & time: 07/16/20  0847     History Chief Complaint  Patient presents with  . Abdominal Pain    Victoria Winters is a 69 y.o. female.  HPI   Pt presents to the ED for abdominal pain.  Pt was having similar issues yesterday.  She was seen in the ED for same.  Pt states the pain was better when she left but she started having pain again last night.  The pain is on the left side of her abdomen.  It does not radiate.  She took a medication for nausea this am.  SHe has not vomited. No diarrhea.  No dysuria.    She was given medications by EMS and her pain is better now.  Past Medical History:  Diagnosis Date  . Brain aneurysm   . Diabetes mellitus without complication ([place])   . Hallucinations    visual and auditory  . Memory loss   . Recurrent UTI   . Renal disorder   . Stage 5 chronic kidney disease ([place])   . Stroke [facility])     Patient Active Problem List   Diagnosis Date Noted  . AKI (acute kidney injury) ([place]) 06/12/2020  . Tibia/fibula fracture, left, closed, initial encounter 06/11/2020  . (HFpEF) heart failure with preserved ejection fraction - Grade 2 diastolic dysfunction 85/01/7740  . Acute on chronic heart failure with preserved ejection fraction (HFpEF) --grade 2 diastolic dysfunction 28/78/6767  . Acute respiratory failure with hypoxia ([place]) 04/23/2020  . Heart failure ([place]) 04/22/2020  . Acute and chronic respiratory failure with hypoxia ([place]) 04/17/2020  . DNR (do not resuscitate) 04/17/2020  . Leukocytosis 04/17/2020  . Anemia in chronic kidney disease (CKD) 04/17/2020  . Closed fracture of distal end of right fibula with routine healing 11/23/2019  . Palliative care by specialist   . Goals of care, counseling/discussion   . History of CVA (cerebrovascular accident)   . Acute encephalopathy 07/21/2019  . Fall 07/20/2019  . CKD (chronic kidney disease) IV 07/20/2019  .  Brain aneurysm 07/20/2019  . DM (diabetes mellitus) ([place]) 07/20/2019  . Bilateral fibular fractures 07/20/2019  . CVA (cerebral vascular accident) ([place]) 07/20/2019  . Hallucination 07/20/2019    Past Surgical History:  Procedure Laterality Date  . CHOLECYSTECTOMY    . SKIN GRAFT    . TIBIA IM NAIL INSERTION Left 06/11/2020   Procedure: OPEN REDUCTION INTERNAL FIXATION LEFT TIBIA;  Surgeon: Wylene Simmer, MD;  Location: [facility];  Service: Orthopedics;  Laterality: Left;     OB History    Gravida  2   Para  2   Term  2   Preterm      AB      Living        SAB      TAB      Ectopic      Multiple      Live Births              Family History  Problem Relation Age of Onset  . Heart disease Mother   . Diabetes Mellitus II Mother   . Heart disease Father   . Diabetes Father   . Heart disease Brother   . Cancer Other     Social History   Tobacco Use  . Smoking status: Former Research scientist (life sciences)  . Smokeless tobacco: Never Used  Vaping Use  . Vaping Use: Never used  Substance  Use Topics  . Alcohol use: Yes    Comment: rarely  . Drug use: Never    Home Medications Prior to Admission medications   Medication Sig Start Date End Date Taking? Authorizing Provider  aspirin EC 81 MG tablet Take 1 tablet (81 mg total) by mouth 2 (two) times daily. 06/12/20   Corky Sing, PA-C  atorvastatin (LIPITOR) 20 MG tablet Take 20 mg by mouth at bedtime.  09/29/19   [provider]  cetirizine (ZYRTEC) 10 MG tablet Take 10 mg by mouth at bedtime.     [provider]  docusate sodium (COLACE) 100 MG capsule Take 1 capsule (100 mg total) by mouth 2 (two) times daily. While taking narcotic pain medicine. Patient not taking: Reported on 07/15/2020 06/12/20   Corky Sing, PA-C  escitalopram (LEXAPRO) 10 MG tablet Take 10 mg by mouth every morning.    [provider]  fludrocortisone (FLORINEF) 0.1 MG tablet Take 2 tablets by mouth daily. 03/19/20   [provider]  hydrALAZINE (APRESOLINE) 25 MG tablet Take 1 tablet (25 mg total) by mouth 3 (three) times daily. 04/25/20   Roxan Hockey, MD  insulin glargine (LANTUS) 100 UNIT/ML injection Inject 0.1 mLs (10 Units total) into the skin at bedtime. 07/29/19   Kathie Dike, MD  insulin lispro (HUMALOG) 100 UNIT/ML injection Inject 4-12 Units into the skin 3 (three) times daily before meals. Sliding scale starting at; 180-220= 4 units 221-260= 6 units 261-300= 8 units 301-340=10units 341-380=12units     [provider]  linagliptin (TRADJENTA) 5 MG TABS tablet Take 5 mg by mouth every morning.    [provider]  melatonin 5 MG TABS Take 5 mg by mouth at bedtime.     [provider]  metoprolol succinate (TOPROL-XL) 100 MG 24 hr tablet Take 100 mg by mouth every morning.     [provider]  midodrine (PROAMATINE) 10 MG tablet Take 1 tablet (10 mg total) by mouth 3 (three) times daily with meals. 04/25/20   Roxan Hockey, MD  Multiple Vitamin (MULTIVITAMIN WITH MINERALS) TABS tablet Take 1 tablet by mouth daily.    [provider]  oxyCODONE (OXY IR/ROXICODONE) 5 MG immediate release tablet Take 5 mg by mouth every 4 (four) hours as needed for severe pain.    [provider]  potassium chloride SA (KLOR-CON) 20 MEQ tablet Take 2 tablets (40 mEq total) by mouth daily. 06/07/20   Arnoldo Lenis, MD  pregabalin (LYRICA) 75 MG capsule Take 1 capsule (75 mg total) by mouth 2 (two) times daily. 06/14/20 07/15/20  Barb Merino, MD  QUEtiapine (SEROQUEL) 25 MG tablet Take 1 tablet (25 mg total) by mouth at bedtime. 07/29/19   Kathie Dike, MD  senna (SENOKOT) 8.6 MG TABS tablet Take 2 tablets (17.2 mg total) by mouth 2 (two) times daily. 06/12/20   Corky Sing, PA-C  torsemide (DEMADEX) 20 MG tablet Take 3 tablets (60 mg total) by mouth 2 (two) times daily. 06/07/20 09/05/20  Arnoldo Lenis, MD    Allergies    Penicillins, Chicken  allergy, Contrast media [iodinated diagnostic agents], Eggs or egg-derived products, Milk-related compounds, Nickel, Oatmeal, Other, Vaccinium angustifolium, and Wheat bran  Review of Systems   Review of Systems  Gastrointestinal: Positive for constipation.  All other systems reviewed and are negative.   Physical Exam Updated Vital Signs BP (!) 113/47   Pulse 70   Temp 99.9 F (37.7 C) (Oral)  Resp 12   SpO2 95%   Physical Exam Vitals and nursing note reviewed.  Constitutional:      Appearance: She is not toxic-appearing or diaphoretic.  HENT:     Head: Normocephalic and atraumatic.     Right Ear: External ear normal.     Left Ear: External ear normal.  Eyes:     General: No scleral icterus.       Right eye: No discharge.        Left eye: No discharge.     Conjunctiva/sclera: Conjunctivae normal.  Neck:     Trachea: No tracheal deviation.  Cardiovascular:     Rate and Rhythm: Normal rate and regular rhythm.  Pulmonary:     Effort: Pulmonary effort is normal. No respiratory distress.     Breath sounds: Normal breath sounds. No stridor. No wheezing or rales.  Abdominal:     General: Bowel sounds are normal. There is distension.     Palpations: There is no mass.     Tenderness: There is generalized abdominal tenderness. There is no guarding or rebound.  Genitourinary:    Rectum: Tenderness present. No mass.     Comments: Note:rectal exam performed after pt went to xray, no fecal impaction, no stool in the rectal vault, small amount brown stool, flatus passed on exam Musculoskeletal:     Cervical back: Neck supple.     Comments: Cast LLE, old bruise noted right foot  Skin:    General: Skin is warm and dry.     Findings: No rash.  Neurological:     Mental Status: She is alert.     GCS: GCS eye subscore is 4. GCS verbal subscore is 5. GCS motor subscore is 6.     Cranial Nerves: No cranial nerve deficit (no facial droop, extraocular movements intact, speech slow but no  dysarthria).     Sensory: No sensory deficit.     Motor: No abnormal muscle tone or seizure activity.     ED Results / Procedures / Treatments   Labs (all labs ordered are listed, but only abnormal results are displayed) Labs Reviewed  COMPREHENSIVE METABOLIC PANEL - Abnormal; Notable for the following components:      Result Value   Glucose, Bld 152 (*)    BUN 53 (*)    Creatinine, Ser 4.07 (*)    Calcium 8.4 (*)    Total Protein 6.2 (*)    Albumin 2.9 (*)    GFR calc non Af Amer 11 (*)    GFR calc Af Amer 12 (*)    All other components within normal limits  CBC WITH DIFFERENTIAL/PLATELET - Abnormal; Notable for the following components:   RBC 3.65 (*)    Hemoglobin 9.8 (*)    HCT 32.1 (*)    Monocytes Absolute 1.4 (*)    Basophils Absolute 1.0 (*)    All other components within normal limits  SARS CORONAVIRUS 2 BY RT PCR (HOSPITAL ORDER, [facility])  LIPASE, BLOOD  URINALYSIS, ROUTINE W REFLEX MICROSCOPIC    EKG None  Radiology CT ABDOMEN PELVIS WO CONTRAST  Result Date: 07/15/2020 CLINICAL DATA:  Abdominal distension. EXAM: CT ABDOMEN AND PELVIS WITHOUT CONTRAST TECHNIQUE: Multidetector CT imaging of the abdomen and pelvis was performed following the standard protocol without IV contrast. COMPARISON:  CT lumbar spine 09/05/2019 FINDINGS: Lower chest: Minimal linear scarring/atelectasis left base. Mild cardiomegaly. Calcified plaque over the left anterior descending and lateral circumflex coronary arteries. Calcified plaque  over the thoracic aorta. Possible 1.5 cm right infrahilar lymph node. Hepatobiliary: Previous cholecystectomy. Liver and biliary tree are normal. Pancreas: Normal. Spleen: Normal. Adrenals/Urinary Tract: Adrenal glands are normal. Kidneys are normal size without hydronephrosis or nephrolithiasis. Ureters are normal. Bladder is mildly distended. Stomach/Bowel: Stomach and small bowel are normal. Appendix is normal. Moderate air and  stool throughout the colon with moderate fecal retention over the sigmoid colon. There is a redundant sigmoid colon. Minimal wall thickening of the rectosigmoid colon which could be seen with mild stercoral colitis. Minimal diverticulosis of the colon. Vascular/Lymphatic: Mild calcified plaque over the abdominal aorta which is normal in caliber. Few small nonspecific periaortic lymph nodes. Reproductive: Normal. Uterus and ovaries displaced anteriorly and superiorly by the air and stool filled rectosigmoid colon Other: No free peritoneal fluid or free peritoneal air. No focal inflammatory change. Tiny umbilical hernia containing only peritoneal fat. Musculoskeletal: Degenerative changes of the spine. Mild stable compression fracture of L2. IMPRESSION: 1. Minimal wall thickening of the rectosigmoid colon with associated moderate fecal retention. Findings may be due to mild stercoral colitis. Diverticulosis of the colon without evidence of diverticulitis. 2. Aortic Atherosclerosis (ICD10-I70.0). Atherosclerotic coronary artery disease. Mild cardiomegaly. 3.  Small umbilical hernia containing only peritoneal fat. 4.  Stable moderate L2 compression fracture. Electronically Signed   By: Marin Olp M.D.   On: 07/15/2020 14:30   CT Head Wo Contrast  Result Date: 07/15/2020 CLINICAL DATA:  Syncopal episode EXAM: CT HEAD WITHOUT CONTRAST TECHNIQUE: Contiguous axial images were obtained from the base of the skull through the vertex without intravenous contrast. COMPARISON:  06/11/2020 FINDINGS: Brain: There is no acute intracranial hemorrhage, mass effect, or edema. Gray-white differentiation is preserved. There is no extra-axial fluid collection. Ventricles and sulci are stable in size and configuration. Chronic infarcts of the basal ganglia and adjacent white matter bilaterally. Additional patchy hypoattenuation in the supratentorial white matter is nonspecific but may reflect mild chronic microvascular ischemic  changes similar to the prior study. Vascular: There is atherosclerotic calcification at the skull base. Skull: Calvarium is unremarkable. Sinuses/Orbits: No acute finding. Other: None. IMPRESSION: No acute intracranial abnormality. Stable chronic findings detailed above. Electronically Signed   By: Macy Mis M.D.   On: 07/15/2020 11:53   DG Abd Acute W/Chest  Result Date: 07/16/2020 CLINICAL DATA:  Abdominal distension. EXAM: DG ABDOMEN ACUTE W/ 1V CHEST COMPARISON:  CT abdomen pelvis from yesterday. FINDINGS: Unchanged cardiomegaly. Normal pulmonary vascularity. No focal consolidation, pleural effusion, or pneumothorax. No acute osseous abnormality. 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 There is no evidence of dilated bowel loops or free intraperitoneal air. Large amount of stool throughout the colon. No radiopaque calculi or other significant radiographic abnormality is seen. IMPRESSION: 1. Prominent stool throughout the colon. Correlate for constipation. 2. No acute cardiopulmonary disease. Electronically Signed   By: Titus Dubin M.D.   On: 07/16/2020 10:03   DG Abdomen Acute W/Chest  Result Date: 07/15/2020 CLINICAL DATA:  Abdominal distension. EXAM: DG ABDOMEN ACUTE W/ 1V CHEST COMPARISON:  Chest x-ray 06/11/2020 FINDINGS: The heart is enlarged but stable. Mild central vascular congestion but no edema, infiltrates or effusions. Stable eventration of the right hemidiaphragm. The bony thorax is intact. Large amount of stool and air throughout the colon and down into the rectum suggesting constipation and fecal impaction. No distended small bowel loops to suggest obstruction. No free air. No worrisome calcifications. The bony structures are intact. IMPRESSION: 1. Stable cardiac enlargement  and mild central vascular congestion. 2. Large amount of stool and air throughout the colon suggesting constipation and fecal impaction.  3. No findings for small bowel obstruction or free air. Electronically Signed   By: Marijo Sanes M.D.   On: 07/15/2020 11:33    Procedures Procedures (including critical care time)  Medications Ordered in ED Medications  sodium chloride 0.9 % bolus 500 mL (0 mLs Intravenous Stopped 07/16/20 1214)    And  0.9 %  sodium chloride infusion ( Intravenous New Bag/Given 07/16/20 1017)  metroNIDAZOLE (FLAGYL) tablet 500 mg (has no administration in time range)    ED Course  I have reviewed the triage vital signs and the nursing notes.  Pertinent labs & imaging results that were available during my care of the patient were reviewed by me and considered in my medical decision making (see chart for details).  Clinical Course as of Jul 16 1421  Tue Jul 16, 2020  0920 CT scan yesterday showed fecal retention and possible mild stercoral colitis   [JK]  1006 Notified pt had a large BM in radiology suite   [JK]  1301 Labs show normal white blood cell count.  Electrolyte panel does show worsening renal insufficiency   [JK]  1302 Abdominal series does show prominent stool throughout the colon   [JK]    Clinical Course User Index [JK] Dorie Rank, MD   MDM Rules/Calculators/A&P                          Patient presented to ED for evaluation of increasing abdominal pain.  Patient was in the ED yesterday for the same.  She had a CT scan that showed evidence of constipation and questionable colitis.  Patient's laboratory tests in the ED are notable for worsening renal insufficiency.  Her x-rays do not show evidence of obstruction but do show persistent prominent stool throughout the colon.  She does not have a fecal impaction on my exam.  She does not have an elevated white blood cell count but with her pain is concerning for possible colitis suggested by the CT .   Will start pt on abx.  IV fluids have been ordered.  UA is pending.  Will consult for admission.  Final Clinical Impression(s) / ED  Diagnoses Final diagnoses:  AKI (acute kidney injury) ([place])  Colitis      Dorie Rank, MD 07/16/20 1422

## 2020-07-16 NOTE — ED Notes (Signed)
Pt remains at xray

## 2020-07-16 NOTE — ED Notes (Signed)
Pt returned from xray

## 2020-07-16 NOTE — ED Notes (Signed)
Placed pt on a purewick. Pt is resting at this time.

## 2020-07-17 DIAGNOSIS — I5032 Chronic diastolic (congestive) heart failure: Secondary | ICD-10-CM | POA: Diagnosis not present

## 2020-07-17 DIAGNOSIS — K529 Noninfective gastroenteritis and colitis, unspecified: Secondary | ICD-10-CM

## 2020-07-17 DIAGNOSIS — N184 Chronic kidney disease, stage 4 (severe): Secondary | ICD-10-CM

## 2020-07-17 DIAGNOSIS — Z7189 Other specified counseling: Secondary | ICD-10-CM

## 2020-07-17 DIAGNOSIS — Z515 Encounter for palliative care: Secondary | ICD-10-CM

## 2020-07-17 DIAGNOSIS — F0151 Vascular dementia with behavioral disturbance: Secondary | ICD-10-CM

## 2020-07-17 DIAGNOSIS — Z66 Do not resuscitate: Secondary | ICD-10-CM

## 2020-07-17 DIAGNOSIS — N179 Acute kidney failure, unspecified: Secondary | ICD-10-CM | POA: Diagnosis not present

## 2020-07-17 DIAGNOSIS — E1165 Type 2 diabetes mellitus with hyperglycemia: Secondary | ICD-10-CM | POA: Diagnosis not present

## 2020-07-17 LAB — CBC
HCT: 27.2 % — ABNORMAL LOW (ref 36.0–46.0)
Hemoglobin: 8.5 g/dL — ABNORMAL LOW (ref 12.0–15.0)
MCH: 27.1 pg (ref 26.0–34.0)
MCHC: 31.3 g/dL (ref 30.0–36.0)
MCV: 86.6 fL (ref 80.0–100.0)
Platelets: 152 10*3/uL (ref 150–400)
RBC: 3.14 MIL/uL — ABNORMAL LOW (ref 3.87–5.11)
RDW: 15.6 % — ABNORMAL HIGH (ref 11.5–15.5)
WBC: 7.5 10*3/uL (ref 4.0–10.5)
nRBC: 0 % (ref 0.0–0.2)

## 2020-07-17 LAB — BASIC METABOLIC PANEL
Anion gap: 13 (ref 5–15)
BUN: 51 mg/dL — ABNORMAL HIGH (ref 8–23)
CO2: 30 mmol/L (ref 22–32)
Calcium: 7.8 mg/dL — ABNORMAL LOW (ref 8.9–10.3)
Chloride: 101 mmol/L (ref 98–111)
Creatinine, Ser: 3.55 mg/dL — ABNORMAL HIGH (ref 0.44–1.00)
GFR calc Af Amer: 14 mL/min — ABNORMAL LOW (ref >60)
GFR calc non Af Amer: 12 mL/min — ABNORMAL LOW (ref >60)
Glucose, Bld: 142 mg/dL — ABNORMAL HIGH (ref 70–99)
Potassium: 3.6 mmol/L (ref 3.5–5.1)
Sodium: 144 mmol/L (ref 135–145)

## 2020-07-17 LAB — GLUCOSE, CAPILLARY
Glucose-Capillary: 138 mg/dL — ABNORMAL HIGH (ref 70–99)
Glucose-Capillary: 183 mg/dL — ABNORMAL HIGH (ref 70–99)
Glucose-Capillary: 201 mg/dL — ABNORMAL HIGH (ref 70–99)
Glucose-Capillary: 168 mg/dL — ABNORMAL HIGH (ref 70–99)

## 2020-07-17 NOTE — Plan of Care (Signed)
  Problem: Education: Goal: Knowledge of General Education information will improve Description Including pain rating scale, medication(s)/side effects and non-pharmacologic comfort measures Outcome: Progressing   

## 2020-07-17 NOTE — Consult Note (Signed)
Consultation Note Date: 07/17/2020   Patient Name: Victoria Winters  DOB: 08/23/1951  MRN: [id number]  Age / Sex: 69 y.o., female  PCP: Zhou-Talbert, Elwyn Lade, MD Referring Physician: Kerney Elbe, DO  Reason for Consultation: Establishing goals of care  HPI/Patient Profile: 69 y.o. female past medical history of CVA, DM, brain aneurysm, hallucinations, frequent UTI, CKD 4-5, recent fall with comminuted tibia and fibula fracture status post ORIF on 7/6 who presented from [facility] where she has been for rehab after fracture with syncope and increased abdominal pain.  She was also found to have worsened kidney function with creatinine elevated from baseline.  CT showed fecal retention as well as Winters stercoral colitis.  She was found to be hypoxic.  She returned to skilled facility but returned again with continued complaints of abdominal distention and altered mental status.  There was concern about worsening of her kidney function and she is not a candidate for dialysis.  Palliative medicine consultation for goals of care.   Clinical Assessment and Goals of Care:  I met today with Victoria Winters and her sister, Victoria Winters.  I introduced palliative care as specialized medical care for people living with serious illness. It focuses on providing relief from the symptoms and stress of a serious illness. The goal is to improve quality of life for both the patient and the family.  Values and goals of care important to patient and family were attempted to be elicited.  We discussed clinical course as well as wishes moving forward in regard to advanced directives.  Concepts specific to code status and care plan this hospitalization discussed.  We discussed difference between a aggressive medical intervention path and a palliative, comfort focused care path.  We reviewed her clinical course of the past several  months with continued decline in her functional status following proximal fibula fracture.  She is currently been living at [facility] for rehab and her sister reports that they are working to secure long-term care for her through [place] support.  I talked with Ms. Balon about her kidney function and the fact that it appears to be improving this admission with continued supportive care.  We talked about progressive nature of her underlying comorbid conditions and working to continue to allow her to feel as well as Winters for as long as Winters.  She is not really able to follow this conversation, and she defers to her sister to help with any questions and to approve plan moving forward.  I then met with her sister, Victoria Winters, outside of her room to review prior documentation that was completed last admission in regard to care plan last admission.  Victoria Winters reports that Victoria Winters gets upset and concerned when talking about certain topics and prefers to discuss without her being present as she does not follow the conversation but gets upset by it.  We reviewed MOST form which is still up-to-date with current wishes.  Questions and concerns addressed.     I provided my contact information to  Victoria Winters to call with any questions.  SUMMARY OF RECOMMENDATIONS    Goals of care discussed with patient as well as her sister/HC POA, Victoria Winters.  Reviewed copies of [facility] POA paperwork that was in chart.  We also reviewed copy of MOST form in the chart and Victoria Winters indicates that this is up-to-date.  Most indicates care preferences of DNR/DNI, limited interventions, IV/antibiotics if indicated, and short-term trial of feeding tube.  Victoria Winters is clear that her goal moving forward for Victoria Winters while avoiding interventions that may cause her to suffer when the time comes that she does approach end-of-life.  She seems to have good insight into the  fact that she has progressive renal disease that will continue to worsen.  She also understands that she would be a poor candidate for hemodialysis.  She would like to continue to "treat the treatable" while avoiding overly aggressive interventions.  At this point, their main concern is getting her transition back to [facility] for continuation of rehab with long-term goal of securing long-term care bed with Medicaid.  I also recommend palliative care to follow her as an outpatient at skilled facility.  Please include this on the discharge summary if you agree it is appropriate.   Code Status/Advance Care Planning:  DNR  Symptom Management:   Constipation: Recommend continue bowel regimen of Senokot and MiraLAX on discharge.  Palliative Prophylaxis:   Aspiration, Bowel Regimen, Delirium Protocol, Frequent Pain Assessment, Oral Care and Turn Reposition  Psycho-social/Spiritual:   Desire for further Chaplaincy support:yes  Additional Recommendations: Caregiving  Support/Resources, Compassionate Wean Education and Education on Hospice  Prognosis:   Unable to determine: guarded with her continued renal impairment  Discharge Planning: [facility] for rehab with Palliative care service follow-up      Primary Diagnoses: Present on Admission: . AKI (acute kidney injury) ([place]) . (HFpEF) heart failure with preserved ejection fraction - Grade 2 diastolic dysfunction . CKD (chronic kidney disease) IV . DNR (do not resuscitate) . Vascular dementia with behavior disturbance ([place]) . Obesity, Class III, BMI 40-49.9 (morbid obesity) ([place])   I have reviewed the medical record, interviewed the patient and family, and examined the patient. The following aspects are pertinent.  Past Medical History:  Diagnosis Date  . Brain aneurysm   . Diabetes mellitus without complication ([place])   . Hallucinations    visual and auditory  . Memory loss   . Recurrent UTI   .  Renal disorder   . Stage 5 chronic kidney disease ([place])   . Stroke [facility])    Social History   Socioeconomic History  . Marital status: Single    Spouse name: Not on file  . Number of children: Not on file  . Years of education: Not on file  . Highest education level: Not on file  Occupational History  . Not on file  Tobacco Use  . Smoking status: Former Research scientist (life sciences)  . Smokeless tobacco: Never Used  Vaping Use  . Vaping Use: Never used  Substance and Sexual Activity  . Alcohol use: Yes    Comment: rarely  . Drug use: Never  . Sexual activity: Not on file  Other Topics Concern  . Not on file  Social History Narrative  . Not on file   Social Determinants of Health   Financial Resource Strain:   . Difficulty of Paying Living Expenses:   Food Insecurity:   .  Worried About Charity fundraiser in the Last Year:   . Arboriculturist in the Last Year:   Transportation Needs:   . Film/video editor (Medical):   Marland Kitchen Lack of Transportation (Non-Medical):   Physical Activity:   . Days of Exercise per Week:   . Minutes of Exercise per Session:   Stress:   . Feeling of Stress :   Social Connections:   . Frequency of Communication with Friends and Family:   . Frequency of Social Gatherings with Friends and Family:   . Attends Religious Services:   . Active Member of Clubs or Organizations:   . Attends Archivist Meetings:   Marland Kitchen Marital Status:    Family History  Problem Relation Age of Onset  . Heart disease Mother   . Diabetes Mellitus II Mother   . Heart disease Father   . Diabetes Father   . Heart disease Brother   . Cancer Other    Scheduled Meds: . aspirin EC  81 mg Oral Daily  . atorvastatin  20 mg Oral QHS  . enoxaparin (LOVENOX) injection  30 mg Subcutaneous Q24H  . escitalopram  10 mg Oral q morning - 10a  . fludrocortisone  0.2 mg Oral Daily  . insulin aspart  0-9 Units Subcutaneous TID WC  . insulin glargine  10 Units Subcutaneous QHS  . melatonin  5  mg Oral QHS  . multivitamin with minerals  1 tablet Oral Daily  . polyethylene glycol  17 g Oral Daily  . pregabalin  75 mg Oral BID  . QUEtiapine  25 mg Oral QHS  . senna  2 tablet Oral BID   Continuous Infusions: . lactated ringers 75 mL/hr at 07/17/20 1237   PRN Meds:.acetaminophen **OR** acetaminophen, calcium carbonate (dosed in mg elemental calcium), camphor-menthol **AND** hydrOXYzine, docusate sodium, feeding supplement (NEPRO CARB STEADY), hydrALAZINE, morphine injection, ondansetron **OR** ondansetron (ZOFRAN) IV, oxyCODONE, sorbitol, zolpidem Medications Prior to Admission:  Prior to Admission medications   Medication Sig Start Date End Date Taking? Authorizing Provider  acetaminophen-codeine (TYLENOL #4) 300-60 MG tablet Take 1 tablet by mouth every 4 (four) hours as needed for moderate pain.   Yes [provider]  aspirin EC 81 MG tablet Take 81 mg by mouth every morning.   Yes [provider]  atorvastatin (LIPITOR) 40 MG tablet Take 40 mg by mouth at bedtime.   Yes [provider]  cloNIDine (CATAPRES - DOSED IN MG/24 HR) 0.2 mg/24hr patch Place 0.2 mg onto the skin every Sunday.   Yes [provider]  clopidogrel (PLAVIX) 75 MG tablet Take 75 mg by mouth every morning.   Yes [provider]  Cranberry-Vitamin C-Vitamin E (CRANBERRY PLUS VITAMIN C) 4200-20-3 MG-MG-UNIT CAPS Take 1 capsule by mouth daily as needed (urinary tract health).   Yes [provider]  doxycycline (VIBRA-TABS) 100 MG tablet Take 100 mg by mouth every morning.   Yes [provider]  escitalopram (LEXAPRO) 10 MG tablet Take 10 mg by mouth every morning.   Yes [provider]  furosemide (LASIX) 40 MG tablet Take 40 mg by mouth every morning.   Yes [provider]  hydrALAZINE (APRESOLINE) 25 MG tablet Take 25 mg by mouth 3 (three) times daily.   Yes [provider]  insulin glargine (LANTUS) 100 UNIT/ML injection  Inject 30 Units into the skin at bedtime.   Yes [provider]  insulin lispro (HUMALOG) 100 UNIT/ML injection Inject 1-17  Units into the skin 3 (three) times daily before meals. Sliding scale starting at; 180-220= 4 units 221-260= 6 units 261-300= 8 units 301-340=10units 341-380=12units   Yes [provider]  isosorbide mononitrate (IMDUR) 30 MG 24 hr tablet Take 30 mg by mouth every morning.   Yes [provider]  linagliptin (TRADJENTA) 5 MG TABS tablet Take 5 mg by mouth every morning.   Yes [provider]  loratadine (CLARITIN) 10 MG tablet Take 10 mg by mouth every morning.   Yes [provider]  Melatonin 1 MG TABS Take 1 mg by mouth at bedtime.   Yes [provider]  metoprolol (TOPROL-XL) 200 MG 24 hr tablet Take 200 mg by mouth every morning.   Yes [provider]  Multiple Vitamin (MULTIVITAMIN WITH MINERALS) TABS tablet Take 1 tablet by mouth daily.   Yes [provider]  omeprazole (PRILOSEC) 20 MG capsule Take 20 mg by mouth every morning.   Yes [provider]  pregabalin (LYRICA) 75 MG capsule Take 75 mg by mouth 2 (two) times daily.   Yes [provider]   Allergies  Allergen Reactions  . Penicillins Anaphylaxis    Did it involve swelling of the face/tongue/throat, SOB, or low BP? Yes Did it involve sudden or severe rash/hives, skin peeling, or any reaction on the inside of your mouth or nose? Unknown Did you need to seek medical attention at a hospital or doctor's office? Unknown When did it last happen?Within past 10 years If all above answers are "NO", may proceed with cephalosporin use.   . Chicken Allergy Other (See Comments)    unk on mar  . Contrast Media [Iodinated Diagnostic Agents] Other (See Comments)    unk on mar  . Eggs Or Egg-Derived Products Swelling    Patient is allergic to egg yolk, but can eat egg white with no problems  . Milk-Related Compounds Other (See  Comments)    unk on mar  . Nickel Other (See Comments)    unk on mar  . Oatmeal Other (See Comments)    unk on mar  . Other     Black beans, blueberry, cats, dogs, grass clippings  . Vaccinium Angustifolium Hives  . Wheat Bran Other (See Comments)    unk on mar   Review of Systems  Currently denies complaints.  She is an unreliable historian.  Physical Exam Vitals and nursing note reviewed.  HENT:     Head: Normocephalic and atraumatic.  Pulmonary:     Effort: No tachypnea, accessory muscle usage or respiratory distress.  Skin:    General: Skin is warm and dry.  Neurological:     Mental Status: She is easily aroused.     Comments: Wakes to voice. Oriented to self. Cognitive delay and paranoia.   Psychiatric:        Mood and Affect: Affect is flat.        Speech: Speech is delayed.    Vital Signs: BP (!) 159/60 (BP Location: Left Arm)   Pulse 77   Temp 98.7 F (37.1 C) (Oral)   Resp 18   Ht _0  (1.549 m)   Wt 95.3 kg   SpO2 90%   BMI 39.68 kg/m  Pain Scale: 0-10 POSS *See Group Information*: S-Acceptable,Sleep, easy to arouse Pain Score: 0-No pain   SpO2: SpO2: 90 % O2 Device:SpO2: 90 % O2 Flow Rate: .   IO: Intake/output summary:   Intake/Output Summary (Last 24 hours) at 07/17/2020  [place] filed at 07/17/2020 1657 Gross per 24 hour  Intake 678.75 ml  Output 750 ml  Net -71.25 ml    LBM: Last BM Date: 07/17/20 Baseline Weight: Weight: 95.3 kg Most recent weight: Weight: 95.3 kg     Palliative Assessment/Data: PPS 40%   Flowsheet Rows     Most Recent Value  Intake Tab  Referral Department Hospitalist  Unit at Time of Referral Med/Surg Unit  Palliative Care Primary Diagnosis Nephrology  Date Notified 07/17/20  Palliative Care Type Return patient Palliative Care  Reason for referral Clarify Goals of Care  Date of Admission 07/16/20  Date first seen by Palliative Care 07/17/20  # of days Palliative referral response time 0 Day(s)  # of  days IP prior to Palliative referral 1  Clinical Assessment  Palliative Performance Scale Score 50%  Psychosocial & Spiritual Assessment  Palliative Care Outcomes  Patient/Family meeting held? Yes  Who was at the meeting? Patient, sister/HCPOA      Time In: 1500 Time Out: 1620 Time Total: 80 Greater than 50%  of this time was spent counseling and coordinating care related to the above assessment and plan.  Signed by:  Micheline Rough, MD [facility] Team [phone]  Please contact Palliative Medicine Team phone at [phone] for questions and concerns.  For individual provider: See Shea Evans

## 2020-07-17 NOTE — Evaluation (Signed)
Occupational Therapy Evaluation Patient Details Name: Victoria Winters MRN: [id number] DOB: 11-12-1951 Today's Date: 07/17/2020    History of Present Illness Pt is a 69 y.o. F with significant PMH of CVA, stage IV CKD, dementia, CHF, DM, recent admission 7/6-9 with an comminuted fracutre of distal L tibia and fibula fracture s/p ORIF, who presents with AKI vs progressive CKD and constipation.   Clinical Impression   Patient presenting for OT evaluation 2/2 dx. above and deficits listed below. Patient currently requiring Mod A to Total A for self-care tasks and +2 assist for functional transfers with maximal cues and use of RW for adherence to LLE weightbearing precautions. Patient would benefit from continued acute OT services in prep for safe d/c to next level of care with recommendation for return to SNF rehab.    Follow Up Recommendations  SNF    Equipment Recommendations  Other (comment) (Defer to next level of care)    Recommendations for Other Services       Precautions / Restrictions Precautions Precautions: Fall Restrictions Weight Bearing Restrictions: Yes LLE Weight Bearing: Non weight bearing      Mobility Bed Mobility Overal bed mobility: Needs Assistance Bed Mobility: Supine to Sit;Rolling     Supine to sit: Mod assist     General bed mobility comments: Min guard to roll for peri care. ModA for transition to long sit and use of bed rails, minA for LE negotiation to edge of bed  Transfers Overall transfer level: Needs assistance Equipment used: Rolling walker (2 wheeled) Transfers: Sit to/from [facility] Sit to Stand: Min assist;+2 physical assistance Stand pivot transfers: Mod assist;+2 physical assistance       General transfer comment: MinA + 2 to rise to stand from edge of bed, OT foot underneath L foot to block weightbearing. ModA + 2 for pivot towards right, cues for sequencing/direction and weighbearing precautions    Balance Overall  balance assessment: Needs assistance Sitting-balance support: Feet unsupported Sitting balance-Leahy Scale: Good     Standing balance support: Bilateral upper extremity supported Standing balance-Leahy Scale: Poor                             ADL either performed or assessed with clinical judgement   ADL Overall ADL's : Needs assistance/impaired     Grooming: Set up;Sitting           Upper Body Dressing : Moderate assistance;Sitting Upper Body Dressing Details (indicate cue type and reason): To don anterior hospital gown Lower Body Dressing: Maximal assistance Lower Body Dressing Details (indicate cue type and reason): To don footwear in supine Toilet Transfer: Minimal assistance;+2 for physical assistance;+2 for safety/equipment Toilet Transfer Details (indicate cue type and reason): Simulated with stand-pivot to recliner with max cues for sequencing/safety and use of RW Toileting- Clothing Manipulation and Hygiene: Total assistance Toileting - Clothing Manipulation Details (indicate cue type and reason): Patient incontinent of bowel upon entry requiring Total A for hygiene/clothing management in supine.              Vision   Vision Assessment?: No apparent visual deficits     Perception     Praxis      Pertinent Vitals/Pain Pain Assessment: Faces Faces Pain Scale: Hurts a little bit Pain Location: headache Pain Descriptors / Indicators: Headache Pain Intervention(s): Monitored during session     Hand Dominance Left   Extremity/Trunk Assessment Upper Extremity Assessment Upper Extremity Assessment: Generalized weakness  Lower Extremity Assessment Lower Extremity Assessment: RLE deficits/detail;LLE deficits/detail RLE Deficits / Details: At least 3/5 strength LLE Deficits / Details: Recent tib/fib fx s/p ORIF. Able to wiggle toes minimally       Communication Communication Communication: No difficulties   Cognition Arousal/Alertness:  Awake/alert Behavior During Therapy: Flat affect Overall Cognitive Status: History of cognitive impairments - at baseline                                 General Comments: History of dementia and STM deficits. Pt not oriented to place, oriented to year, but not month. Following 1 step commands, cues for safety   General Comments       Exercises Exercises: General Lower Extremity General Exercises - Lower Extremity Ankle Circles/Pumps: Right;10 reps;Supine   Shoulder Instructions      Home Living Family/patient expects to be discharged to:: Skilled nursing facility                                        Prior Functioning/Environment Level of Independence: Needs assistance  Gait / Transfers Assistance Needed: Unsure; pt unable to provide ADL's / Homemaking Assistance Needed: Likely requiring assist for ADL's at SNF            OT Problem List: Decreased strength;Decreased activity tolerance;Impaired balance (sitting and/or standing);Decreased cognition;Decreased safety awareness;Decreased knowledge of precautions;Pain      OT Treatment/Interventions: Self-care/ADL training;Therapeutic exercise;Energy conservation;DME and/or AE instruction;Therapeutic activities;Patient/family education;Balance training    OT Goals(Current goals can be found in the care plan section) Acute Rehab OT Goals Patient Stated Goal: did not state OT Goal Formulation: Patient unable to participate in goal setting Time For Goal Achievement: 07/31/20 ADL Goals Pt Will Perform Lower Body Bathing: sitting/lateral leans;sit to/from stand;with min assist Pt Will Perform Lower Body Dressing: sitting/lateral leans;sit to/from stand;with min assist Pt Will Transfer to Toilet: with min assist;stand pivot transfer;bedside commode Pt Will Perform Toileting - Clothing Manipulation and hygiene: with min assist;sitting/lateral leans;sit to/from stand Additional ADL Goal #1: Patient will  adhere to RLE NWB preacutions during BADLs with less than 3 verbal/tactile cues.  OT Frequency: Min 2X/week   Barriers to D/C:            Co-evaluation PT/OT/SLP Co-Evaluation/Treatment: Yes Reason for Co-Treatment: Necessary to address cognition/behavior during functional activity;For patient/therapist safety;To address functional/ADL transfers PT goals addressed during session: Mobility/safety with mobility OT goals addressed during session: ADL's and self-care      AM-PAC OT "6 Clicks" Daily Activity     Outcome Measure Help from another person eating meals?: A Little Help from another person taking care of personal grooming?: A Little Help from another person toileting, which includes using toliet, bedpan, or urinal?: Total Help from another person bathing (including washing, rinsing, drying)?: A Lot Help from another person to put on and taking off regular upper body clothing?: A Little Help from another person to put on and taking off regular lower body clothing?: A Lot 6 Click Score: 14   End of Session Equipment Utilized During Treatment: Gait belt;Rolling walker Nurse Communication: Mobility status  Activity Tolerance: Patient tolerated treatment well Patient left: in chair;with call bell/phone within reach;with chair alarm set  OT Visit Diagnosis: Unsteadiness on feet (R26.81);Muscle weakness (generalized) (M62.81);History of falling (Z91.81)  Time: 4163-8453 OT Time Calculation (min): 28 min Charges:  OT General Charges $OT Visit: 1 Visit OT Evaluation $OT Eval Moderate Complexity: 1 Mod  Divine Imber H. OTR/L Supplemental OT, Department of rehab services [phone]  Tania Steinhauser R H. 07/17/2020, 1:04 PM

## 2020-07-17 NOTE — Progress Notes (Signed)
PROGRESS NOTE    Victoria Winters  WFU:[id number] DOB: [birth year] DOA: 07/16/2020 PCP: Alfonse Flavors, MD  Brief Narrative:  HPI per Dr. Karmen Bongo on 07/16/20 Victoria Winters is a 69 y.o. female with medical history significant of CVA: stage IV CKD; dementia; chronic diastolic CHF; and DM presenting with abdominal pain.  She was previously admitted from 7/6-9 with an acute comminuted fracture of the distal L tibia and fibula and fracture of the 4/5 metatarsal necks, s/p ORIF on 7/6.  She returned to the ER yesterday (8/9) for syncope x 2.  She described abdominal pain; CT showed fecal retention and possible mild stercoral colitis.   She was noted be hypoxic and was placed on 2L O2.  She was sent back to SNF but returned this AM with c/o abdominal distention, AMS.  She had a large BM while in radiology this AM.  AXR shows prominent stool throughout the colon.  The patient is unable to provide significant history.  She is uncertain why she is here, denies pain, and reports that she is unable to see her abdomen so doesn't know if it is distended.  She is oriented x 1.  I spoke with her sister.  She was at home prior to her last hospitalization.  She was having spells/drop attacks since 2019 and before.  At baseline, she has variable cognitive function - she has days where she can't communicate at all.  She has short-term memory loss and periodic hallucinations.  Her usual life is difficulty remembering short-term issues but had long-term memory.  Her functional status varied prior to hospitalization - goal was to use the walker to mobilize but she often wasn't able to get up without significant assistance and couldn't walker.  She is no longer able to lift her and she doesn't think she can care for her at home anymore.  She is not surprised about her worsening renal function; she is not sure if she is a candidate or not but it was mentioned in the past.   She does not want her to suffer, to "needlessly  live a life, she's just got so much wrong, I don't want to suffer."  She is not inclined to proceed with HD.  She doesn't want to scare her or present her with information.  She is open to palliative care but does not want her sister to know it because she fears that will scare her.      ED Course:  AKI, ?mild colitis.  In SNF, had abdominal pain and distention yesterday - sent to ER.  CT with constipation, ?bowel thickening, normal WBC.  Sent back with stool softeners.  Returned with the same.  On exam, TTP and distention.  No leukocytosis, no stool in rectal vault.  Creatinine 2.99, 3.6 yesterday, 4.07 today.  UA pending.  Started on antibiotics for possible colitis.  Needs IVF.  **Interim History  Patient remains a little confused and does not recall certain events.  Continues to have some abdominal pain.  States that she is feeling "terrible" but does not elaborate.  IV fluid hydration is being continued and for renal function is improving.  Palliative still to see the patient.  Assessment & Plan:   Principal Problem:   AKI (acute kidney injury) ([place]) Active Problems:   CKD (chronic kidney disease) IV   DM (diabetes mellitus) ([place])   Goals of care, counseling/discussion   DNR (do not resuscitate)   (HFpEF) heart failure with preserved ejection fraction - Grade  2 diastolic dysfunction   Vascular dementia with behavior disturbance (HCC)   Obesity, Class III, BMI 40-49.9 (morbid obesity) ([place])  AKI on CKD Stage IV vs Progressive CKD Stage IV -Baseline creatinine appears to be about 3 -Creatinine has worsened over the last month, although the GFR is only gradually worsening -UA without evidence of current dehydration -There may an acute component to this, but suspect instead that this is more related to progressive CKD, now stage 5 -Anemia is likely due to renal disease; recent transfusion, appears to be stable but slightly dropped as BUN/Cr went from 9.8/32.1 -> 8.5/27.2 -Will admit  to Med Surg -Will hydrate with gentle IVF and currently getting LR at 75 mL/hr -Recheck CMP in AM; BUN/Cr went from 53/4.07 -> 51/3.55 -If not improving, nephrology consultation could be considered -However, she does not appear to be a good candidate for HD and her sister is not inclined to proceed with HD at this time -Will request palliative care consultation  Constipation -Will order bowel regimen  -No evidence of sepsis -She currently denies pain but when palpating she has pain  -Will hold antibiotics at this time (Started in the ED for ? Colitis)  -Continue Senokot, Miralax -Will add tap water enema as well as manual disimpaction  Vascular dementia with behavioral disturbance in the setting of CVA -Patient has significant difficulty with her short-term memory -Also with hallucinations -Continue Escitalopram 10 mg po every Morning , Quetiapine 25 mg po qHS -  Recent comminuted fracture of the distal left tibia and fibula along with fracture of the fourth and fifth metatarsal necks -Status post ORIF left distal tibia, status post ORIF left fibula and lateral malleolus on 7/6. -At SNF for rehab -Will need return to rehab once improving   Chronic Diastolic CHF -Appears to be compensated at this time -Prior echo in 04/2020 with preserved EF and grade 2 diastolic dysfunction -Hold diuretics and gently hydrate based on worsening renal function -Continue to Monitor for S/Sx of Volume overload; She is +1.340 Liters since admission  -Strict I's and O's and Daily Weights   DM -Recent A1c was 8,1, indicating poor control -hold Tradjenta -Continue insulin -Cover with sensitive-scale SSI and on Lantus 10 units sq Daily qHS -Given her long-term prognosis, tight glycemic control is not essential  -CBG's ranging from 138-183  HTN -Patient on anti-HTN meds (hydralazine, Toprol-XL) but also midodrine (see below) -Will hold BP meds, including Midodrine, and monitor for now -Will  cover with prn IV hydralazine -BP was elevated today and was 159/60  HLD -Continue Atrovastatin 20 mg po qHS  Adrenal insufficiency -Continue Fludrocortisone 0.2 mg po Daily   Chronic Pain -Continue Pregabalin 75 mg po BID, Oxycodone IR 5 mg po q4hprn Severe Pain   Obesity -Estimated body mass index is 39.68 kg/m as calculated from the following:   Height as of this encounter: 5\' 1"  (1.549 m).   Weight as of this encounter: 95.3 kg. -Weight loss should be encouraged as well as Dietary Counseling   -Has apparent OHS for which she uses periodic [place] O2 at home  Goals of Care: DNR, poA -Patient is DNR -Her sister prefers to focus on comfort measures rather than aggressive measures -At this time, it does not appear that she will be pursuing HD  -Palliative care consultation pending    DVT prophylaxis: Enoxaparin Code Status: DO NOT RESUSCITATE  Family Communication: No family present at bedside  Disposition Plan: Pending further improvement and evaluation by palliative care prior  to safe discharge disposition likely going back to her SNF  Status is: Inpatient  Remains inpatient appropriate because:Altered mental status, Unsafe d/c plan, IV treatments appropriate due to intensity of illness or inability to take PO and Inpatient level of care appropriate due to severity of illness   Dispo: The patient is from: SNF              Anticipated d/c is to: SNF              Anticipated d/c date is: 2 days              Patient currently is not medically stable to d/c.  Consultants:   Palliative Care Medicine   Procedures:   None  Antimicrobials:  Anti-infectives (From admission, onward)   Start     Dose/Rate Route Frequency Ordered Stop   07/16/20 1445  cefTRIAXone (ROCEPHIN) 2 g in sodium chloride 0.9 % 100 mL IVPB  Status:  Discontinued        2 g 200 mL/hr over 30 Minutes Intravenous Every 24 hours 07/16/20 1439 07/16/20 1818   07/16/20 1430  metroNIDAZOLE (FLAGYL)  tablet 500 mg  Status:  Discontinued        500 mg Oral  Once 07/16/20 1416 07/16/20 1818     Subjective: Seen and examined at bedside and she was feeling "bad" and "terrible" but not able to elaborate.  Does not know why she came to the hospital.  No chest pain.  Lightheadedness or dizziness.  I asked about abdominal pain and she currently states no but when I palpate her abdomen she states that she does have some abdominal pain then.  Knows that she broke her left leg.  No nausea or vomiting.  No other concerns or complaints at this time.  Objective: Vitals:   07/16/20 2224 07/17/20 0157 07/17/20 0531 07/17/20 0748  BP: (!) 139/50 (!) 128/47 (!) 146/57 (!) 159/60  Pulse: 78 72 75 77  Resp: 18 18 17 18   Temp: 97.9 F (36.6 C) 98.4 F (36.9 C) 98.7 F (37.1 C) 98.7 F (37.1 C)  TempSrc: Axillary Oral Oral Oral  SpO2: 92% (!) 89% 91% 90%  Weight:  95.3 kg    Height:  5\' 1"  (1.549 m)      Intake/Output Summary (Last 24 hours) at 07/17/2020 1538 Last data filed at 07/17/2020 1532 Gross per 24 hour  Intake 1590.72 ml  Output 350 ml  Net 1240.72 ml   Filed Weights   07/17/20 0157  Weight: 95.3 kg   Examination: Physical Exam:  Constitutional: WN/WD obese Caucasian female currently in NAD and appears calm but does appear slightly uncomfortable sitting in chair bedside Eyes: Lids and conjunctivae normal, sclerae anicteric  ENMT: External Ears, Nose appear normal. Grossly normal hearing.  Neck: Appears normal, supple, no cervical masses, normal ROM, no appreciable thyromegaly; no JVD Respiratory: Diminished to auscultation bilaterally, no wheezing, rales, rhonchi or crackles. Normal respiratory effort and patient is not tachypenic. No accessory muscle use.  Unlabored breathing Cardiovascular: RRR, no murmurs / rubs / gallops. S1 and S2 auscultated.  Left leg is in a cast Abdomen: Soft, slightly tender to palpate, distended secondary body habitus. Bowel sounds positive.  GU:  Deferred. Musculoskeletal: No clubbing / cyanosis of digits/nails. No joint deformity upper and lower extremities but her left leg is in the cast Skin: No rashes, lesions, ulcers on limited skin evaluation. No induration; Warm and dry.  Neurologic: CN 2-12 grossly intact with no  focal deficits. Romberg sign and cerebellar reflexes not assessed.  Psychiatric: Impaired judgment and insight.  She is awake and alert but not fully oriented. Normal mood and appropriate affect.   Data Reviewed: I have personally reviewed following labs and imaging studies  CBC: Recent Labs  Lab 07/15/20 1213 07/16/20 0916 07/17/20 0113  WBC 14.6* 9.5 7.5  NEUTROABS 10.2* 5.2  --   HGB 9.5* 9.8* 8.5*  HCT 31.0* 32.1* 27.2*  MCV 86.4 87.9 86.6  PLT 168 190 096   Basic Metabolic Panel: Recent Labs  Lab 07/15/20 1213 07/16/20 0916 07/17/20 0113  NA 138 141 144  K 4.4 4.1 3.6  CL 97* 98 101  CO2 27 29 30   GLUCOSE 243* 152* 142*  BUN 42* 53* 51*  CREATININE 3.61* 4.07* 3.55*  CALCIUM 8.2* 8.4* 7.8*   GFR: Estimated Creatinine Clearance: 15.8 mL/min (A) (by C-G formula based on SCr of 3.55 mg/dL (H)). Liver Function Tests: Recent Labs  Lab 07/15/20 1213 07/16/20 0916  AST 15 16  ALT 12 12  ALKPHOS 83 91  BILITOT 0.7 0.9  PROT 5.7* 6.2*  ALBUMIN 2.9* 2.9*   Recent Labs  Lab 07/15/20 1213 07/16/20 0916  LIPASE 27 22   No results for input(s): AMMONIA in the last 168 hours. Coagulation Profile: No results for input(s): INR, PROTIME in the last 168 hours. Cardiac Enzymes: No results for input(s): CKTOTAL, CKMB, CKMBINDEX, TROPONINI in the last 168 hours. BNP (last 3 results) No results for input(s): PROBNP in the last 8760 hours. HbA1C: No results for input(s): HGBA1C in the last 72 hours. CBG: Recent Labs  Lab 07/16/20 2251 07/17/20 0745 07/17/20 1144  GLUCAP 140* 138* 183*   Lipid Profile: No results for input(s): CHOL, HDL, LDLCALC, TRIG, CHOLHDL, LDLDIRECT in the last 72  hours. Thyroid Function Tests: No results for input(s): TSH, T4TOTAL, FREET4, T3FREE, THYROIDAB in the last 72 hours. Anemia Panel: No results for input(s): VITAMINB12, FOLATE, FERRITIN, TIBC, IRON, RETICCTPCT in the last 72 hours. Sepsis Labs: No results for input(s): PROCALCITON, LATICACIDVEN in the last 168 hours.  Recent Results (from the past 240 hour(s))  SARS Coronavirus 2 by RT PCR (hospital order, performed in [facility] hospital lab) Nasopharyngeal Nasopharyngeal Swab     Status: None   Collection Time: 07/16/20  3:34 PM   Specimen: Nasopharyngeal Swab  Result Value Ref Range Status   SARS Coronavirus 2 NEGATIVE NEGATIVE Final    Comment: (NOTE) SARS-CoV-2 target nucleic acids are NOT DETECTED.  The SARS-CoV-2 RNA is generally detectable in upper and lower respiratory specimens during the acute phase of infection. The lowest concentration of SARS-CoV-2 viral copies this assay can detect is 250 copies / mL. A negative result does not preclude SARS-CoV-2 infection and should not be used as the sole basis for treatment or other patient management decisions.  A negative result may occur with improper specimen collection / handling, submission of specimen other than nasopharyngeal swab, presence of viral mutation(s) within the areas targeted by this assay, and inadequate number of viral copies (<250 copies / mL). A negative result must be combined with clinical observations, patient history, and epidemiological information.  Fact Sheet for Patients:   StrictlyIdeas.no  Fact Sheet for Healthcare Providers: BankingDealers.co.za  This test is not yet approved or  cleared by the [place] FDA and has been authorized for detection and/or diagnosis of SARS-CoV-2 by FDA under an Emergency Use Authorization (EUA).  This EUA will remain in effect (meaning this test  can be used) for the duration of the COVID-19 declaration under  Section 564(b)(1) of the Act, 21 U.S.C. section 360bbb-3(b)(1), unless the authorization is terminated or revoked sooner.  Performed at [facility], [place] [address] [place], [place] [address]      RN Pressure Injury Documentation:     Estimated body mass index is 39.68 kg/m as calculated from the following:   Height as of this encounter: 5\' 1"  (1.549 m).   Weight as of this encounter: 95.3 kg.  Malnutrition Type:      Malnutrition Characteristics:      Nutrition Interventions:    Radiology Studies: DG Abd Acute W/Chest  Result Date: 07/16/2020 CLINICAL DATA:  Abdominal distension. EXAM: DG ABDOMEN ACUTE W/ 1V CHEST COMPARISON:  CT abdomen pelvis from yesterday. FINDINGS: Unchanged cardiomegaly. Normal pulmonary vascularity. No focal consolidation, pleural effusion, or pneumothorax. No acute osseous abnormality. 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 There is no evidence of dilated bowel loops or free intraperitoneal air. Large amount of stool throughout the colon. No radiopaque calculi or other significant radiographic abnormality is seen. IMPRESSION: 1. Prominent stool throughout the colon. Correlate for constipation. 2. No acute cardiopulmonary disease. Electronically Signed   By: Titus Dubin M.D.   On: 07/16/2020 10:03   Scheduled Meds:  aspirin EC  81 mg Oral Daily   atorvastatin  20 mg Oral QHS   enoxaparin (LOVENOX) injection  30 mg Subcutaneous Q24H   escitalopram  10 mg Oral q morning - 10a   fludrocortisone  0.2 mg Oral Daily   insulin aspart  0-9 Units Subcutaneous TID WC   insulin glargine  10 Units Subcutaneous QHS   melatonin  5 mg Oral QHS   multivitamin with minerals  1 tablet Oral Daily   polyethylene glycol  17 g Oral Daily   pregabalin  75 mg Oral BID   QUEtiapine  25 mg Oral QHS   senna  2 tablet Oral BID   Continuous Infusions:  lactated ringers 75  mL/hr at 07/17/20 1237    LOS: 1 day   Kerney Elbe, DO [facility] PAGER is on [place]  If 7PM-7AM, please contact night-coverage [email or web address]

## 2020-07-17 NOTE — Social Work (Addendum)
5:02pm- Aware of palliative discussions- plan for return to [facility] if they are able to accept pt back with palliative services. Claiborne Billings, admissions liaison with [facility] has been notified.   11:11am- Pt w/ palliative consult, will follow for recs. [place] states pt Medicaid is pending, per chart it looks active. Have reached out to financial counseling to review.   10:41am- CSW acknowledging consult for SNF placement. Will follow for therapy recommendations needed to best determine disposition/for insurance authorization. Pt from [facility], have f/u with their admissions liaison to let them know where pt is and to assess any needs on their end.    Westley Hummer, MSW, [place] Work

## 2020-07-18 ENCOUNTER — Inpatient Hospital Stay (HOSPITAL_COMMUNITY): Payer: Medicare Other

## 2020-07-18 DIAGNOSIS — N179 Acute kidney failure, unspecified: Secondary | ICD-10-CM | POA: Diagnosis not present

## 2020-07-18 DIAGNOSIS — E1165 Type 2 diabetes mellitus with hyperglycemia: Secondary | ICD-10-CM | POA: Diagnosis not present

## 2020-07-18 DIAGNOSIS — N184 Chronic kidney disease, stage 4 (severe): Secondary | ICD-10-CM | POA: Diagnosis not present

## 2020-07-18 DIAGNOSIS — I5032 Chronic diastolic (congestive) heart failure: Secondary | ICD-10-CM | POA: Diagnosis not present

## 2020-07-18 LAB — COMPREHENSIVE METABOLIC PANEL
ALT: 10 U/L (ref 0–44)
AST: 12 U/L — ABNORMAL LOW (ref 15–41)
Albumin: 2.2 g/dL — ABNORMAL LOW (ref 3.5–5.0)
Alkaline Phosphatase: 64 U/L (ref 38–126)
Anion gap: 10 (ref 5–15)
BUN: 42 mg/dL — ABNORMAL HIGH (ref 8–23)
CO2: 29 mmol/L (ref 22–32)
Calcium: 7.8 mg/dL — ABNORMAL LOW (ref 8.9–10.3)
Chloride: 101 mmol/L (ref 98–111)
Creatinine, Ser: 2.91 mg/dL — ABNORMAL HIGH (ref 0.44–1.00)
GFR calc Af Amer: 18 mL/min — ABNORMAL LOW (ref >60)
GFR calc non Af Amer: 16 mL/min — ABNORMAL LOW (ref >60)
Glucose, Bld: 117 mg/dL — ABNORMAL HIGH (ref 70–99)
Potassium: 3 mmol/L — ABNORMAL LOW (ref 3.5–5.1)
Sodium: 140 mmol/L (ref 135–145)
Total Bilirubin: 0.2 mg/dL — ABNORMAL LOW (ref 0.3–1.2)
Total Protein: 4.5 g/dL — ABNORMAL LOW (ref 6.5–8.1)

## 2020-07-18 LAB — CBC WITH DIFFERENTIAL/PLATELET
Abs Immature Granulocytes: 0.04 10*3/uL (ref 0.00–0.07)
Basophils Absolute: 0 10*3/uL (ref 0.0–0.1)
Basophils Relative: 1 %
Eosinophils Absolute: 0.6 10*3/uL — ABNORMAL HIGH (ref 0.0–0.5)
Eosinophils Relative: 9 %
HCT: 24.2 % — ABNORMAL LOW (ref 36.0–46.0)
Hemoglobin: 7.6 g/dL — ABNORMAL LOW (ref 12.0–15.0)
Immature Granulocytes: 1 %
Lymphocytes Relative: 35 %
Lymphs Abs: 2.4 10*3/uL (ref 0.7–4.0)
MCH: 26.9 pg (ref 26.0–34.0)
MCHC: 31.4 g/dL (ref 30.0–36.0)
MCV: 85.5 fL (ref 80.0–100.0)
Monocytes Absolute: 0.9 10*3/uL (ref 0.1–1.0)
Monocytes Relative: 13 %
Neutro Abs: 2.9 10*3/uL (ref 1.7–7.7)
Neutrophils Relative %: 41 %
Platelets: 156 10*3/uL (ref 150–400)
RBC: 2.83 MIL/uL — ABNORMAL LOW (ref 3.87–5.11)
RDW: 14.8 % (ref 11.5–15.5)
WBC: 6.9 10*3/uL (ref 4.0–10.5)
nRBC: 0 % (ref 0.0–0.2)

## 2020-07-18 LAB — SARS CORONAVIRUS 2 BY RT PCR (HOSPITAL ORDER, PERFORMED IN ~~LOC~~ HOSPITAL LAB): SARS Coronavirus 2: NEGATIVE

## 2020-07-18 LAB — GLUCOSE, CAPILLARY
Glucose-Capillary: 106 mg/dL — ABNORMAL HIGH (ref 70–99)
Glucose-Capillary: 220 mg/dL — ABNORMAL HIGH (ref 70–99)
Glucose-Capillary: 219 mg/dL — ABNORMAL HIGH (ref 70–99)
Glucose-Capillary: 223 mg/dL — ABNORMAL HIGH (ref 70–99)

## 2020-07-18 LAB — MAGNESIUM: Magnesium: 2.5 mg/dL — ABNORMAL HIGH (ref 1.7–2.4)

## 2020-07-18 LAB — PHOSPHORUS: Phosphorus: 3.4 mg/dL (ref 2.5–4.6)

## 2020-07-18 MED ORDER — CALCIUM GLUCONATE-NACL 1-0.675 GM/50ML-% IV SOLN
1.0000 g | Freq: Once | INTRAVENOUS | Status: AC
  Administered 2020-07-18: 1000 mg via INTRAVENOUS
  Filled 2020-07-18: qty 50

## 2020-07-18 MED ORDER — POLYETHYLENE GLYCOL 3350 17 G PO PACK
17.0000 g | PACK | Freq: Two times a day (BID) | ORAL | Status: DC
  Administered 2020-07-18 – 2020-07-19 (×2): 17 g via ORAL
  Filled 2020-07-18 (×2): qty 1

## 2020-07-18 MED ORDER — POTASSIUM CHLORIDE CRYS ER 20 MEQ PO TBCR
40.0000 meq | EXTENDED_RELEASE_TABLET | Freq: Two times a day (BID) | ORAL | Status: AC
  Administered 2020-07-18 (×2): 40 meq via ORAL
  Filled 2020-07-18 (×2): qty 2

## 2020-07-18 MED ORDER — SENNOSIDES-DOCUSATE SODIUM 8.6-50 MG PO TABS
1.0000 | ORAL_TABLET | Freq: Two times a day (BID) | ORAL | Status: DC
  Administered 2020-07-18 – 2020-07-19 (×2): 1 via ORAL
  Filled 2020-07-18 (×2): qty 1

## 2020-07-18 MED ORDER — LACTATED RINGERS IV SOLN: INTRAVENOUS | Status: AC

## 2020-07-18 NOTE — NC FL2 (Signed)
[place] LEVEL OF CARE SCREENING TOOL     IDENTIFICATION  Patient Name: Victoria Winters Birthdate: 02/13/1951 Sex: female Admission Date (Current Location): 07/16/2020  [facility] and [place] Number:  Herbalist and Address:  [facility] [place], [place] [address] [place], [place] [address]      Provider Number: [id number]  Attending Physician Name and Address:  Kerney Elbe, DO  Relative Name and Phone Number:       Current Level of Care: Hospital Recommended Level of Care: [place] Prior Approval Number:    Date Approved/Denied:   PASRR Number: [id number]  Discharge Plan: SNF    Current Diagnoses: Patient Active Problem List   Diagnosis Date Noted  . Vascular dementia with behavior disturbance ([place]) 07/16/2020  . Obesity, Class III, BMI 40-49.9 (morbid obesity) ([place]) 07/16/2020  . AKI (acute kidney injury) ([place]) 06/12/2020  . Tibia/fibula fracture, left, closed, initial encounter 06/11/2020  . (HFpEF) heart failure with preserved ejection fraction - Grade 2 diastolic dysfunction 09/22/5101  . Acute on chronic heart failure with preserved ejection fraction (HFpEF) --grade 2 diastolic dysfunction 58/52/7782  . Acute respiratory failure with hypoxia ([place]) 04/23/2020  . Heart failure ([place]) 04/22/2020  . Acute and chronic respiratory failure with hypoxia ([place]) 04/17/2020  . DNR (do not resuscitate) 04/17/2020  . Leukocytosis 04/17/2020  . Anemia in chronic kidney disease (CKD) 04/17/2020  . Closed fracture of distal end of right fibula with routine healing 11/23/2019  . Palliative care by specialist   . Goals of care, counseling/discussion   . History of CVA (cerebrovascular accident)   . Acute encephalopathy 07/21/2019  . Fall 07/20/2019  . CKD (chronic kidney disease) IV 07/20/2019  . Brain aneurysm 07/20/2019  . DM (diabetes mellitus) ([place]) 07/20/2019  . Bilateral fibular fractures 07/20/2019  . CVA  (cerebral vascular accident) ([place]) 07/20/2019  . Hallucination 07/20/2019    Orientation RESPIRATION BLADDER Height & Weight     Self  Normal Incontinent Weight: 210 lb (95.3 kg) Height:  5\' 1"  (154.9 cm)  BEHAVIORAL SYMPTOMS/MOOD NEUROLOGICAL BOWEL NUTRITION STATUS      Incontinent Diet (see discharge summary)  AMBULATORY STATUS COMMUNICATION OF NEEDS Skin   Extensive Assist Verbally Other (Comment) (generalized ecchymosis; excoriation)                       Personal Care Assistance Level of Assistance  Bathing, Dressing, Feeding Bathing Assistance: Maximum assistance Feeding assistance: Maximum assistance Dressing Assistance: Maximum assistance     Functional Limitations Info  Sight, Speech, Hearing Sight Info: Adequate Hearing Info: Adequate Speech Info: Adequate    SPECIAL CARE FACTORS FREQUENCY  PT (By licensed PT), OT (By licensed OT)     PT Frequency: 5x week OT Frequency: 5x week            Contractures Contractures Info: Not present    Additional Factors Info  Allergies, Code Status, Psychotropic, Insulin Sliding Scale Code Status Info: DNR Allergies Info: Penicillins, Chicken Allergy, Contrast Media (Iodinated Diagnostic Agents), Eggs Or Egg-derived Products, Milk-related Compounds, Nickel, Oatmeal, Other, Vaccinium Angustifolium, Wheat Bran Psychotropic Info: escitalopram (LEXAPRO) tablet 10 mg every morning 10am; QUEtiapine (SEROQUEL) tablet 25 mg daily at bedtime PO Insulin Sliding Scale Info: insulin aspart (novoLOG) injection 0-9 Units 3x daily with meals; insulin glargine (LANTUS) injection 10 Units daily at bedtime       Current Medications (07/18/2020):  This is the current hospital active medication list Current Facility-Administered  Medications  Medication Dose Route Frequency Provider Last Rate Last Admin  . acetaminophen (TYLENOL) tablet 650 mg  650 mg Oral Q6H PRN Karmen Bongo, MD       Or  . acetaminophen (TYLENOL) suppository 650  mg  650 mg Rectal Q6H PRN Karmen Bongo, MD      . aspirin EC tablet 81 mg  81 mg Oral Daily Karmen Bongo, MD   81 mg at 07/18/20 0954  . atorvastatin (LIPITOR) tablet 20 mg  20 mg Oral QHS Karmen Bongo, MD   20 mg at 07/17/20 2134  . calcium carbonate (dosed in mg elemental calcium) suspension 500 mg of elemental calcium  500 mg of elemental calcium Oral Q6H PRN Karmen Bongo, MD      . camphor-menthol [facility]) lotion 1 application  1 application Topical W1X PRN Karmen Bongo, MD       And  . hydrOXYzine (ATARAX/VISTARIL) tablet 25 mg  25 mg Oral Q8H PRN Karmen Bongo, MD      . docusate sodium [facility]) enema 283 mg  1 enema Rectal PRN Karmen Bongo, MD      . enoxaparin (LOVENOX) injection 30 mg  30 mg Subcutaneous Q24H Karmen Bongo, MD   30 mg at 07/17/20 2035  . escitalopram (LEXAPRO) tablet 10 mg  10 mg Oral q morning - 10a Karmen Bongo, MD   10 mg at 07/18/20 0954  . feeding supplement (NEPRO CARB STEADY) liquid 237 mL  237 mL Oral TID PRN Karmen Bongo, MD      . fludrocortisone (FLORINEF) tablet 0.2 mg  0.2 mg Oral Daily Karmen Bongo, MD   0.2 mg at 07/18/20 0954  . hydrALAZINE (APRESOLINE) injection 5 mg  5 mg Intravenous Q4H PRN Karmen Bongo, MD      . insulin aspart (novoLOG) injection 0-9 Units  0-9 Units Subcutaneous TID WC Karmen Bongo, MD   3 Units at 07/17/20 1736  . insulin glargine (LANTUS) injection 10 Units  10 Units Subcutaneous QHS Karmen Bongo, MD   10 Units at 07/17/20 2134  . lactated ringers infusion   Intravenous Continuous Raiford Noble [place], [place] 50 mL/hr at 07/18/20 0953 New Bag at 07/18/20 0953  . melatonin tablet 5 mg  5 mg Oral QHS Karmen Bongo, MD   5 mg at 07/17/20 2135  . morphine 2 MG/ML injection 2 mg  2 mg Intravenous Q6H PRN Karmen Bongo, MD      . multivitamin with minerals tablet 1 tablet  1 tablet Oral Daily Karmen Bongo, MD   1 tablet at 07/18/20 0954  . ondansetron (ZOFRAN) tablet 4 mg  4 mg Oral Q6H PRN  Karmen Bongo, MD       Or  . ondansetron [facility]) injection 4 mg  4 mg Intravenous Q6H PRN Karmen Bongo, MD      . oxyCODONE (Oxy IR/ROXICODONE) immediate release tablet 5 mg  5 mg Oral Q4H PRN Karmen Bongo, MD      . polyethylene glycol (MIRALAX / GLYCOLAX) packet 17 g  17 g Oral Daily Dimple Nanas, RPH   17 g at 07/18/20 0953  . potassium chloride SA (KLOR-CON) CR tablet 40 mEq  40 mEq Oral BID Raiford Noble [place], DO   40 mEq at 07/18/20 0953  . pregabalin (LYRICA) capsule 75 mg  75 mg Oral BID Karmen Bongo, MD   75 mg at 07/18/20 0954  . QUEtiapine (SEROQUEL) tablet 25 mg  25 mg Oral QHS Karmen Bongo, MD   25 mg at  07/17/20 2134  . senna (SENOKOT) tablet 17.2 mg  2 tablet Oral BID Karmen Bongo, MD   17.2 mg at 07/18/20 0953  . sorbitol 70 % solution 30 mL  30 mL Oral PRN Karmen Bongo, MD      . zolpidem Lorrin Mais) tablet 5 mg  5 mg Oral QHS PRN Karmen Bongo, MD         Discharge Medications: Please see discharge summary for a list of discharge medications.  Relevant Imaging Results:  Relevant Lab Results:   Additional Information SSN: [place], [place]

## 2020-07-18 NOTE — Social Work (Signed)
Pt can return to [facility] tomorrow per admissions liaison Claiborne Billings, no auth required at this time. Requested new COVID swab to be ordered from Dr. Alfredia Ferguson.   Westley Hummer, MSW, [place] Work

## 2020-07-18 NOTE — Progress Notes (Signed)
PROGRESS NOTE    Victoria Winters  GUR:[id number] DOB: 1951-10-20 DOA: 07/16/2020 PCP: Alfonse Flavors, MD  Brief Narrative:  HPI per Dr. Karmen Bongo on 07/16/20 Victoria Winters is a 69 y.o. female with medical history significant of CVA: stage IV CKD; dementia; chronic diastolic CHF; and DM presenting with abdominal pain.  She was previously admitted from 7/6-9 with an acute comminuted fracture of the distal L tibia and fibula and fracture of the 4/5 metatarsal necks, s/p ORIF on 7/6.  She returned to the ER yesterday (8/9) for syncope x 2.  She described abdominal pain; CT showed fecal retention and possible mild stercoral colitis.   She was noted be hypoxic and was placed on 2L O2.  She was sent back to SNF but returned this AM with c/o abdominal distention, AMS.  She had a large BM while in radiology this AM.  AXR shows prominent stool throughout the colon.  The patient is unable to provide significant history.  She is uncertain why she is here, denies pain, and reports that she is unable to see her abdomen so doesn't know if it is distended.  She is oriented x 1.  I spoke with her sister.  She was at home prior to her last hospitalization.  She was having spells/drop attacks since 2019 and before.  At baseline, she has variable cognitive function - she has days where she can't communicate at all.  She has short-term memory loss and periodic hallucinations.  Her usual life is difficulty remembering short-term issues but had long-term memory.  Her functional status varied prior to hospitalization - goal was to use the walker to mobilize but she often wasn't able to get up without significant assistance and couldn't walker.  She is no longer able to lift her and she doesn't think she can care for her at home anymore.  She is not surprised about her worsening renal function; she is not sure if she is a candidate or not but it was mentioned in the past.   She does not want her to suffer, to "needlessly  live a life, she's just got so much wrong, I don't want to suffer."  She is not inclined to proceed with HD.  She doesn't want to scare her or present her with information.  She is open to palliative care but does not want her sister to know it because she fears that will scare her.      ED Course:  AKI, ?mild colitis.  In SNF, had abdominal pain and distention yesterday - sent to ER.  CT with constipation, ?bowel thickening, normal WBC.  Sent back with stool softeners.  Returned with the same.  On exam, TTP and distention.  No leukocytosis, no stool in rectal vault.  Creatinine 2.99, 3.6 yesterday, 4.07 today.  UA pending.  Started on antibiotics for possible colitis.  Needs IVF.  **Interim History  Patient remains a little confused and does not recall certain events.  Continued to have some abdominal pain but is less distended now and has had a medium-sized bowel movement.Wilburn Mylar she stated that she is feeling "terrible" but does not elaborate and today she states that she is feeling better..  IV fluid hydration is being continued and her renal function is improving.  Palliative saw the patient and will continue current treatment and have palliative follow-up at the facility.  She had a bowel movement earlier this morning after her KUB.  We will continue bowel regimen for now  and anticipate discharge to SNF in the next 24 to 48 hours.    Assessment & Plan:   Principal Problem:   AKI (acute kidney injury) ([place]) Active Problems:   CKD (chronic kidney disease) IV   DM (diabetes mellitus) ([place])   Goals of care, counseling/discussion   DNR (do not resuscitate)   (HFpEF) heart failure with preserved ejection fraction - Grade 2 diastolic dysfunction   Vascular dementia with behavior disturbance (HCC)   Obesity, Class III, BMI 40-49.9 (morbid obesity) ([place])  AKI on CKD Stage IV vs Progressive CKD Stage IV, improving  -Baseline creatinine appears to be about 3 -Creatinine has worsened over  the last month, although the GFR is only gradually worsening -UA without evidence of current dehydration -There may an acute component to this, but suspect instead that this is more related to progressive CKD, now stage 5 -Anemia is likely due to renal disease; recent transfusion, appears to be stable but slightly dropped as BUN/Cr went from 9.8/32.1 -> 8.5/27.2 -> 7.6/24.2 -Will admit to Med Surg -Will hydrate with gentle IVF and currently getting LR at 75 mL/hr and reduced to 50 mL/hr and will stop later today  -Recheck CMP in AM; BUN/Cr went from 53/4.07 -> 51/3.55 -> 42/2.91 -If not improving, nephrology consultation could be considered -However, she does not appear to be a good candidate for HD and her sister is not inclined to proceed with HD at this time -Palliative Care Consulted and will continue Treatment and will Have Palliative Follow at D/C  Constipation -Will order bowel regimen and continue  -No evidence of sepsis -She currently denies pain but when palpating she has pain  -Will hold antibiotics at this time (Started in the ED for ? Colitis)  -Continue Senokot, Miralax -Will add tap water enema as well as manual disimpaction -Had a Medium sized BM after her KUB; KUB this AM showed "large amount of stool throughout colon, but decreased colonic distention compared to prior study." -Also has Docusate Sodium Enemas 283 mg RC as needed for Severe Constipation -C/w Bowel Regimen at D/C   Vascular dementia with behavioral disturbance in the setting of CVA -Patient has significant difficulty with her short-term memory -Also with hallucinations but not hallucinating today -Continue Escitalopram 10 mg po every Morning , Quetiapine 25 mg po qHS -Delirium Precautions   Recent comminuted fracture of the distal left tibia and fibula along with fracture of the fourth and fifth metatarsal necks -Status post ORIF left distal tibia, status post ORIF left fibula and lateral malleolus on  7/6. -At SNF for rehab -Will need return to SNF for rehab as she is improving   Chronic Diastolic CHF -Appears to be compensated at this time -Prior echo in 04/2020 with preserved EF and grade 2 diastolic dysfunction -Hold diuretics and gently hydrate based on worsening renal function -Continue to Monitor for S/Sx of Volume overload; She is +0.554 Liters since admission  -Strict I's and O's and Daily Weights   DM -Recent A1c was 8,1, indicating poor control -hold Tradjenta -Continue insulin -Cover with sensitive-scale SSI and on Lantus 10 units sq Daily qHS -Given her long-term prognosis, tight glycemic control is not essential  -CBG's ranging from 106-220  HTN -Patient on anti-HTN meds (hydralazine, Toprol-XL) but also midodrine (see below) -Will hold BP meds, including Midodrine, and monitor for now -Will cover with prn IV hydralazine -BP was elevated today and was 146/61  Hypokalemia -Patient's K+ was 3.0 -Replete with po KCl 40 mEQ BID x2  -  Continue to Monitor and Replete as Necessary -Repeat CMP in the AM   Hypocalcemia -C/w Calcium Gluconate 1 gram IV and C/w Calcium Carbonate 500 mg po q6h -Continue to Monitor and Trend and repeat CMP in the AM   HLD -Continue Atrovastatin 20 mg po qHS  Adrenal insufficiency -Continue Fludrocortisone 0.2 mg po Daily   Chronic Pain -Continue Pregabalin 75 mg po BID, Oxycodone IR 5 mg po q4hprn Severe Pain   Obesity -Estimated body mass index is 39.68 kg/m as calculated from the following:   Height as of this encounter: 5\' 1"  (1.549 m).   Weight as of this encounter: 95.3 kg. -Weight loss should be encouraged as well as Dietary Counseling   -Has apparent OHS for which she uses periodic [place] O2 at home  Goals of Care: DNR, poA -Patient is DNR -Her sister prefers to focus on comfort measures rather than aggressive measures -At this time, it does not appear that she will be pursuing HD  -Palliative care consultation  done and will continue Treatment and have palliative follow at Facility   DVT prophylaxis: Enoxaparin Code Status: DO NOT RESUSCITATE  Family Communication: No family present at bedside  Disposition Plan: Anticipate D/C to SNF in the Next 24-48 hours  Status is: Inpatient  Remains inpatient appropriate because:Altered mental status, Unsafe d/c plan, IV treatments appropriate due to intensity of illness or inability to take PO and Inpatient level of care appropriate due to severity of illness   Dispo: The patient is from: SNF              Anticipated d/c is to: SNF              Anticipated d/c date is: 1 day              Patient currently is medically stable to d/c.  Consultants:   Palliative Care Medicine   Procedures:   None  Antimicrobials:  Anti-infectives (From admission, onward)   Start     Dose/Rate Route Frequency Ordered Stop   07/16/20 1445  cefTRIAXone (ROCEPHIN) 2 g in sodium chloride 0.9 % 100 mL IVPB  Status:  Discontinued        2 g 200 mL/hr over 30 Minutes Intravenous Every 24 hours 07/16/20 1439 07/16/20 1818   07/16/20 1430  metroNIDAZOLE (FLAGYL) tablet 500 mg  Status:  Discontinued        500 mg Oral  Once 07/16/20 1416 07/16/20 1818     Subjective: Seen and examined at bedside and she is still forgetful and little confused but better than yesterday and not complaining of any pain.  No nausea or vomiting.  Does have some abdominal pain.  Did not recall that she had a bowel movement today.  No other concerns or complaints at this time.  Objective: Vitals:   07/17/20 2013 07/18/20 0216 07/18/20 0553 07/18/20 1613  BP: 129/71 130/63 128/60 (!) 146/61  Pulse: 96 90 88 80  Resp: 18 17 18 20   Temp: 98.6 F (37 C) 98.3 F (36.8 C) 98.6 F (37 C) 98.5 F (36.9 C)  TempSrc: Oral Oral Oral Oral  SpO2: 94% 95% 95% 92%  Weight:      Height:        Intake/Output Summary (Last 24 hours) at 07/18/2020 1643 Last data filed at 07/18/2020 1500 Gross per 24  hour  Intake 203.75 ml  Output 1250 ml  Net -1046.25 ml   Filed Weights   07/17/20 0157  Weight:  95.3 kg   Examination: Physical Exam:  Constitutional: WN/WD obese Caucasian female currently in no acute distress sitting up in the bed appears more comfortable than yesterday Eyes: Lids and conjunctivae normal, sclerae anicteric  ENMT: External Ears, Nose appear normal. Grossly normal hearing.  Neck: Appears normal, supple, no cervical masses, normal ROM, no appreciable thyromegaly; no JVD Respiratory: Diminished to auscultation bilaterally, no wheezing, rales, rhonchi or crackles. Normal respiratory effort and patient is not tachypenic. No accessory muscle use.  Unlabored breathing Cardiovascular: RRR, no murmurs / rubs / gallops. S1 and S2 auscultated.  Left lower extremity is in a cast Abdomen: Soft, a little tender to palpate, distended secondary body habitus. Bowel sounds positive x4.  GU: Deferred. Musculoskeletal: No clubbing / cyanosis of digits/nails. No joint deformity upper and lower extremities. Skin: No rashes, lesions, ulcers on a limited skin evaluation. No induration; Warm and dry.  Neurologic: CN 2-12 grossly intact with no focal deficits. Romberg sign and cerebellar reflexes not assessed.  Psychiatric: Impaired judgment and insight. Alert and oriented x 2. Normal mood and appropriate affect.   Data Reviewed: I have personally reviewed following labs and imaging studies  CBC: Recent Labs  Lab 07/15/20 1213 07/16/20 0916 07/17/20 0113 07/18/20 0251  WBC 14.6* 9.5 7.5 6.9  NEUTROABS 10.2* 5.2  --  2.9  HGB 9.5* 9.8* 8.5* 7.6*  HCT 31.0* 32.1* 27.2* 24.2*  MCV 86.4 87.9 86.6 85.5  PLT 168 190 152 579   Basic Metabolic Panel: Recent Labs  Lab 07/15/20 1213 07/16/20 0916 07/17/20 0113 07/18/20 0251  NA 138 141 144 140  K 4.4 4.1 3.6 3.0*  CL 97* 98 101 101  CO2 27 29 30 29   GLUCOSE 243* 152* 142* 117*  BUN 42* 53* 51* 42*  CREATININE 3.61* 4.07* 3.55*  2.91*  CALCIUM 8.2* 8.4* 7.8* 7.8*  MG  --   --   --  2.5*  PHOS  --   --   --  3.4   GFR: Estimated Creatinine Clearance: 19.2 mL/min (A) (by C-G formula based on SCr of 2.91 mg/dL (H)). Liver Function Tests: Recent Labs  Lab 07/15/20 1213 07/16/20 0916 07/18/20 0251  AST 15 16 12*  ALT 12 12 10   ALKPHOS 83 91 64  BILITOT 0.7 0.9 0.2*  PROT 5.7* 6.2* 4.5*  ALBUMIN 2.9* 2.9* 2.2*   Recent Labs  Lab 07/15/20 1213 07/16/20 0916  LIPASE 27 22   No results for input(s): AMMONIA in the last 168 hours. Coagulation Profile: No results for input(s): INR, PROTIME in the last 168 hours. Cardiac Enzymes: No results for input(s): CKTOTAL, CKMB, CKMBINDEX, TROPONINI in the last 168 hours. BNP (last 3 results) No results for input(s): PROBNP in the last 8760 hours. HbA1C: No results for input(s): HGBA1C in the last 72 hours. CBG: Recent Labs  Lab 07/17/20 1144 07/17/20 1653 07/17/20 2101 07/18/20 0744 07/18/20 1254  GLUCAP 183* 201* 168* 106* 220*   Lipid Profile: No results for input(s): CHOL, HDL, LDLCALC, TRIG, CHOLHDL, LDLDIRECT in the last 72 hours. Thyroid Function Tests: No results for input(s): TSH, T4TOTAL, FREET4, T3FREE, THYROIDAB in the last 72 hours. Anemia Panel: No results for input(s): VITAMINB12, FOLATE, FERRITIN, TIBC, IRON, RETICCTPCT in the last 72 hours. Sepsis Labs: No results for input(s): PROCALCITON, LATICACIDVEN in the last 168 hours.  Recent Results (from the past 240 hour(s))  SARS Coronavirus 2 by RT PCR (hospital order, performed in [facility] hospital lab) Nasopharyngeal Nasopharyngeal Swab  Status: None   Collection Time: 07/16/20  3:34 PM   Specimen: Nasopharyngeal Swab  Result Value Ref Range Status   SARS Coronavirus 2 NEGATIVE NEGATIVE Final    Comment: (NOTE) SARS-CoV-2 target nucleic acids are NOT DETECTED.  The SARS-CoV-2 RNA is generally detectable in upper and lower respiratory specimens during the acute phase of  infection. The lowest concentration of SARS-CoV-2 viral copies this assay can detect is 250 copies / mL. A negative result does not preclude SARS-CoV-2 infection and should not be used as the sole basis for treatment or other patient management decisions.  A negative result may occur with improper specimen collection / handling, submission of specimen other than nasopharyngeal swab, presence of viral mutation(s) within the areas targeted by this assay, and inadequate number of viral copies (<250 copies / mL). A negative result must be combined with clinical observations, patient history, and epidemiological information.  Fact Sheet for Patients:   StrictlyIdeas.no  Fact Sheet for Healthcare Providers: BankingDealers.co.za  This test is not yet approved or  cleared by the [place] FDA and has been authorized for detection and/or diagnosis of SARS-CoV-2 by FDA under an Emergency Use Authorization (EUA).  This EUA will remain in effect (meaning this test can be used) for the duration of the COVID-19 declaration under Section 564(b)(1) of the Act, 21 U.S.C. section 360bbb-3(b)(1), unless the authorization is terminated or revoked sooner.  Performed at [facility], [place] [address], [place], [place] [address]      RN Pressure Injury Documentation:     Estimated body mass index is 39.68 kg/m as calculated from the following:   Height as of this encounter: 5\' 1"  (1.549 m).   Weight as of this encounter: 95.3 kg.  Malnutrition Type:      Malnutrition Characteristics:      Nutrition Interventions:    Radiology Studies: DG Abd 1 View  Result Date: 07/18/2020 CLINICAL DATA:  Constipation. EXAM: ABDOMEN - 1 VIEW COMPARISON:  07/16/2020 FINDINGS: Large amount of stool is seen throughout colon. Decreased colonic distention is seen compared to prior study. No evidence of dilated small bowel loops. IMPRESSION: large amount  of stool throughout colon, but decreased colonic distention compared to prior study. Electronically Signed   By: Marlaine Hind M.D.   On: 07/18/2020 10:01   Scheduled Meds:  aspirin EC  81 mg Oral Daily   atorvastatin  20 mg Oral QHS   enoxaparin (LOVENOX) injection  30 mg Subcutaneous Q24H   escitalopram  10 mg Oral q morning - 10a   fludrocortisone  0.2 mg Oral Daily   insulin aspart  0-9 Units Subcutaneous TID WC   insulin glargine  10 Units Subcutaneous QHS   melatonin  5 mg Oral QHS   multivitamin with minerals  1 tablet Oral Daily   polyethylene glycol  17 g Oral Daily   potassium chloride  40 mEq Oral BID   pregabalin  75 mg Oral BID   QUEtiapine  25 mg Oral QHS   senna  2 tablet Oral BID   Continuous Infusions:  lactated ringers 50 mL/hr at 07/18/20 0953    LOS: 2 days   Kerney Elbe, DO [facility] PAGER is on AMION  If 7PM-7AM, please contact night-coverage [email or web address]

## 2020-07-18 NOTE — Progress Notes (Signed)
Hydrologist [facility])  Hospital Liaison: RN note         Notified by [facility] manager of patient/family request for [facility] Palliative services at [facility] after discharge.                [facility] Palliative team will follow up with patient after discharge.         Please call with any hospice or palliative related questions.         Thank you for this referral.         Farrel Gordon, RN, CCM  [place] (listed on [place] under Hospice/[facility])    [phone]

## 2020-07-18 NOTE — TOC Initial Note (Addendum)
Transition of Care [facility]) - Initial/Assessment Note    Patient Details  Name: Victoria Winters MRN: [id number] Date of Birth: March 26, 1951  Transition [facility]) CM/SW Contact:    Alexander Mt, LCSW Phone Number: 07/18/2020, 12:25 PM  Clinical Narrative:                 CSW spoke with pt sister Victoria Winters via telephone ([phone]). Introduced self, role, reason for call. Pt from [facility] where she had been for STR with the hope for LTC placement. Pt sister preferred disposition is for pt to return to [facility] at discharge, she is aware from my explanation that [facility] may not have LTC bed available and that we would need to explore other options including [facility] ([facility]) if no bed available. Pt sister confirmed that pt has been approved for Medicaid and her card arrived. Pt sister also mentioned that pt had been signed up for [facility] Medicare "nursing home plan". CSW to remain in contact with pt sister after assessing ability for pt to return to [facility].   CSW called and confirmed with [facility] Medicare that pt not managed by [facility]. Claiborne Billings, admissions with [facility] to review pt returning with her leadership and will be in touch with this Probation officer. CSW had requested a new COVID swab be ordered by Dr. Alfredia Ferguson.  CSW also has reached out to Bevely Palmer, RN liaison with [facility] to follow pt for palliative care services at [facility].   Expected Discharge Plan: Skilled Nursing Facility Barriers to Discharge: Continued Medical Work up, Ship broker   Patient Goals and CMS Choice Patient states their goals for this hospitalization and ongoing recovery are:: return to [facility]; LTC SNF placement CMS Medicare.gov Compare Post Acute Care list provided to:: Patient Represenative (must comment) (pt sister Victoria Winters) Choice offered to / list presented to : Sibling  Expected Discharge Plan and Services Expected Discharge Plan:  [place] In-house Referral: Clinical Social Work Discharge Planning Services: CM Consult Post Acute Care Choice: [facility], Resumption of Svcs/PTA Provider Living arrangements for the past 2 months: Single Family Home, [place]  Prior Living Arrangements/Services Living arrangements for the past 2 months: [place], [place] Lives with:: Facility Resident Patient language and need for interpreter reviewed:: Yes (no needs)        Need for Family Participation in Patient Care: Yes (Comment) (assistance w/ daily cares; assistance w/ decision making) Care giver support system in place?: Yes (comment) (facility staff; sister)   Criminal Activity/Legal Involvement Pertinent to Current Situation/Hospitalization: No - Comment as needed  Permission Sought/Granted Permission sought to share information with : Family Supports, Chartered certified accountant granted to share information with : No (pt w/ fluctuating orientation)  Share Information with NAME: Victoria Winters  Permission granted to share info w AGENCY: [facility]  Permission granted to share info w Relationship: sister  Permission granted to share info w Contact Information: [phone]  Emotional Assessment Appearance:: Other (Comment Required (telephonic assessment w/ pt sister) Attitude/Demeanor/Rapport: Other (comment) (telephonic assessment w/ pt sister) Affect (typically observed): Other (comment) (telephonic assessment w/ pt sister) Orientation: : Oriented to Self, Oriented to  Time, Fluctuating Orientation (Suspected and/or reported [place]) Alcohol / Substance Use: Not Applicable Psych Involvement: No (comment)  Admission diagnosis:  Colitis [K52.9] AKI (acute kidney injury) ([place]) [N17.9] Patient Active Problem List   Diagnosis Date Noted  . Vascular dementia with behavior disturbance (  [place]) 07/16/2020  . Obesity, Class III,  BMI 40-49.9 (morbid obesity) ([place]) 07/16/2020  . AKI (acute kidney injury) ([place]) 06/12/2020  . Tibia/fibula fracture, left, closed, initial encounter 06/11/2020  . (HFpEF) heart failure with preserved ejection fraction - Grade 2 diastolic dysfunction 37/03/8888  . Acute on chronic heart failure with preserved ejection fraction (HFpEF) --grade 2 diastolic dysfunction 16/94/5038  . Acute respiratory failure with hypoxia ([place]) 04/23/2020  . Heart failure ([place]) 04/22/2020  . Acute and chronic respiratory failure with hypoxia ([place]) 04/17/2020  . DNR (do not resuscitate) 04/17/2020  . Leukocytosis 04/17/2020  . Anemia in chronic kidney disease (CKD) 04/17/2020  . Closed fracture of distal end of right fibula with routine healing 11/23/2019  . Palliative care by specialist   . Goals of care, counseling/discussion   . History of CVA (cerebrovascular accident)   . Acute encephalopathy 07/21/2019  . Fall 07/20/2019  . CKD (chronic kidney disease) IV 07/20/2019  . Brain aneurysm 07/20/2019  . DM (diabetes mellitus) ([place]) 07/20/2019  . Bilateral fibular fractures 07/20/2019  . CVA (cerebral vascular accident) ([place]) 07/20/2019  . Hallucination 07/20/2019   PCP:  Alfonse Flavors, MD Pharmacy:  No Pharmacies Listed  Readmission Risk Interventions Readmission Risk Prevention Plan 07/18/2020 06/12/2020 04/23/2020  Transportation Screening Complete Complete Complete  PCP or Specialist Appt within 3-5 Days - - Complete  Not Complete comments - - -  Medication Review (RN CM) - - -  [place] or [place] - - Complete  HRI or Home Care Consult comments - - -  Social Work Consult for [place] Planning/Counseling - - Complete  Palliative Care Screening - - Not Complete  Medication Review ([place]) Referral to Pharmacy Complete Complete  PCP or Specialist appointment within 3-5 days of discharge Not Complete Complete -  PCP/Specialist Appt Not Complete comments LTC SNF - -  [place] or  [facility] Not Complete Complete -  [place] or Home Care Consult Pt Refusal Comments LTC SNF - -  [facility] Recovery Care/Counseling Consult Complete Complete -  Palliative Care Screening Complete Complete -  Skilled Nursing Facility Complete Complete -

## 2020-07-19 DIAGNOSIS — E1165 Type 2 diabetes mellitus with hyperglycemia: Secondary | ICD-10-CM | POA: Diagnosis not present

## 2020-07-19 DIAGNOSIS — N179 Acute kidney failure, unspecified: Secondary | ICD-10-CM | POA: Diagnosis not present

## 2020-07-19 DIAGNOSIS — I5032 Chronic diastolic (congestive) heart failure: Secondary | ICD-10-CM | POA: Diagnosis not present

## 2020-07-19 DIAGNOSIS — Z66 Do not resuscitate: Secondary | ICD-10-CM | POA: Diagnosis not present

## 2020-07-19 LAB — CBC WITH DIFFERENTIAL/PLATELET
Abs Immature Granulocytes: 0.12 10*3/uL — ABNORMAL HIGH (ref 0.00–0.07)
Basophils Absolute: 0 10*3/uL (ref 0.0–0.1)
Basophils Relative: 1 %
Eosinophils Absolute: 0.8 10*3/uL — ABNORMAL HIGH (ref 0.0–0.5)
Eosinophils Relative: 10 %
HCT: 26.3 % — ABNORMAL LOW (ref 36.0–46.0)
Hemoglobin: 8.5 g/dL — ABNORMAL LOW (ref 12.0–15.0)
Immature Granulocytes: 2 %
Lymphocytes Relative: 34 %
Lymphs Abs: 2.6 10*3/uL (ref 0.7–4.0)
MCH: 27.3 pg (ref 26.0–34.0)
MCHC: 32.3 g/dL (ref 30.0–36.0)
MCV: 84.6 fL (ref 80.0–100.0)
Monocytes Absolute: 0.7 10*3/uL (ref 0.1–1.0)
Monocytes Relative: 10 %
Neutro Abs: 3.4 10*3/uL (ref 1.7–7.7)
Neutrophils Relative %: 43 %
Platelets: 171 10*3/uL (ref 150–400)
RBC: 3.11 MIL/uL — ABNORMAL LOW (ref 3.87–5.11)
RDW: 14.6 % (ref 11.5–15.5)
WBC: 7.7 10*3/uL (ref 4.0–10.5)
nRBC: 0 % (ref 0.0–0.2)

## 2020-07-19 LAB — COMPREHENSIVE METABOLIC PANEL
ALT: 10 U/L (ref 0–44)
AST: 14 U/L — ABNORMAL LOW (ref 15–41)
Albumin: 2.2 g/dL — ABNORMAL LOW (ref 3.5–5.0)
Alkaline Phosphatase: 67 U/L (ref 38–126)
Anion gap: 7 (ref 5–15)
BUN: 34 mg/dL — ABNORMAL HIGH (ref 8–23)
CO2: 31 mmol/L (ref 22–32)
Calcium: 8.1 mg/dL — ABNORMAL LOW (ref 8.9–10.3)
Chloride: 101 mmol/L (ref 98–111)
Creatinine, Ser: 2.53 mg/dL — ABNORMAL HIGH (ref 0.44–1.00)
GFR calc Af Amer: 22 mL/min — ABNORMAL LOW (ref >60)
GFR calc non Af Amer: 19 mL/min — ABNORMAL LOW (ref >60)
Glucose, Bld: 211 mg/dL — ABNORMAL HIGH (ref 70–99)
Potassium: 3.8 mmol/L (ref 3.5–5.1)
Sodium: 139 mmol/L (ref 135–145)
Total Bilirubin: 0.4 mg/dL (ref 0.3–1.2)
Total Protein: 4.9 g/dL — ABNORMAL LOW (ref 6.5–8.1)

## 2020-07-19 LAB — GLUCOSE, CAPILLARY
Glucose-Capillary: 171 mg/dL — ABNORMAL HIGH (ref 70–99)
Glucose-Capillary: 211 mg/dL — ABNORMAL HIGH (ref 70–99)

## 2020-07-19 LAB — MAGNESIUM: Magnesium: 2.3 mg/dL (ref 1.7–2.4)

## 2020-07-19 LAB — PHOSPHORUS: Phosphorus: 3.2 mg/dL (ref 2.5–4.6)

## 2020-07-19 MED ORDER — ACETAMINOPHEN 325 MG PO TABS: 650.0000 mg | ORAL_TABLET | Freq: Four times a day (QID) | ORAL | Status: AC | PRN

## 2020-07-19 MED ORDER — NEPRO/CARBSTEADY PO LIQD: 237.0000 mL | Freq: Three times a day (TID) | ORAL | 0 refills | Status: DC | PRN

## 2020-07-19 MED ORDER — SORBITOL 70 % SOLN: 30.0000 mL | Status: DC | PRN

## 2020-07-19 MED ORDER — OXYCODONE HCL 5 MG PO TABS: 5.0000 mg | ORAL_TABLET | ORAL | 0 refills | Status: DC | PRN

## 2020-07-19 MED ORDER — OXYCODONE HCL 5 MG PO TABS: 5.0000 mg | ORAL_TABLET | ORAL | 0 refills | Status: AC | PRN

## 2020-07-19 MED ORDER — POLYETHYLENE GLYCOL 3350 17 G PO PACK: 17.0000 g | PACK | Freq: Two times a day (BID) | ORAL | 0 refills | Status: DC

## 2020-07-19 MED ORDER — POTASSIUM CHLORIDE CRYS ER 20 MEQ PO TBCR
20.0000 meq | EXTENDED_RELEASE_TABLET | Freq: Every day | ORAL | 3 refills | Status: DC
Start: 2020-07-19 — End: 2020-08-19

## 2020-07-19 MED ORDER — CAMPHOR-MENTHOL 0.5-0.5 % EX LOTN: 1.0000 "application " | TOPICAL_LOTION | Freq: Three times a day (TID) | CUTANEOUS | 0 refills | Status: AC | PRN

## 2020-07-19 MED ORDER — TORSEMIDE 20 MG PO TABS
60.0000 mg | ORAL_TABLET | Freq: Every day | ORAL | 3 refills | Status: AC
Start: 2020-07-19 — End: 2020-10-17

## 2020-07-19 MED ORDER — METOPROLOL SUCCINATE ER 50 MG PO TB24: 50.0000 mg | ORAL_TABLET | Freq: Every morning | ORAL | Status: AC

## 2020-07-19 MED ORDER — SENNOSIDES-DOCUSATE SODIUM 8.6-50 MG PO TABS: 1.0000 | ORAL_TABLET | Freq: Two times a day (BID) | ORAL | Status: DC

## 2020-07-19 MED ORDER — PREGABALIN 75 MG PO CAPS: 75.0000 mg | ORAL_CAPSULE | Freq: Two times a day (BID) | ORAL | 0 refills | Status: DC

## 2020-07-19 MED ORDER — HYDROXYZINE HCL 25 MG PO TABS: 25.0000 mg | ORAL_TABLET | Freq: Three times a day (TID) | ORAL | 0 refills | Status: AC | PRN

## 2020-07-19 MED ORDER — ONDANSETRON HCL 4 MG PO TABS: 4.0000 mg | ORAL_TABLET | Freq: Four times a day (QID) | ORAL | 0 refills | Status: DC | PRN

## 2020-07-19 MED ORDER — MIDODRINE HCL 5 MG PO TABS: 5.0000 mg | ORAL_TABLET | Freq: Three times a day (TID) | ORAL | Status: DC

## 2020-07-19 NOTE — Progress Notes (Signed)
Physical Therapy Treatment Patient Details Name: Victoria Winters MRN: [id number] DOB: Dec 19, 1950 Today's Date: 07/19/2020    History of Present Illness Pt is a 69 y.o. F with significant PMH of CVA, stage IV CKD, dementia, CHF, DM, recent admission 7/6-9 with an comminuted fracutre of distal L tibia and fibula fracture s/p ORIF, who presents with AKI vs progressive CKD and constipation.    PT Comments    Patient received in bed sleeping. Roused to voice and touch. Agrees to PT. She is able to perform bed mobility with mod independence. Transfers with min +2 assist. She was found soaking wet in bed. Assisted patient with peri care. She required mod +2 assist to pivot to her right. Requires a foot under her left foot to prevent weight bearing. She will continue to benefit from continue skilled PT while here to improve functional independence and strength.        Follow Up Recommendations  SNF     Equipment Recommendations  None recommended by PT    Recommendations for Other Services       Precautions / Restrictions Precautions Precautions: Fall Precaution Comments: NWB L LE Required Braces or Orthoses: Splint/Cast Restrictions Weight Bearing Restrictions: Yes LLE Weight Bearing: Non weight bearing    Mobility  Bed Mobility Overal bed mobility: Modified Independent Bed Mobility: Sidelying to Sit;Supine to Sit   Sidelying to sit: Modified independent (Device/Increase time) Supine to sit: Modified independent (Device/Increase time)     General bed mobility comments: Patient able to sit up on side of bed with HOW elevated, but no physical assist provided  Transfers Overall transfer level: Needs assistance Equipment used: Rolling walker (2 wheeled) Transfers: Sit to/from [facility] Sit to Stand: Min assist;+2 physical assistance;+2 safety/equipment Stand pivot transfers: Min assist;+2 physical assistance;+2 safety/equipment       General transfer comment:  MinA + 2 to rise to stand from edge of bed, PT foot underneath L foot to block weightbearing. ModA + 2 for pivot towards right, cues for sequencing/direction and weighbearing precautions  Ambulation/Gait             General Gait Details: unable   Stairs             Wheelchair Mobility    Modified Rankin (Stroke Patients Only)       Balance Overall balance assessment: Needs assistance Sitting-balance support: Feet supported Sitting balance-Leahy Scale: Good     Standing balance support: Bilateral upper extremity supported;During functional activity Standing balance-Leahy Scale: Poor Standing balance comment: reliant on RW and external support                            Cognition Arousal/Alertness: Lethargic Behavior During Therapy: WFL for tasks assessed/performed Overall Cognitive Status: History of cognitive impairments - at baseline                                 General Comments: History of dementia and STM deficits. Pt not oriented to place, oriented to year, but not month. Following 1 step commands, cues for safety      Exercises      General Comments        Pertinent Vitals/Pain Pain Assessment: No/denies pain    Home Living                      Prior Function  PT Goals (current goals can now be found in the care plan section) Acute Rehab PT Goals Patient Stated Goal: did not state PT Goal Formulation: With patient Time For Goal Achievement: 07/31/20 Potential to Achieve Goals: Fair Progress towards PT goals: Progressing toward goals    Frequency    Min 2X/week      PT Plan Current plan remains appropriate    Co-evaluation              AM-PAC PT "6 Clicks" Mobility   Outcome Measure  Help needed turning from your back to your side while in a flat bed without using bedrails?: A Little Help needed moving from lying on your back to sitting on the side of a flat bed without  using bedrails?: A Little Help needed moving to and from a bed to a chair (including a wheelchair)?: A Lot Help needed standing up from a chair using your arms (e.g., wheelchair or bedside chair)?: A Little Help needed to walk in hospital room?: Total Help needed climbing 3-5 steps with a railing? : Total 6 Click Score: 13    End of Session Equipment Utilized During Treatment: Gait belt Activity Tolerance: Patient tolerated treatment well Patient left: in chair;with chair alarm set;with call bell/phone within reach;with nursing/sitter in room Nurse Communication: Mobility status PT Visit Diagnosis: Unsteadiness on feet (R26.81);Muscle weakness (generalized) (M62.81);Difficulty in walking, not elsewhere classified (R26.2);Other abnormalities of gait and mobility (R26.89)     Time: 1005-1030 PT Time Calculation (min) (ACUTE ONLY): 25 min  Charges:  $Therapeutic Activity: 23-37 mins                     Lastacia Solum, PT, GCS 07/19/20,10:44 AM

## 2020-07-19 NOTE — Plan of Care (Signed)

## 2020-07-19 NOTE — Progress Notes (Signed)
Patient discharged to [facility] as ordered.

## 2020-07-19 NOTE — Discharge Summary (Signed)
Physician Discharge Summary  Victoria Winters GYF:[id number] DOB: 1951-05-08 DOA: 07/16/2020  PCP: Alfonse Flavors, MD  Admit date: 07/16/2020 Discharge date: 07/19/2020  Admitted From: SNF Disposition: SNF  Recommendations for Outpatient Follow-up:  1. Follow up with PCP in 1-2 weeks 2. Follow-up with cardiology in outpatient setting 3. Follow-up with orthopedic surgery in the outpatient setting 4. Palliative Care to Follow at SNF 5. Please obtain CMP/CBC, Mag, Phos in one week 6. Please follow up on the following pending results:  Home Health: No Equipment/Devices: None recommended by PT  Discharge Condition: Stable  CODE STATUS: FULL CODE Diet recommendation: SOFT DIET  Brief/Interim Summary: HPI per Dr. Karmen Bongo on 07/16/20 Victoria Winters a 69 y.o.femalewith medical history significant ofCVA: stage IV CKD; dementia; chronic diastolic CHF; and DM presenting with abdominal pain. She was previously admitted from 7/6-9 with an acute comminuted fracture of the distal L tibia and fibula and fracture of the 4/5 metatarsal necks, s/p ORIF on 7/6. She returned to the ER yesterday (8/9) for syncope x 2. She described abdominal pain; CT showed fecal retention and possible mild stercoral colitis. She was noted be hypoxic and was placed on 2L O2. She was sent back to SNF but returned this AM with c/o abdominal distention, AMS. She had a large BM while in radiology this AM. AXR shows prominent stool throughout the colon. The patient is unable to provide significant history. She is uncertain why she is here, denies pain, and reports that she is unable to see her abdomen so doesn't know if it is distended. She is oriented x 1.  I spoke with her sister. She was at home prior to her last hospitalization. She was having spells/drop attacks since 2019 and before. At baseline, she has variable cognitive function - she has days where she can't communicate at all. She has short-term  memory loss and periodic hallucinations. Her usual life is difficulty remembering short-term issues but had long-term memory. Her functional status varied prior to hospitalization - goal was to use the walker to mobilize but she often wasn't able to get up without significant assistance and couldn't walker. She is no longer able to lift her and she doesn't think she can care for her at home anymore. She is not surprised about her worsening renal function; she is not sure if she is a candidate or not but it was mentioned in the past. She does not want her to suffer, to "needlessly live a life, she's just got so much wrong, I don't want to suffer." She is not inclined to proceed with HD. She doesn't want to scare her or present her with information. She is open to palliative care but does not want her sister to know it because she fears that will scare her.     ED Course:AKI, ?mild colitis. In SNF, had abdominal pain and distention yesterday - sent to ER. CT with constipation, ?bowel thickening, normal WBC. Sent back with stool softeners. Returned with the same. On exam, TTP and distention. No leukocytosis, no stool in rectal vault. Creatinine 2.99, 3.6 yesterday, 4.07 today. UA pending. Started on antibiotics for possible colitis. Needs IVF.  **Interim History  Patient remains a little confused and does not recall certain events.  Continued to have some abdominal pain but is less distended now and has had a medium-sized bowel movement.Wilburn Mylar she stated that she is feeling "terrible" but does not elaborate and today she states that she is feeling better..  IV fluid  hydration is being continued and her renal function is improving.  Palliative saw the patient and will continue current treatment and have palliative follow-up at the facility.  She had a bowel movement earlier yesterday morning after her KUB.  We will continue bowel regimen for now and she is stable to D/C to SNF  today.  Discharge Diagnoses:  Principal Problem:   AKI (acute kidney injury) ([place]) Active Problems:   CKD (chronic kidney disease) IV   DM (diabetes mellitus) ([place])   Goals of care, counseling/discussion   DNR (do not resuscitate)   (HFpEF) heart failure with preserved ejection fraction - Grade 2 diastolic dysfunction   Vascular dementia with behavior disturbance (HCC)   Obesity, Class III, BMI 40-49.9 (morbid obesity) ([place])  AKI on CKD Stage IV vs Progressive CKD Stage IV, improving (Suspect AKI on CKD4) -Baseline creatinine appears to be about 3 -Creatinine has worsened over the last month, although the GFR is only gradually worsening -UA without evidence of current dehydration -There may an acute component to this, but suspect instead that this is more related to progressive CKD, now stage 5 -Anemia is likely due to renal disease; recent transfusion, appears to be stable but slightly dropped as BUN/Cr went from 9.8/32.1 -> 8.5/27.2 -> 7.6/24.2 -Will admit to Med Surg -Will hydrate with gentle IVF and currently getting LR at 75 mL/hr and reduced to 50 mL/hr and will stop later today  -Recheck CMP in AM; BUN/Cr went from 53/4.07 -> 51/3.55 -> 42/2.91 -> 34/2.53 -If not improving, nephrology consultation could be considered -However, she does not appear to be a good candidate for HD and her sister is not inclined to proceed with HD at this time -Palliative Care Consulted and will continue Treatment and will Have Palliative Follow at D/C  Constipation, improved -Will order bowel regimen and continue  -No evidence of sepsis -She currently denies pain but when palpating she has pain  -Will hold antibiotics at this time (Started in the ED for ? 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 -At SNF for rehab -Will need return to SNF for rehab as she is improving   Chronic Diastolic CHF -Appears to be compensated at this time -Prior echo in 04/2020 with preserved EF and grade 2 diastolic dysfunction -Hold diuretics and gently hydrate based on worsening renal function -Continue to Monitor for S/Sx of Volume overload; She is +74.2 mLiters since admission  -Strict I's and O's and Daily Weights  -We will resume metoprolol succinate but at half the dose at 50 mg p.o. daily -Continue with hydralazine 25 mg p.o. 3 times daily -We will resume the torsemide at half the dose of 60 mL p.o. daily along with 20 mEq of KCl -Follow-up  with cardiology in outpatient setting for further adjustments   DM -Recent A1c was 8,1, indicating poor control -holdTradjenta while hospitalized but resume at discharge -Continue insulin -Cover withsensitive-scale SSI and on Lantus 10 units sq Daily qHS -Given her long-term prognosis, tight glycemic control is not essential  -CBG's ranging from 171-223  HTN -Patient on anti-HTN meds (hydralazine, Toprol-XL and will continue hydralazine 25 mg 3 times daily at discharge and reduce Toprol-XL to 50 mg daily) but also midodrine and will change to 5 mg p.o. 3 times daily instead of 10 mg daily (see below) -Initially held BP meds, including  Midodrine, and monitor for now -Will cover with prn IV hydralazine -BP today is 137/60  Hypokalemia -Patient's K+ was 3.0 and is now 3.8 -Replete with po KCl 40 mEQ BID x2 yesterday   -We will resume her home p.o. KCl but will reduce it to half the dose given that we have cutting her torsemide to half the dose -Continue to Monitor and Replete as Necessary -Repeat CMP in the AM   Hypocalcemia -C/w Calcium Gluconate 1 gram IV and C/w Calcium Carbonate 500 mg po q6h -Ca2+ is now 8.1 and corrected for Albumin is now 9.5 -Continue to Monitor and Trend and repeat CMP in the AM   HLD -Continue Atrovastatin 20 mg po qHS  Adrenal insufficiency -Continue Fludrocortisone 0.2 mg po Daily  -We will reduce midodrine to 5 mg p.o. 3 times daily and have positional SNF further evaluate for up titration -follow-up with PCP and/or cardiology for further evaluation  Chronic Pain -Continue Pregabalin 75 mg po BID, Oxycodone IR 5 mg po q4hprn Severe Pain   Obesity -Estimated body mass index is 39.68 kg/m as calculated from the following:   Height as of this encounter: 5\' 1"  (1.549 m).   Weight as of this encounter: 95.3 kg. -Weight loss should be encouraged as well as Dietary Counseling   -Has apparent OHS for which she uses periodic [place] O2 at home  Goals of Care: DNR, poA -Patient is DNR -Her sister prefers to focus on comfort measures rather than aggressive measures -At this time, it does not appear that she will be pursuing HD  -Palliative care consultation done and will continue Treatment and have palliative follow at Facility   Discharge Instructions Discharge Instructions    Call MD for:  difficulty breathing, headache or visual disturbances   Complete by: As directed    Call MD for:  extreme fatigue   Complete by: As directed    Call MD for:  hives   Complete by: As directed    Call MD for:  persistant dizziness or light-headedness   Complete by: As directed    Call MD for:   persistant nausea and vomiting   Complete by: As directed    Call MD for:  redness, tenderness, or signs of infection (pain, swelling, redness, odor or green/yellow discharge around incision site)   Complete by: As directed    Call MD for:  severe uncontrolled pain   Complete by: As directed    Call MD for:  temperature >100.4   Complete by: As directed    Diet - low sodium heart healthy   Complete by: As directed    Diet Carb Modified   Complete by: As directed    Discharge instructions   Complete by: As directed    You were cared for by a hospitalist during your hospital stay. If you have any questions about your discharge medications  or the care you received while you were in the hospital after you are discharged, you can call the unit and ask to speak with the hospitalist on call if the hospitalist that took care of you is not available. Once you are discharged, your primary care physician will handle any further medical issues. Please note that NO REFILLS for any discharge medications will be authorized once you are discharged, as it is imperative that you return to your primary care physician (or establish a relationship with a primary care physician if you do not have one) for your aftercare needs so that they can reassess your need for medications and monitor your lab values.  Follow up with PCP, Cardiology, Neurology and Palliative Care in the outpatient setting. Take all medications as prescribed. If symptoms change or worsen please return to the ED for evaluation   Increase activity slowly   Complete by: As directed      Allergies as of 07/19/2020      Reactions   Penicillins Anaphylaxis   Did it involve swelling of the face/tongue/throat, SOB, or low BP? Yes Did it involve sudden or severe rash/hives, skin peeling, or any reaction on the inside of your mouth or nose? Unknown Did you need to seek medical attention at a hospital or doctor's office? Unknown When did it last  happen?Within past 10 years If all above answers are "NO", may proceed with cephalosporin use.   Chicken Allergy Other (See Comments)   unk on mar   Contrast Media [iodinated Diagnostic Agents] Other (See Comments)   unk on mar   Eggs Or Egg-derived Products Swelling   Patient is allergic to egg yolk, but can eat egg white with no problems   Milk-related Compounds Other (See Comments)   unk on mar   Nickel Other (See Comments)   unk on mar   Oatmeal Other (See Comments)   unk on mar   Other    Black beans, blueberry, cats, dogs, grass clippings   Vaccinium Angustifolium Hives   Wheat Bran Other (See Comments)   unk on mar      Medication List    STOP taking these medications   polyethylene glycol powder 17 GM/SCOOP powder Commonly known as: GLYCOLAX/MIRALAX Replaced by: polyethylene glycol 17 g packet   senna 8.6 MG Tabs tablet Commonly known as: SENOKOT     TAKE these medications   acetaminophen 325 MG tablet Commonly known as: TYLENOL Take 2 tablets (650 mg total) by mouth every 6 (six) hours as needed for mild pain (or Fever >/= 101).   aspirin EC 81 MG tablet Take 1 tablet (81 mg total) by mouth 2 (two) times daily.   atorvastatin 20 MG tablet Commonly known as: LIPITOR Take 20 mg by mouth at bedtime.   camphor-menthol lotion Commonly known as: SARNA Apply 1 application topically every 8 (eight) hours as needed for itching.   cetirizine 10 MG tablet Commonly known as: ZYRTEC Take 10 mg by mouth at bedtime.   EPINEPHrine 0.3 mg/0.3 mL Soaj injection Commonly known as: EPI-PEN Inject 0.3 mg into the muscle daily as needed for anaphylaxis.   escitalopram 10 MG tablet Commonly known as: LEXAPRO Take 10 mg by mouth every morning.   feeding supplement (NEPRO CARB STEADY) Liqd Take 237 mLs by mouth 3 (three) times daily as needed (Supplement).   fludrocortisone 0.1 MG tablet Commonly known as: FLORINEF Take 0.2 mg by mouth daily.   hydrALAZINE 25 MG  tablet Commonly known as:  APRESOLINE Take 1 tablet (25 mg total) by mouth 3 (three) times daily.   hydrOXYzine 25 MG tablet Commonly known as: ATARAX/VISTARIL Take 1 tablet (25 mg total) by mouth every 8 (eight) hours as needed for itching.   insulin glargine 100 UNIT/ML injection Commonly known as: LANTUS Inject 0.1 mLs (10 Units total) into the skin at bedtime.   insulin lispro 100 UNIT/ML injection Commonly known as: HUMALOG Inject 4-12 Units into the skin 3 (three) times daily before meals. Per sliding scale 180-220= 4 units 221-260= 6 units 261-300= 8 units 301-340=10units 341-380=12units   linagliptin 5 MG Tabs tablet Commonly known as: TRADJENTA Take 5 mg by mouth every morning.   melatonin 5 MG Tabs Take 5 mg by mouth at bedtime.   metoprolol succinate 50 MG 24 hr tablet Commonly known as: TOPROL-XL Take 1 tablet (50 mg total) by mouth every morning. What changed:   medication strength  how much to take   midodrine 5 MG tablet Commonly known as: PROAMATINE Take 1 tablet (5 mg total) by mouth 3 (three) times daily with meals. What changed:   medication strength  how much to take   multivitamin with minerals Tabs tablet Take 1 tablet by mouth daily.   ondansetron 4 MG tablet Commonly known as: ZOFRAN Take 1 tablet (4 mg total) by mouth every 6 (six) hours as needed for nausea.   oxyCODONE 5 MG immediate release tablet Commonly known as: Oxy IR/ROXICODONE Take 1 tablet (5 mg total) by mouth every 4 (four) hours as needed for severe pain.   OXYGEN Inhale 2 L/min into the lungs continuous as needed (shortness of breath/desaturation).   polyethylene glycol 17 g packet Commonly known as: MIRALAX / GLYCOLAX Take 17 g by mouth 2 (two) times daily. Replaces: polyethylene glycol powder 17 GM/SCOOP powder   potassium chloride SA 20 MEQ tablet Commonly known as: KLOR-CON Take 1 tablet (20 mEq total) by mouth daily. What changed: how much to take    pregabalin 75 MG capsule Commonly known as: LYRICA Take 1 capsule (75 mg total) by mouth 2 (two) times daily for 10 doses.   QUEtiapine 25 MG tablet Commonly known as: SEROQUEL Take 1 tablet (25 mg total) by mouth at bedtime.   senna-docusate 8.6-50 MG tablet Commonly known as: Senokot-S Take 1 tablet by mouth 2 (two) times daily.   sorbitol 70 % Soln Take 30 mLs by mouth as needed for moderate constipation.   torsemide 20 MG tablet Commonly known as: DEMADEX Take 3 tablets (60 mg total) by mouth daily. Start 8/15 What changed:   when to take this  additional instructions       Allergies  Allergen Reactions  . Penicillins Anaphylaxis    Did it involve swelling of the face/tongue/throat, SOB, or low BP? Yes Did it involve sudden or severe rash/hives, skin peeling, or any reaction on the inside of your mouth or nose? Unknown Did you need to seek medical attention at a hospital or doctor's office? Unknown When did it last happen?Within past 10 years If all above answers are "NO", may proceed with cephalosporin use.   . Chicken Allergy Other (See Comments)    unk on mar  . Contrast Media [Iodinated Diagnostic Agents] Other (See Comments)    unk on mar  . Eggs Or Egg-Derived Products Swelling    Patient is allergic to egg yolk, but can eat egg white with no problems  . Milk-Related Compounds Other (See Comments)    unk on  mar  . Nickel Other (See Comments)    unk on mar  . Oatmeal Other (See Comments)    unk on mar  . Other     Black beans, blueberry, cats, dogs, grass clippings  . Vaccinium Angustifolium Hives  . Wheat Bran Other (See Comments)    unk on mar   Consultations:  Palliative Care Medicine  Procedures/Studies: CT ABDOMEN PELVIS WO CONTRAST  Result Date: 07/15/2020 CLINICAL DATA:  Abdominal distension. EXAM: CT ABDOMEN AND PELVIS WITHOUT CONTRAST TECHNIQUE: Multidetector CT imaging of the abdomen and pelvis was performed following the standard  protocol without IV contrast. COMPARISON:  CT lumbar spine 09/05/2019 FINDINGS: Lower chest: Minimal linear scarring/atelectasis left base. Mild cardiomegaly. Calcified plaque over the left anterior descending and lateral circumflex coronary arteries. Calcified plaque over the thoracic aorta. Possible 1.5 cm right infrahilar lymph node. Hepatobiliary: Previous cholecystectomy. Liver and biliary tree are normal. Pancreas: Normal. Spleen: Normal. Adrenals/Urinary Tract: Adrenal glands are normal. Kidneys are normal size without hydronephrosis or nephrolithiasis. Ureters are normal. Bladder is mildly distended. Stomach/Bowel: Stomach and small bowel are normal. Appendix is normal. Moderate air and stool throughout the colon with moderate fecal retention over the sigmoid colon. There is a redundant sigmoid colon. Minimal wall thickening of the rectosigmoid colon which could be seen with mild stercoral colitis. Minimal diverticulosis of the colon. Vascular/Lymphatic: Mild calcified plaque over the abdominal aorta which is normal in caliber. Few small nonspecific periaortic lymph nodes. Reproductive: Normal. Uterus and ovaries displaced anteriorly and superiorly by the air and stool filled rectosigmoid colon Other: No free peritoneal fluid or free peritoneal air. No focal inflammatory change. Tiny umbilical hernia containing only peritoneal fat. Musculoskeletal: Degenerative changes of the spine. Mild stable compression fracture of L2. IMPRESSION: 1. Minimal wall thickening of the rectosigmoid colon with associated moderate fecal retention. Findings may be due to mild stercoral colitis. Diverticulosis of the colon without evidence of diverticulitis. 2. Aortic Atherosclerosis (ICD10-I70.0). Atherosclerotic coronary artery disease. Mild cardiomegaly. 3.  Small umbilical hernia containing only peritoneal fat. 4.  Stable moderate L2 compression fracture. Electronically Signed   By: Marin Olp M.D.   On: 07/15/2020 14:30    DG Abd 1 View  Result Date: 07/18/2020 CLINICAL DATA:  Constipation. EXAM: ABDOMEN - 1 VIEW COMPARISON:  07/16/2020 FINDINGS: Large amount of stool is seen throughout colon. Decreased colonic distention is seen compared to prior study. No evidence of dilated small bowel loops. IMPRESSION: large amount of stool throughout colon, but decreased colonic distention compared to prior study. Electronically Signed   By: Marlaine Hind M.D.   On: 07/18/2020 10:01   CT Head Wo Contrast  Result Date: 07/15/2020 CLINICAL DATA:  Syncopal episode EXAM: CT HEAD WITHOUT CONTRAST TECHNIQUE: Contiguous axial images were obtained from the base of the skull through the vertex without intravenous contrast. COMPARISON:  06/11/2020 FINDINGS: Brain: There is no acute intracranial hemorrhage, mass effect, or edema. Gray-white differentiation is preserved. There is no extra-axial fluid collection. Ventricles and sulci are stable in size and configuration. Chronic infarcts of the basal ganglia and adjacent white matter bilaterally. Additional patchy hypoattenuation in the supratentorial white matter is nonspecific but may reflect mild chronic microvascular ischemic changes similar to the prior study. Vascular: There is atherosclerotic calcification at the skull base. Skull: Calvarium is unremarkable. Sinuses/Orbits: No acute finding. Other: None. IMPRESSION: No acute intracranial abnormality. Stable chronic findings detailed above. Electronically Signed   By: Macy Mis M.D.   On: 07/15/2020 11:53   DG Abd  Acute W/Chest  Result Date: 07/16/2020 CLINICAL DATA:  Abdominal distension. EXAM: DG ABDOMEN ACUTE W/ 1V CHEST COMPARISON:  CT abdomen pelvis from yesterday. FINDINGS: Unchanged cardiomegaly. Normal pulmonary vascularity. No focal consolidation, pleural effusion, or pneumothorax. No acute osseous abnormality. 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 There  is no evidence of dilated bowel loops or free intraperitoneal air. Large amount of stool throughout the colon. No radiopaque calculi or other significant radiographic abnormality is seen. IMPRESSION: 1. Prominent stool throughout the colon. Correlate for constipation. 2. No acute cardiopulmonary disease. Electronically Signed   By: Titus Dubin M.D.   On: 07/16/2020 10:03   DG Abdomen Acute W/Chest  Result Date: 07/15/2020 CLINICAL DATA:  Abdominal distension. EXAM: DG ABDOMEN ACUTE W/ 1V CHEST COMPARISON:  Chest x-ray 06/11/2020 FINDINGS: The heart is enlarged but stable. Mild central vascular congestion but no edema, infiltrates or effusions. Stable eventration of the right hemidiaphragm. The bony thorax is intact. Large amount of stool and air throughout the colon and down into the rectum suggesting constipation and fecal impaction. No distended small bowel loops to suggest obstruction. No free air. No worrisome calcifications. The bony structures are intact. IMPRESSION: 1. Stable cardiac enlargement and mild central vascular congestion. 2. Large amount of stool and air throughout the colon suggesting constipation and fecal impaction. 3. No findings for small bowel obstruction or free air. Electronically Signed   By: Marijo Sanes M.D.   On: 07/15/2020 11:33    Subjective: Seen and examined at bedside and she was sitting up in the chair eating food.  No nausea or vomiting.  Thinks she is doing okay.  No chest pain, lightheadedness or dizziness.  Continues to not remember certain things.  No other concerns or complaints at this time and she is stable to be discharged to skilled nursing facility to continue her rehab  Discharge Exam: Vitals:   07/18/20 2012 07/19/20 0442  BP: (!) 142/59 137/60  Pulse: 95 97  Resp: 18 18  Temp: 98.2 F (36.8 C) 98 F (36.7 C)  SpO2: 93% 94%   Vitals:   07/18/20 0553 07/18/20 1613 07/18/20 2012 07/19/20 0442  BP: 128/60 (!) 146/61 (!) 142/59 137/60  Pulse: 88  80 95 97  Resp: 18 20 18 18   Temp: 98.6 F (37 C) 98.5 F (36.9 C) 98.2 F (36.8 C) 98 F (36.7 C)  TempSrc: Oral Oral Oral Oral  SpO2: 95% 92% 93% 94%  Weight:      Height:       General: Pt is alert, awake, not in acute distress Cardiovascular: RRR, S1/S2 +, no rubs, no gallops Respiratory: Diminished bilaterally, no wheezing, no rhonchi Abdominal: Soft, NT, distended secondary body habitus, bowel sounds + Extremities: no edema, no cyanosis; left leg is in a cast  The results of significant diagnostics from this hospitalization (including imaging, microbiology, ancillary and laboratory) are listed below for reference.    Microbiology: Recent Results (from the past 240 hour(s))  SARS Coronavirus 2 by RT PCR (hospital order, performed in [facility] hospital lab) Nasopharyngeal Nasopharyngeal Swab     Status: None   Collection Time: 07/16/20  3:34 PM   Specimen: Nasopharyngeal Swab  Result Value Ref Range Status   SARS Coronavirus 2 NEGATIVE NEGATIVE Final    Comment: (NOTE) SARS-CoV-2 target nucleic acids are NOT DETECTED.  The SARS-CoV-2 RNA is generally detectable in upper and lower respiratory specimens during the acute phase of infection. The lowest concentration  of SARS-CoV-2 viral copies this assay can detect is 250 copies / mL. A negative result does not preclude SARS-CoV-2 infection and should not be used as the sole basis for treatment or other patient management decisions.  A negative result may occur with improper specimen collection / handling, submission of specimen other than nasopharyngeal swab, presence of viral mutation(s) within the areas targeted by this assay, and inadequate number of viral copies (<250 copies / mL). A negative result must be combined with clinical observations, patient history, and epidemiological information.  Fact Sheet for Patients:   StrictlyIdeas.no  Fact Sheet for Healthcare  Providers: BankingDealers.co.za  This test is not yet approved or  cleared by the [place] FDA and has been authorized for detection and/or diagnosis of SARS-CoV-2 by FDA under an Emergency Use Authorization (EUA).  This EUA will remain in effect (meaning this test can be used) for the duration of the COVID-19 declaration under Section 564(b)(1) of the Act, 21 U.S.C. section 360bbb-3(b)(1), unless the authorization is terminated or revoked sooner.  Performed at [facility], [place] [address] [place], [place] [address]   SARS Coronavirus 2 by RT PCR (hospital order, performed in [facility] hospital lab) Nasopharyngeal Nasopharyngeal Swab     Status: None   Collection Time: 07/18/20  4:55 PM   Specimen: Nasopharyngeal Swab  Result Value Ref Range Status   SARS Coronavirus 2 NEGATIVE NEGATIVE Final    Comment: (NOTE) SARS-CoV-2 target nucleic acids are NOT DETECTED.  The SARS-CoV-2 RNA is generally detectable in upper and lower respiratory specimens during the acute phase of infection. The lowest concentration of SARS-CoV-2 viral copies this assay can detect is 250 copies / mL. A negative result does not preclude SARS-CoV-2 infection and should not be used as the sole basis for treatment or other patient management decisions.  A negative result may occur with improper specimen collection / handling, submission of specimen other than nasopharyngeal swab, presence of viral mutation(s) within the areas targeted by this assay, and inadequate number of viral copies (<250 copies / mL). A negative result must be combined with clinical observations, patient history, and epidemiological information.  Fact Sheet for Patients:   StrictlyIdeas.no  Fact Sheet for Healthcare Providers: BankingDealers.co.za  This test is not yet approved or  cleared by the [place] FDA and has been authorized for detection  and/or diagnosis of SARS-CoV-2 by FDA under an Emergency Use Authorization (EUA).  This EUA will remain in effect (meaning this test can be used) for the duration of the COVID-19 declaration under Section 564(b)(1) of the Act, 21 U.S.C. section 360bbb-3(b)(1), unless the authorization is terminated or revoked sooner.  Performed at [facility], [place] [address] [place], [place] [address]     Labs: BNP (last 3 results) Recent Labs    07/20/19 1631 04/17/20 1039 04/22/20 0945  BNP 109.0* 650.0* 258.5*   Basic Metabolic Panel: Recent Labs  Lab 07/15/20 1213 07/16/20 0916 07/17/20 0113 07/18/20 0251 07/19/20 0318  NA 138 141 144 140 139  K 4.4 4.1 3.6 3.0* 3.8  CL 97* 98 101 101 101  CO2 27 29 30 29 31   GLUCOSE 243* 152* 142* 117* 211*  BUN 42* 53* 51* 42* 34*  CREATININE 3.61* 4.07* 3.55* 2.91* 2.53*  CALCIUM 8.2* 8.4* 7.8* 7.8* 8.1*  MG  --   --   --  2.5* 2.3  PHOS  --   --   --  3.4 3.2   Liver Function  Tests: Recent Labs  Lab 07/15/20 1213 07/16/20 0916 07/18/20 0251 07/19/20 0318  AST 15 16 12* 14*  ALT 12 12 10 10   ALKPHOS 83 91 64 67  BILITOT 0.7 0.9 0.2* 0.4  PROT 5.7* 6.2* 4.5* 4.9*  ALBUMIN 2.9* 2.9* 2.2* 2.2*   Recent Labs  Lab 07/15/20 1213 07/16/20 0916  LIPASE 27 22   No results for input(s): AMMONIA in the last 168 hours. CBC: Recent Labs  Lab 07/15/20 1213 07/16/20 0916 07/17/20 0113 07/18/20 0251 07/19/20 0318  WBC 14.6* 9.5 7.5 6.9 7.7  NEUTROABS 10.2* 5.2  --  2.9 3.4  HGB 9.5* 9.8* 8.5* 7.6* 8.5*  HCT 31.0* 32.1* 27.2* 24.2* 26.3*  MCV 86.4 87.9 86.6 85.5 84.6  PLT 168 190 152 156 171   Cardiac Enzymes: No results for input(s): CKTOTAL, CKMB, CKMBINDEX, TROPONINI in the last 168 hours. BNP: Invalid input(s): POCBNP CBG: Recent Labs  Lab 07/18/20 1254 07/18/20 1751 07/18/20 2028 07/19/20 0806 07/19/20 1149  GLUCAP 220* 219* 223* 171* 211*   D-Dimer No results for input(s): DDIMER in the last 72 hours. Hgb  A1c No results for input(s): HGBA1C in the last 72 hours. Lipid Profile No results for input(s): CHOL, HDL, LDLCALC, TRIG, CHOLHDL, LDLDIRECT in the last 72 hours. Thyroid function studies No results for input(s): TSH, T4TOTAL, T3FREE, THYROIDAB in the last 72 hours.  Invalid input(s): FREET3 Anemia work up No results for input(s): VITAMINB12, FOLATE, FERRITIN, TIBC, IRON, RETICCTPCT in the last 72 hours. Urinalysis    Component Value Date/Time   COLORURINE YELLOW 07/16/2020 1438   APPEARANCEUR HAZY (A) 07/16/2020 1438   LABSPEC 1.010 07/16/2020 1438   PHURINE 5.0 07/16/2020 1438   GLUCOSEU 50 (A) 07/16/2020 1438   HGBUR NEGATIVE 07/16/2020 1438   [place] 07/16/2020 1438   KETONESUR NEGATIVE 07/16/2020 1438   PROTEINUR 100 (A) 07/16/2020 1438   NITRITE NEGATIVE 07/16/2020 1438   LEUKOCYTESUR NEGATIVE 07/16/2020 1438   Sepsis Labs Invalid input(s): PROCALCITONIN,  WBC,  LACTICIDVEN Microbiology Recent Results (from the past 240 hour(s))  SARS Coronavirus 2 by RT PCR (hospital order, performed in [facility] hospital lab) Nasopharyngeal Nasopharyngeal Swab     Status: None   Collection Time: 07/16/20  3:34 PM   Specimen: Nasopharyngeal Swab  Result Value Ref Range Status   SARS Coronavirus 2 NEGATIVE NEGATIVE Final    Comment: (NOTE) SARS-CoV-2 target nucleic acids are NOT DETECTED.  The SARS-CoV-2 RNA is generally detectable in upper and lower respiratory specimens during the acute phase of infection. The lowest concentration of SARS-CoV-2 viral copies this assay can detect is 250 copies / mL. A negative result does not preclude SARS-CoV-2 infection and should not be used as the sole basis for treatment or other patient management decisions.  A negative result may occur with improper specimen collection / handling, submission of specimen other than nasopharyngeal swab, presence of viral mutation(s) within the areas targeted by this assay, and inadequate number  of viral copies (<250 copies / mL). A negative result must be combined with clinical observations, patient history, and epidemiological information.  Fact Sheet for Patients:   StrictlyIdeas.no  Fact Sheet for Healthcare Providers: BankingDealers.co.za  This test is not yet approved or  cleared by the [place] FDA and has been authorized for detection and/or diagnosis of SARS-CoV-2 by FDA under an Emergency Use Authorization (EUA).  This EUA will remain in effect (meaning this test can be used) for the duration of the COVID-19 declaration under Section  564(b)(1) of the Act, 21 U.S.C. section 360bbb-3(b)(1), unless the authorization is terminated or revoked sooner.  Performed at [facility], [place] [address], [place], [place] [address]   SARS Coronavirus 2 by RT PCR (hospital order, performed in [facility] hospital lab) Nasopharyngeal Nasopharyngeal Swab     Status: None   Collection Time: 07/18/20  4:55 PM   Specimen: Nasopharyngeal Swab  Result Value Ref Range Status   SARS Coronavirus 2 NEGATIVE NEGATIVE Final    Comment: (NOTE) SARS-CoV-2 target nucleic acids are NOT DETECTED.  The SARS-CoV-2 RNA is generally detectable in upper and lower respiratory specimens during the acute phase of infection. The lowest concentration of SARS-CoV-2 viral copies this assay can detect is 250 copies / mL. A negative result does not preclude SARS-CoV-2 infection and should not be used as the sole basis for treatment or other patient management decisions.  A negative result may occur with improper specimen collection / handling, submission of specimen other than nasopharyngeal swab, presence of viral mutation(s) within the areas targeted by this assay, and inadequate number of viral copies (<250 copies / mL). A negative result must be combined with clinical observations, patient history, and epidemiological information.  Fact Sheet for  Patients:   StrictlyIdeas.no  Fact Sheet for Healthcare Providers: BankingDealers.co.za  This test is not yet approved or  cleared by the [place] FDA and has been authorized for detection and/or diagnosis of SARS-CoV-2 by FDA under an Emergency Use Authorization (EUA).  This EUA will remain in effect (meaning this test can be used) for the duration of the COVID-19 declaration under Section 564(b)(1) of the Act, 21 U.S.C. section 360bbb-3(b)(1), unless the authorization is terminated or revoked sooner.  Performed at [facility], [place] [address] [place], [place] [address]    Time coordinating discharge: 35 minutes  SIGNED:  Kerney Elbe, DO [facility] 07/19/2020, 12:17 PM Pager is on [place]  If 7PM-7AM, please contact night-coverage [email or web address]

## 2020-07-19 NOTE — TOC Transition Note (Addendum)
Transition of Care [facility]) - CM/SW Discharge Note   Patient Details  Name: Victoria Winters MRN: [id number] Date of Birth: 10-31-1951  [facility] CM/SW Contact:  Alexander Mt, LCSW Phone Number: 07/19/2020, 11:19 AM   Clinical Narrative:    1:07pm- all paperwork completed, pt sister aware, signed scripts on chart also. RN Remo Lipps aware to call report. PTAR scheduled for 2pm.  11:19am-Plan for return to [facility], new COVID test is negative. Await clearance for d/c today if medically stable. DNR signed on chart along with PTAR papers.    Final next level of care: Skilled Nursing Facility Barriers to Discharge: Barriers Resolved   Patient Goals and CMS Choice Patient states their goals for this hospitalization and ongoing recovery are:: return to [facility]; LTC SNF placement CMS Medicare.gov Compare Post Acute Care list provided to:: Patient Represenative (must comment) (sister prefers return to [facility] where she has been residing) Choice offered to / list presented to : Sibling  Discharge Placement Existing PASRR number confirmed : 07/18/20          Patient chooses bed at: [facility] Patient to be transferred to facility by: [place] Name of family member notified: pt sister [name] Patient and family notified of of transfer: 07/19/20  Discharge Plan and Services In-house Referral: Clinical Social Work Discharge Planning Services: CM Consult Post Acute Care Choice: [place], Resumption of Svcs/PTA Provider           Readmission Risk Interventions Readmission Risk Prevention Plan 07/18/2020 06/12/2020 04/23/2020  Transportation Screening Complete Complete Complete  PCP or Specialist Appt within 3-5 Days - - Complete  Not Complete comments - - -  Medication Review (RN CM) - - -  [place] or [place] - - Complete  HRI or Home Care Consult comments - - -  Social Work Consult for [place] Planning/Counseling - - Complete   Palliative Care Screening - - Not Complete  Medication Review Press photographer) Referral to Pharmacy Complete Complete  PCP or Specialist appointment within 3-5 days of discharge Not Complete Complete -  PCP/Specialist Appt Not Complete comments LTC SNF - -  [place] or Home Care Consult Not Complete Complete -  [place] or Home Care Consult Pt Refusal Comments LTC SNF - -  SW Recovery Care/Counseling Consult Complete Complete -  Palliative Care Screening Complete Complete -  Skilled Nursing Facility Complete Complete -

## 2020-07-19 NOTE — Social Work (Signed)
Clinical Social Worker facilitated patient discharge including contacting patient family and facility to confirm patient discharge plans.  Clinical information faxed to facility and family agreeable with plan.  CSW arranged ambulance transport via PTAR to [place] RN to call [phone]  with report prior to discharge.  Clinical Social Worker will sign off for now as social work intervention is no longer needed. Please consult [place] again if new need arises.  Westley Hummer, MSW, LCSW Clinical Social Worker

## 2020-07-30 ENCOUNTER — Other Ambulatory Visit: Payer: Self-pay

## 2020-07-30 ENCOUNTER — Non-Acute Institutional Stay: Payer: Medicare Other | Admitting: Hospice

## 2020-07-30 DIAGNOSIS — F0391 Unspecified dementia with behavioral disturbance: Secondary | ICD-10-CM

## 2020-07-30 DIAGNOSIS — Z515 Encounter for palliative care: Secondary | ICD-10-CM
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
## 2020-08-18 ENCOUNTER — Emergency Department (HOSPITAL_COMMUNITY): Payer: Medicare Other

## 2020-08-18 ENCOUNTER — Encounter (HOSPITAL_COMMUNITY): Payer: Self-pay

## 2020-08-18 ENCOUNTER — Inpatient Hospital Stay (HOSPITAL_COMMUNITY)
Admission: EM | Admit: 2020-08-18 | Discharge: 2020-08-19 | DRG: 091 | Disposition: A | Discharge status: Hospice | Payer: Medicare Other | Source: Skilled Nursing Facility | Attending: Internal Medicine | Admitting: Internal Medicine

## 2020-08-18 DIAGNOSIS — Z9049 Acquired absence of other specified parts of digestive tract: Secondary | ICD-10-CM | POA: Diagnosis not present

## 2020-08-18 DIAGNOSIS — Z888 Allergy status to other drugs, medicaments and biological substances status: Secondary | ICD-10-CM

## 2020-08-18 DIAGNOSIS — R32 Unspecified urinary incontinence: Secondary | ICD-10-CM | POA: Diagnosis present

## 2020-08-18 DIAGNOSIS — D631 Anemia in chronic kidney disease: Secondary | ICD-10-CM | POA: Diagnosis not present

## 2020-08-18 DIAGNOSIS — J9611 Chronic respiratory failure with hypoxia: Secondary | ICD-10-CM | POA: Insufficient documentation

## 2020-08-18 DIAGNOSIS — R000 Tachycardia, unspecified: Secondary | ICD-10-CM | POA: Diagnosis present

## 2020-08-18 DIAGNOSIS — E2740 Unspecified adrenocortical insufficiency: Secondary | ICD-10-CM | POA: Diagnosis present

## 2020-08-18 DIAGNOSIS — Z8673 Personal history of transient ischemic attack (TIA), and cerebral infarction without residual deficits: Secondary | ICD-10-CM | POA: Diagnosis not present

## 2020-08-18 DIAGNOSIS — E785 Hyperlipidemia, unspecified: Secondary | ICD-10-CM | POA: Diagnosis present

## 2020-08-18 DIAGNOSIS — I132 Hypertensive heart and chronic kidney disease with heart failure and with stage 5 chronic kidney disease, or end stage renal disease: Secondary | ICD-10-CM | POA: Diagnosis present

## 2020-08-18 DIAGNOSIS — E1165 Type 2 diabetes mellitus with hyperglycemia: Secondary | ICD-10-CM | POA: Diagnosis not present

## 2020-08-18 DIAGNOSIS — Z66 Do not resuscitate: Secondary | ICD-10-CM | POA: Diagnosis present

## 2020-08-18 DIAGNOSIS — Z789 Other specified health status: Secondary | ICD-10-CM | POA: Diagnosis not present

## 2020-08-18 DIAGNOSIS — G9340 Encephalopathy, unspecified: Secondary | ICD-10-CM | POA: Diagnosis present

## 2020-08-18 DIAGNOSIS — F01518 Vascular dementia, unspecified severity, with other behavioral disturbance: Secondary | ICD-10-CM | POA: Diagnosis present

## 2020-08-18 DIAGNOSIS — Z7189 Other specified counseling: Secondary | ICD-10-CM | POA: Diagnosis not present

## 2020-08-18 DIAGNOSIS — Z91018 Allergy to other foods: Secondary | ICD-10-CM

## 2020-08-18 DIAGNOSIS — I639 Cerebral infarction, unspecified: Secondary | ICD-10-CM

## 2020-08-18 DIAGNOSIS — N189 Chronic kidney disease, unspecified: Secondary | ICD-10-CM | POA: Diagnosis present

## 2020-08-18 DIAGNOSIS — Z9981 Dependence on supplemental oxygen: Secondary | ICD-10-CM | POA: Diagnosis not present

## 2020-08-18 DIAGNOSIS — Z87891 Personal history of nicotine dependence: Secondary | ICD-10-CM | POA: Diagnosis not present

## 2020-08-18 DIAGNOSIS — J690 Pneumonitis due to inhalation of food and vomit: Secondary | ICD-10-CM | POA: Diagnosis present

## 2020-08-18 DIAGNOSIS — Z79899 Other long term (current) drug therapy: Secondary | ICD-10-CM

## 2020-08-18 DIAGNOSIS — J9621 Acute and chronic respiratory failure with hypoxia: Secondary | ICD-10-CM | POA: Diagnosis present

## 2020-08-18 DIAGNOSIS — G92 Toxic encephalopathy: Secondary | ICD-10-CM | POA: Diagnosis present

## 2020-08-18 DIAGNOSIS — R159 Full incontinence of feces: Secondary | ICD-10-CM | POA: Diagnosis present

## 2020-08-18 DIAGNOSIS — F0151 Vascular dementia with behavioral disturbance: Secondary | ICD-10-CM | POA: Diagnosis not present

## 2020-08-18 DIAGNOSIS — N185 Chronic kidney disease, stage 5: Secondary | ICD-10-CM | POA: Diagnosis not present

## 2020-08-18 DIAGNOSIS — N186 End stage renal disease: Secondary | ICD-10-CM | POA: Diagnosis present

## 2020-08-18 DIAGNOSIS — N184 Chronic kidney disease, stage 4 (severe): Secondary | ICD-10-CM

## 2020-08-18 DIAGNOSIS — E1122 Type 2 diabetes mellitus with diabetic chronic kidney disease: Secondary | ICD-10-CM | POA: Diagnosis present

## 2020-08-18 DIAGNOSIS — R4182 Altered mental status, unspecified: Secondary | ICD-10-CM

## 2020-08-18 DIAGNOSIS — Z880 Allergy status to penicillin: Secondary | ICD-10-CM

## 2020-08-18 DIAGNOSIS — R569 Unspecified convulsions: Secondary | ICD-10-CM | POA: Diagnosis present

## 2020-08-18 DIAGNOSIS — I5032 Chronic diastolic (congestive) heart failure: Secondary | ICD-10-CM | POA: Diagnosis present

## 2020-08-18 DIAGNOSIS — Z515 Encounter for palliative care: Secondary | ICD-10-CM | POA: Diagnosis not present

## 2020-08-18 DIAGNOSIS — Z833 Family history of diabetes mellitus: Secondary | ICD-10-CM

## 2020-08-18 DIAGNOSIS — G8929 Other chronic pain: Secondary | ICD-10-CM | POA: Diagnosis present

## 2020-08-18 DIAGNOSIS — Z6841 Body Mass Index (BMI) 40.0 and over, adult: Secondary | ICD-10-CM | POA: Diagnosis not present

## 2020-08-18 DIAGNOSIS — E669 Obesity, unspecified: Secondary | ICD-10-CM | POA: Diagnosis present

## 2020-08-18 DIAGNOSIS — Z8249 Family history of ischemic heart disease and other diseases of the circulatory system: Secondary | ICD-10-CM

## 2020-08-18 DIAGNOSIS — G9341 Metabolic encephalopathy: Secondary | ICD-10-CM | POA: Diagnosis present

## 2020-08-18 DIAGNOSIS — Z7982 Long term (current) use of aspirin: Secondary | ICD-10-CM

## 2020-08-18 DIAGNOSIS — Z91011 Allergy to milk products: Secondary | ICD-10-CM

## 2020-08-18 DIAGNOSIS — Z7401 Bed confinement status: Secondary | ICD-10-CM

## 2020-08-18 DIAGNOSIS — Z20822 Contact with and (suspected) exposure to covid-19: Secondary | ICD-10-CM | POA: Diagnosis present

## 2020-08-18 DIAGNOSIS — Z7952 Long term (current) use of systemic steroids: Secondary | ICD-10-CM

## 2020-08-18 DIAGNOSIS — Z794 Long term (current) use of insulin: Secondary | ICD-10-CM

## 2020-08-18 DIAGNOSIS — I671 Cerebral aneurysm, nonruptured: Secondary | ICD-10-CM | POA: Diagnosis present

## 2020-08-18 DIAGNOSIS — E119 Type 2 diabetes mellitus without complications: Secondary | ICD-10-CM

## 2020-08-18 DIAGNOSIS — R29724 NIHSS score 24: Secondary | ICD-10-CM | POA: Diagnosis present

## 2020-08-18 DIAGNOSIS — F05 Delirium due to known physiological condition: Secondary | ICD-10-CM | POA: Diagnosis present

## 2020-08-18 DIAGNOSIS — Z91012 Allergy to eggs: Secondary | ICD-10-CM

## 2020-08-18 DIAGNOSIS — R278 Other lack of coordination: Secondary | ICD-10-CM | POA: Diagnosis present

## 2020-08-18 LAB — I-STAT VENOUS BLOOD GAS, ED
Acid-Base Excess: 3 mmol/L — ABNORMAL HIGH (ref 0.0–2.0)
Bicarbonate: 27.7 mmol/L (ref 20.0–28.0)
Calcium, Ion: 1.05 mmol/L — ABNORMAL LOW (ref 1.15–1.40)
HCT: 28 % — ABNORMAL LOW (ref 36.0–46.0)
Hemoglobin: 9.5 g/dL — ABNORMAL LOW (ref 12.0–15.0)
O2 Saturation: 91 %
Potassium: 4.2 mmol/L (ref 3.5–5.1)
Sodium: 142 mmol/L (ref 135–145)
TCO2: 29 mmol/L (ref 22–32)
pCO2, Ven: 43.8 mmHg — ABNORMAL LOW (ref 44.0–60.0)
pH, Ven: 7.409 (ref 7.250–7.430)
pO2, Ven: 62 mmHg — ABNORMAL HIGH (ref 32.0–45.0)

## 2020-08-18 LAB — PROTIME-INR
INR: 1 (ref 0.8–1.2)
Prothrombin Time: 12.4 seconds (ref 11.4–15.2)

## 2020-08-18 LAB — I-STAT CHEM 8, ED
BUN: 20 mg/dL (ref 8–23)
Calcium, Ion: 1.07 mmol/L — ABNORMAL LOW (ref 1.15–1.40)
Chloride: 103 mmol/L (ref 98–111)
Creatinine, Ser: 2.2 mg/dL — ABNORMAL HIGH (ref 0.44–1.00)
Glucose, Bld: 335 mg/dL — ABNORMAL HIGH (ref 70–99)
HCT: 29 % — ABNORMAL LOW (ref 36.0–46.0)
Hemoglobin: 9.9 g/dL — ABNORMAL LOW (ref 12.0–15.0)
Potassium: 4.1 mmol/L (ref 3.5–5.1)
Sodium: 142 mmol/L (ref 135–145)
TCO2: 25 mmol/L (ref 22–32)

## 2020-08-18 LAB — CBC
HCT: 30.3 % — ABNORMAL LOW (ref 36.0–46.0)
Hemoglobin: 9.1 g/dL — ABNORMAL LOW (ref 12.0–15.0)
MCH: 25.9 pg — ABNORMAL LOW (ref 26.0–34.0)
MCHC: 30 g/dL (ref 30.0–36.0)
MCV: 86.1 fL (ref 80.0–100.0)
Platelets: 264 10*3/uL (ref 150–400)
RBC: 3.52 MIL/uL — ABNORMAL LOW (ref 3.87–5.11)
RDW: 16 % — ABNORMAL HIGH (ref 11.5–15.5)
WBC: 12 10*3/uL — ABNORMAL HIGH (ref 4.0–10.5)
nRBC: 0 % (ref 0.0–0.2)

## 2020-08-18 LAB — COMPREHENSIVE METABOLIC PANEL
ALT: 11 U/L (ref 0–44)
AST: 20 U/L (ref 15–41)
Albumin: 2.5 g/dL — ABNORMAL LOW (ref 3.5–5.0)
Alkaline Phosphatase: 84 U/L (ref 38–126)
Anion gap: 11 (ref 5–15)
BUN: 19 mg/dL (ref 8–23)
CO2: 26 mmol/L (ref 22–32)
Calcium: 8.1 mg/dL — ABNORMAL LOW (ref 8.9–10.3)
Chloride: 105 mmol/L (ref 98–111)
Creatinine, Ser: 2.42 mg/dL — ABNORMAL HIGH (ref 0.44–1.00)
GFR calc Af Amer: 23 mL/min — ABNORMAL LOW (ref >60)
GFR calc non Af Amer: 20 mL/min — ABNORMAL LOW (ref >60)
Glucose, Bld: 340 mg/dL — ABNORMAL HIGH (ref 70–99)
Potassium: 4.2 mmol/L (ref 3.5–5.1)
Sodium: 142 mmol/L (ref 135–145)
Total Bilirubin: 0.6 mg/dL (ref 0.3–1.2)
Total Protein: 5.7 g/dL — ABNORMAL LOW (ref 6.5–8.1)

## 2020-08-18 LAB — SARS CORONAVIRUS 2 BY RT PCR (HOSPITAL ORDER, PERFORMED IN ~~LOC~~ HOSPITAL LAB): SARS Coronavirus 2: NEGATIVE

## 2020-08-18 LAB — DIFFERENTIAL
Abs Immature Granulocytes: 0.07 10*3/uL (ref 0.00–0.07)
Basophils Absolute: 0 10*3/uL (ref 0.0–0.1)
Basophils Relative: 0 %
Eosinophils Absolute: 0 10*3/uL (ref 0.0–0.5)
Eosinophils Relative: 0 %
Immature Granulocytes: 1 %
Lymphocytes Relative: 16 %
Lymphs Abs: 1.9 10*3/uL (ref 0.7–4.0)
Monocytes Absolute: 0.3 10*3/uL (ref 0.1–1.0)
Monocytes Relative: 3 %
Neutro Abs: 9.6 10*3/uL — ABNORMAL HIGH (ref 1.7–7.7)
Neutrophils Relative %: 80 %

## 2020-08-18 LAB — ETHANOL: Alcohol, Ethyl (B): <10 mg/dL (ref <10)

## 2020-08-18 LAB — APTT: aPTT: 27 seconds (ref 24–36)

## 2020-08-18 LAB — LACTIC ACID, PLASMA: Lactic Acid, Venous: 2.1 mmol/L (ref 0.5–1.9)

## 2020-08-18 MED ORDER — LORAZEPAM 1 MG PO TABS: 1.0000 mg | ORAL_TABLET | ORAL | Status: DC | PRN

## 2020-08-18 MED ORDER — SODIUM CHLORIDE 0.9 % IV SOLN
1.0000 g | INTRAVENOUS | Status: DC
  Filled 2020-08-18: qty 10

## 2020-08-18 MED ORDER — POLYVINYL ALCOHOL 1.4 % OP SOLN
1.0000 [drp] | Freq: Four times a day (QID) | OPHTHALMIC | Status: DC | PRN
  Filled 2020-08-18: qty 15

## 2020-08-18 MED ORDER — SODIUM CHLORIDE 0.9 % IV SOLN: Freq: Once | INTRAVENOUS | Status: DC

## 2020-08-18 MED ORDER — INSULIN ASPART 100 UNIT/ML ~~LOC~~ SOLN: 0.0000 [IU] | Freq: Every day | SUBCUTANEOUS | Status: DC

## 2020-08-18 MED ORDER — ACETAMINOPHEN 325 MG PO TABS: 650.0000 mg | ORAL_TABLET | Freq: Four times a day (QID) | ORAL | Status: DC | PRN

## 2020-08-18 MED ORDER — FENTANYL CITRATE (PF) 100 MCG/2ML IJ SOLN: 50.0000 ug | INTRAMUSCULAR | Status: DC | PRN

## 2020-08-18 MED ORDER — ONDANSETRON 4 MG PO TBDP: 4.0000 mg | ORAL_TABLET | Freq: Four times a day (QID) | ORAL | Status: DC | PRN

## 2020-08-18 MED ORDER — INSULIN ASPART 100 UNIT/ML ~~LOC~~ SOLN: 0.0000 [IU] | Freq: Three times a day (TID) | SUBCUTANEOUS | Status: DC

## 2020-08-18 MED ORDER — HALOPERIDOL LACTATE 2 MG/ML PO CONC
2.0000 mg | Freq: Four times a day (QID) | ORAL | Status: DC | PRN
  Filled 2020-08-18: qty 1

## 2020-08-18 MED ORDER — LORAZEPAM 2 MG/ML IJ SOLN
1.0000 mg | INTRAMUSCULAR | Status: DC | PRN
  Administered 2020-08-18: 1 mg via INTRAVENOUS
  Filled 2020-08-18: qty 1

## 2020-08-18 MED ORDER — LEVOFLOXACIN IN D5W 750 MG/150ML IV SOLN: 750.0000 mg | Freq: Once | INTRAVENOUS | Status: DC

## 2020-08-18 MED ORDER — ACETAMINOPHEN 650 MG RE SUPP: 650.0000 mg | Freq: Four times a day (QID) | RECTAL | Status: DC | PRN

## 2020-08-18 MED ORDER — GLYCOPYRROLATE 0.2 MG/ML IJ SOLN: 0.2000 mg | INTRAMUSCULAR | Status: DC | PRN

## 2020-08-18 MED ORDER — LORAZEPAM 2 MG/ML PO CONC: 1.0000 mg | ORAL | Status: DC | PRN

## 2020-08-18 MED ORDER — GLYCOPYRROLATE 1 MG PO TABS
1.0000 mg | ORAL_TABLET | ORAL | Status: DC | PRN
  Filled 2020-08-18: qty 1

## 2020-08-18 MED ORDER — HALOPERIDOL 1 MG PO TABS
2.0000 mg | ORAL_TABLET | Freq: Four times a day (QID) | ORAL | Status: DC | PRN
  Filled 2020-08-18: qty 2

## 2020-08-18 MED ORDER — SODIUM CHLORIDE 0.9 % IV SOLN: INTRAVENOUS | Status: DC

## 2020-08-18 MED ORDER — ONDANSETRON HCL 4 MG PO TABS: 4.0000 mg | ORAL_TABLET | Freq: Four times a day (QID) | ORAL | Status: DC | PRN

## 2020-08-18 MED ORDER — HEPARIN SODIUM (PORCINE) 5000 UNIT/ML IJ SOLN
5000.0000 [IU] | Freq: Three times a day (TID) | INTRAMUSCULAR | Status: DC
  Filled 2020-08-18: qty 1

## 2020-08-18 MED ORDER — HALOPERIDOL LACTATE 5 MG/ML IJ SOLN: 2.0000 mg | Freq: Four times a day (QID) | INTRAMUSCULAR | Status: DC | PRN

## 2020-08-18 MED ORDER — ONDANSETRON HCL 4 MG/2ML IJ SOLN: 4.0000 mg | Freq: Four times a day (QID) | INTRAMUSCULAR | Status: DC | PRN

## 2020-08-18 MED ORDER — ALBUTEROL SULFATE (2.5 MG/3ML) 0.083% IN NEBU: 2.5000 mg | INHALATION_SOLUTION | RESPIRATORY_TRACT | Status: DC | PRN

## 2020-08-18 MED ORDER — SODIUM CHLORIDE 0.9 % IV BOLUS: 1000.0000 mL | Freq: Once | INTRAVENOUS | Status: DC

## 2020-08-18 MED ORDER — HYDRALAZINE HCL 20 MG/ML IJ SOLN
10.0000 mg | Freq: Once | INTRAMUSCULAR | Status: AC
  Administered 2020-08-18: 10 mg via INTRAVENOUS
  Filled 2020-08-18: qty 1

## 2020-08-18 MED FILL — Medication: Qty: 1 | Status: AC

## 2020-08-18 NOTE — Progress Notes (Addendum)
Brief note: ( no charge note) Examined patient s/p seizure like activity. Per nursing before DNI was confirmed. 1 round of chest compressions was given for loss of pulse post seizure like activity. Patient on [place] 2L. sats > 93%  Neuro: Responds to voice, does not follow commands PERRL Moves all 4 extremities spontaneously Asterixis in BUE Localizes and withdraws to pain in BUE Grimaces to pain in BLE BP: 91/64  No seizure like activity noted during exam. Agree with previous neurology note to obtain MRI. Maybe add EEG Laurey Morale, MSN, NP-C [facility] [phone]    Attending addendum Patient seen and examined-had 30 seconds worth of seizure-like activity this morning. Before had one could be administered, activity had stopped and Ativan was not given Patient was drowsy, not following commands, antigravity in upper extremities with asterixis. I recommended getting an EEG as well as MRI along with checking ammonia and ABG. The hospitalist called as soon after, after having conversation with family-sister, who was decided to make the patient DNR and comfort measures only. I would defer the management to the primary team for initiation of comfort measures MRI and EEG orders can be canceled. Please call us if we can be of any other assistance Plan discussed with Dr. Doristine Bosworth  -- Amie [place], MD [facility] Pager: [phone] If 7pm to 7am, please call on call as listed on AMION.

## 2020-08-18 NOTE — Consult Note (Signed)
Consultation Note Date: 08/18/2020   Patient Name: Victoria Winters  DOB: Feb 21, 1951  MRN: [id number]  Age / Sex: 69 y.o., female  PCP: Zhou-Talbert, Elwyn Lade, MD Referring Physician: Mckinley Jewel, MD  Reason for Consultation: Disposition, Establishing goals of care, Non pain symptom management, Pain control, Psychosocial/spiritual support and Terminal Care  HPI/Patient Profile: 69 y.o. female  with past medical history of CKD stage IV, stroke, dementia, brain aneurism was seen in the ED on 08/18/20 for altered mental status and aspiration pneumonia of right lower lobe.   Per H&P--> ED Course: Upon arrival to ED: Patient tachycardic, blood pressure elevated, initially requiring nonrebreather mask, afebrile CBC shows leukocytosis of 12, normocytic anemia at baseline, CMP shows CKD stage IV at baseline, lactic acid: 2.1, ethanol, PT/INR: WNL, COVID-19, UA, UDS, blood culture: Pending.  Chest x-ray concerning for aspiration pneumonia, CT head concerning for PRES. EDP consulted neurology who recommended MRI.  Patient was given hydralazine 10 mg IV once-her blood pressure trended down in 17O systolic.  She was given IV fluid bolus after that.   [facility] hospitalist consulted for admission due to acute metabolic encephalopathy.  In ED: Patient had seizure-like activity-she was noted to be pulseless therefore 1 round of chest compressions performed in ED.  Her CODE STATUS was confirmed DNR/DNI.  Patient's subsequently had pulses.  Family did not want heroic measures which include ICU care. Neurology was consulted for AMS and seizure like activity.  Of note, the patient has had two recent admissions to the hospital:  7/6-06/14/20 for fracture of tibia and fibula post fall 8/9-8/13/21 for AKI on CKD - stated patient was not a candidate for dialysis at this time  Patient and family face treatment option decisions, advanced  directive decisions, and anticipatory care needs.  Clinical Assessment and Goals of Care: I have reviewed medical records including EPIC notes, labs, and imaging. Received report from primary RN - no acute concerns outside events in ED this morning.   Went to visit patient at bedside - no family/visitors present. Patient was lying in bed with her eyes closed, reaching out grabbing at the air. She did open her eyes to voice and gentle touch but remained nonverbal and was not able to participate in conversation. No signs or non-verbal gestures of pain or discomfort noted. No respiratory distress, increased work of breathing, or secretions noted.  Met with [name]/sister/HCPOA  to discuss diagnosis, prognosis, GOC, EOL wishes, disposition, and options.  I introduced Palliative Medicine as specialized medical care for people living with serious illness. It focuses on providing relief from the symptoms and stress of a serious illness. The goal is to improve quality of life for both the patient and the family.  We discussed a brief life review of the patient as well as functional and nutritional status. The patient has been divorced for many years and has 2 children, one daughter and one son. 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 The patient  lived on her own until 2016 when she had a brain aneurism. It was at this time the patient's health started to decline. The patient went to live with her daughter in [place], [place] from 2016-2019. In 2019, the patient went to live with [place] in [facility]. When the patient fell and broke her leg in July of this year, the patient then went to live at [facility] and has been wheelchair bound since that time. [name] reports the patient had been eating well until recently.   We discussed patient's current illness and what it means in the larger context of patient's on-going co-morbidities. [name] had a  clear understanding of the patient's current medical situation. Natural disease trajectory and expectations at EOL were discussed. I attempted to elicit values and goals of care important to the patient. The difference between aggressive medical intervention and comfort care was considered in light of the patient's goals of care. [name] stated that she felt the patient had "lived a good life" and been through a lot over the last few months. She was clear in stating that she wanted to focus on the patient's comfort at this time instead of using life prolonging measures. We talked about transition to comfort measures in house and what that would entail inclusive of medications to control pain, dyspnea, agitation, nausea, itching, and hiccups. We discussed stopping all uneccessary measures such as blood draws, needle sticks, oxygen, CBGs/insulin, cardiac monitoring, and frequent vital signs. I introduced and educated on hospice philosophy and services. [name] expressed understanding and was agreeable for transition to comfort measures and transfer to residential hospice.  Advance directives, concepts specific to code status, artificial feeding and hydration, and rehospitalization were considered and discussed. The patient has an active MOST form and DNR in place - [name] is respectful of the patient's wishes from these forms.   Discussed with [name] the importance of continued conversation with family the medical providers regarding overall plan of care and treatment options, ensuring decisions are within the context of the patient's values and GOCs.    Questions and concerns were addressed. The family was encouraged to call with questions or concerns. PMT number was offered.    Primary Decision Maker: HCPOA Willeen Cass - sister    SUMMARY OF RECOMMENDATIONS   Continue full comfort measures  Continue DNR/DNI as previously documented  HCPOA is agreeable for patient transfer to residential  hospice - TOC notified and consult placed  Transfer [facility] when bed becomes available  Patient was previously being followed by outpatient Palliative Care through [facility] liaison notified of patient's admission and now transition to comfort care  Added orders to ensure EOL comfort and to reflect full comfort measures, as well as discontinued orders that were not focused on comfort  Unrestricted visitation orders were placed per current [facility] EOL visitation policy   Provide frequent assessments and administer PRN medications as clinically necessary to ensure EOL comfort  PMT will continue to follow holistically  Code Status/Advance Care Planning:  DNR  Palliative Prophylaxis:   Aspiration, Bowel Regimen, Delirium Protocol, Frequent Pain Assessment, Oral Care and Turn Reposition  Additional Recommendations (Limitations, Scope, Preferences):  Full Comfort Care  Psycho-social/Spiritual:   Desire for further Chaplaincy support:no  Created space and opportunity for patient and family to express thoughts and feelings regarding patient's current medical situation.   Emotional support provided  Prognosis:   < 2 weeks  Discharge Planning: Hospice facility      Primary Diagnoses:  Present on Admission: . Anemia in chronic kidney disease (CKD) . CKD (chronic kidney disease) IV . Acute encephalopathy . Aspiration pneumonia ([place]) . Vascular dementia with behavior disturbance ([place]) . CVA (cerebral vascular accident) ([place]) . Acute and chronic respiratory failure with hypoxia ([place]) . Acute metabolic encephalopathy   I have reviewed the medical record, interviewed the patient and family, and examined the patient. The following aspects are pertinent.  Past Medical History:  Diagnosis Date  . Brain aneurysm   . Diabetes mellitus without complication ([place])   . Hallucinations    visual and auditory  . Memory loss   . Recurrent UTI   . Renal disorder     . Stage 5 chronic kidney disease ([place])   . Stroke [facility])    Social History   Socioeconomic History  . Marital status: Single    Spouse name: Not on file  . Number of children: Not on file  . Years of education: Not on file  . Highest education level: Not on file  Occupational History  . Not on file  Tobacco Use  . Smoking status: Former Research scientist (life sciences)  . Smokeless tobacco: Never Used  Vaping Use  . Vaping Use: Never used  Substance and Sexual Activity  . Alcohol use: Yes    Comment: rarely  . Drug use: Never  . Sexual activity: Not on file  Other Topics Concern  . Not on file  Social History Narrative  . Not on file   Social Determinants of Health   Financial Resource Strain:   . Difficulty of Paying Living Expenses: Not on file  Food Insecurity:   . Worried About Charity fundraiser in the Last Year: Not on file  . Ran Out of Food in the Last Year: Not on file  Transportation Needs:   . Lack of Transportation (Medical): Not on file  . Lack of Transportation (Non-Medical): Not on file  Physical Activity:   . Days of Exercise per Week: Not on file  . Minutes of Exercise per Session: Not on file  Stress:   . Feeling of Stress : Not on file  Social Connections:   . Frequency of Communication with Friends and Family: Not on file  . Frequency of Social Gatherings with Friends and Family: Not on file  . Attends Religious Services: Not on file  . Active Member of Clubs or Organizations: Not on file  . Attends Archivist Meetings: Not on file  . Marital Status: Not on file   Family History  Problem Relation Age of Onset  . Heart disease Mother   . Diabetes Mellitus II Mother   . Heart disease Father   . Diabetes Father   . Heart disease Brother   . Cancer Other    Scheduled Meds: . insulin aspart  0-5 Units Subcutaneous QHS  . insulin aspart  0-9 Units Subcutaneous TID WC   Continuous Infusions: PRN Meds:.acetaminophen **OR** acetaminophen, albuterol,  ondansetron **OR** ondansetron (ZOFRAN) IV Medications Prior to Admission:  Prior to Admission medications   Medication Sig Start Date End Date Taking? Authorizing Provider  acetaminophen (TYLENOL) 325 MG tablet Take 2 tablets (650 mg total) by mouth every 6 (six) hours as needed for mild pain (or Fever >/= 101). 07/19/20  Yes Sheikh, Omair Latif, DO  aspirin EC 81 MG tablet Take 1 tablet (81 mg total) by mouth 2 (two) times daily. 06/12/20  Yes Corky Sing, PA-C  atorvastatin (LIPITOR) 20 MG tablet Take 20 mg  by mouth at bedtime.  09/29/19  Yes [provider]  bisacodyl (DULCOLAX) 10 MG suppository Place 10 mg rectally at bedtime.   Yes [provider]  cetirizine (ZYRTEC) 10 MG tablet Take 10 mg by mouth at bedtime.    Yes [provider]  EPINEPHrine 0.3 mg/0.3 mL IJ SOAJ injection Inject 0.3 mg into the muscle daily as needed for anaphylaxis.   Yes [provider]  escitalopram (LEXAPRO) 10 MG tablet Take 10 mg by mouth every morning.   Yes [provider]  fludrocortisone (FLORINEF) 0.1 MG tablet Take 0.2 mg by mouth daily.  03/19/20  Yes [provider]  insulin glargine (LANTUS) 100 UNIT/ML injection Inject 0.1 mLs (10 Units total) into the skin at bedtime. 07/29/19  Yes Kathie Dike, MD  insulin lispro (HUMALOG) 100 UNIT/ML injection Inject 4-12 Units into the skin 3 (three) times daily before meals. Per sliding scale 180-220= 4 units 221-260= 6 units 261-300= 8 units 301-340=10units 341-380=12units   Yes [provider]  lactulose (CHRONULAC) 10 GM/15ML solution Take 20 g by mouth at bedtime.   Yes [provider]  linaclotide (LINZESS) 145 MCG CAPS capsule Take 145 mcg by mouth daily before breakfast.   Yes [provider]  linagliptin (TRADJENTA) 5 MG TABS tablet Take 5 mg by mouth every morning.   Yes [provider]  melatonin 5 MG TABS Take 5 mg by mouth at bedtime.    Yes [provider]  metoprolol succinate (TOPROL-XL) 50 MG 24 hr tablet Take 1 tablet (50 mg total) by mouth every morning. 07/19/20  Yes Sheikh, Omair Latif, DO  midodrine (PROAMATINE) 5 MG tablet Take 1 tablet (5 mg total) by mouth 3 (three) times daily with meals. 07/19/20  Yes Sheikh, Omair Latif, DO  Multiple Vitamin (MULTIVITAMIN WITH MINERALS) TABS tablet Take 1 tablet by mouth daily.   Yes [provider]  ondansetron (ZOFRAN) 4 MG tablet Take 1 tablet (4 mg total) by mouth every 6 (six) hours as needed for nausea. 07/19/20  Yes Sheikh, Omair Latif, DO  oxyCODONE (OXY IR/ROXICODONE) 5 MG immediate release tablet Take 1 tablet (5 mg total) by mouth every 4 (four) hours as needed for severe pain. 07/19/20  Yes Sheikh, Omair Latif, DO  OXYGEN Inhale 2 L/min into the lungs continuous as needed (shortness of breath/desaturation).   Yes [provider]  polyethylene glycol (MIRALAX / GLYCOLAX) 17 g packet Take 17 g by mouth 2 (two) times daily. 07/19/20  Yes Sheikh, Omair Latif, DO  potassium chloride SA (KLOR-CON) 20 MEQ tablet Take 1 tablet (20 mEq total) by mouth daily. 07/19/20  Yes Sheikh, Omair Latif, DO  pregabalin (LYRICA) 75 MG capsule Take 75 mg by mouth 2 (two) times daily.   Yes [provider]  QUEtiapine (SEROQUEL) 25 MG tablet Take 1 tablet (25 mg total) by mouth at bedtime. 07/29/19  Yes Kathie Dike, MD  Rectal Cleansers (FLEET NATURALS CLEANSING ENEMA RE) Place 1 application rectally at bedtime. For 3 days; started 08/16/20   Yes [provider]  senna-docusate (SENOKOT-S) 8.6-50 MG tablet Take 1 tablet by mouth 2 (two) times daily. 07/19/20  Yes Sheikh, Omair Latif, DO  simethicone (MYLICON) 80 MG chewable tablet Chew 160 mg by mouth in the morning, at noon, and at bedtime.   Yes [provider]  sorbitol 70 % SOLN Take 30 mLs by mouth as needed for moderate constipation. 07/19/20  Yes Sheikh, Omair Latif, DO  torsemide [facility])  20 MG tablet Take  3 tablets (60 mg total) by mouth daily. Start 8/15 07/19/20 10/17/20 Yes Sheikh, Omair Latif, DO  camphor-menthol [facility]) lotion Apply 1 application topically every 8 (eight) hours as needed for itching. Patient not taking: Reported on 08/18/2020 07/19/20   Marguerita Merles Latif, DO  hydrALAZINE (APRESOLINE) 25 MG tablet Take 1 tablet (25 mg total) by mouth 3 (three) times daily. Patient not taking: Reported on 08/18/2020 04/25/20   Shon Malczewski, MD  hydrOXYzine (ATARAX/VISTARIL) 25 MG tablet Take 1 tablet (25 mg total) by mouth every 8 (eight) hours as needed for itching. Patient not taking: Reported on 08/18/2020 07/19/20   Marguerita Merles Latif, DO  Nutritional Supplements (FEEDING SUPPLEMENT, NEPRO CARB STEADY,) LIQD Take 237 mLs by mouth 3 (three) times daily as needed (Supplement). Patient not taking: Reported on 08/18/2020 07/19/20   Marguerita Merles Latif, DO   Allergies  Allergen Reactions  . Penicillins Anaphylaxis    Did it involve swelling of the face/tongue/throat, SOB, or low BP? Yes Did it involve sudden or severe rash/hives, skin peeling, or any reaction on the inside of your mouth or nose? Unknown Did you need to seek medical attention at a hospital or doctor's office? Unknown When did it last happen?Within past 10 years If all above answers are "NO", may proceed with cephalosporin use.   . Chicken Allergy Other (See Comments)    unk on mar  . Contrast Media [Iodinated Diagnostic Agents] Other (See Comments)    unk on mar  . Eggs Or Egg-Derived Products Swelling    Patient is allergic to egg yolk, but can eat egg white with no problems  . Milk-Related Compounds Other (See Comments)    unk on mar  . Nickel Other (See Comments)    unk on mar  . Oatmeal Other (See Comments)    unk on mar  . Other     Black beans, blueberry, cats, dogs, grass clippings  . Vaccinium Angustifolium Hives  . Wheat Bran Other (See Comments)    unk on mar   Review of Systems  Unable to perform ROS:  Mental status change    Physical Exam Constitutional:      General: She is not in acute distress.    Appearance: She is ill-appearing.  Pulmonary:     Effort: No respiratory distress.  Skin:    General: Skin is cool and dry.  Neurological:     Mental Status: She is alert. She is disoriented and confused.     Motor: Weakness present.  Psychiatric:        Speech: She is noncommunicative.        Cognition and Memory: Cognition is impaired. Memory is impaired.     Vital Signs: BP (!) 160/91   Pulse 84   Temp (!) 97.5 F (36.4 C) (Axillary)   Resp 17   SpO2 100%  Pain Scale: Faces       SpO2: SpO2: 100 % O2 Device:SpO2: 100 % O2 Flow Rate: .   IO: Intake/output summary: No intake or output data in the 24 hours ending 08/18/20 1242  LBM:   Baseline Weight:   Most recent weight:       Palliative Assessment/Data: PPS 10%     Time In: 1315 Time Out: 1425 Time Total: 70 minutes  Greater than 50%  of this time was spent counseling and coordinating care related to the above assessment and plan.  Signed by: Haskel Khan, NP   Please contact  Palliative Medicine Team phone at [phone] for questions and concerns.  For individual provider: See Shea Evans

## 2020-08-18 NOTE — Progress Notes (Signed)
Pt had a less than 10 seconds grand mal seizure episode on being transferred from Ed stretcher to bed, prn ativan given. Seizure precautions initiated

## 2020-08-18 NOTE — ED Notes (Signed)
While this RN at bedside, pt began to have seizure-like activity lasting approx 30 secs before becoming unresponsive. Pt repositioned onto side, aspirate suctioned from airway. MD to bedside.

## 2020-08-18 NOTE — Consult Note (Addendum)
Neurology Consultation Reason for Consult: Altered mental status Referring Physician: Christella Noa  CC: Nonverbal  History is obtained from: EMS and chart review  HPI: Victoria Winters is a 69 y.o. female with a past medical history significant for diabetes (8.1% on 06/12/2020), stage V CKD (declining dialysis), hypertension (on multiple antihypertensives and midodrine), hyperlipidemia, vascular dementia, adrenal insufficiency, chronic pain, obesity  Per history provided by EMS, the patient was in her normal state of health (per chart review (palliative care note 8/24) nonambulatory at baseline, sundowning managed with Seroquel at bedtime, incontinent of bowel and bladder, total care) at 9 PM last night.  This morning when checking on another patient staff noticed that she looked pale and when they checked on her found her to be nonresponsive.  There was concern for dilated nonresponsive pupils.  When they started to move her to check vitals etc. she had bilious vomit.  EMS reports that she was totally nonresponsive on their initial arrival, but improved en route to moving her left upper extremity spontaneously and reacting slightly to her name.  She has had multiple recent admissions > 7/6-9 for acute comminuted fracture of the distal left tibia and fibula > 8/9-13 for AKI on CKD (not felt to be a candidate for hemodialysis) Per her last discharge summary, patient/family are moving towards focusing on quality of life  LKW: 9 PM on 9/13 tPA given?: No, due to out of the window IA performed?: Not considered due to MRS: 5 - Severe disability. Requires constant nursing care and attention, bedridden, incontinent.  ROS:  Unable to obtain due to altered mental status.   Past Medical History:  Diagnosis Date  . Brain aneurysm   . Diabetes mellitus without complication ([place])   . Hallucinations    visual and auditory  . Memory loss   . Recurrent UTI   . Renal disorder   . Stage 5 chronic kidney  disease ([place])   . Stroke [facility])    Past Surgical History:  Procedure Laterality Date  . CHOLECYSTECTOMY    . SKIN GRAFT    . TIBIA IM NAIL INSERTION Left 06/11/2020   Procedure: OPEN REDUCTION INTERNAL FIXATION LEFT TIBIA;  Surgeon: Wylene Simmer, MD;  Location: [facility];  Service: Orthopedics;  Laterality: Left;   No current facility-administered medications for this encounter.  Current Outpatient Medications:  .  acetaminophen (TYLENOL) 325 MG tablet, Take 2 tablets (650 mg total) by mouth every 6 (six) hours as needed for mild pain (or Fever >/= 101)., Disp: 30 tablet, Rfl:  .  aspirin EC 81 MG tablet, Take 1 tablet (81 mg total) by mouth 2 (two) times daily., Disp: 84 tablet, Rfl: 0 .  atorvastatin (LIPITOR) 20 MG tablet, Take 20 mg by mouth at bedtime. , Disp: , Rfl:  .  bisacodyl (DULCOLAX) 10 MG suppository, Place 10 mg rectally at bedtime., Disp: , Rfl:  .  cetirizine (ZYRTEC) 10 MG tablet, Take 10 mg by mouth at bedtime. , Disp: , Rfl:  .  EPINEPHrine 0.3 mg/0.3 mL IJ SOAJ injection, Inject 0.3 mg into the muscle daily as needed for anaphylaxis., Disp: , Rfl:  .  escitalopram (LEXAPRO) 10 MG tablet, Take 10 mg by mouth every morning., Disp: , Rfl:  .  fludrocortisone (FLORINEF) 0.1 MG tablet, Take 0.2 mg by mouth daily. , Disp: , Rfl:  .  insulin glargine (LANTUS) 100 UNIT/ML injection, Inject 0.1 mLs (10 Units total) into the skin at bedtime., Disp: 10 mL, Rfl: 11 .  insulin lispro (HUMALOG) 100 UNIT/ML injection, Inject 4-12 Units into the skin 3 (three) times daily before meals. Per sliding scale 180-220= 4 units 221-260= 6 units 261-300= 8 units 301-340=10units 341-380=12units, Disp: , Rfl:  .  lactulose (CHRONULAC) 10 GM/15ML solution, Take 20 g by mouth at bedtime., Disp: , Rfl:  .  linaclotide (LINZESS) 145 MCG CAPS capsule, Take 145 mcg by mouth daily before breakfast., Disp: , Rfl:  .  linagliptin (TRADJENTA) 5 MG TABS tablet, Take 5 mg by mouth every morning., Disp: , Rfl:  .   melatonin 5 MG TABS, Take 5 mg by mouth at bedtime. , Disp: , Rfl:  .  metoprolol succinate (TOPROL-XL) 50 MG 24 hr tablet, Take 1 tablet (50 mg total) by mouth every morning., Disp: , Rfl:  .  midodrine (PROAMATINE) 5 MG tablet, Take 1 tablet (5 mg total) by mouth 3 (three) times daily with meals., Disp: , Rfl:  .  Multiple Vitamin (MULTIVITAMIN WITH MINERALS) TABS tablet, Take 1 tablet by mouth daily., Disp: , Rfl:  .  ondansetron (ZOFRAN) 4 MG tablet, Take 1 tablet (4 mg total) by mouth every 6 (six) hours as needed for nausea., Disp: 20 tablet, Rfl: 0 .  oxyCODONE (OXY IR/ROXICODONE) 5 MG immediate release tablet, Take 1 tablet (5 mg total) by mouth every 4 (four) hours as needed for severe pain., Disp: 5 tablet, Rfl: 0 .  OXYGEN, Inhale 2 L/min into the lungs continuous as needed (shortness of breath/desaturation)., Disp: , Rfl:  .  polyethylene glycol (MIRALAX / GLYCOLAX) 17 g packet, Take 17 g by mouth 2 (two) times daily., Disp: 14 each, Rfl: 0 .  potassium chloride SA (KLOR-CON) 20 MEQ tablet, Take 1 tablet (20 mEq total) by mouth daily., Disp: 60 tablet, Rfl: 3 .  pregabalin (LYRICA) 75 MG capsule, Take 75 mg by mouth 2 (two) times daily., Disp: , Rfl:  .  QUEtiapine (SEROQUEL) 25 MG tablet, Take 1 tablet (25 mg total) by mouth at bedtime., Disp:  , Rfl:  .  Rectal Cleansers (FLEET NATURALS CLEANSING ENEMA RE), Place 1 application rectally at bedtime. For 3 days; started 08/16/20, Disp: , Rfl:  .  senna-docusate (SENOKOT-S) 8.6-50 MG tablet, Take 1 tablet by mouth 2 (two) times daily., Disp: , Rfl:  .  simethicone (MYLICON) 80 MG chewable tablet, Chew 160 mg by mouth in the morning, at noon, and at bedtime., Disp: , Rfl:  .  sorbitol 70 % SOLN, Take 30 mLs by mouth as needed for moderate constipation., Disp: , Rfl:  .  torsemide (DEMADEX) 20 MG tablet, Take 3 tablets (60 mg total) by mouth daily. Start 8/15, Disp: 180 tablet, Rfl: 3 .  camphor-menthol (SARNA) lotion, Apply 1 application  topically every 8 (eight) hours as needed for itching. (Patient not taking: Reported on 08/18/2020), Disp: 222 mL, Rfl: 0 .  hydrALAZINE (APRESOLINE) 25 MG tablet, Take 1 tablet (25 mg total) by mouth 3 (three) times daily. (Patient not taking: Reported on 08/18/2020), Disp: 90 tablet, Rfl: 3 .  hydrOXYzine (ATARAX/VISTARIL) 25 MG tablet, Take 1 tablet (25 mg total) by mouth every 8 (eight) hours as needed for itching. (Patient not taking: Reported on 08/18/2020), Disp: 30 tablet, Rfl: 0 .  Nutritional Supplements (FEEDING SUPPLEMENT, NEPRO CARB STEADY,) LIQD, Take 237 mLs by mouth 3 (three) times daily as needed (Supplement). (Patient not taking: Reported on 08/18/2020), Disp: , Rfl: 0   Family History  Problem Relation Age of Onset  . Heart disease Mother   .  Diabetes Mellitus II Mother   . Heart disease Father   . Diabetes Father   . Heart disease Brother   . Cancer Other     Social History:  reports that she has quit smoking. She has never used smokeless tobacco. She reports current alcohol use. She reports that she does not use drugs.  Exam: Current vital signs: BP (!) 180/96   Pulse 79   Temp (!) 97.5 F (36.4 C) (Axillary)   Resp 17   SpO2 95%  Vital signs in last 24 hours: Temp:  [97.5 F (36.4 C)] 97.5 F (36.4 C) (09/12 0706) Pulse Rate:  [76-79] 79 (09/12 0745) Resp:  [15-24] 17 (09/12 0745) BP: (176-214)/(69-97) 180/96 (09/12 0755) SpO2:  [80 %-100 %] 95 % (09/12 0745)   Physical Exam  Constitutional: Chronically ill-appearing Psych: Minimally responsive/interactive Eyes: No scleral injection HENT: No OP obstruction.  Poor dentition MSK: No joint deformities.  Cardiovascular: Normal rate and regular rhythm, occasional PVCs on monitoring.  Respiratory: Effort normal, non-labored breathing GI: Soft.  No distension. There is no tenderness.  Skin: Cast in place on the left lower extremity  Neuro: Mental Status: Patient is stuporous, and improving throughout the  course of serial examinations.  My best examination she was occasionally opening eyes spontaneously and localizing of the bilateral upper extremities. Cranial Nerves: II: No blink to threat.  Pupils are equal, round, and reactive to light.  3 to 2 mm III,IV, VI: Horizontal VOR intact V: Facial sensation is symmetric to eyelash brush VII: Facial movement is symmetric to noxious stimulation VIII: Minimal reliable response to voice/sounds X/XI: Cough/gag intact  Motor: Tone is low.  No movement to noxious stimulation in the bilateral lower extremities, but localized with the bilateral upper extremities almost 3/5 Sensory: Equally responsive to noxious stimulation in all 4 extremities Deep Tendon Reflexes: Upper extremities and right patella were difficult to obtain, 2+ left patellar reflex  Unable to perform cerebellar or gait testing due to mental status  NIH stroke scale: 24 Level of consciousness 2 LOC questions 2 LOC commands 2 Visual Fields 3 Motor left arm 2 Motor right arm 2 Motor left leg 4 Motor right leg 4 Best language 3  I have reviewed labs in epic and the results pertinent to this consultation are: Glucose 335 Creatinine 2.2 White blood cell count 12  I have reviewed the images obtained: Head CT with substantial chronic microvascular disease and potential new hypodensities in the posterior part of the brain  Impression: This is a 69 year old woman presenting with altered mental status, likely toxic/metabolic based on prior presentations.  Agree with radiology that her head CT could be potentially compatible with PRES, especially given her hypertension.  Posterior circulation thrombus is also possibility, but given her poor functional baseline as documented in the chart, she is not a candidate for intra-arterial thrombectomy.  Therefore CTA/CTP were not pursued, especially given the patient is end-stage renal disease and not a candidate for dialysis (therefore at risk of  significant harm from iodinated contrast).   Recommendations: -Appreciate hypoxia work-up per ED/primary team -MRI brain without contrast when clinically stable for this scan -In the interim blood pressure goal should be < 140 -Further recommendations pending MRI  Lesleigh Noe MD-PhD [facility] [phone]  Addended for charge capture

## 2020-08-18 NOTE — Progress Notes (Signed)
Writer received referral for pt to dc to [facility] for EOL care.   Spoke with pt's sister, [name], to confirm interest and answer questions.   [facility] currently has no bed availability. MD to review pt's chart to determine eligibility.   Liaison will update TOC and family tomorrow re: eligibility and bed availability.   Please do not hesitate to outreach with any questions and thank you for the referral.   Freddie Breech, RN  [facility] Liaison [phone]

## 2020-08-18 NOTE — H&P (Addendum)
History and Physical    Victoria Winters XIP:[id number] DOB: July 24, 1951 DOA: 08/18/2020  PCP: Alfonse Flavors, MD  Patient coming from: [facility]  I have personally briefly reviewed patient's old medical records in [place]  Chief Complaint: Altered mental status HPI: Victoria Winters is a 69 y.o. female with medical history significant of hypertension, hyperlipidemia, diabetes mellitus, CKD stage IV, dementia, chronic diastolic CHF, distal left tibia and fibula fracture status post ORIF on 7/6, CVA, chronic hypoxemic respiratory failure-on 2 L of oxygen at baseline, obesity brought by EMS to the emergency department for evaluation of altered mental status.  History is limited due to patient's mental status.  History gathered from charts.  As per EMS patient was in her normal state of health at 9 PM last night.  This morning staff noticed that patient looks pale and found to be nonresponsive, her pupils were dilated and she had a bilious vomitus when the staff was trying to check her vitals, heart oxygen saturation was noted to be in 80s on room air-she was placed on nonrebreather and brought to the ER for further evaluation and management.  At baseline: Patient is nonambulatory, incontinent of bowel and bladder.  She admitted twice in last 2 months with acute comminuted fracture of distal left tibia and fibula on 7/6 and AKI on CKD on 8/9.  ED Course: Upon arrival to ED: Patient tachycardic, blood pressure elevated, initially requiring nonrebreather mask, afebrile CBC shows leukocytosis of 12, normocytic anemia at baseline, CMP shows CKD stage IV at baseline, lactic acid: 2.1, ethanol, PT/INR: WNL, COVID-19, UA, UDS, blood culture: Pending.  Chest x-ray concerning for aspiration pneumonia, CT head concerning for PRES. EDP consulted neurology who recommended MRI.  Patient was given hydralazine 10 mg IV once-her blood pressure trended down in 67H systolic.  She was given IV fluid  bolus after that.   [facility] hospitalist consulted for admission due to acute metabolic encephalopathy.  In ED: Patient had seizure-like activity-she was noted to be pulseless therefore 1 round of chest compressions performed in ED.  Her CODE STATUS was confirmed DNR/DNI.  Patient's subsequently had pulses.  Family did not want heroic measures which include ICU care.  Review of Systems: As per HPI otherwise negative.    Past Medical History:  Diagnosis Date  . Brain aneurysm   . Diabetes mellitus without complication ([place])   . Hallucinations    visual and auditory  . Memory loss   . Recurrent UTI   . Renal disorder   . Stage 5 chronic kidney disease ([place])   . Stroke [facility])     Past Surgical History:  Procedure Laterality Date  . CHOLECYSTECTOMY    . SKIN GRAFT    . TIBIA IM NAIL INSERTION Left 06/11/2020   Procedure: OPEN REDUCTION INTERNAL FIXATION LEFT TIBIA;  Surgeon: Wylene Simmer, MD;  Location: [place];  Service: Orthopedics;  Laterality: Left;     reports that she has quit smoking. She has never used smokeless tobacco. She reports current alcohol use. She reports that she does not use drugs.  Allergies  Allergen Reactions  . Penicillins Anaphylaxis    Did it involve swelling of the face/tongue/throat, SOB, or low BP? Yes Did it involve sudden or severe rash/hives, skin peeling, or any reaction on the inside of your mouth or nose? Unknown Did you need to seek medical attention at a hospital or doctor's office? Unknown When did it last happen?Within past 10 years If all above  answers are "NO", may proceed with cephalosporin use.   . Chicken Allergy Other (See Comments)    unk on mar  . Contrast Media [Iodinated Diagnostic Agents] Other (See Comments)    unk on mar  . Eggs Or Egg-Derived Products Swelling    Patient is allergic to egg yolk, but can eat egg white with no problems  . Milk-Related Compounds Other (See Comments)    unk on mar  . Nickel Other (See Comments)     unk on mar  . Oatmeal Other (See Comments)    unk on mar  . Other     Black beans, blueberry, cats, dogs, grass clippings  . Vaccinium Angustifolium Hives  . Wheat Bran Other (See Comments)    unk on mar    Family History  Problem Relation Age of Onset  . Heart disease Mother   . Diabetes Mellitus II Mother   . Heart disease Father   . Diabetes Father   . Heart disease Brother   . Cancer Other     Prior to Admission medications   Medication Sig Start Date End Date Taking? Authorizing Provider  acetaminophen (TYLENOL) 325 MG tablet Take 2 tablets (650 mg total) by mouth every 6 (six) hours as needed for mild pain (or Fever >/= 101). 07/19/20  Yes Sheikh, Omair Latif, DO  aspirin EC 81 MG tablet Take 1 tablet (81 mg total) by mouth 2 (two) times daily. 06/12/20  Yes Corky Sing, PA-C  atorvastatin (LIPITOR) 20 MG tablet Take 20 mg by mouth at bedtime.  09/29/19  Yes [provider]  bisacodyl (DULCOLAX) 10 MG suppository Place 10 mg rectally at bedtime.   Yes [provider]  cetirizine (ZYRTEC) 10 MG tablet Take 10 mg by mouth at bedtime.    Yes [provider]  EPINEPHrine 0.3 mg/0.3 mL IJ SOAJ injection Inject 0.3 mg into the muscle daily as needed for anaphylaxis.   Yes [provider]  escitalopram (LEXAPRO) 10 MG tablet Take 10 mg by mouth every morning.   Yes [provider]  fludrocortisone (FLORINEF) 0.1 MG tablet Take 0.2 mg by mouth daily.  03/19/20  Yes [provider]  insulin glargine (LANTUS) 100 UNIT/ML injection Inject 0.1 mLs (10 Units total) into the skin at bedtime. 07/29/19  Yes Kathie Dike, MD  insulin lispro (HUMALOG) 100 UNIT/ML injection Inject 4-12 Units into the skin 3 (three) times daily before meals. Per sliding scale 180-220= 4 units 221-260= 6 units 261-300= 8 units 301-340=10units 341-380=12units   Yes [provider]  lactulose (CHRONULAC) 10 GM/15ML solution Take 20 g by mouth at  bedtime.   Yes [provider]  linaclotide (LINZESS) 145 MCG CAPS capsule Take 145 mcg by mouth daily before breakfast.   Yes [provider]  linagliptin (TRADJENTA) 5 MG TABS tablet Take 5 mg by mouth every morning.   Yes [provider]  melatonin 5 MG TABS Take 5 mg by mouth at bedtime.    Yes [provider]  metoprolol succinate (TOPROL-XL) 50 MG 24 hr tablet Take 1 tablet (50 mg total) by mouth every morning. 07/19/20  Yes Sheikh, Omair Latif, DO  midodrine (PROAMATINE) 5 MG tablet Take 1 tablet (5 mg total) by mouth 3 (three) times daily with meals. 07/19/20  Yes Sheikh, Omair Latif, DO  Multiple Vitamin (MULTIVITAMIN WITH MINERALS) TABS tablet Take 1 tablet by mouth daily.   Yes [provider]  ondansetron (ZOFRAN) 4 MG tablet Take 1  tablet (4 mg total) by mouth every 6 (six) hours as needed for nausea. 07/19/20  Yes Sheikh, Omair Latif, DO  oxyCODONE (OXY IR/ROXICODONE) 5 MG immediate release tablet Take 1 tablet (5 mg total) by mouth every 4 (four) hours as needed for severe pain. 07/19/20  Yes Sheikh, Omair Latif, DO  OXYGEN Inhale 2 L/min into the lungs continuous as needed (shortness of breath/desaturation).   Yes [provider]  polyethylene glycol (MIRALAX / GLYCOLAX) 17 g packet Take 17 g by mouth 2 (two) times daily. 07/19/20  Yes Sheikh, Omair Latif, DO  potassium chloride SA (KLOR-CON) 20 MEQ tablet Take 1 tablet (20 mEq total) by mouth daily. 07/19/20  Yes Sheikh, Omair Latif, DO  pregabalin (LYRICA) 75 MG capsule Take 75 mg by mouth 2 (two) times daily.   Yes [provider]  QUEtiapine (SEROQUEL) 25 MG tablet Take 1 tablet (25 mg total) by mouth at bedtime. 07/29/19  Yes Kathie Dike, MD  Rectal Cleansers (FLEET NATURALS CLEANSING ENEMA RE) Place 1 application rectally at bedtime. For 3 days; started 08/16/20   Yes [provider]  senna-docusate (SENOKOT-S) 8.6-50 MG tablet Take 1 tablet by mouth 2 (two)  times daily. 07/19/20  Yes Sheikh, Omair Latif, DO  simethicone (MYLICON) 80 MG chewable tablet Chew 160 mg by mouth in the morning, at noon, and at bedtime.   Yes [provider]  sorbitol 70 % SOLN Take 30 mLs by mouth as needed for moderate constipation. 07/19/20  Yes Sheikh, Omair Latif, DO  torsemide (DEMADEX) 20 MG tablet Take 3 tablets (60 mg total) by mouth daily. Start 8/15 07/19/20 10/17/20 Yes Sheikh, Omair Latif, DO  camphor-menthol [facility]) lotion Apply 1 application topically every 8 (eight) hours as needed for itching. Patient not taking: Reported on 08/18/2020 07/19/20   Raiford Noble Latif, DO  hydrALAZINE (APRESOLINE) 25 MG tablet Take 1 tablet (25 mg total) by mouth 3 (three) times daily. Patient not taking: Reported on 08/18/2020 04/25/20   Roxan Hockey, MD  hydrOXYzine (ATARAX/VISTARIL) 25 MG tablet Take 1 tablet (25 mg total) by mouth every 8 (eight) hours as needed for itching. Patient not taking: Reported on 08/18/2020 07/19/20   Raiford Noble Latif, DO  Nutritional Supplements (FEEDING SUPPLEMENT, NEPRO CARB STEADY,) LIQD Take 237 mLs by mouth 3 (three) times daily as needed (Supplement). Patient not taking: Reported on 08/18/2020 07/19/20   Raiford Noble [place], DO    Physical Exam: Vitals:   08/18/20 0815 08/18/20 0830 08/18/20 0845 08/18/20 0900  BP: (!) 155/72 101/65 91/64 (!) 94/57  Pulse: (!) 138 (!) 111 (!) 108 (!) 107  Resp: 17 19 19 18   Temp:      TempSrc:      SpO2: 100% 99% 98% 99%    Constitutional: NAD, calm, comfortable, on 2 L of oxygen via nasal cannula, obese Eyes: PERRL, lids and conjunctivae normal ENMT: Mucous membranes are dry. Posterior pharynx clear of any exudate or lesions.Normal dentition.  Neck: normal, supple, no masses, no thyromegaly Respiratory: clear to auscultation bilaterally, no wheezing, no crackles. Normal respiratory effort. No accessory muscle use.  Cardiovascular: Regular rate and rhythm, no murmurs / rubs / gallops.   Bilateral 1+ pitting edema positive. 2+ pedal pulses. No carotid bruits.  Abdomen: Soft, distended, no masses palpated. No hepatosplenomegaly. Bowel sounds positive.  Musculoskeletal: Left leg: splint Skin: no rashes, lesions, ulcers. No induration Neurologic: Not alert.  Not following commands.  Nonverbal.  Responds to pain only.  Moves all extremities equally  with pain..   Labs on Admission: I have personally reviewed following labs and imaging studies  CBC: Recent Labs  Lab 08/18/20 0642 08/18/20 0650 08/18/20 0655  WBC 12.0*  --   --   NEUTROABS 9.6*  --   --   HGB 9.1* 9.9* 9.5*  HCT 30.3* 29.0* 28.0*  MCV 86.1  --   --   PLT 264  --   --    Basic Metabolic Panel: Recent Labs  Lab 08/18/20 0642 08/18/20 0650 08/18/20 0655  NA 142 142 142  K 4.2 4.1 4.2  CL 105 103  --   CO2 26  --   --   GLUCOSE 340* 335*  --   BUN 19 20  --   CREATININE 2.42* 2.20*  --   CALCIUM 8.1*  --   --    GFR: CrCl cannot be calculated (Unknown ideal weight.). Liver Function Tests: Recent Labs  Lab 08/18/20 0642  AST 20  ALT 11  ALKPHOS 84  BILITOT 0.6  PROT 5.7*  ALBUMIN 2.5*   No results for input(s): LIPASE, AMYLASE in the last 168 hours. No results for input(s): AMMONIA in the last 168 hours. Coagulation Profile: Recent Labs  Lab 08/18/20 0642  INR 1.0   Cardiac Enzymes: No results for input(s): CKTOTAL, CKMB, CKMBINDEX, TROPONINI in the last 168 hours. BNP (last 3 results) No results for input(s): PROBNP in the last 8760 hours. HbA1C: No results for input(s): HGBA1C in the last 72 hours. CBG: No results for input(s): GLUCAP in the last 168 hours. Lipid Profile: No results for input(s): CHOL, HDL, LDLCALC, TRIG, CHOLHDL, LDLDIRECT in the last 72 hours. Thyroid Function Tests: No results for input(s): TSH, T4TOTAL, FREET4, T3FREE, THYROIDAB in the last 72 hours. Anemia Panel: No results for input(s): VITAMINB12, FOLATE, FERRITIN, TIBC, IRON, RETICCTPCT in the last  72 hours. Urine analysis:    Component Value Date/Time   COLORURINE YELLOW 07/16/2020 1438   APPEARANCEUR HAZY (A) 07/16/2020 1438   LABSPEC 1.010 07/16/2020 1438   PHURINE 5.0 07/16/2020 1438   GLUCOSEU 50 (A) 07/16/2020 1438   HGBUR NEGATIVE 07/16/2020 1438   [place] 07/16/2020 1438   [place] 07/16/2020 1438   PROTEINUR 100 (A) 07/16/2020 1438   NITRITE NEGATIVE 07/16/2020 1438   LEUKOCYTESUR NEGATIVE 07/16/2020 1438    Radiological Exams on Admission: DG Chest Portable 1 View  Result Date: 08/18/2020 CLINICAL DATA:  Altered mental status and hypoxia EXAM: PORTABLE CHEST 1 VIEW COMPARISON:  June 11, 2020; Apr 17, 2020; September 05, 2019 FINDINGS: Subtle increased opacity is noted in the right base. Lungs elsewhere clear. There is cardiomegaly with pulmonary vascularity normal. No adenopathy. Chondroid matrix opacity is noted in the proximal humerus on the left. IMPRESSION: Subtle increased opacity right base. Question developing pneumonia or possible small focus of aspiration. Lungs elsewhere clear. There is cardiomegaly with pulmonary vascularity normal. Question bone infarct versus enchondroma left proximal humerus. This finding was present on prior chest radiographic examinations. Electronically Signed   By: Lowella Grip III M.D.   On: 08/18/2020 08:07   CT HEAD CODE STROKE WO CONTRAST  Result Date: 08/18/2020 CLINICAL DATA:  Code stroke. Initial evaluation for unresponsiveness, left-sided weakness. EXAM: CT HEAD WITHOUT CONTRAST TECHNIQUE: Contiguous axial images were obtained from the base of the skull through the vertex without intravenous contrast. COMPARISON:  Prior head CT from 07/15/2020. FINDINGS: Brain: Atrophy with chronic small vessel ischemic disease. Few scatter remote lacunar infarcts present about the bilateral  basal ganglia. Abnormal patchy multifocal hypodensity seen involving the cortical and subcortical aspects of both cerebral hemispheres,  most pronounced posteriorly. Additional scattered patchy hypodensity within the bilateral cerebellar hemispheres. Suspected involvement of the deep gray nuclei as well. Findings are most suggestive of possible PRES. No definite large vessel territory infarct. No acute intracranial hemorrhage. No mass effect or midline shift. No hydrocephalus or extra-axial fluid collection. Vascular: No hyperdense vessel. Scattered vascular calcifications noted within the carotid siphons. Skull: Scalp soft tissues and calvarium within normal limits. Sinuses/Orbits: Globes and orbital soft tissues within normal limits. Mild scattered mucosal thickening noted within the ethmoidal air cells. Paranasal sinuses are otherwise clear. Trace right mastoid effusion noted, of doubtful significance. Other: None. ASPECTS [facility] Stroke Program Early CT Score) - Ganglionic level infarction (caudate, lentiform nuclei, internal capsule, insula, M1-M3 cortex): 7 - Supraganglionic infarction (M4-M6 cortex): 3 Total score (0-10 with 10 being normal): 10 IMPRESSION: 1. Patchy multifocal hypodensity involving the bilateral cerebral and cerebellar hemispheres, most pronounced posteriorly. Findings most suggestive of PRES. Further evaluation with dedicated MRI would likely be helpful for further characterization. 2. ASPECTS is 10. 3. Underlying age-related cerebral atrophy with chronic microvascular ischemic disease, with a few remote lacunar infarcts about the bilateral basal ganglia. These results were communicated to Dr. Curly Shores at 6:58 amon 9/12/2021by text page via the [facility] messaging system. Electronically Signed   By: Jeannine Boga M.D.   On: 08/18/2020 07:02    EKG: Independently reviewed.  Sinus rhythm, left atrial enlargement, right axis deviation.  Prolonged QT interval.  Assessment/Plan Principal Problem:   Acute encephalopathy Active Problems:   CKD (chronic kidney disease) IV   DM (diabetes mellitus) (HCC)   CVA (cerebral  vascular accident) ([place])   Acute and chronic respiratory failure with hypoxia (HCC)   Anemia in chronic kidney disease (CKD)   Vascular dementia with behavior disturbance (HCC)   Aspiration pneumonia (HCC)   Acute metabolic encephalopathy    Acute metabolic encephalopathy: -PRES vs stroke vs sepsis secondary to pneumonia? -Patient tachycardic, hypertensive, leukocytosis of 12.0, lactic acid of 2.1, reviewed chest x-ray.  COVID-19 pending. -Reviewed CT head.  MRI brain is pending. -Admit patient stepdown unit for close monitoring.  On telemetry -Evaluated by neurology -We will keep her n.p.o. -Consult PT/OT/SLP -Ethanol, PT/INR: WNL.  Check TSH, ammonia, UA, UDS -On fall/aspiration precautions  Seizure-like activity: -Check electrolytes. -MRI is pending.  May need EEG. -Neurology is following  Acute on chronic hypoxemic respiratory failure: Secondary to aspiration pneumonia: She is back to her baseline-on 2 L of oxygen via nasal cannula. -Patient had a vomitus this morning as per EMS report.  Reviewed chest x-ray.  Leukocytosis of 12.0, lactic acid: 2.1 -Received IV fluids in ED.  She is allergic to penicillin.  Discussed with pharmacy.  Start patient on Rocephin.  Blood culture is pending.  Will check procalcitonin level. -We will keep her n.p.o.  On aspiration precautions  CKD stage IV: At baseline -Continue to monitor  Hypertension: Labile -She has history of adrenal insufficiency and on chronic steroids. -Patient hypertensive upon arrival.  Received hydralazine 10 mg IV once in the ED.  Her blood pressure dropped in 92J systolic.  She received IV fluids in ED.  Her blood pressure improved. -Hold blood pressure medicines for now.  Monitor blood pressure closely  Diabetes mellitus: Uncontrolled.  Last A1c checked in July which was 8.1%. -Started patient on sliding scale insulin.  Monitor blood sugar closely.  Anemia of chronic disease: Likely secondary to  underlying CKD stage  IV.  H&H is stable.  Continue to monitor.  Vascular dementia with behavioral disturbances: -Hold Lexapro and Seroquel as patient is n.p.o.  Fracture of distal left tibia and fibula: -Status post ORIF on 7/6  Chronic diastolic CHF: Patient appears euvolemic on exam. -Echo from 5/21 showed preserved ejection fraction with grade 2 diastolic dysfunction. -Hold diuretics for now.  Monitor signs for fluid overload.  Adrenal insufficiency: -Patient blood pressure improves with IV fluids. -On Florinef-hold for now.  Morbid obesity with BMI of 40  Goals of care: I had a detailed conversation with patient's Sister [name] who is power of attorney and caregiver.  She tells me that she does not want her sister to suffer more, she wants comfort measures only.  Does not want heroic measures, No  MRI or any further images, no IV antibiotics. I discontinued IV fluids and IV antibiotic orders.  I will consult palliative care.  DVT prophylaxis: Heparin/SCD Code Status: DNR/DNI-confirmed with patient's sister Family Communication: None present at bedside.  Plan of care discussed with patient in length and he verbalized understanding and agreed with it. Disposition Plan: Nursing home in 2 to 3 days Consults called: Neurology by EDP Admission status: Inpatient   Mckinley Jewel MD [facility]  If 7PM-7AM, please contact night-coverage [email or web address] Password [facility]  08/18/2020, 9:36 AM

## 2020-08-18 NOTE — TOC Progression Note (Signed)
Transition of Care [facility]) - Progression Note    Patient Details  Name: Victoria Winters MRN: [id number] Date of Birth: 11-19-1951  Transition of Care [facility]) CM/SW [place], RN Phone Number: 08/18/2020, 1:29 PM  Clinical Narrative:    Damaris Schooner to sister Jesse Sans who stated she would like Ms Wrobel to be in [place], however it really does not matter which residential hospice as long as she is notified when the patient is getting a bed.         Expected Discharge Plan and Services                                                 Social Determinants of Health (SDOH) Interventions    Readmission Risk Interventions Readmission Risk Prevention Plan 07/18/2020 06/12/2020 04/23/2020  Transportation Screening Complete Complete Complete  PCP or Specialist Appt within 3-5 Days - - Complete  Not Complete comments - - -  Medication Review (RN CM) - - -  [place] or [place] - - Complete  HRI or Home Care Consult comments - - -  Social Work Consult for [place] Planning/Counseling - - Complete  Palliative Care Screening - - Not Complete  Medication Review ([place]) Referral to Pharmacy Complete Complete  PCP or Specialist appointment within 3-5 days of discharge Not Complete Complete -  PCP/Specialist Appt Not Complete comments LTC SNF - -  [place] or [place] Not Complete Complete -  [place] or Home Care Consult Pt Refusal Comments LTC SNF - -  SW Recovery Care/Counseling Consult Complete Complete -  Palliative Care Screening Complete Complete -  Skilled Nursing Facility Complete Complete -

## 2020-08-18 NOTE — ED Provider Notes (Signed)
[facility] EMERGENCY DEPARTMENT Provider Note   CSN: [id number] Arrival date & time: 08/18/20  7829  An emergency department physician performed an initial assessment on this suspected stroke patient at [phone].  History Chief Complaint  Patient presents with  . Code Stroke    Victoria Winters is a 69 y.o. female with PMH significant for CVA, stage IV CKD, DM, chronic diastolic CHF, and dementia presents to the ED via EMS for code stroke.  History was obtained by EMS and patient was found to be altered and pale this morning by staff at [facility].  She has been intermittently hypoxic and has subsequently placed on NRB.  Patient is DNR.    Patient was encountered at the bridge by Dr. Rex Kras who initially evaluated patient and placed orders for code stroke given her AMS and reluctance to move right arm or right leg.  Given LKN 9 PM last evening, she will not be candidate for TPA.  I reviewed CT Head WO contrast obtained 07/15/2020 which demonstrated multiple chronic small vessel infarcts and nonspecific patchy hyperintensity in the supratentorial white matter suggestive of chronic microvascular ischemic changes.    I spoke with Verdis Frederickson, patient's nurse at [facility], who reports that she patient took her medications yesterday and was mildly drowsy, but otherwise at baseline.  However, when she saw the patient today she had multiple episodes of vomiting "fecal matter" and had explosive diarrhea in her briefs.  She was also drooling and would not answer questions.  She can typically answer questions if baseline.  She requires 2 L supplemental O2 and is wheelchair bound.  DNR/DNI.  She also notes that there have been 5+ COVID-19 cases in her waiting, but she had tested negative on 08/16/2020.  They are unaware of her vaccination status.  Patient is level 5 caveat due to AMS.  HPI     Past Medical History:  Diagnosis Date  . Brain aneurysm   . Diabetes  mellitus without complication ([place])   . Hallucinations    visual and auditory  . Memory loss   . Recurrent UTI   . Renal disorder   . Stage 5 chronic kidney disease ([place])   . Stroke [facility])     Patient Active Problem List   Diagnosis Date Noted  . Aspiration pneumonia ([place]) 08/18/2020  . Chronic hypoxemic respiratory failure ([place]) 08/18/2020  . Acute metabolic encephalopathy 30/86/5784  . Vascular dementia with behavior disturbance ([place]) 07/16/2020  . Obesity, Class III, BMI 40-49.9 (morbid obesity) ([place]) 07/16/2020  . AKI (acute kidney injury) ([place]) 06/12/2020  . Tibia/fibula fracture, left, closed, initial encounter 06/11/2020  . (HFpEF) heart failure with preserved ejection fraction - Grade 2 diastolic dysfunction 69/62/9528  . Acute on chronic heart failure with preserved ejection fraction (HFpEF) --grade 2 diastolic dysfunction 41/32/4401  . Acute respiratory failure with hypoxia ([place]) 04/23/2020  . Heart failure ([place]) 04/22/2020  . Acute and chronic respiratory failure with hypoxia ([place]) 04/17/2020  . DNR (do not resuscitate) 04/17/2020  . Leukocytosis 04/17/2020  . Anemia in chronic kidney disease (CKD) 04/17/2020  . Closed fracture of distal end of right fibula with routine healing 11/23/2019  . Palliative care by specialist   . Goals of care, counseling/discussion   . History of CVA (cerebrovascular accident)   . Acute encephalopathy 07/21/2019  . Fall 07/20/2019  . CKD (chronic kidney disease) IV 07/20/2019  . Brain aneurysm 07/20/2019  . DM (diabetes mellitus) ([place]) 07/20/2019  . Bilateral fibular  fractures 07/20/2019  . CVA (cerebral vascular accident) ([place]) 07/20/2019  . Hallucination 07/20/2019    Past Surgical History:  Procedure Laterality Date  . CHOLECYSTECTOMY    . SKIN GRAFT    . TIBIA IM NAIL INSERTION Left 06/11/2020   Procedure: OPEN REDUCTION INTERNAL FIXATION LEFT TIBIA;  Surgeon: Wylene Simmer, MD;  Location: [facility];  Service: Orthopedics;   Laterality: Left;     OB History    Gravida  2   Para  2   Term  2   Preterm      AB      Living        SAB      TAB      Ectopic      Multiple      Live Births              Family History  Problem Relation Age of Onset  . Heart disease Mother   . Diabetes Mellitus II Mother   . Heart disease Father   . Diabetes Father   . Heart disease Brother   . Cancer Other     Social History   Tobacco Use  . Smoking status: Former Research scientist (life sciences)  . Smokeless tobacco: Never Used  Vaping Use  . Vaping Use: Never used  Substance Use Topics  . Alcohol use: Yes    Comment: rarely  . Drug use: Never    Home Medications Prior to Admission medications   Medication Sig Start Date End Date Taking? Authorizing Provider  acetaminophen (TYLENOL) 325 MG tablet Take 2 tablets (650 mg total) by mouth every 6 (six) hours as needed for mild pain (or Fever >/= 101). 07/19/20  Yes Sheikh, Omair Latif, DO  aspirin EC 81 MG tablet Take 1 tablet (81 mg total) by mouth 2 (two) times daily. 06/12/20  Yes Corky Sing, PA-C  atorvastatin (LIPITOR) 20 MG tablet Take 20 mg by mouth at bedtime.  09/29/19  Yes [provider]  bisacodyl (DULCOLAX) 10 MG suppository Place 10 mg rectally at bedtime.   Yes [provider]  cetirizine (ZYRTEC) 10 MG tablet Take 10 mg by mouth at bedtime.    Yes [provider]  EPINEPHrine 0.3 mg/0.3 mL IJ SOAJ injection Inject 0.3 mg into the muscle daily as needed for anaphylaxis.   Yes [provider]  escitalopram (LEXAPRO) 10 MG tablet Take 10 mg by mouth every morning.   Yes [provider]  fludrocortisone (FLORINEF) 0.1 MG tablet Take 0.2 mg by mouth daily.  03/19/20  Yes [provider]  insulin glargine (LANTUS) 100 UNIT/ML injection Inject 0.1 mLs (10 Units total) into the skin at bedtime. 07/29/19  Yes Kathie Dike, MD  insulin lispro (HUMALOG) 100 UNIT/ML injection Inject 4-12 Units into the skin 3  (three) times daily before meals. Per sliding scale 180-220= 4 units 221-260= 6 units 261-300= 8 units 301-340=10units 341-380=12units   Yes [provider]  lactulose (CHRONULAC) 10 GM/15ML solution Take 20 g by mouth at bedtime.   Yes [provider]  linaclotide (LINZESS) 145 MCG CAPS capsule Take 145 mcg by mouth daily before breakfast.   Yes [provider]  linagliptin (TRADJENTA) 5 MG TABS tablet Take 5 mg by mouth every morning.   Yes [provider]  melatonin 5 MG TABS Take 5 mg by mouth at bedtime.    Yes [provider]  metoprolol succinate (TOPROL-XL) 50 MG 24 hr tablet Take 1 tablet (50 mg  total) by mouth every morning. 07/19/20  Yes Sheikh, Omair Latif, DO  midodrine (PROAMATINE) 5 MG tablet Take 1 tablet (5 mg total) by mouth 3 (three) times daily with meals. 07/19/20  Yes Sheikh, Omair Latif, DO  Multiple Vitamin (MULTIVITAMIN WITH MINERALS) TABS tablet Take 1 tablet by mouth daily.   Yes [provider]  ondansetron (ZOFRAN) 4 MG tablet Take 1 tablet (4 mg total) by mouth every 6 (six) hours as needed for nausea. 07/19/20  Yes Sheikh, Omair Latif, DO  oxyCODONE (OXY IR/ROXICODONE) 5 MG immediate release tablet Take 1 tablet (5 mg total) by mouth every 4 (four) hours as needed for severe pain. 07/19/20  Yes Sheikh, Omair Latif, DO  OXYGEN Inhale 2 L/min into the lungs continuous as needed (shortness of breath/desaturation).   Yes [provider]  polyethylene glycol (MIRALAX / GLYCOLAX) 17 g packet Take 17 g by mouth 2 (two) times daily. 07/19/20  Yes Sheikh, Omair Latif, DO  potassium chloride SA (KLOR-CON) 20 MEQ tablet Take 1 tablet (20 mEq total) by mouth daily. 07/19/20  Yes Sheikh, Omair Latif, DO  pregabalin (LYRICA) 75 MG capsule Take 75 mg by mouth 2 (two) times daily.   Yes [provider]  QUEtiapine (SEROQUEL) 25 MG tablet Take 1 tablet (25 mg total) by mouth at bedtime. 07/29/19  Yes Kathie Dike, MD   Rectal Cleansers (FLEET NATURALS CLEANSING ENEMA RE) Place 1 application rectally at bedtime. For 3 days; started 08/16/20   Yes [provider]  senna-docusate (SENOKOT-S) 8.6-50 MG tablet Take 1 tablet by mouth 2 (two) times daily. 07/19/20  Yes Sheikh, Omair Latif, DO  simethicone (MYLICON) 80 MG chewable tablet Chew 160 mg by mouth in the morning, at noon, and at bedtime.   Yes [provider]  sorbitol 70 % SOLN Take 30 mLs by mouth as needed for moderate constipation. 07/19/20  Yes Sheikh, Omair Latif, DO  torsemide (DEMADEX) 20 MG tablet Take 3 tablets (60 mg total) by mouth daily. Start 8/15 07/19/20 10/17/20 Yes Sheikh, Omair Latif, DO  camphor-menthol [facility]) lotion Apply 1 application topically every 8 (eight) hours as needed for itching. Patient not taking: Reported on 08/18/2020 07/19/20   Raiford Noble Latif, DO  hydrALAZINE (APRESOLINE) 25 MG tablet Take 1 tablet (25 mg total) by mouth 3 (three) times daily. Patient not taking: Reported on 08/18/2020 04/25/20   Roxan Hockey, MD  hydrOXYzine (ATARAX/VISTARIL) 25 MG tablet Take 1 tablet (25 mg total) by mouth every 8 (eight) hours as needed for itching. Patient not taking: Reported on 08/18/2020 07/19/20   Raiford Noble Latif, DO  Nutritional Supplements (FEEDING SUPPLEMENT, NEPRO CARB STEADY,) LIQD Take 237 mLs by mouth 3 (three) times daily as needed (Supplement). Patient not taking: Reported on 08/18/2020 07/19/20   Raiford Noble Latif, DO    Allergies    Penicillins, Chicken allergy, Contrast media [iodinated diagnostic agents], Eggs or egg-derived products, Milk-related compounds, Nickel, Oatmeal, Other, Vaccinium angustifolium, and Wheat bran  Review of Systems   Review of Systems  Unable to perform ROS: Mental status change    Physical Exam Updated Vital Signs BP (!) 94/57   Pulse (!) 107   Temp (!) 97.5 F (36.4 C) (Axillary)   Resp 18   SpO2 99%   Physical Exam Vitals and nursing note reviewed. Exam  conducted with a chaperone present.  Constitutional:      Appearance: She is obese. She is ill-appearing.  HENT:     Head: Normocephalic and atraumatic.  Eyes:     General: No scleral icterus.    Conjunctiva/sclera: Conjunctivae normal.  Cardiovascular:     Rate and Rhythm: Normal rate and regular rhythm.     Pulses: Normal pulses.     Heart sounds: Normal heart sounds.     Comments: Peripheral pulses intact and symmetric. Pulmonary:     Comments: No significant increased work of breathing.  Rales bilaterally in lower lungs.  Moving air well.  Symmetric chest rise.  No accessory muscle use or distress.  Currently on NRB after found to be hypoxic.  Coughing intermittently. Abdominal:     Comments: Protuberant.  Nondistended.  No guarding.  No overlying skin changes.  Musculoskeletal:     Right lower leg: No edema.     Left lower leg: No edema.  Skin:    General: Skin is dry.     Capillary Refill: Capillary refill takes less than 2 seconds.  Neurological:     GCS: GCS eye subscore is 4. GCS verbal subscore is 5. GCS motor subscore is 6.     Comments: Patient moves all extremities symmetrically to painful stimuli.  Occasionally opens eyes spontaneously.  Will not follow verbal cues or commands.  Nonverbal.    Psychiatric:        Mood and Affect: Mood normal.        Behavior: Behavior normal.        Thought Content: Thought content normal.       ED Results / Procedures / Treatments   Labs (all labs ordered are listed, but only abnormal results are displayed) Labs Reviewed  CBC - Abnormal; Notable for the following components:      Result Value   WBC 12.0 (*)    RBC 3.52 (*)    Hemoglobin 9.1 (*)    HCT 30.3 (*)    MCH 25.9 (*)    RDW 16.0 (*)    All other components within normal limits  DIFFERENTIAL - Abnormal; Notable for the following components:   Neutro Abs 9.6 (*)    All other components within normal limits  COMPREHENSIVE METABOLIC PANEL - Abnormal; Notable for  the following components:   Glucose, Bld 340 (*)    Creatinine, Ser 2.42 (*)    Calcium 8.1 (*)    Total Protein 5.7 (*)    Albumin 2.5 (*)    GFR calc non Af Amer 20 (*)    GFR calc Af Amer 23 (*)    All other components within normal limits  LACTIC ACID, PLASMA - Abnormal; Notable for the following components:   Lactic Acid, Venous 2.1 (*)    All other components within normal limits  I-STAT CHEM 8, ED - Abnormal; Notable for the following components:   Creatinine, Ser 2.20 (*)    Glucose, Bld 335 (*)    Calcium, Ion 1.07 (*)    Hemoglobin 9.9 (*)    HCT 29.0 (*)    All other components within normal limits  I-STAT VENOUS BLOOD GAS, ED - Abnormal; Notable for the following components:   pCO2, Ven 43.8 (*)    pO2, Ven 62.0 (*)    Acid-Base Excess 3.0 (*)    Calcium, Ion 1.05 (*)    HCT 28.0 (*)    Hemoglobin 9.5 (*)    All other components within normal limits  SARS CORONAVIRUS 2 BY RT PCR (HOSPITAL ORDER, [place] [facility])  CULTURE, BLOOD (ROUTINE X 2)  CULTURE, BLOOD (ROUTINE X 2)  ETHANOL  PROTIME-INR  APTT  RAPID URINE DRUG SCREEN, HOSP PERFORMED  URINALYSIS, ROUTINE W REFLEX MICROSCOPIC  LACTIC ACID, PLASMA  HIV ANTIBODY (ROUTINE TESTING W REFLEX)  CBC  CREATININE, SERUM  MAGNESIUM  PHOSPHORUS  TSH  HEMOGLOBIN A1C    EKG EKG Interpretation  Date/Time:  Sunday August 18 2020 07:03:22 EDT Ventricular Rate:  75 PR Interval:    QRS Duration: 98 QT Interval:  464 QTC Calculation: 519 R Axis:   90 Text Interpretation: Sinus rhythm Probable left atrial enlargement Borderline right axis deviation Borderline repolarization abnormality Prolonged QT interval No STEMI Confirmed by Octaviano Glow [phone]) on 08/18/2020 7:35:15 AM   Radiology DG Chest Portable 1 View  Result Date: 08/18/2020 CLINICAL DATA:  Altered mental status and hypoxia EXAM: PORTABLE CHEST 1 VIEW COMPARISON:  June 11, 2020; Apr 17, 2020; September 05, 2019 FINDINGS:  Subtle increased opacity is noted in the right base. Lungs elsewhere clear. There is cardiomegaly with pulmonary vascularity normal. No adenopathy. Chondroid matrix opacity is noted in the proximal humerus on the left. IMPRESSION: Subtle increased opacity right base. Question developing pneumonia or possible small focus of aspiration. Lungs elsewhere clear. There is cardiomegaly with pulmonary vascularity normal. Question bone infarct versus enchondroma left proximal humerus. This finding was present on prior chest radiographic examinations. Electronically Signed   By: Lowella Grip III M.D.   On: 08/18/2020 08:07   CT HEAD CODE STROKE WO CONTRAST  Result Date: 08/18/2020 CLINICAL DATA:  Code stroke. Initial evaluation for unresponsiveness, left-sided weakness. EXAM: CT HEAD WITHOUT CONTRAST TECHNIQUE: Contiguous axial images were obtained from the base of the skull through the vertex without intravenous contrast. COMPARISON:  Prior head CT from 07/15/2020. FINDINGS: Brain: Atrophy with chronic small vessel ischemic disease. Few scatter remote lacunar infarcts present about the bilateral basal ganglia. Abnormal patchy multifocal hypodensity seen involving the cortical and subcortical aspects of both cerebral hemispheres, most pronounced posteriorly. Additional scattered patchy hypodensity within the bilateral cerebellar hemispheres. Suspected involvement of the deep gray nuclei as well. Findings are most suggestive of possible PRES. No definite large vessel territory infarct. No acute intracranial hemorrhage. No mass effect or midline shift. No hydrocephalus or extra-axial fluid collection. Vascular: No hyperdense vessel. Scattered vascular calcifications noted within the carotid siphons. Skull: Scalp soft tissues and calvarium within normal limits. Sinuses/Orbits: Globes and orbital soft tissues within normal limits. Mild scattered mucosal thickening noted within the ethmoidal air cells. Paranasal sinuses  are otherwise clear. Trace right mastoid effusion noted, of doubtful significance. Other: None. ASPECTS [facility] Stroke Program Early CT Score) - Ganglionic level infarction (caudate, lentiform nuclei, internal capsule, insula, M1-M3 cortex): 7 - Supraganglionic infarction (M4-M6 cortex): 3 Total score (0-10 with 10 being normal): 10 IMPRESSION: 1. Patchy multifocal hypodensity involving the bilateral cerebral and cerebellar hemispheres, most pronounced posteriorly. Findings most suggestive of PRES. Further evaluation with dedicated MRI would likely be helpful for further characterization. 2. ASPECTS is 10. 3. Underlying age-related cerebral atrophy with chronic microvascular ischemic disease, with a few remote lacunar infarcts about the bilateral basal ganglia. These results were communicated to Dr. Curly Shores at 6:58 amon 9/12/2021by text page via the [facility] messaging system. Electronically Signed   By: Jeannine Boga M.D.   On: 08/18/2020 07:02    Procedures .Critical Care Performed by: Corena Herter, PA-C Authorized by: Corena Herter, PA-C   Critical care provider statement:    Critical care time (minutes):  45   Critical care was necessary to treat or prevent imminent  or life-threatening deterioration of the following conditions:  CNS failure or compromise   Critical care was time spent personally by me on the following activities:  Discussions with consultants, evaluation of patient's response to treatment, examination of patient, ordering and performing treatments and interventions, ordering and review of laboratory studies, ordering and review of radiographic studies, pulse oximetry, re-evaluation of patient's condition, obtaining history from patient or surrogate, review of old charts, blood draw for specimens and development of treatment plan with patient or surrogate Comments:     PRES. Hypoxia on NRB.    (including critical care time)  Medications Ordered in ED Medications    sodium chloride 0.9 % bolus 1,000 mL (has no administration in time range)  0.9 %  sodium chloride infusion (has no administration in time range)  heparin injection 5,000 Units (has no administration in time range)  acetaminophen (TYLENOL) tablet 650 mg (has no administration in time range)    Or  acetaminophen (TYLENOL) suppository 650 mg (has no administration in time range)  ondansetron (ZOFRAN) tablet 4 mg (has no administration in time range)    Or  ondansetron (ZOFRAN) injection 4 mg (has no administration in time range)  albuterol (PROVENTIL) (2.5 MG/3ML) 0.083% nebulizer solution 2.5 mg (has no administration in time range)  insulin aspart (novoLOG) injection 0-5 Units (has no administration in time range)  insulin aspart (novoLOG) injection 0-9 Units (has no administration in time range)  hydrALAZINE (APRESOLINE) injection 10 mg (10 mg Intravenous Given 08/18/20 0739)    ED Course  I have reviewed the triage vital signs and the nursing notes.  Pertinent labs & imaging results that were available during my care of the patient were reviewed by me and considered in my medical decision making (see chart for details).  Clinical Course as of Aug 18 946  Sun Aug 18, 2020  0735 69 yo female presenting from nursing facility with AMS.  She is DNR/DNI.  Here she has diminished GCS (Eyes 3, Verbal 1, Motor 4). Had reported vomiting at her facility, and here is somewhat hypoxic requiring nonrebreather mask, maintaining o2 sats with this.  Pt arrived as a code stroke due to "flacid weakness" of the right side per EMS, but here does not appear to have unilateral deficits, although she provides very poor effort bilaterally with any testing.  Xray appears to show RLL consolidation, likely aspiration.  [place] concerning for findings of PRES.  IV hydralazine ordered, neurology consulted, awaiting labs.  PA provider to obtain additional history from nursing facility.  Istat VBG shows no significant  acidosis or co2 retention.   [MT]  0901 I was notified of the patient may have either had a seizure or an episode of pulseless electrical activity.  Nursing staff reported the patient had generalized jerking and clonic activity of the upper and lower extremities, thereafter had a "fixed gaze and no pulse."  I was present with another patient and not immediately available, but another EDP passing by initiated CPR and gave 1 round of CODE epi prior to being notified of the patient's DNR status.  Patient subsequently had clear pulses.  Her mental status appears largely unchanged to me.  I clarified with the nursing that she is to absolutely be DNR/DNI, no CPR no code medications to be given henceforth.  We will admit her to the hospitalist, as the PA provider clarified with her sister that did not want any heroic measures which would include ICU care.  Neurology team wanted MRI brain.  Patient's BP now 101 systolic, we will start giving fluids   [MT]  0930 For aspiration PNA with severe PCN allergies, we can give 750 mg IV levaquin.   [MT]    Clinical Course User Index [MT] Wyvonnia Dusky, MD   MDM Rules/Calculators/A&P                          CT head without contrast demonstrates patchy multifocal hypodensity involving the bilateral cerebral and cerebellar hemispheres most pronounced posteriorly, concerning for PRES.  Will control patient's blood pressure with hydralazine before obtaining MRI for better delineation.    I spoke with Willeen Cass, her sister, who confirmed DNR/DNI and states that she simply wants patient to be made comfortable.  She specifically asked that no heroic measures or intubation.    April RN was in the room with patient when she suddenly began to convulse and some of her cardiac leads came off.  Patient became completely unresponsive and before DNR/DNI was confirmed with family patient was receiving compressions.  Peripheral pulses were difficult to assess by staff at  that time.  By the time I arrived to the room, I asked that compressions be ceased and ROSC ensued.  Will hold off on any extreme measures, per DNR/DNI and request of family.    DG chest is reviewed and demonstrates a subtle increased opacity in the right base concerning for possible developing pneumonia versus aspiration.  Patient has required suction here in the ED.  Concern for aspiration pneumonia and patient would likely benefit from IV Zosyn.  Suspect that she may have had seizure in setting of her suspected PRES.  MRI will need to be obtained, but waiting until BP stabilizes.  Will consult hospitalist for admission.  Will not consult critical care given patient's code status.    I spoke with Dr. Doristine Bosworth who will see and admit patient.  We wanted to start patient on Unasyn to cover for anaerobes in the context of aspiration pneumonia, however she has a severe penicillin allergy.  Started patient on Levaquin instead.  Final Clinical Impression(s) / ED Diagnoses Final diagnoses:  Altered mental status, unspecified altered mental status type  Aspiration pneumonia of right lower lobe, unspecified aspiration pneumonia type [facility])    Rx / DC Orders ED Discharge Orders    None       Corena Herter, PA-C 08/18/20 7510    Wyvonnia Dusky, MD 08/18/20 [phone]

## 2020-08-18 NOTE — ED Triage Notes (Signed)
Pt comes via [facility] from [facility], LSN at 9pm, found this morning unresponsive, vomited on self, stats in the 80's on RA, pt became responsive to painful stimuli, on NRB, flaccid R side.

## 2020-08-19 DIAGNOSIS — J690 Pneumonitis due to inhalation of food and vomit: Secondary | ICD-10-CM

## 2020-08-19 DIAGNOSIS — J9621 Acute and chronic respiratory failure with hypoxia: Secondary | ICD-10-CM

## 2020-08-19 DIAGNOSIS — N185 Chronic kidney disease, stage 5: Secondary | ICD-10-CM

## 2020-08-19 DIAGNOSIS — Z515 Encounter for palliative care: Secondary | ICD-10-CM

## 2020-08-19 DIAGNOSIS — D631 Anemia in chronic kidney disease: Secondary | ICD-10-CM

## 2020-08-19 DIAGNOSIS — F0151 Vascular dementia with behavioral disturbance: Secondary | ICD-10-CM

## 2020-08-19 LAB — BLOOD CULTURE ID PANEL (REFLEXED) - BCID2

## 2020-08-19 LAB — GLUCOSE, CAPILLARY: Glucose-Capillary: 314 mg/dL — ABNORMAL HIGH (ref 70–99)

## 2020-08-19 MED ORDER — LORAZEPAM 1 MG PO TABS: 1.0000 mg | ORAL_TABLET | ORAL | 0 refills | Status: AC | PRN

## 2020-08-19 MED ORDER — HALOPERIDOL 2 MG PO TABS: 2.0000 mg | ORAL_TABLET | Freq: Four times a day (QID) | ORAL | Status: AC | PRN

## 2020-08-19 MED ORDER — ONDANSETRON 4 MG PO TBDP: 4.0000 mg | ORAL_TABLET | Freq: Four times a day (QID) | ORAL | 0 refills | Status: AC | PRN

## 2020-08-19 MED ORDER — ALBUTEROL SULFATE (2.5 MG/3ML) 0.083% IN NEBU: 2.5000 mg | INHALATION_SOLUTION | RESPIRATORY_TRACT | 12 refills | Status: AC | PRN

## 2020-08-19 MED ORDER — GLYCOPYRROLATE 1 MG PO TABS: 1.0000 mg | ORAL_TABLET | ORAL | Status: AC | PRN

## 2020-08-19 NOTE — Progress Notes (Signed)
Daily Progress Note   Patient Name: Victoria Winters       Date: 08/19/2020 DOB: 1951/03/09  Age: 69 y.o. MRN#: [id number] Attending Physician: Donne Hazel, MD Primary Care Physician: Alfonse Flavors, MD Admit Date: 08/18/2020  Reason for Follow-up: Terminal Care, symptom check  Subjective: Patient appears comfortable. She opens her eyes to voice. She denies pain.  Note from [facility] liaison this morning states patient will have a bed at [facility] today.    Physical Exam        Neuro: responsive to voice and light touch, oriented to self only Pulmonary: Respirations even and unlabored Cardiac: regular rate and rhythm    Length of Stay: 1  PRN Meds: acetaminophen **OR** acetaminophen, albuterol, fentaNYL (SUBLIMAZE) injection, glycopyrrolate **OR** glycopyrrolate **OR** glycopyrrolate, haloperidol **OR** haloperidol **OR** haloperidol lactate, LORazepam **OR** LORazepam **OR** LORazepam, [DISCONTINUED] ondansetron **OR** ondansetron (ZOFRAN) IV, ondansetron **OR** ondansetron (ZOFRAN) IV, polyvinyl alcohol          Vital Signs: BP (!) 147/62 (BP Location: Right Arm)   Pulse 65   Temp 98.9 F (37.2 C) (Axillary)   Resp 15   SpO2 100%  SpO2: SpO2: 100 %       Palliative Assessment/Data: PPS 10%     Palliative Care Assessment & Plan   HPI/Patient Profile: 69 y.o. female  with past medical history of CKD stage IV, stroke, dementia, brain aneurism was seen in the ED on 08/18/20 for altered mental status and aspiration pneumonia of right lower lobe.   Per H&P--> ED Course:Upon arrival to ED: Patient tachycardic, blood pressure elevated, initially requiring nonrebreather mask, afebrile CBC shows leukocytosis of 12, normocytic anemia at baseline, CMP shows CKD stage IV at  baseline, lactic acid: 2.1, ethanol, PT/INR: WNL, COVID-19, UA, UDS, blood culture: Pending. Chest x-ray concerning for aspiration pneumonia, CT head concerning for PRES.EDP consulted neurology who recommended MRI. Patient was given hydralazine 10 mg IV once-her blood pressure trended down in 37T systolic. She was given IV fluid bolus after that. [facility] hospitalist consulted for admission due to acute metabolic encephalopathy.  In ED: Patient had seizure-like activity-she was noted to be pulseless therefore 1 round of chest compressions performed in ED. Her CODE STATUS was confirmed DNR/DNI. Patient's subsequently had pulses. Family did not want heroic measures which include ICU care. Neurology was  consulted for AMS and seizure like activity.  Of note, the patient has had two recent admissions to the hospital:  7/6-06/14/20 for fracture of tibia and fibula post fall 8/9-8/13/21 for AKI on CKD - stated patient was not a candidate for dialysis at this time   Assessment: - acute encephalopathy - aspiration pneumonia - acute on chronic hypoxemic respiratory failure secondary to aspiration PNA - vascular dementia with behavioral disturbance - CKD stage IV - chronic diastolic CHF - terminal care  Recommendations/Plan: - continue full comfort care   Code Status: DNR/DNI  Prognosis:   < 2 weeks  Discharge Planning:  Hospice facility [facility])   Thank you for allowing the Palliative Medicine Team to assist in the care of this patient.   Total Time 15 minutes Prolonged Time Billed  no       Greater than 50%  of this time was spent counseling and coordinating care related to the above assessment and plan.  Lavena Bullion, NP  Please contact Palliative Medicine Team phone at [phone] for questions and concerns.

## 2020-08-19 NOTE — Progress Notes (Signed)
Patient discharged to [facility] via [facility] transporter as ordered, report given to nurse Colletta Maryland at [facility]. Patient's sister was notified about her transfer.

## 2020-08-19 NOTE — Progress Notes (Addendum)
[facility] has a bed for Ms. Victoria Winters today. Spoke with sister Victoria Winters, confirms interest.  Once consents are complete, transport can be arranged.  [facility] will update TOC once all is ready.  RN staff, please call report at any time to [phone].  Room is assigned at that time.  Please fax dc summary to [phone].  Please call sister Victoria Winters to let her know once transport has arrived, family is out of town and request to be notified.  Venia Carbon RN, BSN, [facility] Liaison   **all necessary paperwork is completed, transport can be arranged.  TOC aware.

## 2020-08-19 NOTE — Discharge Summary (Signed)
Physician Discharge Summary  Victoria Winters AOZ:[id number] DOB: 11-17-1951 DOA: 08/18/2020  PCP: Alfonse Flavors, MD  Admit date: 08/18/2020 Discharge date: 08/19/2020  [facility] Disposition:  [facility]  Recommendations for Outpatient Follow-up:  1. Follow up with hospice services  Discharge Condition:Stable CODE STATUS:DNR, comfort Diet recommendation: comfort   Brief/Interim Summary: 69 y.o. female with medical history significant of hypertension, hyperlipidemia, diabetes mellitus, CKD stage IV, dementia, chronic diastolic CHF, distal left tibia and fibula fracture status post ORIF on 7/6, CVA, chronic hypoxemic respiratory failure-on 2 L of oxygen at baseline, obesity brought by EMS to the emergency department for evaluation of altered mental status.  History is limited due to patient's mental status.  History gathered from charts.  As per EMS patient was in her normal state of health at 9 PM last night.  This morning staff noticed that patient looks pale and found to be nonresponsive, her pupils were dilated and she had a bilious vomitus when the staff was trying to check her vitals, heart oxygen saturation was noted to be in 80s on room air-she was placed on nonrebreather and brought to the ER for further evaluation and management.  At baseline: Patient is nonambulatory, incontinent of bowel and bladder.  She admitted twice in last 2 months with acute comminuted fracture of distal left tibia and fibula on 7/6 and AKI on CKD on 8/9.  ED Course: Upon arrival to ED: Patient tachycardic, blood pressure elevated, initially requiring nonrebreather mask, afebrile CBC shows leukocytosis of 12, normocytic anemia at baseline, CMP shows CKD stage IV at baseline, lactic acid: 2.1, ethanol, PT/INR: WNL, COVID-19, UA, UDS, blood culture: Pending.  Chest x-ray concerning for aspiration pneumonia, CT head concerning for PRES. EDP consulted neurology who recommended  MRI.  Patient was given hydralazine 10 mg IV once-her blood pressure trended down in 96E systolic.  She was given IV fluid bolus after that.   [facility] hospitalist consulted for admission due to acute metabolic encephalopathy.  In ED: Patient had seizure-like activity-she was noted to be pulseless therefore 1 round of chest compressions performed in ED.  Her CODE STATUS was confirmed DNR/DNI.  Patient's subsequently had pulses.  Family did not want heroic measures which include ICU care.  Discharge Diagnoses:  Principal Problem:   Acute encephalopathy Active Problems:   CKD (chronic kidney disease) IV   DM (diabetes mellitus) (HCC)   CVA (cerebral vascular accident) ([place])   Acute and chronic respiratory failure with hypoxia (HCC)   Anemia in chronic kidney disease (CKD)   Vascular dementia with behavior disturbance (HCC)   Aspiration pneumonia (HCC)   Acute metabolic encephalopathy   DNI (do not intubate)   Terminal care   Encounter for hospice care discussion   Acute toxicmetabolic encephalopathy: -presenting with findings worrisome for PRES vs stroke vs sepsis secondary to pneumonia -Patient presented tachycardic, hypertensive, leukocytosis of 12.0, lactic acid of 2.1, reviewed chest x-ray.  COVID-19 neg -On discussion with family at time of presentation, family reported concerns of progressive decline following recent recurrent admissions -Code status was confirmed to be DNR and patient was placed on comfort only measures -Plan for d/c to [facility] today  Seizure-like activity: -was seen by Neurology, now focus on comfort only  Acute on chronic hypoxemic respiratory failure: Secondary to aspiration pneumonia: She is back to her baseline-on 2 L of oxygen via nasal cannula. -Patient had a vomitus this morning as per EMS report.  Reviewed chest x-ray.  Leukocytosis of  12.0, lactic acid: 2.1 -Now comfort care. Would allow comfort feeds as tolerated  CKD stage IV: At  baseline  Hypertension: Labile -She has history of adrenal insufficiency and on chronic steroids. -Patient hypertensive upon arrival.  Received hydralazine 10 mg IV once in the ED.  Her blood pressure dropped in 11H systolic.  She received IV fluids in ED.  Her blood pressure improved. -now focus on comfort care  Diabetes mellitus: Uncontrolled.  Last A1c checked in July which was 8.1%. -comfort care now  Anemia of chronic disease: Likely secondary to underlying CKD stage IV.  H&H noted to be stable  Vascular dementia with behavioral disturbances:  Fracture of distal left tibia and fibula: -Status post ORIF on 7/6  Chronic diastolic CHF: Patient appears euvolemic on exam. -Echo from 5/21 showed preserved ejection fraction with grade 2 diastolic dysfunction. -Held diuretics for now.   Adrenal insufficiency: -Patient blood pressure improves with IV fluids. -On Florinef-hold for now.  Morbid obesity with BMI of 40   Discharge Instructions   Allergies as of 08/19/2020      Reactions   Penicillins Anaphylaxis   Did it involve swelling of the face/tongue/throat, SOB, or low BP? Yes Did it involve sudden or severe rash/hives, skin peeling, or any reaction on the inside of your mouth or nose? Unknown Did you need to seek medical attention at a hospital or doctor's office? Unknown When did it last happen?Within past 10 years If all above answers are "NO", may proceed with cephalosporin use.   Chicken Allergy Other (See Comments)   unk on mar   Contrast Media [iodinated Diagnostic Agents] Other (See Comments)   unk on mar   Eggs Or Egg-derived Products Swelling   Patient is allergic to egg yolk, but can eat egg white with no problems   Milk-related Compounds Other (See Comments)   unk on mar   Nickel Other (See Comments)   unk on mar   Oatmeal Other (See Comments)   unk on mar   Other    Black beans, blueberry, cats, dogs, grass clippings   Vaccinium Angustifolium  Hives   Wheat Bran Other (See Comments)   unk on mar      Medication List    STOP taking these medications   aspirin EC 81 MG tablet   atorvastatin 20 MG tablet Commonly known as: LIPITOR   cetirizine 10 MG tablet Commonly known as: ZYRTEC   EPINEPHrine 0.3 mg/0.3 mL Soaj injection Commonly known as: EPI-PEN   feeding supplement (NEPRO CARB STEADY) Liqd   FLEET [employer]UR[employer] CLEANSING ENEMA RE   hydrALAZINE 25 MG tablet Commonly known as: APRESOLINE   insulin glargine 100 UNIT/ML injection Commonly known as: LANTUS   insulin lispro 100 UNIT/ML injection Commonly known as: HUMALOG   linagliptin 5 MG Tabs tablet Commonly known as: TRADJENTA   Linzess 145 MCG Caps capsule Generic drug: linaclotide   melatonin 5 MG Tabs   midodrine 5 MG tablet Commonly known as: PROAMATINE   multivitamin with minerals Tabs tablet   ondansetron 4 MG tablet Commonly known as: ZOFRAN   polyethylene glycol 17 g packet Commonly known as: MIRALAX / GLYCOLAX   potassium chloride SA 20 MEQ tablet Commonly known as: KLOR-CON   senna-docusate 8.6-50 MG tablet Commonly known as: Senokot-S   simethicone 80 MG chewable tablet Commonly known as: MYLICON   sorbitol 70 % Soln     TAKE these medications   acetaminophen 325 MG tablet Commonly known as: TYLENOL Take 2  tablets (650 mg total) by mouth every 6 (six) hours as needed for mild pain (or Fever >/= 101).   albuterol (2.5 MG/3ML) 0.083% nebulizer solution Commonly known as: PROVENTIL Take 3 mLs (2.5 mg total) by nebulization every 2 (two) hours as needed for wheezing.   bisacodyl 10 MG suppository Commonly known as: DULCOLAX Place 10 mg rectally at bedtime.   camphor-menthol lotion Commonly known as: SARNA Apply 1 application topically every 8 (eight) hours as needed for itching.   escitalopram 10 MG tablet Commonly known as: LEXAPRO Take 10 mg by mouth every morning.   fludrocortisone 0.1 MG tablet Commonly known as:  FLORINEF Take 0.2 mg by mouth daily.   glycopyrrolate 1 MG tablet Commonly known as: ROBINUL Take 1 tablet (1 mg total) by mouth every 4 (four) hours as needed (excessive secretions).   haloperidol 2 MG tablet Commonly known as: HALDOL Take 1 tablet (2 mg total) by mouth every 6 (six) hours as needed for agitation (or delirium).   hydrOXYzine 25 MG tablet Commonly known as: ATARAX/VISTARIL Take 1 tablet (25 mg total) by mouth every 8 (eight) hours as needed for itching.   lactulose 10 GM/15ML solution Commonly known as: CHRONULAC Take 20 g by mouth at bedtime.   LORazepam 1 MG tablet Commonly known as: ATIVAN Take 1 tablet (1 mg total) by mouth every hour as needed for anxiety or seizure.   metoprolol succinate 50 MG 24 hr tablet Commonly known as: TOPROL-XL Take 1 tablet (50 mg total) by mouth every morning.   ondansetron 4 MG disintegrating tablet Commonly known as: ZOFRAN-ODT Take 1 tablet (4 mg total) by mouth every 6 (six) hours as needed for nausea.   oxyCODONE 5 MG immediate release tablet Commonly known as: Oxy IR/ROXICODONE Take 1 tablet (5 mg total) by mouth every 4 (four) hours as needed for severe pain.   OXYGEN Inhale 2 L/min into the lungs continuous as needed (shortness of breath/desaturation).   pregabalin 75 MG capsule Commonly known as: LYRICA Take 75 mg by mouth 2 (two) times daily.   QUEtiapine 25 MG tablet Commonly known as: SEROQUEL Take 1 tablet (25 mg total) by mouth at bedtime.   torsemide 20 MG tablet Commonly known as: DEMADEX Take 3 tablets (60 mg total) by mouth daily. Start 8/15       Follow-up Information    Follow up with hospice as needed Follow up.              Allergies  Allergen Reactions  . Penicillins Anaphylaxis    Did it involve swelling of the face/tongue/throat, SOB, or low BP? Yes Did it involve sudden or severe rash/hives, skin peeling, or any reaction on the inside of your mouth or nose? Unknown Did you need  to seek medical attention at a hospital or doctor's office? Unknown When did it last happen?Within past 10 years If all above answers are "NO", may proceed with cephalosporin use.   . Chicken Allergy Other (See Comments)    unk on mar  . Contrast Media [Iodinated Diagnostic Agents] Other (See Comments)    unk on mar  . Eggs Or Egg-Derived Products Swelling    Patient is allergic to egg yolk, but can eat egg white with no problems  . Milk-Related Compounds Other (See Comments)    unk on mar  . Nickel Other (See Comments)    unk on mar  . Oatmeal Other (See Comments)    unk on mar  . Other  Black beans, blueberry, cats, dogs, grass clippings  . Vaccinium Angustifolium Hives  . Wheat Bran Other (See Comments)    unk on mar    Consultations:  Palliative Care  Procedures/Studies: DG Chest Portable 1 View  Result Date: 08/18/2020 CLINICAL DATA:  Altered mental status and hypoxia EXAM: PORTABLE CHEST 1 VIEW COMPARISON:  June 11, 2020; Apr 17, 2020; September 05, 2019 FINDINGS: Subtle increased opacity is noted in the right base. Lungs elsewhere clear. There is cardiomegaly with pulmonary vascularity normal. No adenopathy. Chondroid matrix opacity is noted in the proximal humerus on the left. IMPRESSION: Subtle increased opacity right base. Question developing pneumonia or possible small focus of aspiration. Lungs elsewhere clear. There is cardiomegaly with pulmonary vascularity normal. Question bone infarct versus enchondroma left proximal humerus. This finding was present on prior chest radiographic examinations. Electronically Signed   By: Lowella Grip III M.D.   On: 08/18/2020 08:07   CT HEAD CODE STROKE WO CONTRAST  Result Date: 08/18/2020 CLINICAL DATA:  Code stroke. Initial evaluation for unresponsiveness, left-sided weakness. EXAM: CT HEAD WITHOUT CONTRAST TECHNIQUE: Contiguous axial images were obtained from the base of the skull through the vertex without intravenous  contrast. COMPARISON:  Prior head CT from 07/15/2020. FINDINGS: Brain: Atrophy with chronic small vessel ischemic disease. Few scatter remote lacunar infarcts present about the bilateral basal ganglia. Abnormal patchy multifocal hypodensity seen involving the cortical and subcortical aspects of both cerebral hemispheres, most pronounced posteriorly. Additional scattered patchy hypodensity within the bilateral cerebellar hemispheres. Suspected involvement of the deep gray nuclei as well. Findings are most suggestive of possible PRES. No definite large vessel territory infarct. No acute intracranial hemorrhage. No mass effect or midline shift. No hydrocephalus or extra-axial fluid collection. Vascular: No hyperdense vessel. Scattered vascular calcifications noted within the carotid siphons. Skull: Scalp soft tissues and calvarium within normal limits. Sinuses/Orbits: Globes and orbital soft tissues within normal limits. Mild scattered mucosal thickening noted within the ethmoidal air cells. Paranasal sinuses are otherwise clear. Trace right mastoid effusion noted, of doubtful significance. Other: None. ASPECTS [facility] Stroke Program Early CT Score) - Ganglionic level infarction (caudate, lentiform nuclei, internal capsule, insula, M1-M3 cortex): 7 - Supraganglionic infarction (M4-M6 cortex): 3 Total score (0-10 with 10 being normal): 10 IMPRESSION: 1. Patchy multifocal hypodensity involving the bilateral cerebral and cerebellar hemispheres, most pronounced posteriorly. Findings most suggestive of PRES. Further evaluation with dedicated MRI would likely be helpful for further characterization. 2. ASPECTS is 10. 3. Underlying age-related cerebral atrophy with chronic microvascular ischemic disease, with a few remote lacunar infarcts about the bilateral basal ganglia. These results were communicated to Dr. Curly Shores at 6:58 amon 9/12/2021by text page via the [facility] messaging system. Electronically Signed   By: Jeannine Boga M.D.   On: 08/18/2020 07:02     Subjective: Confused this AM  Discharge Exam: Vitals:   08/18/20 1452 08/19/20 0437  BP: (!) 150/58 (!) 147/62  Pulse: 72 65  Resp: 14 15  Temp:  98.9 F (37.2 C)  SpO2: 100% 100%   Vitals:   08/18/20 1400 08/18/20 1449 08/18/20 1452 08/19/20 0437  BP: (!) 156/65 (!) 153/64 (!) 150/58 (!) 147/62  Pulse: 71 84 72 65  Resp: 19 20 14 15   Temp:  98.4 F (36.9 C)  98.9 F (37.2 C)  TempSrc:  Oral  Axillary  SpO2: 100% (!) 85% 100% 100%    General: Pt , awake, not in acute distress Cardiovascular: RRR, S1/S2  Respiratory: normal resp effort, no audible  wheezing Abdominal: Soft, NT, ND, bowel sounds + Extremities: no edema, no cyanosis   The results of significant diagnostics from this hospitalization (including imaging, microbiology, ancillary and laboratory) are listed below for reference.     Microbiology: Recent Results (from the past 240 hour(s))  SARS Coronavirus 2 by RT PCR (hospital order, performed in [facility] hospital lab) Nasopharyngeal Nasopharyngeal Swab     Status: None   Collection Time: 08/18/20  7:03 AM   Specimen: Nasopharyngeal Swab  Result Value Ref Range Status   SARS Coronavirus 2 NEGATIVE NEGATIVE Final    Comment: (NOTE) SARS-CoV-2 target nucleic acids are NOT DETECTED.  The SARS-CoV-2 RNA is generally detectable in upper and lower respiratory specimens during the acute phase of infection. The lowest concentration of SARS-CoV-2 viral copies this assay can detect is 250 copies / mL. A negative result does not preclude SARS-CoV-2 infection and should not be used as the sole basis for treatment or other patient management decisions.  A negative result may occur with improper specimen collection / handling, submission of specimen other than nasopharyngeal swab, presence of viral mutation(s) within the areas targeted by this assay, and inadequate number of viral copies (<250 copies / mL). A negative  result must be combined with clinical observations, patient history, and epidemiological information.  Fact Sheet for Patients:   StrictlyIdeas.no  Fact Sheet for Healthcare Providers: BankingDealers.co.za  This test is not yet approved or  cleared by the [place] FDA and has been authorized for detection and/or diagnosis of SARS-CoV-2 by FDA under an Emergency Use Authorization (EUA).  This EUA will remain in effect (meaning this test can be used) for the duration of the COVID-19 declaration under Section 564(b)(1) of the Act, 21 U.S.C. section 360bbb-3(b)(1), unless the authorization is terminated or revoked sooner.  Performed at [facility], [place] [address] [place], [place] [address]   Blood culture (routine x 2)     Status: None (Preliminary result)   Collection Time: 08/18/20  7:50 AM   Specimen: BLOOD RIGHT FOREARM  Result Value Ref Range Status   Specimen Description BLOOD RIGHT FOREARM  Final   Special Requests   Final    BOTTLES DRAWN AEROBIC AND ANAEROBIC Blood Culture results may not be optimal due to an inadequate volume of blood received in culture bottles   Culture   Final    NO GROWTH < 12 HOURS Performed at [facility], [place] [address], [place], [place] [address]    Report Status PENDING  Incomplete  Blood culture (routine x 2)     Status: None (Preliminary result)   Collection Time: 08/18/20  7:52 AM   Specimen: BLOOD LEFT FOREARM  Result Value Ref Range Status   Specimen Description BLOOD LEFT FOREARM  Final   Special Requests   Final    BOTTLES DRAWN AEROBIC AND ANAEROBIC Blood Culture results may not be optimal due to an inadequate volume of blood received in culture bottles   Culture  Setup Time   Final    GRAM POSITIVE COCCI IN CLUSTERS ANAEROBIC BOTTLE ONLY Organism ID to follow CRITICAL RESULT CALLED TO, READ BACK BY AND VERIFIED WITH: Salli Real [id number] 08/19/2020 Mena Goes Performed  at [facility], [place] [address] [place],  [address]    Culture [facility] POSITIVE COCCI  Final   Report Status PENDING  Incomplete  Blood Culture ID Panel (Reflexed)     Status: Abnormal   Collection Time: 08/18/20  7:52  AM  Result Value Ref Range Status   Enterococcus faecalis NOT DETECTED NOT DETECTED Final   Enterococcus Faecium NOT DETECTED NOT DETECTED Final   Listeria monocytogenes NOT DETECTED NOT DETECTED Final   Staphylococcus species DETECTED (A) NOT DETECTED Final    Comment: CRITICAL RESULT CALLED TO, READ BACK BY AND VERIFIED WITH: G. ABBOTT,PHARMD [id number] 08/19/2020 T. TYSOR    Staphylococcus aureus (BCID) NOT DETECTED NOT DETECTED Final   Staphylococcus epidermidis NOT DETECTED NOT DETECTED Final   Staphylococcus lugdunensis NOT DETECTED NOT DETECTED Final   Streptococcus species NOT DETECTED NOT DETECTED Final   Streptococcus agalactiae NOT DETECTED NOT DETECTED Final   Streptococcus pneumoniae NOT DETECTED NOT DETECTED Final   Streptococcus pyogenes NOT DETECTED NOT DETECTED Final   A.calcoaceticus-baumannii NOT DETECTED NOT DETECTED Final   Bacteroides fragilis NOT DETECTED NOT DETECTED Final   Enterobacterales NOT DETECTED NOT DETECTED Final   Enterobacter cloacae complex NOT DETECTED NOT DETECTED Final   Escherichia coli NOT DETECTED NOT DETECTED Final   Klebsiella aerogenes NOT DETECTED NOT DETECTED Final   Klebsiella oxytoca NOT DETECTED NOT DETECTED Final   Klebsiella pneumoniae NOT DETECTED NOT DETECTED Final   Proteus species NOT DETECTED NOT DETECTED Final   Salmonella species NOT DETECTED NOT DETECTED Final   Serratia marcescens NOT DETECTED NOT DETECTED Final   Haemophilus influenzae NOT DETECTED NOT DETECTED Final   Neisseria meningitidis NOT DETECTED NOT DETECTED Final   Pseudomonas aeruginosa NOT DETECTED NOT DETECTED Final   Stenotrophomonas maltophilia NOT DETECTED NOT DETECTED Final   Candida albicans NOT DETECTED NOT DETECTED Final    Candida auris NOT DETECTED NOT DETECTED Final   Candida glabrata NOT DETECTED NOT DETECTED Final   Candida krusei NOT DETECTED NOT DETECTED Final   Candida parapsilosis NOT DETECTED NOT DETECTED Final   Candida tropicalis NOT DETECTED NOT DETECTED Final   Cryptococcus neoformans/gattii NOT DETECTED NOT DETECTED Final    Comment: Performed at [facility], [address] [place], [place] [address]     Labs: BNP (last 3 results) Recent Labs    04/17/20 1039 04/22/20 0945  BNP 650.0* 409.8*   Basic Metabolic Panel: Recent Labs  Lab 08/18/20 0642 08/18/20 0650 08/18/20 0655  NA 142 142 142  K 4.2 4.1 4.2  CL 105 103  --   CO2 26  --   --   GLUCOSE 340* 335*  --   BUN 19 20  --   CREATININE 2.42* 2.20*  --   CALCIUM 8.1*  --   --    Liver Function Tests: Recent Labs  Lab 08/18/20 0642  AST 20  ALT 11  ALKPHOS 84  BILITOT 0.6  PROT 5.7*  ALBUMIN 2.5*   No results for input(s): LIPASE, AMYLASE in the last 168 hours. No results for input(s): AMMONIA in the last 168 hours. CBC: Recent Labs  Lab 08/18/20 0642 08/18/20 0650 08/18/20 0655  WBC 12.0*  --   --   NEUTROABS 9.6*  --   --   HGB 9.1* 9.9* 9.5*  HCT 30.3* 29.0* 28.0*  MCV 86.1  --   --   PLT 264  --   --    Cardiac Enzymes: No results for input(s): CKTOTAL, CKMB, CKMBINDEX, TROPONINI in the last 168 hours. BNP: Invalid input(s): POCBNP CBG: Recent Labs  Lab 08/18/20 0636  GLUCAP 314*   D-Dimer No results for input(s): DDIMER in the last 72 hours. Hgb A1c No results for input(s): HGBA1C in the last 72  hours. Lipid Profile No results for input(s): CHOL, HDL, LDLCALC, TRIG, CHOLHDL, LDLDIRECT in the last 72 hours. Thyroid function studies No results for input(s): TSH, T4TOTAL, T3FREE, THYROIDAB in the last 72 hours.  Invalid input(s): FREET3 Anemia work up No results for input(s): VITAMINB12, FOLATE, FERRITIN, TIBC, IRON, RETICCTPCT in the last 72 hours. Urinalysis    Component Value  Date/Time   COLORURINE YELLOW 07/16/2020 1438   APPEARANCEUR HAZY (A) 07/16/2020 1438   LABSPEC 1.010 07/16/2020 1438   PHURINE 5.0 07/16/2020 1438   GLUCOSEU 50 (A) 07/16/2020 1438   HGBUR NEGATIVE 07/16/2020 1438   [place] 07/16/2020 1438   KETONESUR NEGATIVE 07/16/2020 1438   PROTEINUR 100 (A) 07/16/2020 1438   NITRITE NEGATIVE 07/16/2020 1438   LEUKOCYTESUR NEGATIVE 07/16/2020 1438   Sepsis Labs Invalid input(s): PROCALCITONIN,  WBC,  LACTICIDVEN Microbiology Recent Results (from the past 240 hour(s))  SARS Coronavirus 2 by RT PCR (hospital order, performed in [place] hospital lab) Nasopharyngeal Nasopharyngeal Swab     Status: None   Collection Time: 08/18/20  7:03 AM   Specimen: Nasopharyngeal Swab  Result Value Ref Range Status   SARS Coronavirus 2 NEGATIVE NEGATIVE Final    Comment: (NOTE) SARS-CoV-2 target nucleic acids are NOT DETECTED.  The SARS-CoV-2 RNA is generally detectable in upper and lower respiratory specimens during the acute phase of infection. The lowest concentration of SARS-CoV-2 viral copies this assay can detect is 250 copies / mL. A negative result does not preclude SARS-CoV-2 infection and should not be used as the sole basis for treatment or other patient management decisions.  A negative result may occur with improper specimen collection / handling, submission of specimen other than nasopharyngeal swab, presence of viral mutation(s) within the areas targeted by this assay, and inadequate number of viral copies (<250 copies / mL). A negative result must be combined with clinical observations, patient history, and epidemiological information.  Fact Sheet for Patients:   StrictlyIdeas.no  Fact Sheet for Healthcare Providers: BankingDealers.co.za  This test is not yet approved or  cleared by the [place] FDA and has been authorized for detection and/or diagnosis of SARS-CoV-2  by FDA under an Emergency Use Authorization (EUA).  This EUA will remain in effect (meaning this test can be used) for the duration of the COVID-19 declaration under Section 564(b)(1) of the Act, 21 U.S.C. section 360bbb-3(b)(1), unless the authorization is terminated or revoked sooner.  Performed at [facility], [place] [address] [place], [place] [address]   Blood culture (routine x 2)     Status: None (Preliminary result)   Collection Time: 08/18/20  7:50 AM   Specimen: BLOOD RIGHT FOREARM  Result Value Ref Range Status   Specimen Description BLOOD RIGHT FOREARM  Final   Special Requests   Final    BOTTLES DRAWN AEROBIC AND ANAEROBIC Blood Culture results may not be optimal due to an inadequate volume of blood received in culture bottles   Culture   Final    NO GROWTH < 12 HOURS Performed at [facility], [place] [address], [place], [place] [address]    Report Status PENDING  Incomplete  Blood culture (routine x 2)     Status: None (Preliminary result)   Collection Time: 08/18/20  7:52 AM   Specimen: BLOOD LEFT FOREARM  Result Value Ref Range Status   Specimen Description BLOOD LEFT FOREARM  Final   Special Requests   Final    BOTTLES DRAWN AEROBIC AND ANAEROBIC Blood  Culture results may not be optimal due to an inadequate volume of blood received in culture bottles   Culture  Setup Time   Final    GRAM POSITIVE COCCI IN CLUSTERS ANAEROBIC BOTTLE ONLY Organism ID to follow CRITICAL RESULT CALLED TO, READ BACK BY AND VERIFIED WITH: Salli Real [id number] 08/19/2020 Mena Goes Performed at [facility], [place] [address], [place], [place] [address]    Culture GRAM POSITIVE COCCI  Final   Report Status PENDING  Incomplete  Blood Culture ID Panel (Reflexed)     Status: Abnormal   Collection Time: 08/18/20  7:52 AM  Result Value Ref Range Status   Enterococcus faecalis NOT DETECTED NOT DETECTED Final   Enterococcus Faecium NOT DETECTED NOT DETECTED Final   Listeria  monocytogenes NOT DETECTED NOT DETECTED Final   Staphylococcus species DETECTED (A) NOT DETECTED Final    Comment: CRITICAL RESULT CALLED TO, READ BACK BY AND VERIFIED WITH: G. ABBOTT,PHARMD [id number] 08/19/2020 T. TYSOR    Staphylococcus aureus (BCID) NOT DETECTED NOT DETECTED Final   Staphylococcus epidermidis NOT DETECTED NOT DETECTED Final   Staphylococcus lugdunensis NOT DETECTED NOT DETECTED Final   Streptococcus species NOT DETECTED NOT DETECTED Final   Streptococcus agalactiae NOT DETECTED NOT DETECTED Final   Streptococcus pneumoniae NOT DETECTED NOT DETECTED Final   Streptococcus pyogenes NOT DETECTED NOT DETECTED Final   A.calcoaceticus-baumannii NOT DETECTED NOT DETECTED Final   Bacteroides fragilis NOT DETECTED NOT DETECTED Final   Enterobacterales NOT DETECTED NOT DETECTED Final   Enterobacter cloacae complex NOT DETECTED NOT DETECTED Final   Escherichia coli NOT DETECTED NOT DETECTED Final   Klebsiella aerogenes NOT DETECTED NOT DETECTED Final   Klebsiella oxytoca NOT DETECTED NOT DETECTED Final   Klebsiella pneumoniae NOT DETECTED NOT DETECTED Final   Proteus species NOT DETECTED NOT DETECTED Final   Salmonella species NOT DETECTED NOT DETECTED Final   Serratia marcescens NOT DETECTED NOT DETECTED Final   Haemophilus influenzae NOT DETECTED NOT DETECTED Final   Neisseria meningitidis NOT DETECTED NOT DETECTED Final   Pseudomonas aeruginosa NOT DETECTED NOT DETECTED Final   Stenotrophomonas maltophilia NOT DETECTED NOT DETECTED Final   Candida albicans NOT DETECTED NOT DETECTED Final   Candida auris NOT DETECTED NOT DETECTED Final   Candida glabrata NOT DETECTED NOT DETECTED Final   Candida krusei NOT DETECTED NOT DETECTED Final   Candida parapsilosis NOT DETECTED NOT DETECTED Final   Candida tropicalis NOT DETECTED NOT DETECTED Final   Cryptococcus neoformans/gattii NOT DETECTED NOT DETECTED Final    Comment: Performed at [facility], [address],  [place], [place] [address]    Time spent: 30 min  SIGNED:   Marylu Lund, MD  [facility] 08/19/2020, 10:01 AM  If 7PM-7AM, please contact night-coverage

## 2020-08-19 NOTE — Progress Notes (Signed)
PHARMACY - PHYSICIAN COMMUNICATION CRITICAL VALUE ALERT - BLOOD CULTURE IDENTIFICATION (BCID)  Victoria Winters is an 69 y.o. female who presented to [facility] on 08/18/2020 with a chief complaint of AMS/seizures, now comfort care  Assessment:   1/2 blood cultures growing Staph species, likely contaminant Name of physician (or Provider) Contacted:  Jeannette Corpus  Current antibiotics: None  Changes to prescribed antibiotics recommended:  None  Results for orders placed or performed during the hospital encounter of 08/18/20  Blood Culture ID Panel (Reflexed) (Collected: 08/18/2020  7:52 AM)  Result Value Ref Range   Enterococcus faecalis NOT DETECTED NOT DETECTED   Enterococcus Faecium NOT DETECTED NOT DETECTED   Listeria monocytogenes NOT DETECTED NOT DETECTED   Staphylococcus species DETECTED (A) NOT DETECTED   Staphylococcus aureus (BCID) NOT DETECTED NOT DETECTED   Staphylococcus epidermidis NOT DETECTED NOT DETECTED   Staphylococcus lugdunensis NOT DETECTED NOT DETECTED   Streptococcus species NOT DETECTED NOT DETECTED   Streptococcus agalactiae NOT DETECTED NOT DETECTED   Streptococcus pneumoniae NOT DETECTED NOT DETECTED   Streptococcus pyogenes NOT DETECTED NOT DETECTED   A.calcoaceticus-baumannii NOT DETECTED NOT DETECTED   Bacteroides fragilis NOT DETECTED NOT DETECTED   Enterobacterales NOT DETECTED NOT DETECTED   Enterobacter cloacae complex NOT DETECTED NOT DETECTED   Escherichia coli NOT DETECTED NOT DETECTED   Klebsiella aerogenes NOT DETECTED NOT DETECTED   Klebsiella oxytoca NOT DETECTED NOT DETECTED   Klebsiella pneumoniae NOT DETECTED NOT DETECTED   Proteus species NOT DETECTED NOT DETECTED   Salmonella species NOT DETECTED NOT DETECTED   Serratia marcescens NOT DETECTED NOT DETECTED   Haemophilus influenzae NOT DETECTED NOT DETECTED   Neisseria meningitidis NOT DETECTED NOT DETECTED   Pseudomonas aeruginosa NOT DETECTED NOT DETECTED   Stenotrophomonas maltophilia NOT  DETECTED NOT DETECTED   Candida albicans NOT DETECTED NOT DETECTED   Candida auris NOT DETECTED NOT DETECTED   Candida glabrata NOT DETECTED NOT DETECTED   Candida krusei NOT DETECTED NOT DETECTED   Candida parapsilosis NOT DETECTED NOT DETECTED   Candida tropicalis NOT DETECTED NOT DETECTED   Cryptococcus neoformans/gattii NOT DETECTED NOT DETECTED    Caryl Pina 08/19/2020  6:20 AM

## 2020-08-19 NOTE — TOC Transition Note (Addendum)
Transition of Care [facility]) - CM/SW Discharge Note   Patient Details  Name: Shai Rasmussen MRN: [id number] Date of Birth: 01-12-1951  [facility] CM/SW Contact:  Alexander Mt, LCSW Phone Number: 08/19/2020, 11:11 AM   Clinical Narrative:    12:20pm- CSW called [facility] for 12:45pm, DNR on chart signed with [facility] papers. Passed on request to RN Remo Lipps to call pt sister when Corey Harold here since pt family is out of town.   11:11am- Plan for comfort care per hand off, spoke with [name] who does have a bed for pt today. She will contact pt family for consents. Pt discharge summary faxed to [facility]. I have asked MD for signed DNR. Await confirmation of consents to arrange [facility].    Final next level of care: [place] Barriers to Discharge: Barriers Resolved   Patient Goals and CMS Choice Patient states their goals for this hospitalization and ongoing recovery are:: comfort care   Choice offered to / list presented to : Adult Children  Discharge Placement    Patient chooses bed at: Other - please specify in the comment section below: (Ages) Patient to be transferred to facility by: [place] Name of family member notified: pt sister Patient and family notified of of transfer: 08/19/20  Readmission Risk Interventions Readmission Risk Prevention Plan 08/19/2020 07/18/2020 06/12/2020  Transportation Screening Not Complete Complete Complete  Transportation Screening Comment comfort care - -  PCP or Specialist Appt within 3-5 Days - - -  Not Complete comments - - -  Medication Review (RN CM) - - -  [place] or [place] or Home Care Consult comments - - -  Social Work Consult for [place] - - -  Medication Review ([place]) Referral to Pharmacy Referral to Pharmacy Complete  PCP or Specialist appointment within 3-5 days of discharge Not Complete Not Complete Complete   PCP/Specialist Appt Not Complete comments comfort care LTC SNF -  [place] or Home Care Consult Not Complete Not Complete Complete  [place] or Home Care Consult Pt Refusal Comments comfort care LTC SNF -  [facility] Recovery Care/Counseling Consult Complete Complete Complete  Palliative Care Screening Complete Complete Complete  Skilled Nursing Facility Not Applicable Complete Complete

## 2020-08-19 NOTE — Social Work (Signed)
Clinical Social Worker facilitated patient discharge including contacting patient family and facility to confirm patient discharge plans.  Clinical information faxed to facility and family agreeable with plan.  CSW arranged ambulance transport via [facility] to [facility].  RN to call [phone] with report prior to discharge.  Clinical Social Worker will sign off for now as social work intervention is no longer needed. Please consult us again if new need arises.  Churchill Grimsley, MSW, LCSW Clinical Social Worker   

## 2020-08-21 LAB — CULTURE, BLOOD (ROUTINE X 2)

## 2020-08-23 LAB — CULTURE, BLOOD (ROUTINE X 2): Culture: NO GROWTH

## 2020-09-26 IMAGING — CR PELVIS - 1-2 VIEW
1 series · 1 of 1 positions shown · non-contrast
Comparison: None.

CLINICAL DATA: 67-year-old female status post fall with pain.

EXAM:
PELVIS - 1-2 VIEW

[ap]
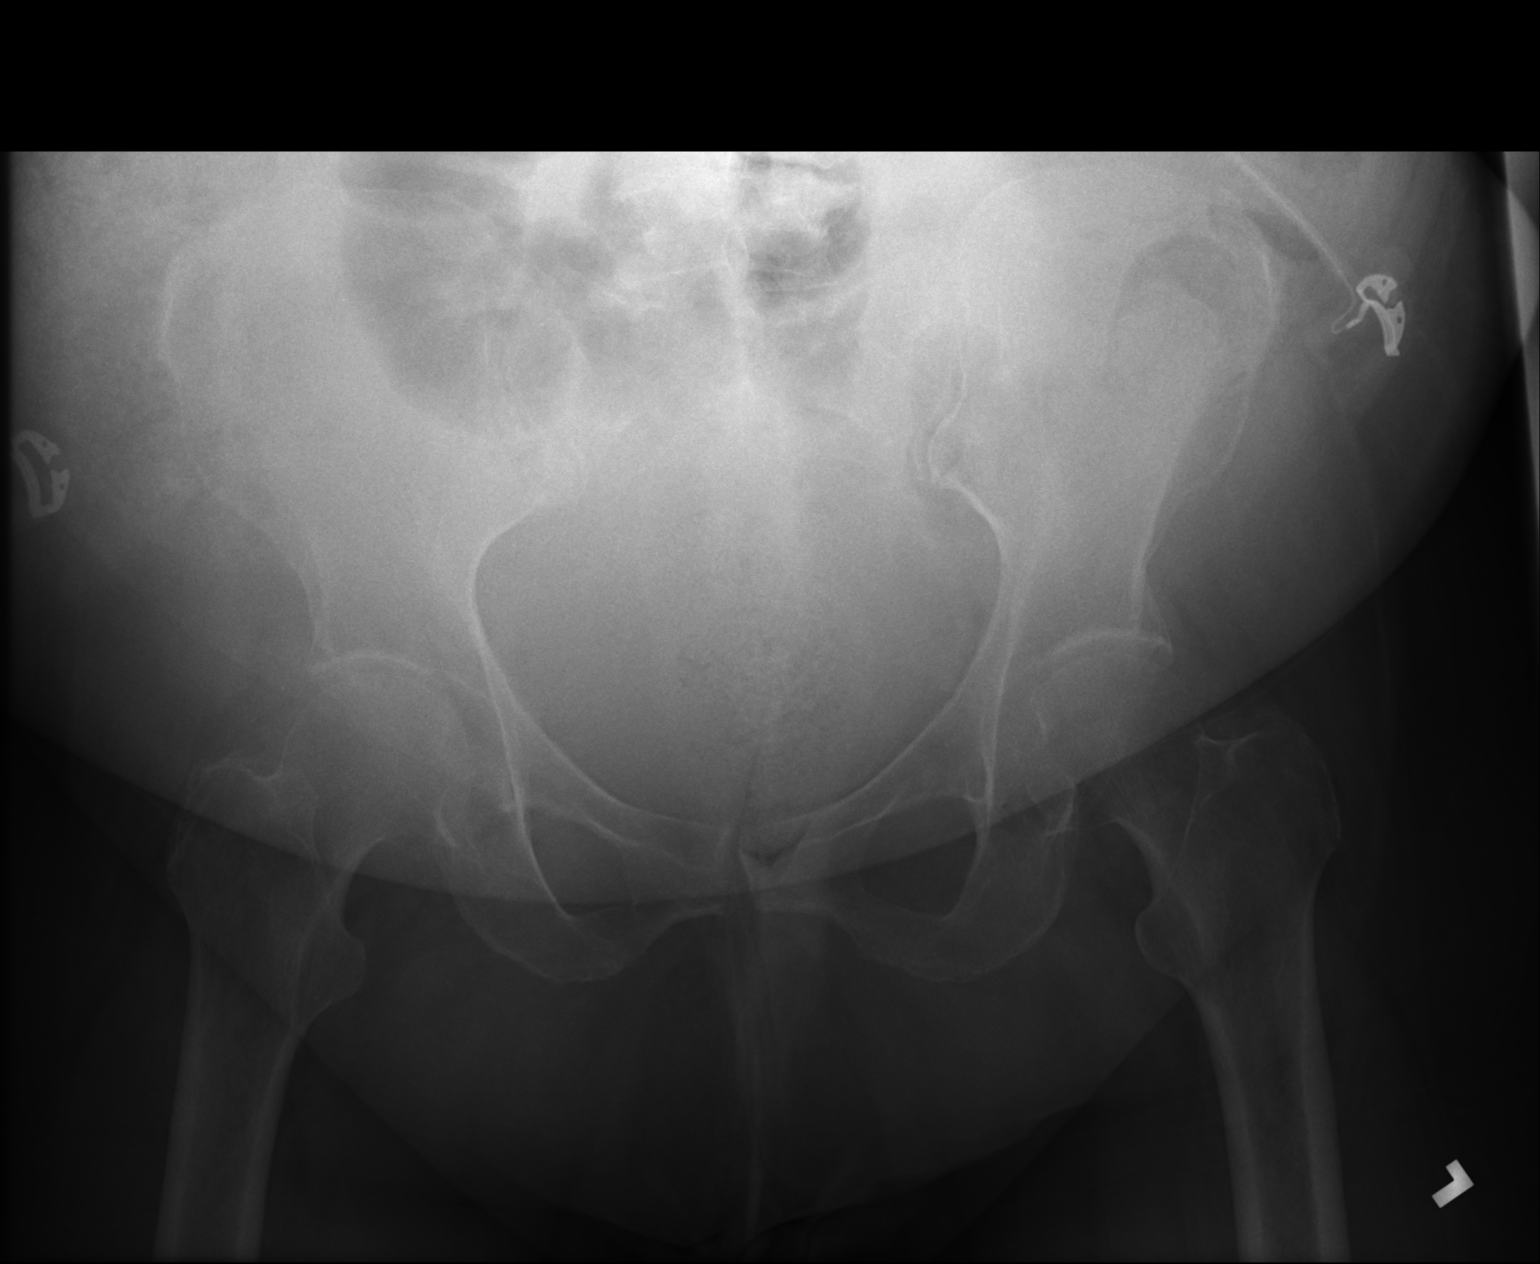

[1 of 1 positions shown; findings below may reference images not displayed]

FINDINGS: Femoral heads are normally located. Mild asymmetric right hip joint
space loss. Grossly intact proximal femurs. Upper pelvis bone detail
degraded by soft tissue artifact. No pelvis fracture identified. No
acute osseous abnormality identified. Visible bowel gas pattern is
within normal limits.
IMPRESSION: No acute fracture or dislocation identified about the pelvis.

## 2020-09-26 IMAGING — CT CT HEAD WITHOUT CONTRAST
4 series · 16 of 47 positions shown, 18 images · non-contrast
Comparison: None.

CLINICAL DATA: Ataxia, fall, history of strokes

EXAM:
CT HEAD WITHOUT CONTRAST
TECHNIQUE: Contiguous axial images were obtained from the base of the skull
through the vertex without intravenous contrast.

[Series 2: head w o · axial · 0.44mm/px · z∈[-75,+45]mm · 7 of 32 slices shown, 9 images]
[im 4/32  brain]
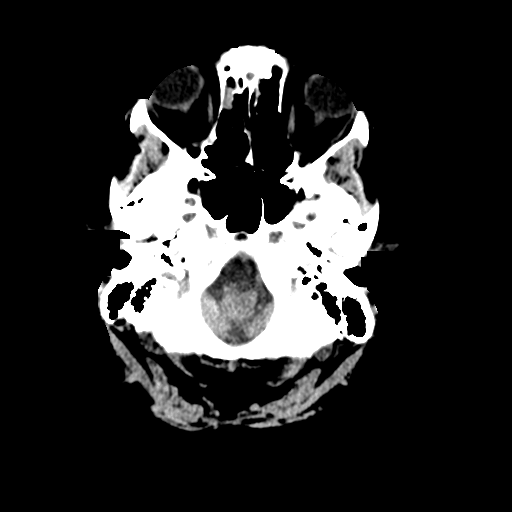
[im 4/32  bone]
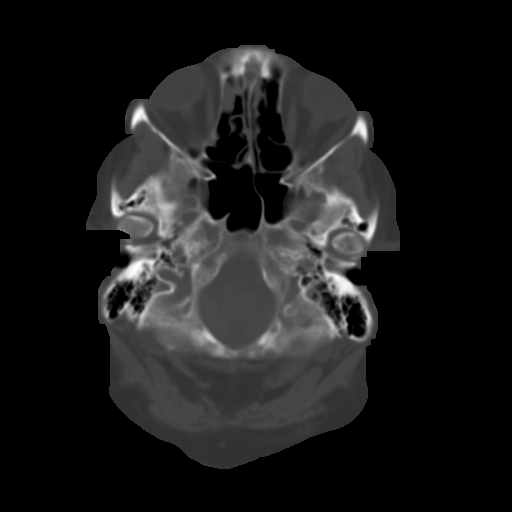
[im 8/32  brain]
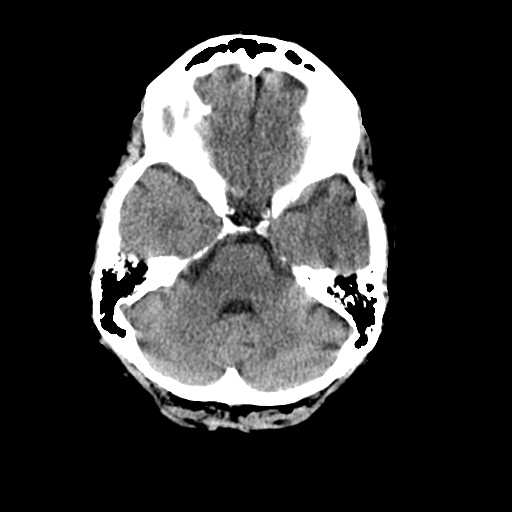
[im 12/32  brain]
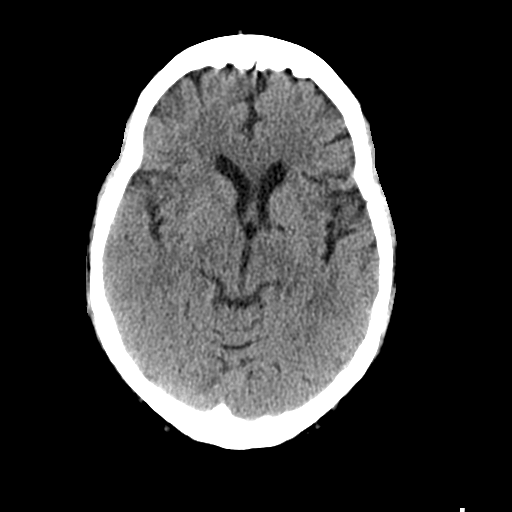
[im 16/32  brain]
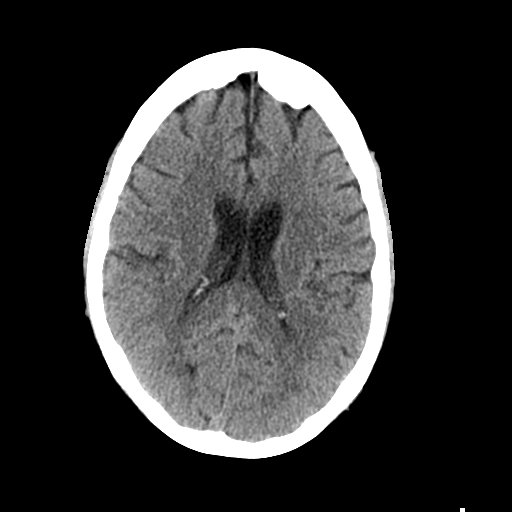
[im 20/32  brain]
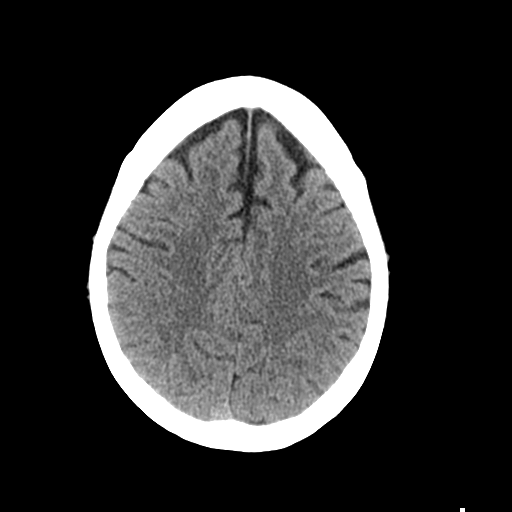
[im 20/32  bone]
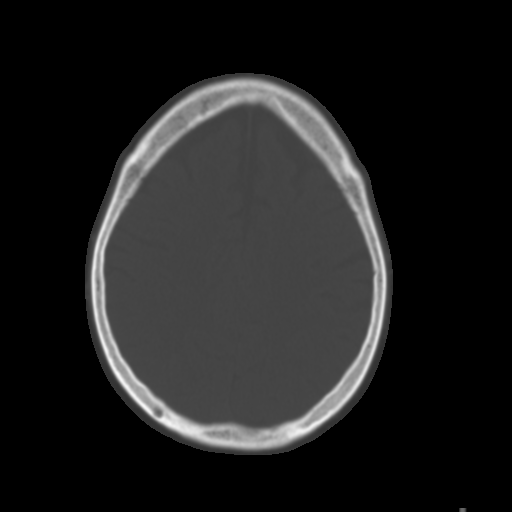
[im 24/32  brain]
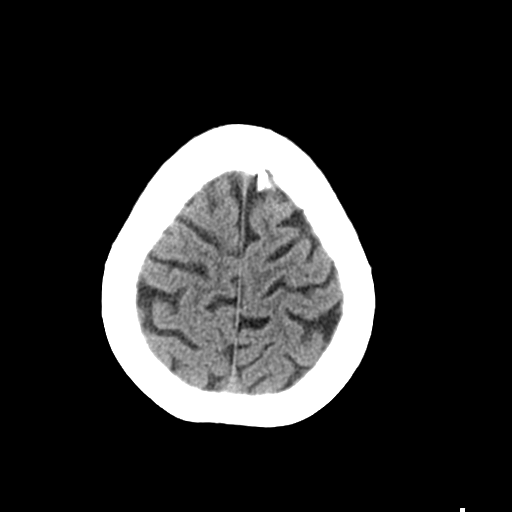
[im 28/32  brain]
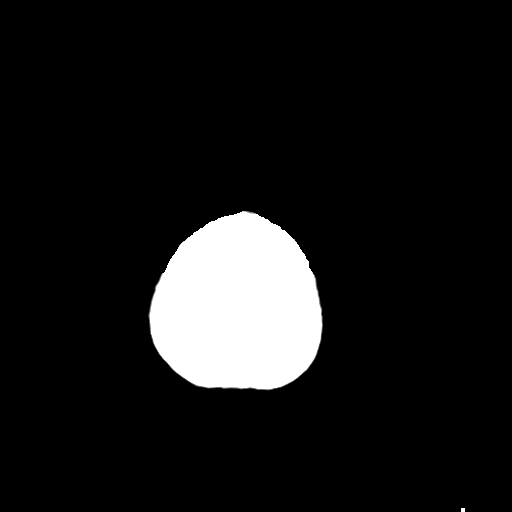

[Series 3: head bone · axial · 0.44mm/px · z∈[-76,-44]mm · 3 of 80 slices shown]
[im 8/80  bone]
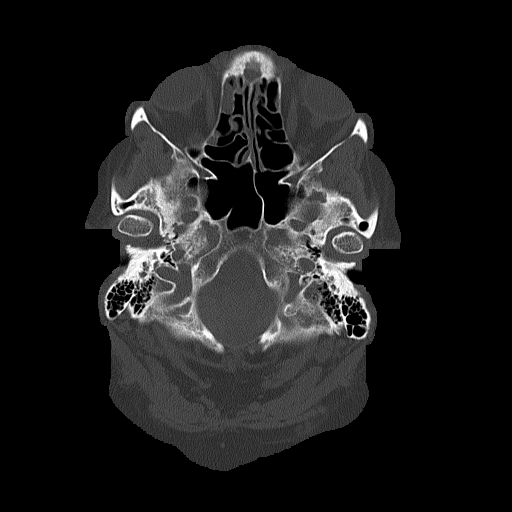
[im 16/80  bone]
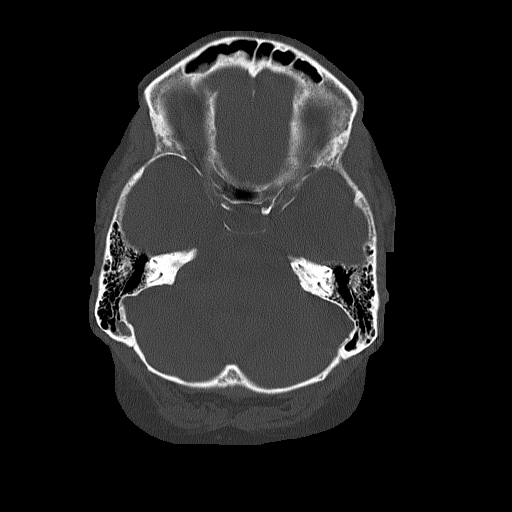
[im 24/80  bone]
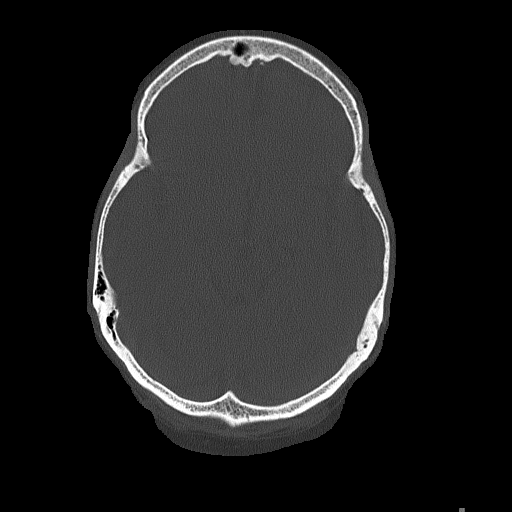

[Series 4: coronal soft · coronal · 0.35mm/px · 3 of 70 slices shown]
[im 24/70  brain]
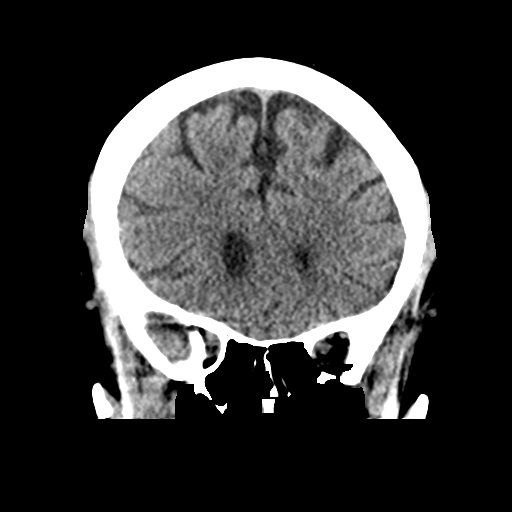
[im 31/70  brain]
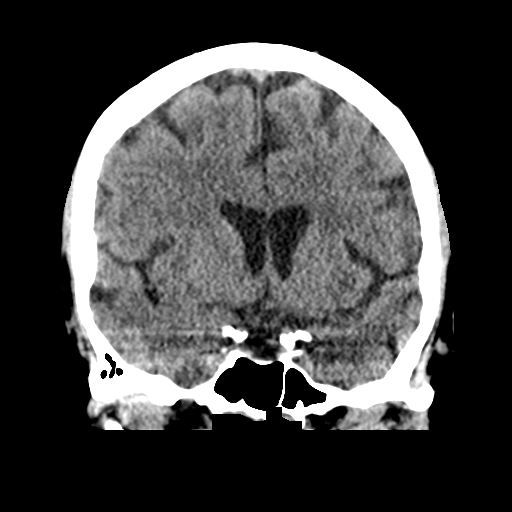
[im 39/70  brain]
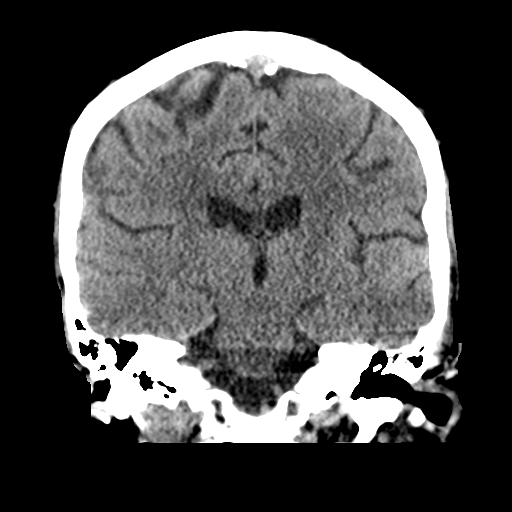

[Series 5: sagittal soft · sagittal · 0.32mm/px · 3 of 63 slices shown]
[im 21/63  brain]
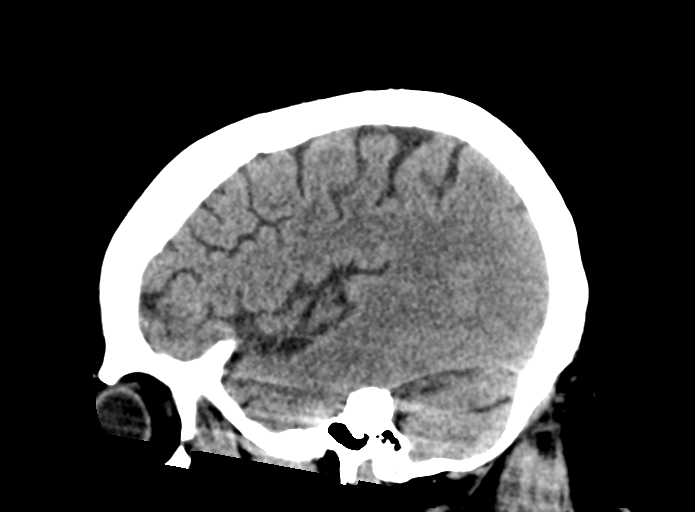
[im 32/63  brain]
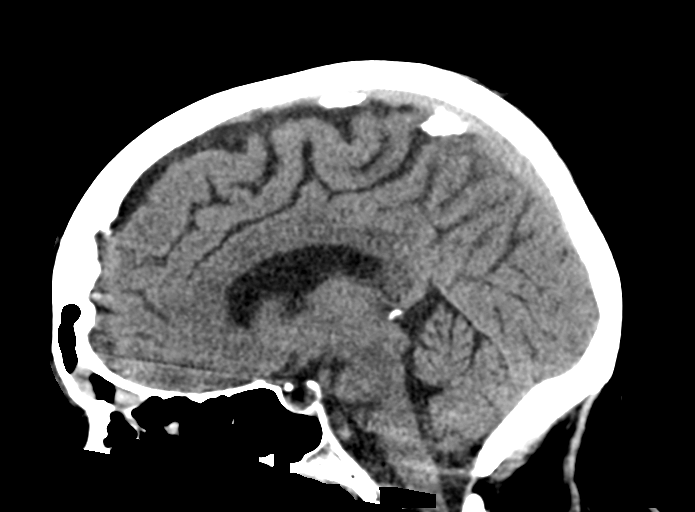
[im 42/63  brain]
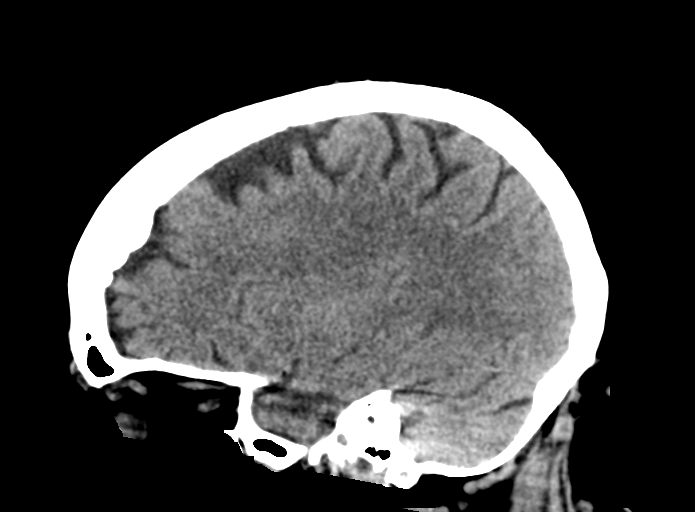

[16 of 47 positions shown; findings below may reference images not displayed]

FINDINGS: Brain: Focus of gliosis in the left cerebellar hemisphere likely
reflecting remote infarct no evidence of acute infarction,
hemorrhage, hydrocephalus, extra-axial collection or mass
lesion/mass effect. Symmetric prominence of the ventricles, cisterns
and sulci compatible with parenchymal volume loss. Patchy areas of
white matter hypoattenuation are most compatible with chronic
microvascular angiopathy.

Vascular: Atherosclerotic calcification of the carotid siphons.

Skull: Hyperostosis frontalis interna. No calvarial fracture or
suspicious osseous lesion. No scalp swelling or hematoma.

Sinuses/Orbits: Mural thickening in the right frontal sinus and
anterior right ethmoid air cells. Orbital structures are
unremarkable aside from prior lens extractions.

Other: None.
IMPRESSION: No acute intracranial abnormality.

Region of gliosis in the left cerebellar hemisphere, likely remote
infarct.

Volume loss and chronic white matter changes.

## 2020-09-26 IMAGING — DX LEFT TIBIA AND FIBULA - 2 VIEW
2 series · 2 of 2 positions shown · non-contrast
Comparison: Left knee radiograph dated 07/20/2019

CLINICAL DATA: 67-year-old female with fall and trauma to the left
lower extremity.

EXAM:
LEFT TIBIA AND FIBULA - 2 VIEW

[tibia ap]
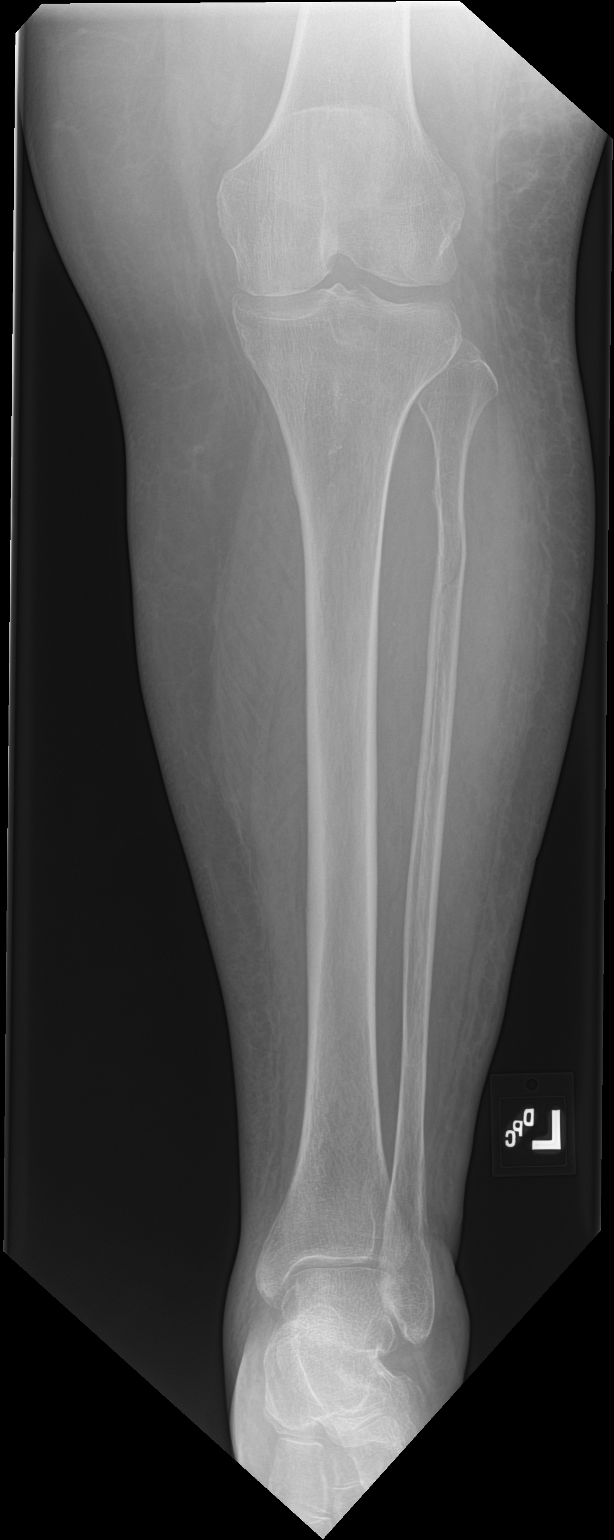

[tibia lat]
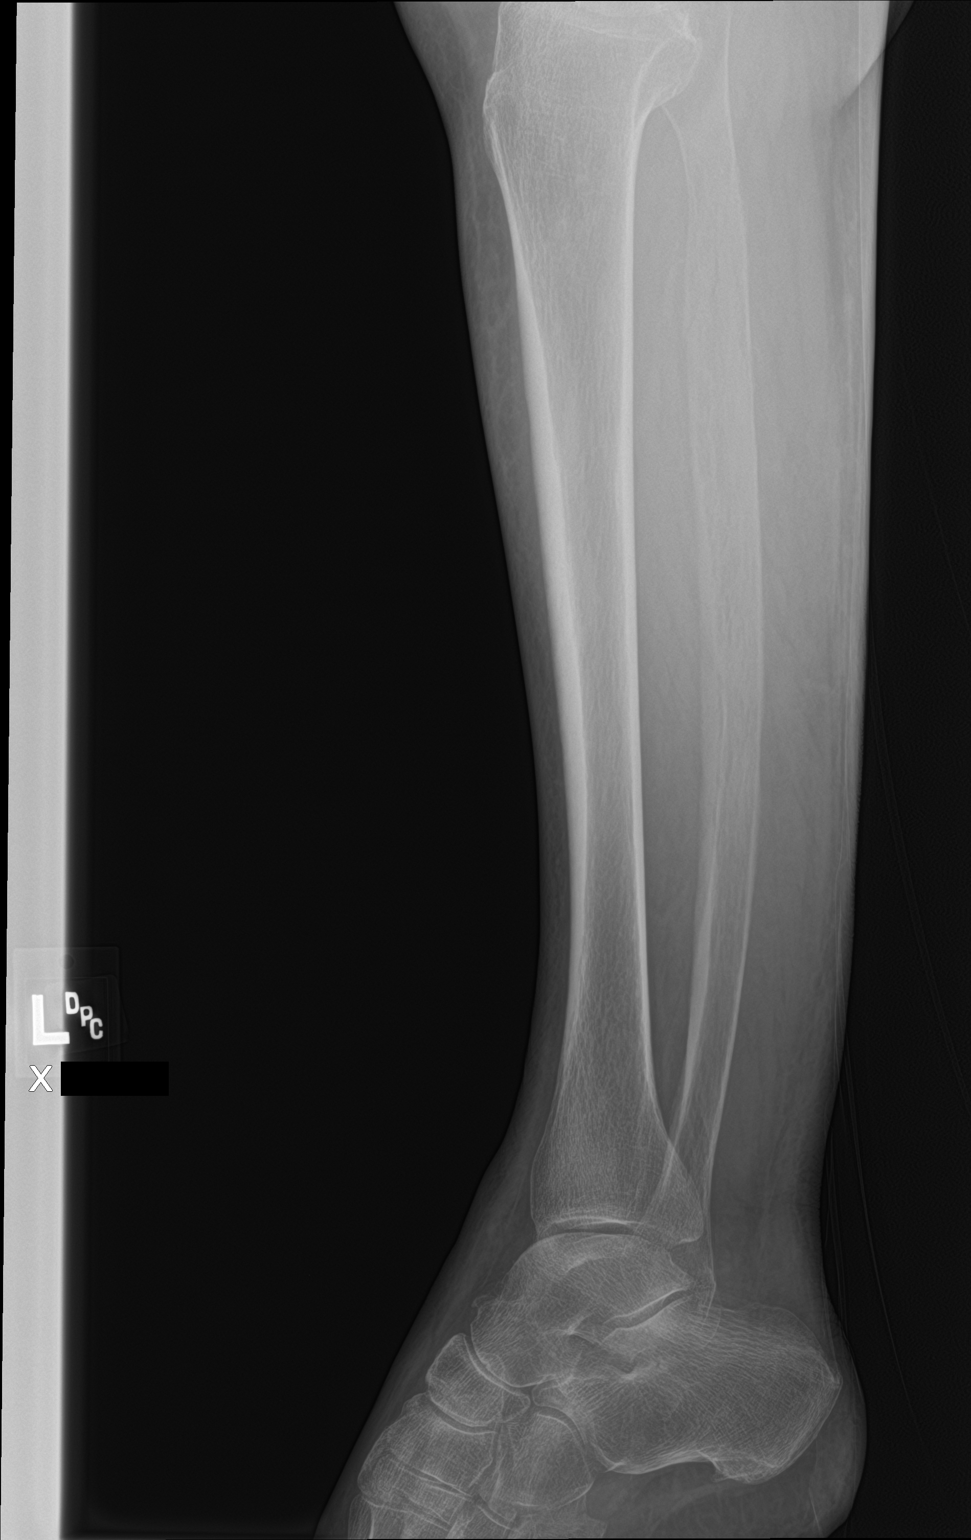

[2 of 2 positions shown; findings below may reference images not displayed]

FINDINGS: There is a nondisplaced oblique fracture of the proximal fibular
diaphysis. No other acute fracture identified. The bones are
osteopenic. There is no dislocation. The ankle mortise is intact.
There is diffuse subcutaneous edema.
IMPRESSION: Nondisplaced oblique fracture of the proximal fibular diaphysis.
